# Patient Record
Sex: Female | Born: 1950 | Race: White | Hispanic: No | Smoking: Former smoker
Health system: Southern US, Community
[De-identification: ages and names within clinical notes are randomized; demographics above are authoritative.]

## PROBLEM LIST (undated history)

## (undated) ENCOUNTER — Emergency Department (HOSPITAL_BASED_OUTPATIENT_CLINIC_OR_DEPARTMENT_OTHER): Admission: EM | Payer: Medicare Other | Source: Home / Self Care

## (undated) DIAGNOSIS — I2510 Atherosclerotic heart disease of native coronary artery without angina pectoris: Secondary | ICD-10-CM

## (undated) DIAGNOSIS — K5790 Diverticulosis of intestine, part unspecified, without perforation or abscess without bleeding: Secondary | ICD-10-CM

## (undated) DIAGNOSIS — G8929 Other chronic pain: Secondary | ICD-10-CM

## (undated) DIAGNOSIS — S83209A Unspecified tear of unspecified meniscus, current injury, unspecified knee, initial encounter: Secondary | ICD-10-CM

## (undated) DIAGNOSIS — M797 Fibromyalgia: Secondary | ICD-10-CM

## (undated) DIAGNOSIS — M549 Dorsalgia, unspecified: Secondary | ICD-10-CM

## (undated) DIAGNOSIS — M1990 Unspecified osteoarthritis, unspecified site: Secondary | ICD-10-CM

## (undated) DIAGNOSIS — M25511 Pain in right shoulder: Principal | ICD-10-CM

## (undated) DIAGNOSIS — K5792 Diverticulitis of intestine, part unspecified, without perforation or abscess without bleeding: Secondary | ICD-10-CM

## (undated) DIAGNOSIS — E785 Hyperlipidemia, unspecified: Secondary | ICD-10-CM

## (undated) DIAGNOSIS — I10 Essential (primary) hypertension: Secondary | ICD-10-CM

## (undated) DIAGNOSIS — K224 Dyskinesia of esophagus: Secondary | ICD-10-CM

## (undated) DIAGNOSIS — I700 Atherosclerosis of aorta: Secondary | ICD-10-CM

## (undated) HISTORY — DX: Unspecified osteoarthritis, unspecified site: M19.90

## (undated) HISTORY — DX: Other chronic pain: G89.29

## (undated) HISTORY — DX: Dyskinesia of esophagus: K22.4

## (undated) HISTORY — PX: LUMBAR FUSION: SHX111

## (undated) HISTORY — DX: Diverticulitis of intestine, part unspecified, without perforation or abscess without bleeding: K57.92

## (undated) HISTORY — DX: Unspecified tear of unspecified meniscus, current injury, unspecified knee, initial encounter: S83.209A

## (undated) HISTORY — DX: Dorsalgia, unspecified: M54.9

## (undated) HISTORY — DX: Atherosclerosis of aorta: I70.0

## (undated) HISTORY — PX: TONSILLECTOMY AND ADENOIDECTOMY: SUR1326

## (undated) HISTORY — PX: ABDOMINAL HYSTERECTOMY: SHX81

## (undated) HISTORY — DX: Fibromyalgia: M79.7

## (undated) HISTORY — DX: Atherosclerotic heart disease of native coronary artery without angina pectoris: I25.10

## (undated) HISTORY — DX: Hyperlipidemia, unspecified: E78.5

## (undated) HISTORY — DX: Pain in right shoulder: M25.511

## (undated) HISTORY — PX: KNEE SURGERY: SHX244

## (undated) HISTORY — DX: Diverticulosis of intestine, part unspecified, without perforation or abscess without bleeding: K57.90

## (undated) HISTORY — PX: SHOULDER ARTHROSCOPY: SHX128

## (undated) HISTORY — DX: Essential (primary) hypertension: I10

---

## 2015-12-12 DIAGNOSIS — M4316 Spondylolisthesis, lumbar region: Secondary | ICD-10-CM | POA: Diagnosis not present

## 2016-01-17 DIAGNOSIS — M4316 Spondylolisthesis, lumbar region: Secondary | ICD-10-CM | POA: Diagnosis not present

## 2016-01-17 DIAGNOSIS — Z981 Arthrodesis status: Secondary | ICD-10-CM | POA: Diagnosis not present

## 2016-01-21 DIAGNOSIS — Z981 Arthrodesis status: Secondary | ICD-10-CM | POA: Diagnosis not present

## 2016-01-21 DIAGNOSIS — M4316 Spondylolisthesis, lumbar region: Secondary | ICD-10-CM | POA: Diagnosis not present

## 2016-01-30 DIAGNOSIS — J45909 Unspecified asthma, uncomplicated: Secondary | ICD-10-CM | POA: Diagnosis not present

## 2016-01-30 DIAGNOSIS — F341 Dysthymic disorder: Secondary | ICD-10-CM | POA: Diagnosis not present

## 2016-01-30 DIAGNOSIS — M4326 Fusion of spine, lumbar region: Secondary | ICD-10-CM | POA: Diagnosis not present

## 2016-02-25 DIAGNOSIS — M545 Low back pain: Secondary | ICD-10-CM | POA: Diagnosis not present

## 2016-02-25 DIAGNOSIS — M47816 Spondylosis without myelopathy or radiculopathy, lumbar region: Secondary | ICD-10-CM | POA: Diagnosis not present

## 2016-03-05 DIAGNOSIS — F341 Dysthymic disorder: Secondary | ICD-10-CM | POA: Diagnosis not present

## 2016-03-05 DIAGNOSIS — M4316 Spondylolisthesis, lumbar region: Secondary | ICD-10-CM | POA: Diagnosis not present

## 2016-03-25 DIAGNOSIS — M5136 Other intervertebral disc degeneration, lumbar region: Secondary | ICD-10-CM | POA: Diagnosis not present

## 2016-04-10 DIAGNOSIS — Z01812 Encounter for preprocedural laboratory examination: Secondary | ICD-10-CM | POA: Diagnosis not present

## 2016-04-15 DIAGNOSIS — M5126 Other intervertebral disc displacement, lumbar region: Secondary | ICD-10-CM | POA: Diagnosis not present

## 2016-04-15 DIAGNOSIS — Z981 Arthrodesis status: Secondary | ICD-10-CM | POA: Diagnosis not present

## 2016-04-15 DIAGNOSIS — M47816 Spondylosis without myelopathy or radiculopathy, lumbar region: Secondary | ICD-10-CM | POA: Diagnosis not present

## 2016-04-15 DIAGNOSIS — M5136 Other intervertebral disc degeneration, lumbar region: Secondary | ICD-10-CM | POA: Diagnosis not present

## 2016-04-22 DIAGNOSIS — M5136 Other intervertebral disc degeneration, lumbar region: Secondary | ICD-10-CM | POA: Diagnosis not present

## 2016-04-22 DIAGNOSIS — M545 Low back pain: Secondary | ICD-10-CM | POA: Diagnosis not present

## 2016-04-30 DIAGNOSIS — R5383 Other fatigue: Secondary | ICD-10-CM | POA: Diagnosis not present

## 2016-04-30 DIAGNOSIS — M5416 Radiculopathy, lumbar region: Secondary | ICD-10-CM | POA: Diagnosis not present

## 2016-05-01 DIAGNOSIS — R5383 Other fatigue: Secondary | ICD-10-CM | POA: Diagnosis not present

## 2016-05-01 DIAGNOSIS — Z13220 Encounter for screening for lipoid disorders: Secondary | ICD-10-CM | POA: Diagnosis not present

## 2016-05-01 DIAGNOSIS — E785 Hyperlipidemia, unspecified: Secondary | ICD-10-CM | POA: Diagnosis not present

## 2016-05-02 DIAGNOSIS — R21 Rash and other nonspecific skin eruption: Secondary | ICD-10-CM | POA: Diagnosis not present

## 2016-05-26 DIAGNOSIS — N959 Unspecified menopausal and perimenopausal disorder: Secondary | ICD-10-CM | POA: Diagnosis not present

## 2016-05-26 DIAGNOSIS — E785 Hyperlipidemia, unspecified: Secondary | ICD-10-CM | POA: Diagnosis not present

## 2016-05-26 DIAGNOSIS — R5383 Other fatigue: Secondary | ICD-10-CM | POA: Diagnosis not present

## 2016-05-26 DIAGNOSIS — M5416 Radiculopathy, lumbar region: Secondary | ICD-10-CM | POA: Diagnosis not present

## 2016-07-04 DIAGNOSIS — Z13220 Encounter for screening for lipoid disorders: Secondary | ICD-10-CM | POA: Diagnosis not present

## 2016-07-04 DIAGNOSIS — N959 Unspecified menopausal and perimenopausal disorder: Secondary | ICD-10-CM | POA: Diagnosis not present

## 2016-07-04 DIAGNOSIS — M8589 Other specified disorders of bone density and structure, multiple sites: Secondary | ICD-10-CM | POA: Diagnosis not present

## 2016-07-04 DIAGNOSIS — E559 Vitamin D deficiency, unspecified: Secondary | ICD-10-CM | POA: Diagnosis not present

## 2016-07-04 DIAGNOSIS — E785 Hyperlipidemia, unspecified: Secondary | ICD-10-CM | POA: Diagnosis not present

## 2016-07-04 DIAGNOSIS — Z780 Asymptomatic menopausal state: Secondary | ICD-10-CM | POA: Diagnosis not present

## 2016-07-07 DIAGNOSIS — E559 Vitamin D deficiency, unspecified: Secondary | ICD-10-CM | POA: Diagnosis not present

## 2016-07-07 DIAGNOSIS — M4326 Fusion of spine, lumbar region: Secondary | ICD-10-CM | POA: Diagnosis not present

## 2016-07-07 DIAGNOSIS — F341 Dysthymic disorder: Secondary | ICD-10-CM | POA: Diagnosis not present

## 2016-09-01 DIAGNOSIS — M4316 Spondylolisthesis, lumbar region: Secondary | ICD-10-CM | POA: Diagnosis not present

## 2016-09-01 DIAGNOSIS — K219 Gastro-esophageal reflux disease without esophagitis: Secondary | ICD-10-CM | POA: Diagnosis not present

## 2016-09-23 DIAGNOSIS — M5416 Radiculopathy, lumbar region: Secondary | ICD-10-CM | POA: Diagnosis not present

## 2016-09-23 DIAGNOSIS — M797 Fibromyalgia: Secondary | ICD-10-CM | POA: Diagnosis not present

## 2016-09-23 DIAGNOSIS — M4326 Fusion of spine, lumbar region: Secondary | ICD-10-CM | POA: Diagnosis not present

## 2016-10-13 DIAGNOSIS — F4320 Adjustment disorder, unspecified: Secondary | ICD-10-CM | POA: Diagnosis not present

## 2016-10-29 DIAGNOSIS — F4320 Adjustment disorder, unspecified: Secondary | ICD-10-CM | POA: Diagnosis not present

## 2016-11-11 DIAGNOSIS — M5416 Radiculopathy, lumbar region: Secondary | ICD-10-CM | POA: Diagnosis not present

## 2016-11-11 DIAGNOSIS — B351 Tinea unguium: Secondary | ICD-10-CM | POA: Diagnosis not present

## 2016-12-01 DIAGNOSIS — S42201A Unspecified fracture of upper end of right humerus, initial encounter for closed fracture: Secondary | ICD-10-CM | POA: Diagnosis not present

## 2016-12-01 DIAGNOSIS — Z90710 Acquired absence of both cervix and uterus: Secondary | ICD-10-CM | POA: Diagnosis not present

## 2016-12-01 DIAGNOSIS — I739 Peripheral vascular disease, unspecified: Secondary | ICD-10-CM | POA: Diagnosis not present

## 2016-12-01 DIAGNOSIS — Z888 Allergy status to other drugs, medicaments and biological substances status: Secondary | ICD-10-CM | POA: Diagnosis not present

## 2016-12-01 DIAGNOSIS — S40911A Unspecified superficial injury of right shoulder, initial encounter: Secondary | ICD-10-CM | POA: Diagnosis not present

## 2016-12-01 DIAGNOSIS — Z79891 Long term (current) use of opiate analgesic: Secondary | ICD-10-CM | POA: Diagnosis not present

## 2016-12-01 DIAGNOSIS — K5792 Diverticulitis of intestine, part unspecified, without perforation or abscess without bleeding: Secondary | ICD-10-CM | POA: Diagnosis not present

## 2016-12-01 DIAGNOSIS — W000XXA Fall on same level due to ice and snow, initial encounter: Secondary | ICD-10-CM | POA: Diagnosis not present

## 2016-12-01 DIAGNOSIS — M797 Fibromyalgia: Secondary | ICD-10-CM | POA: Diagnosis not present

## 2016-12-01 DIAGNOSIS — W009XXA Unspecified fall due to ice and snow, initial encounter: Secondary | ICD-10-CM | POA: Diagnosis not present

## 2016-12-01 DIAGNOSIS — G8929 Other chronic pain: Secondary | ICD-10-CM | POA: Diagnosis not present

## 2016-12-01 DIAGNOSIS — Z79899 Other long term (current) drug therapy: Secondary | ICD-10-CM | POA: Diagnosis not present

## 2016-12-01 DIAGNOSIS — E7800 Pure hypercholesterolemia, unspecified: Secondary | ICD-10-CM | POA: Diagnosis not present

## 2016-12-01 DIAGNOSIS — F17200 Nicotine dependence, unspecified, uncomplicated: Secondary | ICD-10-CM | POA: Diagnosis not present

## 2016-12-03 DIAGNOSIS — S42351A Displaced comminuted fracture of shaft of humerus, right arm, initial encounter for closed fracture: Secondary | ICD-10-CM | POA: Diagnosis not present

## 2016-12-03 DIAGNOSIS — S42291A Other displaced fracture of upper end of right humerus, initial encounter for closed fracture: Secondary | ICD-10-CM | POA: Diagnosis not present

## 2016-12-03 DIAGNOSIS — M79621 Pain in right upper arm: Secondary | ICD-10-CM | POA: Diagnosis not present

## 2016-12-04 DIAGNOSIS — S42211A Unspecified displaced fracture of surgical neck of right humerus, initial encounter for closed fracture: Secondary | ICD-10-CM | POA: Diagnosis not present

## 2016-12-04 DIAGNOSIS — G8918 Other acute postprocedural pain: Secondary | ICD-10-CM | POA: Diagnosis not present

## 2016-12-04 DIAGNOSIS — J45909 Unspecified asthma, uncomplicated: Secondary | ICD-10-CM | POA: Diagnosis not present

## 2016-12-04 DIAGNOSIS — Z888 Allergy status to other drugs, medicaments and biological substances status: Secondary | ICD-10-CM | POA: Diagnosis not present

## 2016-12-04 DIAGNOSIS — Z87891 Personal history of nicotine dependence: Secondary | ICD-10-CM | POA: Diagnosis not present

## 2016-12-04 DIAGNOSIS — S42201A Unspecified fracture of upper end of right humerus, initial encounter for closed fracture: Secondary | ICD-10-CM | POA: Diagnosis not present

## 2016-12-15 DIAGNOSIS — S4291XA Fracture of right shoulder girdle, part unspecified, initial encounter for closed fracture: Secondary | ICD-10-CM | POA: Diagnosis not present

## 2016-12-15 DIAGNOSIS — S42301A Unspecified fracture of shaft of humerus, right arm, initial encounter for closed fracture: Secondary | ICD-10-CM | POA: Diagnosis not present

## 2016-12-22 DIAGNOSIS — Z01818 Encounter for other preprocedural examination: Secondary | ICD-10-CM | POA: Diagnosis not present

## 2016-12-24 DIAGNOSIS — S42351A Displaced comminuted fracture of shaft of humerus, right arm, initial encounter for closed fracture: Secondary | ICD-10-CM | POA: Diagnosis not present

## 2016-12-24 DIAGNOSIS — E559 Vitamin D deficiency, unspecified: Secondary | ICD-10-CM | POA: Diagnosis not present

## 2016-12-25 DIAGNOSIS — E7800 Pure hypercholesterolemia, unspecified: Secondary | ICD-10-CM | POA: Diagnosis not present

## 2016-12-25 DIAGNOSIS — Z888 Allergy status to other drugs, medicaments and biological substances status: Secondary | ICD-10-CM | POA: Diagnosis not present

## 2016-12-25 DIAGNOSIS — Z8541 Personal history of malignant neoplasm of cervix uteri: Secondary | ICD-10-CM | POA: Diagnosis not present

## 2016-12-25 DIAGNOSIS — Z87891 Personal history of nicotine dependence: Secondary | ICD-10-CM | POA: Diagnosis not present

## 2016-12-25 DIAGNOSIS — T8484XA Pain due to internal orthopedic prosthetic devices, implants and grafts, initial encounter: Secondary | ICD-10-CM | POA: Diagnosis not present

## 2016-12-25 DIAGNOSIS — I10 Essential (primary) hypertension: Secondary | ICD-10-CM | POA: Diagnosis not present

## 2016-12-25 DIAGNOSIS — J45909 Unspecified asthma, uncomplicated: Secondary | ICD-10-CM | POA: Diagnosis not present

## 2016-12-25 DIAGNOSIS — Z472 Encounter for removal of internal fixation device: Secondary | ICD-10-CM | POA: Diagnosis not present

## 2016-12-31 DIAGNOSIS — S42351A Displaced comminuted fracture of shaft of humerus, right arm, initial encounter for closed fracture: Secondary | ICD-10-CM | POA: Diagnosis not present

## 2017-01-19 DIAGNOSIS — M4316 Spondylolisthesis, lumbar region: Secondary | ICD-10-CM | POA: Diagnosis not present

## 2017-01-19 DIAGNOSIS — H9193 Unspecified hearing loss, bilateral: Secondary | ICD-10-CM | POA: Diagnosis not present

## 2017-01-19 DIAGNOSIS — J3489 Other specified disorders of nose and nasal sinuses: Secondary | ICD-10-CM | POA: Diagnosis not present

## 2017-01-29 DIAGNOSIS — E7800 Pure hypercholesterolemia, unspecified: Secondary | ICD-10-CM | POA: Diagnosis not present

## 2017-01-29 DIAGNOSIS — Z8589 Personal history of malignant neoplasm of other organs and systems: Secondary | ICD-10-CM | POA: Diagnosis not present

## 2017-01-29 DIAGNOSIS — T84498A Other mechanical complication of other internal orthopedic devices, implants and grafts, initial encounter: Secondary | ICD-10-CM | POA: Diagnosis not present

## 2017-01-29 DIAGNOSIS — S42291A Other displaced fracture of upper end of right humerus, initial encounter for closed fracture: Secondary | ICD-10-CM | POA: Diagnosis not present

## 2017-01-29 DIAGNOSIS — Z87891 Personal history of nicotine dependence: Secondary | ICD-10-CM | POA: Diagnosis not present

## 2017-01-29 DIAGNOSIS — Z888 Allergy status to other drugs, medicaments and biological substances status: Secondary | ICD-10-CM | POA: Diagnosis not present

## 2017-01-29 DIAGNOSIS — J45909 Unspecified asthma, uncomplicated: Secondary | ICD-10-CM | POA: Diagnosis not present

## 2017-01-29 DIAGNOSIS — T8484XA Pain due to internal orthopedic prosthetic devices, implants and grafts, initial encounter: Secondary | ICD-10-CM | POA: Diagnosis not present

## 2017-02-13 DIAGNOSIS — J45909 Unspecified asthma, uncomplicated: Secondary | ICD-10-CM | POA: Diagnosis not present

## 2017-02-13 DIAGNOSIS — S42351A Displaced comminuted fracture of shaft of humerus, right arm, initial encounter for closed fracture: Secondary | ICD-10-CM | POA: Diagnosis not present

## 2017-02-13 DIAGNOSIS — E785 Hyperlipidemia, unspecified: Secondary | ICD-10-CM | POA: Diagnosis not present

## 2017-02-13 DIAGNOSIS — M4316 Spondylolisthesis, lumbar region: Secondary | ICD-10-CM | POA: Diagnosis not present

## 2017-02-17 DIAGNOSIS — S42353A Displaced comminuted fracture of shaft of humerus, unspecified arm, initial encounter for closed fracture: Secondary | ICD-10-CM | POA: Diagnosis not present

## 2017-02-25 DIAGNOSIS — Z1231 Encounter for screening mammogram for malignant neoplasm of breast: Secondary | ICD-10-CM | POA: Diagnosis not present

## 2017-02-26 DIAGNOSIS — S42353A Displaced comminuted fracture of shaft of humerus, unspecified arm, initial encounter for closed fracture: Secondary | ICD-10-CM | POA: Diagnosis not present

## 2017-02-27 DIAGNOSIS — M4326 Fusion of spine, lumbar region: Secondary | ICD-10-CM | POA: Diagnosis not present

## 2017-02-27 DIAGNOSIS — S42351A Displaced comminuted fracture of shaft of humerus, right arm, initial encounter for closed fracture: Secondary | ICD-10-CM | POA: Diagnosis not present

## 2017-03-23 DIAGNOSIS — M4326 Fusion of spine, lumbar region: Secondary | ICD-10-CM | POA: Diagnosis not present

## 2017-03-23 DIAGNOSIS — H5203 Hypermetropia, bilateral: Secondary | ICD-10-CM | POA: Diagnosis not present

## 2017-03-23 DIAGNOSIS — F341 Dysthymic disorder: Secondary | ICD-10-CM | POA: Diagnosis not present

## 2017-03-23 DIAGNOSIS — H2513 Age-related nuclear cataract, bilateral: Secondary | ICD-10-CM | POA: Diagnosis not present

## 2017-03-23 DIAGNOSIS — S42351A Displaced comminuted fracture of shaft of humerus, right arm, initial encounter for closed fracture: Secondary | ICD-10-CM | POA: Diagnosis not present

## 2017-03-23 DIAGNOSIS — H524 Presbyopia: Secondary | ICD-10-CM | POA: Diagnosis not present

## 2017-04-17 ENCOUNTER — Telehealth: Payer: Self-pay | Admitting: *Deleted

## 2017-04-17 NOTE — Telephone Encounter (Signed)
PreVisit Call attempted. Pt states she will call back in 1 hr.

## 2017-04-20 ENCOUNTER — Ambulatory Visit: Payer: Self-pay | Admitting: Family Medicine

## 2017-04-27 ENCOUNTER — Ambulatory Visit: Payer: Self-pay | Admitting: Family Medicine

## 2017-05-18 ENCOUNTER — Ambulatory Visit: Payer: Self-pay | Admitting: Family Medicine

## 2017-05-22 ENCOUNTER — Ambulatory Visit: Payer: Medicare Other | Admitting: Family Medicine

## 2017-05-26 ENCOUNTER — Encounter: Payer: Self-pay | Admitting: Family Medicine

## 2017-05-26 ENCOUNTER — Ambulatory Visit (INDEPENDENT_AMBULATORY_CARE_PROVIDER_SITE_OTHER): Payer: Medicare Other | Admitting: Family Medicine

## 2017-05-26 VITALS — BP 118/72 | HR 63 | Temp 98.5°F | Ht 64.0 in | Wt 112.4 lb

## 2017-05-26 DIAGNOSIS — M25511 Pain in right shoulder: Secondary | ICD-10-CM

## 2017-05-26 DIAGNOSIS — E7800 Pure hypercholesterolemia, unspecified: Secondary | ICD-10-CM

## 2017-05-26 DIAGNOSIS — G8929 Other chronic pain: Secondary | ICD-10-CM | POA: Diagnosis not present

## 2017-05-26 DIAGNOSIS — W57XXXA Bitten or stung by nonvenomous insect and other nonvenomous arthropods, initial encounter: Secondary | ICD-10-CM

## 2017-05-26 MED ORDER — TRIAMCINOLONE ACETONIDE 0.5 % EX OINT: 1.0000 "application " | TOPICAL_OINTMENT | Freq: Two times a day (BID) | CUTANEOUS | 0 refills | Status: DC

## 2017-05-26 MED ORDER — HYDROCODONE-ACETAMINOPHEN 10-325 MG PO TABS: 1.0000 | ORAL_TABLET | Freq: Four times a day (QID) | ORAL | 0 refills | Status: DC | PRN

## 2017-05-26 NOTE — Progress Notes (Signed)
Patricia Underwood is a 66 y.o. female is here to [facility] CARE.   Patient Care Team: Helane Rima, DO as PCP - General (Family Medicine)   History of Present Illness:   Patricia Underwood, CMA, acting as scribe for [name].  HPI: See assessment and plan for problem based charting.  Health Maintenance Due  Topic Date Due  . Hepatitis C Screening  1951-01-07  . TETANUS/TDAP  01/02/1970  . MAMMOGRAM  01/02/2001  . COLONOSCOPY  01/02/2001  . DEXA SCAN  01/03/2016  . PNA vac Low Risk Adult (1 of 2 - PCV13) 01/03/2016   Depression screen PHQ 2/9 05/26/2017  Decreased Interest 0  Down, Depressed, Hopeless 0  PHQ - 2 Score 0   PMHx, SurgHx, SocialHx, Medications, and Allergies were reviewed in the Visit Navigator and updated as appropriate.   Past Medical History:  Diagnosis Date  . Chronic right shoulder pain 05/30/2017  . HLD (hyperlipidemia) 05/30/2017   History reviewed. No pertinent surgical history. History reviewed. No pertinent family history.   Social History  Substance Use Topics  . Smoking status: Former Games developer  . Smokeless tobacco: Former Neurosurgeon  . Alcohol use 4.2 oz/week    7 Glasses of wine per week   Current Medications and Allergies:   .  albuterol (PROVENTIL HFA;VENTOLIN HFA) 108 (90 Base) MCG/ACT inhaler, Inhale 1 puff into the lungs as needed for wheezing or shortness of breath., Disp: , Rfl:  .  atorvastatin (LIPITOR) 40 MG tablet, Take 40 mg by mouth daily., Disp: , Rfl:  .  cholecalciferol (VITAMIN D) 1000 units tablet, Take 2,000 Units by mouth daily., Disp: , Rfl:   Allergies  Allergen Reactions  . Flexeril [Cyclobenzaprine] Other (See Comments)    Patient unsure of reaction  . Naproxen Other (See Comments)    Patient unsure of reaction.   Review of Systems:   Pertinent items are noted in the HPI. Otherwise, ROS is negative.  Vitals:   Vitals:   05/26/17 1629  BP: 118/72  Pulse: 63  Temp: 98.5 F (36.9 C)  TempSrc: Oral  SpO2: 97%  Weight:  112 lb 6.4 oz (51 kg)  Height: 5\' 4"  (1.626 m)     Body mass index is 19.29 kg/m.  Physical Exam:   Physical Exam  Constitutional: She appears well-developed and well-nourished. No distress.  HENT:  Head: Normocephalic and atraumatic.  Eyes: Pupils are equal, round, and reactive to light. EOM are normal.  Neck: Normal range of motion. Neck supple.  Cardiovascular: Normal rate, regular rhythm, normal heart sounds and intact distal pulses.   Pulmonary/Chest: Effort normal.  Abdominal: Soft.  Skin: Skin is warm.  Multiple excoriated bug bites on her forearms. No signs of infection.  Psychiatric: She has a normal mood and affect. Her behavior is normal.  Nursing note and vitals reviewed.  Assessment and Plan:   [name] was seen today for establish care.  Diagnoses and all orders for this visit:  Chronic right shoulder pain Comments:  Patient complains of right shoulder pain. The symptoms began several years ago. Pain is located diffusely throughout the shoulder. Discomfort is described as aching and throbbing. Symptoms are exacerbated by repetitive movements, overhead movements and lying on the shoulder. Evaluation to date: previously followed by Orthopedics and would like another referral. Therapy to date includes: opioids which are effective.  Controlled.  No signs of complications, medication side effects, or red flags.    Continue current regimen. REVIEW OF PAIN MEDICATION CONTRACT WITH PATIENT:  We had a long discussion about chronic pain medications, the risks, benefits, potential side effects of medication.  We reviewed her pain contract in detail.  The patient understands the contract and is willing to abide by it.  She has signed this and we will scan it into her record. She understands that she must take the medication as prescribed, she cannot obtain narcotic medication from any other source, she is not to request refills early, she is not to escalate the dose without a  discussion.    Orders: -     AMB referral to orthopedics -     HYDROcodone-acetaminophen (NORCO) 10-325 MG tablet; Take 1 tablet by mouth 4 (four) times daily as needed. -     HYDROcodone-acetaminophen (NORCO) 10-325 MG tablet; Take 1 tablet by mouth 4 (four) times daily as needed. -     HYDROcodone-acetaminophen (NORCO) 10-325 MG tablet; Take 1 tablet by mouth 4 (four) times daily as needed.  Bug bite, initial encounter -     triamcinolone ointment (KENALOG) 0.5 %; Apply 1 application topically 2 (two) times daily.  Pure hypercholesterolemia Comments: No signs of complications, medication side effects, or red flags.  Continue current regimen.     . Reviewed expectations re: course of current medical issues. . Discussed self-management of symptoms. . Outlined signs and symptoms indicating need for more acute intervention. . Patient verbalized understanding and all questions were answered. [name] Health Maintenance issues including appropriate healthy diet, exercise, and smoking avoidance were discussed with patient. . See orders for this visit as documented in the electronic medical record. . Patient received an After Visit Summary.  CMA served as Neurosurgeon during this visit. History, Physical, and Plan performed by medical provider. The above documentation has been reviewed and is accurate and complete. Helane Rima, D.O.  Records requested if needed. Time spent with the patient: 30 minutes, of which >50% was spent in obtaining information about her symptoms, reviewing her previous labs, evaluations, and treatments, counseling her about her condition (please see the discussed topics above), and developing a plan to further investigate it; she had a number of questions which I addressed. We'll request records and review.  Helane Rima, DO [place] [place] 05/31/2017  Future Appointments Date Time Provider Department Center  08/26/2017 10:00 AM Helane Rima, DO [facility] None

## 2017-05-30 ENCOUNTER — Encounter: Payer: Self-pay | Admitting: Family Medicine

## 2017-05-30 DIAGNOSIS — G8929 Other chronic pain: Secondary | ICD-10-CM | POA: Insufficient documentation

## 2017-05-30 DIAGNOSIS — E785 Hyperlipidemia, unspecified: Secondary | ICD-10-CM | POA: Insufficient documentation

## 2017-05-30 DIAGNOSIS — M25511 Pain in right shoulder: Principal | ICD-10-CM

## 2017-05-30 HISTORY — DX: Hyperlipidemia, unspecified: E78.5

## 2017-05-30 HISTORY — DX: Other chronic pain: G89.29

## 2017-05-31 ENCOUNTER — Encounter: Payer: Self-pay | Admitting: Family Medicine

## 2017-06-26 ENCOUNTER — Ambulatory Visit: Payer: Medicare Other | Admitting: Family Medicine

## 2017-06-29 ENCOUNTER — Encounter: Payer: Self-pay | Admitting: Family Medicine

## 2017-06-29 ENCOUNTER — Ambulatory Visit (INDEPENDENT_AMBULATORY_CARE_PROVIDER_SITE_OTHER): Payer: Medicare Other | Admitting: Family Medicine

## 2017-06-29 VITALS — BP 142/78 | HR 64 | Temp 98.1°F | Ht 64.0 in | Wt 120.6 lb

## 2017-06-29 DIAGNOSIS — E538 Deficiency of other specified B group vitamins: Secondary | ICD-10-CM

## 2017-06-29 DIAGNOSIS — E559 Vitamin D deficiency, unspecified: Secondary | ICD-10-CM | POA: Diagnosis not present

## 2017-06-29 DIAGNOSIS — G4700 Insomnia, unspecified: Secondary | ICD-10-CM | POA: Diagnosis not present

## 2017-06-29 DIAGNOSIS — Z13220 Encounter for screening for lipoid disorders: Secondary | ICD-10-CM

## 2017-06-29 DIAGNOSIS — R5383 Other fatigue: Secondary | ICD-10-CM

## 2017-06-29 DIAGNOSIS — R7989 Other specified abnormal findings of blood chemistry: Secondary | ICD-10-CM

## 2017-06-29 MED ORDER — GABAPENTIN 100 MG PO CAPS: 100.0000 mg | ORAL_CAPSULE | Freq: Every day | ORAL | 0 refills | Status: DC

## 2017-06-29 NOTE — Progress Notes (Signed)
Patricia Underwood is a 66 y.o. female is here to [facility] CARE.   Patient Care Team: Helane Rima, DO as PCP - General (Family Medicine)   History of Present Illness:   [name] CMA acting as scribe for [name].  HPI: Patient comes in today for follow up of her fatigue. See Assessment and Plan section for Problem Based Charting of further issues discussed today.  Health Maintenance Due  Topic Date Due  . Hepatitis C Screening  18-Jul-1951  . TETANUS/TDAP  01/02/1970  . MAMMOGRAM  01/02/2001  . COLONOSCOPY  01/02/2001  . DEXA SCAN  01/03/2016  . PNA vac Low Risk Adult (1 of 2 - PCV13) 01/03/2016  . INFLUENZA VACCINE  06/10/2017   Depression screen PHQ 2/9 05/26/2017  Decreased Interest 0  Down, Depressed, Hopeless 0  PHQ - 2 Score 0   PMHx, SurgHx, SocialHx, Medications, and Allergies were reviewed in the Visit Navigator and updated as appropriate.   Past Medical History:  Diagnosis Date  . Chronic right shoulder pain 05/30/2017  . HLD (hyperlipidemia) 05/30/2017   No past surgical history on file. No family history on file.   Social History  Substance Use Topics  . Smoking status: Former Games developer  . Smokeless tobacco: Former Neurosurgeon  . Alcohol use 4.2 oz/week    7 Glasses of wine per week   Current Medications and Allergies:   .  albuterol (PROVENTIL HFA;VENTOLIN HFA) 108 (90 Base) MCG/ACT inhaler, Inhale 1 puff into the lungs as needed for wheezing or shortness of breath., Disp: , Rfl:  .  cholecalciferol (VITAMIN D) 1000 units tablet, Take 2,000 Units by mouth daily., Disp: , Rfl:  .  HYDROcodone-acetaminophen (NORCO) 10-325 MG tablet, Take 1 tablet by mouth 4 (four) times daily as needed., Disp: 120 tablet, Rfl: 0 .  triamcinolone ointment (KENALOG) 0.5 %, Apply 1 application topically 2 (two) times daily., Disp: 30 g, Rfl: 0 .  atorvastatin (LIPITOR) 40 MG tablet, Take 40 mg by mouth daily., Disp: , Rfl:   Allergies  Allergen Reactions  . Ambien [Zolpidem  Tartrate]   . Flexeril [Cyclobenzaprine] Other (See Comments)    Patient unsure of reaction  . Naproxen Other (See Comments)    Patient unsure of reaction.   Review of Systems:   Pertinent items are noted in the HPI. Otherwise, ROS is negative.  Vitals:   Vitals:   06/29/17 1415  BP: (!) 142/78  Pulse: 64  Temp: 98.1 F (36.7 C)  TempSrc: Oral  SpO2: 98%  Weight: 120 lb 9.6 oz (54.7 kg)  Height: 5\' 4"  (1.626 m)     Body mass index is 20.7 kg/m.   Physical Exam:   Physical Exam  Constitutional: She appears well-nourished.  HENT:  Head: Normocephalic and atraumatic.  Eyes: Pupils are equal, round, and reactive to light. EOM are normal.  Neck: Normal range of motion. Neck supple.  Cardiovascular: Normal rate, regular rhythm, normal heart sounds and intact distal pulses.   Pulmonary/Chest: Effort normal.  Abdominal: Soft.  Skin: Skin is warm.  Psychiatric: She has a normal mood and affect. Her behavior is normal.  Nursing note and vitals reviewed.  Assessment and Plan:   Fatigue: Patient stated that she has to go to bed at 8 PM due to being so tired. She wakes up a few times, then gets up about 4 AM for the day.  Vitamin D: Patient had Vitamin D checked in January and it was a 6. She was on  the high dose Vitamin D and changed to 2000 units of vitamin D. We will check Vitamin D levels.    Sleep disorder: Patient has tried Ambien in the past and she would sleep walk on this. She fell down a flight of stairs with this. Doesn't not want an antidepressant for sleep. Will try Gabapentin 100 Mg nightly to help with sleep and pain.    Patricia Underwood was seen today for fatigue.  Diagnoses and all orders for this visit:  Fatigue, unspecified type -     CBC with Differential/Platelet; Future -     Comprehensive metabolic panel; Future -     Urinalysis, Routine w reflex microscopic; Future -     Vitamin B12; Future -     VITAMIN D 25 Hydroxy (Vit-D Deficiency, Fractures); Future -      Sedimentation rate; Future -     ANA; Future -     Rheumatoid factor; Future -     C-reactive protein; Future  Lipid screening -     Lipid panel; Future  Low vitamin D level -     VITAMIN D 25 Hydroxy (Vit-D Deficiency, Fractures); Future  B12 deficiency -     Vitamin B12; Future  Other orders -     gabapentin (NEURONTIN) 100 MG capsule; Take 1 capsule (100 mg total) by mouth at bedtime.    . Reviewed expectations re: course of current medical issues. . Discussed self-management of symptoms. . Outlined signs and symptoms indicating need for more acute intervention. . Patient verbalized understanding and all questions were answered. [name] Health Maintenance issues including appropriate healthy diet, exercise, and smoking avoidance were discussed with patient. . See orders for this visit as documented in the electronic medical record. . Patient received an After Visit Summary.  CMA served as Neurosurgeon during this visit. History, Physical, and Plan performed by medical provider. The above documentation has been reviewed and is accurate and complete. Helane Rima, D.O.  Helane Rima, DO [place] [place] 07/05/2017  Future Appointments Date Time Provider Department Center  07/07/2017 9:00 AM [facility] None  07/08/2017 10:45 AM Cammy Copa, MD [facility] None  07/10/2017 11:00 AM Helane Rima, DO [facility] None  08/26/2017 10:00 AM Helane Rima, DO [facility] None

## 2017-07-01 ENCOUNTER — Ambulatory Visit: Payer: Medicare Other | Admitting: Family Medicine

## 2017-07-05 DIAGNOSIS — E538 Deficiency of other specified B group vitamins: Secondary | ICD-10-CM

## 2017-07-05 DIAGNOSIS — E559 Vitamin D deficiency, unspecified: Secondary | ICD-10-CM | POA: Insufficient documentation

## 2017-07-05 DIAGNOSIS — R7989 Other specified abnormal findings of blood chemistry: Secondary | ICD-10-CM | POA: Insufficient documentation

## 2017-07-05 DIAGNOSIS — G4700 Insomnia, unspecified: Secondary | ICD-10-CM | POA: Insufficient documentation

## 2017-07-05 HISTORY — DX: Deficiency of other specified B group vitamins: E53.8

## 2017-07-06 ENCOUNTER — Other Ambulatory Visit: Payer: Medicare Other

## 2017-07-07 ENCOUNTER — Other Ambulatory Visit (INDEPENDENT_AMBULATORY_CARE_PROVIDER_SITE_OTHER): Payer: Medicare Other

## 2017-07-07 DIAGNOSIS — Z13220 Encounter for screening for lipoid disorders: Secondary | ICD-10-CM | POA: Diagnosis not present

## 2017-07-07 DIAGNOSIS — E538 Deficiency of other specified B group vitamins: Secondary | ICD-10-CM | POA: Diagnosis not present

## 2017-07-07 DIAGNOSIS — E559 Vitamin D deficiency, unspecified: Secondary | ICD-10-CM

## 2017-07-07 DIAGNOSIS — R5383 Other fatigue: Secondary | ICD-10-CM

## 2017-07-07 DIAGNOSIS — R7989 Other specified abnormal findings of blood chemistry: Secondary | ICD-10-CM

## 2017-07-07 LAB — LIPID PANEL
Cholesterol: 251 mg/dL — ABNORMAL HIGH (ref 0–200)
HDL: 71.7 mg/dL (ref >39.00)
LDL Cholesterol: 160 mg/dL — ABNORMAL HIGH (ref 0–99)
NonHDL: 179.05
Total CHOL/HDL Ratio: 3
Triglycerides: 94 mg/dL (ref 0.0–149.0)
VLDL: 18.8 mg/dL (ref 0.0–40.0)

## 2017-07-07 LAB — COMPREHENSIVE METABOLIC PANEL
ALT: 18 U/L (ref 0–35)
AST: 17 U/L (ref 0–37)
Albumin: 4.2 g/dL (ref 3.5–5.2)
Alkaline Phosphatase: 84 U/L (ref 39–117)
BUN: 18 mg/dL (ref 6–23)
CO2: 31 mEq/L (ref 19–32)
Calcium: 9.2 mg/dL (ref 8.4–10.5)
Chloride: 105 mEq/L (ref 96–112)
Creatinine, Ser: 0.57 mg/dL (ref 0.40–1.20)
GFR: 112.61 mL/min (ref >60.00)
Glucose, Bld: 116 mg/dL — ABNORMAL HIGH (ref 70–99)
Potassium: 4.9 mEq/L (ref 3.5–5.1)
Sodium: 139 mEq/L (ref 135–145)
Total Bilirubin: 0.4 mg/dL (ref 0.2–1.2)
Total Protein: 6.6 g/dL (ref 6.0–8.3)

## 2017-07-07 LAB — SEDIMENTATION RATE: Sed Rate: 6 mm/hr (ref 0–30)

## 2017-07-07 LAB — CBC WITH DIFFERENTIAL/PLATELET
Basophils Absolute: 0.1 10*3/uL (ref 0.0–0.1)
Basophils Relative: 1.1 % (ref 0.0–3.0)
Eosinophils Absolute: 0.3 10*3/uL (ref 0.0–0.7)
Eosinophils Relative: 4.2 % (ref 0.0–5.0)
HCT: 42.5 % (ref 36.0–46.0)
Hemoglobin: 13.9 g/dL (ref 12.0–15.0)
Lymphocytes Relative: 37 % (ref 12.0–46.0)
Lymphs Abs: 2.9 10*3/uL (ref 0.7–4.0)
MCHC: 32.7 g/dL (ref 30.0–36.0)
MCV: 98.5 fl (ref 78.0–100.0)
Monocytes Absolute: 0.6 10*3/uL (ref 0.1–1.0)
Monocytes Relative: 8 % (ref 3.0–12.0)
Neutro Abs: 3.9 10*3/uL (ref 1.4–7.7)
Neutrophils Relative %: 49.7 % (ref 43.0–77.0)
Platelets: 375 10*3/uL (ref 150.0–400.0)
RBC: 4.31 Mil/uL (ref 3.87–5.11)
RDW: 13.2 % (ref 11.5–15.5)
WBC: 7.9 10*3/uL (ref 4.0–10.5)

## 2017-07-07 LAB — C-REACTIVE PROTEIN: CRP: 0.1 mg/dL — ABNORMAL LOW (ref 0.5–20.0)

## 2017-07-07 LAB — VITAMIN D 25 HYDROXY (VIT D DEFICIENCY, FRACTURES): VITD: 28.1 ng/mL — ABNORMAL LOW (ref 30.00–100.00)

## 2017-07-07 LAB — VITAMIN B12: Vitamin B-12: 236 pg/mL (ref 211–911)

## 2017-07-07 NOTE — Addendum Note (Signed)
Addended by: Felix Ahmadi A on: 07/07/2017 08:57 AM   Modules accepted: Orders

## 2017-07-08 ENCOUNTER — Ambulatory Visit: Payer: Medicare Other | Admitting: Family Medicine

## 2017-07-08 ENCOUNTER — Encounter (INDEPENDENT_AMBULATORY_CARE_PROVIDER_SITE_OTHER): Payer: Self-pay | Admitting: Orthopedic Surgery

## 2017-07-08 ENCOUNTER — Ambulatory Visit (INDEPENDENT_AMBULATORY_CARE_PROVIDER_SITE_OTHER): Payer: Medicare Other

## 2017-07-08 ENCOUNTER — Ambulatory Visit (INDEPENDENT_AMBULATORY_CARE_PROVIDER_SITE_OTHER): Payer: Medicare Other | Admitting: Orthopedic Surgery

## 2017-07-08 DIAGNOSIS — M25511 Pain in right shoulder: Secondary | ICD-10-CM | POA: Diagnosis not present

## 2017-07-08 DIAGNOSIS — G8929 Other chronic pain: Secondary | ICD-10-CM

## 2017-07-08 LAB — RHEUMATOID FACTOR: Rhuematoid fact SerPl-aCnc: <14 IU/mL (ref <14)

## 2017-07-08 LAB — ANA: Anti Nuclear Antibody(ANA): NEGATIVE

## 2017-07-09 ENCOUNTER — Ambulatory Visit (INDEPENDENT_AMBULATORY_CARE_PROVIDER_SITE_OTHER): Payer: Medicare Other | Admitting: Family Medicine

## 2017-07-09 ENCOUNTER — Encounter: Payer: Self-pay | Admitting: Family Medicine

## 2017-07-09 VITALS — BP 138/86 | HR 72 | Temp 98.2°F | Ht 64.0 in | Wt 114.8 lb

## 2017-07-09 DIAGNOSIS — M25511 Pain in right shoulder: Secondary | ICD-10-CM

## 2017-07-09 DIAGNOSIS — R739 Hyperglycemia, unspecified: Secondary | ICD-10-CM | POA: Diagnosis not present

## 2017-07-09 DIAGNOSIS — G8929 Other chronic pain: Secondary | ICD-10-CM

## 2017-07-09 DIAGNOSIS — E538 Deficiency of other specified B group vitamins: Secondary | ICD-10-CM

## 2017-07-09 DIAGNOSIS — E785 Hyperlipidemia, unspecified: Secondary | ICD-10-CM

## 2017-07-09 LAB — POCT GLYCOSYLATED HEMOGLOBIN (HGB A1C): Hemoglobin A1C: 5.4

## 2017-07-09 MED ORDER — HYDROCODONE-ACETAMINOPHEN 10-325 MG PO TABS: 1.0000 | ORAL_TABLET | Freq: Four times a day (QID) | ORAL | 0 refills | Status: DC | PRN

## 2017-07-09 MED ORDER — CYANOCOBALAMIN 1000 MCG/ML IJ SOLN
1000.0000 ug | Freq: Once | INTRAMUSCULAR | Status: AC
  Administered 2017-07-09: 1000 ug via INTRAMUSCULAR

## 2017-07-09 NOTE — Progress Notes (Signed)
Patricia Underwood is a 66 y.o. female is here for follow up.  History of Present Illness:   Patricia Underwood CMA acting as scribe for [name].  HPI: Patient comes in today to follow up on her lab work. No other concerns. See below.  Health Maintenance Due  Topic Date Due  . Hepatitis C Screening  [birth year]  . TETANUS/TDAP  01/02/1970  . MAMMOGRAM  01/02/2001  . COLONOSCOPY  01/02/2001  . DEXA SCAN  01/03/2016  . PNA vac Low Risk Adult (1 of 2 - PCV13) 01/03/2016  . INFLUENZA VACCINE  06/10/2017   Depression screen PHQ 2/9 05/26/2017  Decreased Interest 0  Down, Depressed, Hopeless 0  PHQ - 2 Score 0   PMHx, SurgHx, SocialHx, FamHx, Medications, and Allergies were reviewed in the Visit Navigator and updated as appropriate.   Patient Active Problem List   Diagnosis Date Noted  . Low vitamin D level 07/05/2017  . B12 deficiency 07/05/2017  . Insomnia 07/05/2017  . Chronic right shoulder pain 05/30/2017  . HLD (hyperlipidemia) 05/30/2017   Social History  Substance Use Topics  . Smoking status: Former Games developer  . Smokeless tobacco: Former Neurosurgeon  . Alcohol use 4.2 oz/week    7 Glasses of wine per week   Current Medications and Allergies:   Current Outpatient Prescriptions:  .  albuterol (PROVENTIL HFA;VENTOLIN HFA) 108 (90 Base) MCG/ACT inhaler, Inhale 1 puff into the lungs as needed for wheezing or shortness of breath., Disp: , Rfl:  .  atorvastatin (LIPITOR) 40 MG tablet, Take 40 mg by mouth daily., Disp: , Rfl:  .  cholecalciferol (VITAMIN D) 1000 units tablet, Take 2,000 Units by mouth daily., Disp: , Rfl:  .  gabapentin (NEURONTIN) 100 MG capsule, Take 1 capsule (100 mg total) by mouth at bedtime., Disp: 30 capsule, Rfl: 0 .  HYDROcodone-acetaminophen (NORCO) 10-325 MG tablet, Take 1 tablet by mouth 4 (four) times daily as needed., Disp: 120 tablet, Rfl: 0 .  triamcinolone ointment (KENALOG) 0.5 %, Apply 1 application topically 2 (two) times daily., Disp: 30 g, Rfl:  0   Allergies  Allergen Reactions  . Ambien [Zolpidem Tartrate]   . Flexeril [Cyclobenzaprine] Other (See Comments)    Patient unsure of reaction  . Naproxen Other (See Comments)    Patient unsure of reaction.   Review of Systems   Pertinent items are noted in the HPI. Otherwise, ROS is negative.  Vitals:   Vitals:   07/09/17 1027  BP: 138/86  Pulse: 72  Temp: 98.2 F (36.8 C)  TempSrc: Oral  SpO2: 97%  Weight: 114 lb 12.8 oz (52.1 kg)  Height: 5\' 4"  (1.626 m)     Body mass index is 19.71 kg/m. Physical Exam:   Physical Exam  Constitutional: She appears well-nourished.  HENT:  Head: Normocephalic and atraumatic.  Eyes: Pupils are equal, round, and reactive to light. EOM are normal.  Neck: Normal range of motion. Neck supple.  Cardiovascular: Normal rate, regular rhythm, normal heart sounds and intact distal pulses.   Pulmonary/Chest: Effort normal.  Abdominal: Soft.  Skin: Skin is warm.  Psychiatric: She has a normal mood and affect. Her behavior is normal.  Nursing note and vitals reviewed.    Results for orders placed or performed in visit on 07/07/17  CBC with Differential/Platelet  Result Value Ref Range   WBC 7.9 4.0 - 10.5 K/uL   RBC 4.31 3.87 - 5.11 Mil/uL   Hemoglobin 13.9 12.0 - 15.0 g/dL   HCT  42.5 36.0 - 46.0 %   MCV 98.5 78.0 - 100.0 fl   MCHC 32.7 30.0 - 36.0 g/dL   RDW 63.8 46.6 - 59.9 %   Platelets 375.0 150.0 - 400.0 K/uL   Neutrophils Relative % 49.7 43.0 - 77.0 %   Lymphocytes Relative 37.0 12.0 - 46.0 %   Monocytes Relative 8.0 3.0 - 12.0 %   Eosinophils Relative 4.2 0.0 - 5.0 %   Basophils Relative 1.1 0.0 - 3.0 %   Neutro Abs 3.9 1.4 - 7.7 K/uL   Lymphs Abs 2.9 0.7 - 4.0 K/uL   Monocytes Absolute 0.6 0.1 - 1.0 K/uL   Eosinophils Absolute 0.3 0.0 - 0.7 K/uL   Basophils Absolute 0.1 0.0 - 0.1 K/uL  Comprehensive metabolic panel  Result Value Ref Range   Sodium 139 135 - 145 mEq/L   Potassium 4.9 3.5 - 5.1 mEq/L   Chloride 105  96 - 112 mEq/L   CO2 31 19 - 32 mEq/L   Glucose, Bld 116 (H) 70 - 99 mg/dL   BUN 18 6 - 23 mg/dL   Creatinine, Ser 3.57 0.40 - 1.20 mg/dL   Total Bilirubin 0.4 0.2 - 1.2 mg/dL   Alkaline Phosphatase 84 39 - 117 U/L   AST 17 0 - 37 U/L   ALT 18 0 - 35 U/L   Total Protein 6.6 6.0 - 8.3 g/dL   Albumin 4.2 3.5 - 5.2 g/dL   Calcium 9.2 8.4 - 01.7 mg/dL   GFR 793.90 >30.09 mL/min  Lipid panel  Result Value Ref Range   Cholesterol 251 (H) 0 - 200 mg/dL   Triglycerides 23.3 0.0 - 149.0 mg/dL   HDL 00.76 >22.63 mg/dL   VLDL 33.5 0.0 - 45.6 mg/dL   LDL Cholesterol 256 (H) 0 - 99 mg/dL   Total CHOL/HDL Ratio 3    NonHDL 179.05   Vitamin B12  Result Value Ref Range   Vitamin B-12 236 211 - 911 pg/mL  VITAMIN D 25 Hydroxy (Vit-D Deficiency, Fractures)  Result Value Ref Range   VITD 28.10 (L) 30.00 - 100.00 ng/mL  Sedimentation rate  Result Value Ref Range   Sed Rate 6 0 - 30 mm/hr  ANA  Result Value Ref Range   Anit Nuclear Antibody(ANA) NEG NEGATIVE  Rheumatoid factor  Result Value Ref Range   Rhuematoid fact SerPl-aCnc <14 <14 IU/mL  C-reactive protein  Result Value Ref Range   CRP 0.1 (L) 0.5 - 20.0 mg/dL    Assessment and Plan:   Diagnoses and all orders for this visit:  Hyperglycemia Comments: Fasting hyperglycemia, but A1c in normal range. Orders: -     POCT glycosylated hemoglobin (Hb A1C)  Chronic right shoulder pain Comments: Okay to refill early since [place]. Orders: -     HYDROcodone-acetaminophen (NORCO) 10-325 MG tablet; Take 1 tablet by mouth 4 (four) times daily as needed.  Vitamin B 12 deficiency Comments: Low end with fatigue. Okay injection today. Orders: -     cyanocobalamin ((VITAMIN B-12)) injection 1,000 mcg; Inject 1 mL (1,000 mcg total) into the muscle once.  Hyperlipidemia, unspecified hyperlipidemia type Comments: Okay to continue to hold statin for now.    . Reviewed expectations re: course of  current medical issues. . Discussed self-management of symptoms. . Outlined signs and symptoms indicating need for more acute intervention. . Patient verbalized understanding and all questions were answered. [name] Health Maintenance issues including appropriate healthy diet, exercise,  and smoking avoidance were discussed with patient. . See orders for this visit as documented in the electronic medical record. . Patient received an After Visit Summary.  CMA served as Neurosurgeon during this visit. History, Physical, and Plan performed by medical provider. The above documentation has been reviewed and is accurate and complete. Helane Rima, D.O.  Helane Rima, DO [place] [place] 07/09/2017  Future Appointments Date Time Provider Department Center  08/26/2017 10:00 AM Helane Rima, DO [facility] None

## 2017-07-09 NOTE — Progress Notes (Signed)
Office Visit Note   Patient: Patricia Underwood           Date of Birth: 11-Apr-1951           MRN: [id number] Visit Date: 07/08/2017 Requested by: Helane Rima, DO [address] [place], [place] [address] PCP: Helane Rima, DO  Subjective: Chief Complaint  Patient presents with  . Shoulder Pain    HPI: Patricia Underwood is a 66 year old patient with right shoulder pain.  She had a fall earlier this year.  She had surgical repair with what sounds like percutaneous pin fixation.  She's had some pain and discomfort since that time.  She takes Norco for pain.  She does have a history of fibromyalgia.  She moved here from [facility].  Her fracture was in January of this year.  The pain will occasionally wake her from sleep at night.  She likes to kayak.              ROS: All systems reviewed are negative as they relate to the chief complaint within the history of present illness.  Patient denies  fevers or chills.   Assessment & Plan: Visit Diagnoses:  1. Chronic right shoulder pain     Plan: Impression is mild posttraumatic deformity of the right shoulder following pin fixation of a proximal humerus fracture.  No evidence of avascular necrosis of the humeral head.  In general her symptoms by history and on examination today can be explained by the very mild posttraumatic deformity she has in the shoulder.  No intervention required at this time.  She is left-hand-dominant which is in her favor.  I'll see her back as needed.  Next interventional step would potentially be subacromial injection if there is a bursitis component.  Today I don't think that is indicated.  Follow-Up Instructions: Return if symptoms worsen or fail to improve.   Orders:  Orders Placed This Encounter  Procedures  . XR Shoulder Right   No orders of the defined types were placed in this encounter.     Procedures: No procedures performed   Clinical Data: No additional findings.  Objective: Vital Signs: There were  no vitals taken for this visit.  Physical Exam:   Constitutional: Patient appears well-developed HEENT:  Head: Normocephalic Eyes:EOM are normal Neck: Normal range of motion Cardiovascular: Normal rate Pulmonary/chest: Effort normal Neurologic: Patient is alert Skin: Skin is warm Psychiatric: Patient has normal mood and affect    Ortho Exam: Orthopedic exam demonstrates full active and passive range of motion of the left shoulder.  On the right-hand side she's lacking about 10 of full abduction and forward flexion.  Rotator cuff strength is intact.  With full internal rotation she has a little bit of a click in her shoulder.  Rotator cuff strength is intact interspace super space and subscap testing.  Neck range of motion is full.  Motor sensory function to the hand is intact.  Specialty Comments:  No specialty comments available.  Imaging: No results found.   PMFS History: Patient Active Problem List   Diagnosis Date Noted  . Low vitamin D level 07/05/2017  . B12 deficiency 07/05/2017  . Insomnia 07/05/2017  . Chronic right shoulder pain 05/30/2017  . HLD (hyperlipidemia) 05/30/2017   Past Medical History:  Diagnosis Date  . Chronic right shoulder pain 05/30/2017  . HLD (hyperlipidemia) 05/30/2017    No family history on file.  No past surgical history on file. Social History   Occupational History  .  Not on file.   Social History Main Topics  . Smoking status: Former Games developer  . Smokeless tobacco: Former Neurosurgeon  . Alcohol use 4.2 oz/week    7 Glasses of wine per week  . Drug use: No  . Sexual activity: Not Currently

## 2017-07-10 ENCOUNTER — Ambulatory Visit: Payer: Medicare Other | Admitting: Family Medicine

## 2017-07-16 ENCOUNTER — Telehealth: Payer: Self-pay | Admitting: Family Medicine

## 2017-07-16 NOTE — Telephone Encounter (Signed)
Called pt. No answer, VM full.  Pt due for AWV. Revonda Standard call back # [phone].  NOTE* No hx of AWV

## 2017-08-17 ENCOUNTER — Ambulatory Visit (INDEPENDENT_AMBULATORY_CARE_PROVIDER_SITE_OTHER): Payer: Medicare Other | Admitting: Family Medicine

## 2017-08-17 ENCOUNTER — Encounter: Payer: Self-pay | Admitting: Family Medicine

## 2017-08-17 VITALS — BP 130/78 | HR 86 | Temp 98.1°F | Ht 64.0 in | Wt 116.4 lb

## 2017-08-17 DIAGNOSIS — G4700 Insomnia, unspecified: Secondary | ICD-10-CM

## 2017-08-17 DIAGNOSIS — G8929 Other chronic pain: Secondary | ICD-10-CM

## 2017-08-17 DIAGNOSIS — E538 Deficiency of other specified B group vitamins: Secondary | ICD-10-CM

## 2017-08-17 DIAGNOSIS — M79644 Pain in right finger(s): Secondary | ICD-10-CM

## 2017-08-17 DIAGNOSIS — M6530 Trigger finger, unspecified finger: Secondary | ICD-10-CM | POA: Insufficient documentation

## 2017-08-17 DIAGNOSIS — M25511 Pain in right shoulder: Secondary | ICD-10-CM

## 2017-08-17 MED ORDER — HYDROCODONE-ACETAMINOPHEN 10-325 MG PO TABS: 1.0000 | ORAL_TABLET | Freq: Four times a day (QID) | ORAL | 0 refills | Status: DC | PRN

## 2017-08-17 MED ORDER — LORAZEPAM 1 MG PO TABS: 1.0000 mg | ORAL_TABLET | Freq: Every day | ORAL | 0 refills | Status: DC

## 2017-08-17 MED ORDER — CYANOCOBALAMIN 1000 MCG/ML IJ SOLN
1000.0000 ug | Freq: Once | INTRAMUSCULAR | Status: AC
  Administered 2017-08-17: 1000 ug via INTRAMUSCULAR

## 2017-08-17 MED ORDER — DICLOFENAC SODIUM 2 % TD SOLN
1.0000 | Freq: Two times a day (BID) | TRANSDERMAL | 0 refills | Status: DC
Start: 2017-08-17 — End: 2017-08-28

## 2017-08-17 NOTE — Progress Notes (Signed)
Patricia Underwood is a 66 y.o. female is here for follow up.  History of Present Illness:   Insurance claims handler, CMA, acting as scribe for [name].  HPI:  Patient comes in today for follow up.  She mentions a new issue of right thumb pain today.  Right thumb pain began a few weeks ago.  She states her thumb "locks up" on her and sometimes she hears a clicking noise.  Patient is left-handed.  Patient states she is no longer taking gabapentin.  Reports that it did not help her sleep.  She says she tried taking 3 capsules with no relief.  She states having no sleep makes her fibromyalgia flare up.  She states she is up and down all night long due to pain.   States she has tried Ambien in the past and does not want to be back on that medication.  She also does not want to try antidepressants.    Patient is no longer taking atorvastatin.  She was concerned about this due to family history of stroke, however, she has been reassured that at this time there is no reason for her to restart a statin.  Health Maintenance Due  Topic Date Due  . Hepatitis C Screening  1951-01-15  . TETANUS/TDAP  01/02/1970  . MAMMOGRAM  01/02/2001  . COLONOSCOPY  01/02/2001  . DEXA SCAN  01/03/2016  . PNA vac Low Risk Adult (1 of 2 - PCV13) 01/03/2016  . INFLUENZA VACCINE  06/10/2017   Depression screen PHQ 2/9 05/26/2017  Decreased Interest 0  Down, Depressed, Hopeless 0  PHQ - 2 Score 0   PMHx, SurgHx, SocialHx, FamHx, Medications, and Allergies were reviewed in the Visit Navigator and updated as appropriate.   Patient Active Problem List   Diagnosis Date Noted  . Pain of right thumb 08/17/2017  . Low vitamin D level 07/05/2017  . B12 deficiency 07/05/2017  . Insomnia 07/05/2017  . Chronic right shoulder pain 05/30/2017  . HLD (hyperlipidemia) 05/30/2017   Social History  Substance Use Topics  . Smoking status: Former Games developer  . Smokeless tobacco: Former Neurosurgeon  . Alcohol use 4.2 oz/week    7 Glasses of wine  per week   Current Medications and Allergies:   .  albuterol (PROVENTIL HFA;VENTOLIN HFA) 108 (90 Base) MCG/ACT inhaler, Inhale 1 puff into the lungs as needed for wheezing or shortness of breath., Disp: , Rfl:  .  cholecalciferol (VITAMIN D) 1000 units tablet, Take 4,000 Units by mouth daily. , Disp: , Rfl:  .  HYDROcodone-acetaminophen (NORCO) 10-325 MG tablet, Take 1 tablet by mouth 4 (four) times daily as needed., Disp: 120 tablet, Rfl: 0 .  Diclofenac Sodium 2 % SOLN, Place 1 application onto the skin 2 (two) times daily., Disp: 8 g, Rfl: 0  Allergies  Allergen Reactions  . Ambien [Zolpidem Tartrate]   . Flexeril [Cyclobenzaprine] Other (See Comments)    Patient unsure of reaction  . Naproxen Other (See Comments)    Patient unsure of reaction.   Review of Systems   Pertinent items are noted in the HPI. Otherwise, ROS is negative.  Vitals:   Vitals:   08/17/17 1043  BP: 130/78  Pulse: 86  Temp: 98.1 F (36.7 C)  TempSrc: Oral  SpO2: 98%  Weight: 116 lb 6.4 oz (52.8 kg)  Height: 5\' 4"  (1.626 m)     Body mass index is 19.98 kg/m.   Physical Exam:   Physical Exam  Constitutional: She appears  well-nourished.  HENT:  Head: Normocephalic and atraumatic.  Eyes: Pupils are equal, round, and reactive to light. EOM are normal.  Neck: Normal range of motion. Neck supple.  Cardiovascular: Normal rate, regular rhythm, normal heart sounds and intact distal pulses.   Pulmonary/Chest: Effort normal.  Abdominal: Soft.  Musculoskeletal:  TTP CMC joint on right.  Skin: Skin is warm.  Psychiatric: She has a normal mood and affect. Her behavior is normal.  Nursing note and vitals reviewed.   Assessment and Plan:   Kazia was seen today for follow-up.  Diagnoses and all orders for this visit:  Insomnia, unspecified type Comments: Reviewed risks of benzodiazepines. She has taken this before without issues. She will take 1/2 tab q hs prn sleep.  Orders: -     LORazepam  (ATIVAN) 1 MG tablet; Take 1 tablet (1 mg total) by mouth at bedtime.  Pain of right thumb -     Diclofenac Sodium 2 % SOLN; Place 1 application onto the skin 2 (two) times daily.  B12 deficiency -     cyanocobalamin ((VITAMIN B-12)) injection 1,000 mcg; Inject 1 mL (1,000 mcg total) into the muscle once.  Chronic right shoulder pain Comments: Okay to refill today. See above. Database reviewed. Orders: -     HYDROcodone-acetaminophen (NORCO) 10-325 MG tablet; Take 1 tablet by mouth 4 (four) times daily as needed. -     HYDROcodone-acetaminophen (NORCO) 10-325 MG tablet; Take 1 tablet by mouth 4 (four) times daily as needed. -     HYDROcodone-acetaminophen (NORCO) 10-325 MG tablet; Take 1 tablet by mouth 4 (four) times daily as needed.   . Reviewed expectations re: course of current medical issues. . Discussed self-management of symptoms. . Outlined signs and symptoms indicating need for more acute intervention. . Patient verbalized understanding and all questions were answered. [name] Health Maintenance issues including appropriate healthy diet, exercise, and smoking avoidance were discussed with patient. . See orders for this visit as documented in the electronic medical record. . Patient received an After Visit Summary.  CMA served as Neurosurgeon during this visit. History, Physical, and Plan performed by medical provider. The above documentation has been reviewed and is accurate and complete. Helane Rima, D.O.  Helane Rima, DO , [facility] 08/18/2017

## 2017-08-17 NOTE — Patient Instructions (Signed)
Use Pennsaid on your thumb twice daily. Only use a small amount to prevent skin irritation.  Start Ativan at bedtime.  Try 1/2 tablet.

## 2017-08-24 NOTE — Telephone Encounter (Signed)
Spoke to pt. She will call back to schedule AWV.

## 2017-08-26 ENCOUNTER — Ambulatory Visit: Payer: Medicare Other | Admitting: Family Medicine

## 2017-08-28 ENCOUNTER — Ambulatory Visit (INDEPENDENT_AMBULATORY_CARE_PROVIDER_SITE_OTHER): Payer: Medicare Other | Admitting: Sports Medicine

## 2017-08-28 ENCOUNTER — Ambulatory Visit (INDEPENDENT_AMBULATORY_CARE_PROVIDER_SITE_OTHER): Payer: Medicare Other

## 2017-08-28 ENCOUNTER — Other Ambulatory Visit: Payer: Self-pay

## 2017-08-28 ENCOUNTER — Ambulatory Visit: Payer: Self-pay

## 2017-08-28 ENCOUNTER — Encounter: Payer: Self-pay | Admitting: Family Medicine

## 2017-08-28 ENCOUNTER — Encounter: Payer: Self-pay | Admitting: Sports Medicine

## 2017-08-28 ENCOUNTER — Telehealth: Payer: Self-pay

## 2017-08-28 ENCOUNTER — Ambulatory Visit (INDEPENDENT_AMBULATORY_CARE_PROVIDER_SITE_OTHER): Payer: Medicare Other | Admitting: Family Medicine

## 2017-08-28 VITALS — BP 124/74 | HR 74 | Temp 98.1°F | Ht 64.0 in | Wt 121.6 lb

## 2017-08-28 VITALS — BP 142/84 | HR 75 | Ht 64.0 in | Wt 121.6 lb

## 2017-08-28 DIAGNOSIS — M25511 Pain in right shoulder: Principal | ICD-10-CM

## 2017-08-28 DIAGNOSIS — F5102 Adjustment insomnia: Secondary | ICD-10-CM

## 2017-08-28 DIAGNOSIS — M65311 Trigger thumb, right thumb: Secondary | ICD-10-CM

## 2017-08-28 DIAGNOSIS — G8929 Other chronic pain: Secondary | ICD-10-CM | POA: Diagnosis not present

## 2017-08-28 DIAGNOSIS — M79644 Pain in right finger(s): Secondary | ICD-10-CM | POA: Diagnosis not present

## 2017-08-28 DIAGNOSIS — G4700 Insomnia, unspecified: Secondary | ICD-10-CM

## 2017-08-28 MED ORDER — HYDROCODONE-ACETAMINOPHEN 10-325 MG PO TABS: 1.0000 | ORAL_TABLET | Freq: Four times a day (QID) | ORAL | 0 refills | Status: DC | PRN

## 2017-08-28 MED ORDER — LORAZEPAM 1 MG PO TABS: 1.0000 mg | ORAL_TABLET | Freq: Every day | ORAL | 0 refills | Status: DC

## 2017-08-28 NOTE — Procedures (Signed)
PROCEDURE NOTE -  ULTRASOUND GUIDEDINJECTION: Right Trigger Finger Images were obtained and interpreted by myself, Gaspar Bidding, DO  Images have been saved and stored to PACS system. Images obtained on: GE S7 Ultrasound machine  ULTRASOUND FINDINGS: A1 pulley enlargement with edema  DESCRIPTION OF PROCEDURE:  The patient's clinical condition is marked by substantial pain and/or significant functional disability. Other conservative therapy has not provided relief, is contraindicated, or not appropriate. There is a reasonable likelihood that injection will significantly improve the patient's pain and/or functional impairment. After discussing the risks, benefits and expected outcomes of the injection and all questions were reviewed and answered, the patient wished to undergo the above named procedure. Verbal consent was obtained. The ultrasound was used to identify the target structure and adjacent neurovascular structures. The skin was then prepped in sterile fashion and the target structure was injected under direct visualization using sterile technique as below: PREP: Alcohol, Ethel Chloride APPROACH: Direct inplane, single injection, 25g 1.5" needle INJECTAT 0.5cc 1% lidocaine, 0.5cc 0.5% marcaine, 0.5cc 40mg  DepoMedrol ASPIRATE: N/A DRESSING: Band-Aid  Post procedural instructions including recommending icing and warning signs for infection were reviewed. This procedure was well tolerated and there were no complications.   IMPRESSION: Succesful Guided Injection

## 2017-08-28 NOTE — Patient Instructions (Signed)

## 2017-08-28 NOTE — Progress Notes (Signed)
Patricia Underwood is a 66 y.o. female is here for follow up.  History of Present Illness:   Insurance claims handler, CMA, acting as scribe for [name].  HPI:   1. Pain of right thumb.  Pain has been present x 1 month. No known injury or trauma. Pt is left hand dominant. The pain is described as constant aching and is rated as 7/10. Nothing seems to make the pain better or worse, its just constant all the time. She feels that the finger is swollen and it locks up on her. Therapies tried include : She has tried topical anti-inflammatory with no relief. Other associated symptoms include: The pains does not radiate into the RT hand or arm. The pain is not present in any other finger. She has hx of trigger finger in the LT 3rd phalanx.    2. Adjustment insomnia. Rx Ativan working well. She would like to continue. No side effects noted.   3. Chronic right shoulder pain. Going out of town in December. Requests one month of medication for during that time.     Health Maintenance Due  Topic Date Due  . Hepatitis C Screening  05/23/1951  . TETANUS/TDAP  01/02/1970  . MAMMOGRAM  01/02/2001  . COLONOSCOPY  01/02/2001  . DEXA SCAN  01/03/2016  . PNA vac Low Risk Adult (1 of 2 - PCV13) 01/03/2016  . INFLUENZA VACCINE  06/10/2017   Depression screen [facility] 2/9 08/28/2017 05/26/2017  Decreased Interest 0 0  Down, Depressed, Hopeless 0 0  PHQ - 2 Score 0 0   PMHx, SurgHx, SocialHx, FamHx, Medications, and Allergies were reviewed in the Visit Navigator and updated as appropriate.   Patient Active Problem List   Diagnosis Date Noted  . Trigger finger 08/17/2017  . Low vitamin D level 07/05/2017  . B12 deficiency 07/05/2017  . Insomnia 07/05/2017  . Chronic right shoulder pain 05/30/2017  . HLD (hyperlipidemia) 05/30/2017   Social History  Substance Use Topics  . Smoking status: Former Games developer  . Smokeless tobacco: Former Neurosurgeon  . Alcohol use 4.2 oz/week    7 Glasses of wine per week   Current Medications  and Allergies:   .  albuterol (PROVENTIL HFA;VENTOLIN HFA) 108 (90 Base) MCG/ACT inhaler, Inhale 1 puff into the lungs as needed for wheezing or shortness of breath., Disp: , Rfl:  .  cholecalciferol (VITAMIN D) 1000 units tablet, Take 4,000 Units by mouth daily. , Disp: , Rfl:  .  HYDROcodone-acetaminophen (NORCO) 10-325 MG tablet, Take 1 tablet by mouth 4 (four) times daily as needed., Disp: 120 tablet, Rfl: 0 .  LORazepam (ATIVAN) 1 MG tablet, Take 1 tablet (1 mg total) by mouth at bedtime., Disp: 30 tablet, Rfl: 0  Allergies  Allergen Reactions  . Ambien [Zolpidem Tartrate]   . Flexeril [Cyclobenzaprine] Other (See Comments)    Patient unsure of reaction  . Naproxen Other (See Comments)    Patient unsure of reaction.   Review of Systems   Pertinent items are noted in the HPI. Otherwise, ROS is negative.  Vitals:   Vitals:   08/28/17 1055  BP: 124/74  Pulse: 74  Temp: 98.1 F (36.7 C)  TempSrc: Oral  SpO2: 98%  Weight: 121 lb 9.6 oz (55.2 kg)  Height: 5\' 4"  (1.626 m)     Body mass index is 20.87 kg/m.   Physical Exam:   Physical Exam  Constitutional: She appears well-nourished.  HENT:  Head: Normocephalic and atraumatic.  Eyes: Pupils are equal,  round, and reactive to light. EOM are normal.  Neck: Normal range of motion. Neck supple.  Cardiovascular: Normal rate, regular rhythm, normal heart sounds and intact distal pulses.   Pulmonary/Chest: Effort normal.  Abdominal: Soft.  Musculoskeletal:  Pain at right thenar eminence, poor ROM.  Skin: Skin is warm.  Psychiatric: She has a normal mood and affect. Her behavior is normal.  Nursing note and vitals reviewed.   Assessment and Plan:   Chimene was seen today for follow-up.  Diagnoses and all orders for this visit:  Pain of right thumb Comments: To Berline Chough today for injection. Orders: -     DG Finger Thumb Right  Adjustment insomnia Comments: Patient going out of town in December. Okay to give one Rx to  fill while away.  Chronic right shoulder pain Comments: Patient going out of town in December. Okay to give one Rx to fill while away.   . Reviewed expectations re: course of current medical issues. . Discussed self-management of symptoms. . Outlined signs and symptoms indicating need for more acute intervention. . Patient verbalized understanding and all questions were answered. [name] Health Maintenance issues including appropriate healthy diet, exercise, and smoking avoidance were discussed with patient. . See orders for this visit as documented in the electronic medical record. . Patient received an After Visit Summary.  Helane Rima, DO [place] 08/28/2017

## 2017-08-28 NOTE — Telephone Encounter (Signed)
Patient stated she is going out of town in December and will not be back at the three month mark in order to get her new scripts for her pain medications.  She was asking for [name] to write a new script with an additional tablet, which would make the script for Norco 10-325 one tablet 5 times daily.  [name] stated she cannot do that but she will go ahead and write another script for patient that she can get filled while she is away.   Also, per [name], ok to write for refills for patient's lorazepam as patient states it is helping her sleep.  Patient will pick these up today at her appointment with Dr. Berline Chough.

## 2017-08-28 NOTE — Progress Notes (Signed)
OFFICE VISIT NOTE Patricia Underwood. [facility] at [facility] [phone]  Patricia Underwood - 66 y.o. female MRN [id number]  Date of birth: 10/04/1951  Visit Date: 08/28/2017  PCP: Helane Rima, DO   Referred by: Helane Rima, DO  Orlie Dakin, CMA acting as scribe for Dr. Berline Chough.  SUBJECTIVE:   Chief Complaint  Patient presents with  . New Patient (Initial Visit)    RT thumb pain   HPI: As below and per problem based documentation when appropriate.  [name] is a new patient, referred by [name], for evaluation of RT thumb pain.  Pain has been present x 1 month.  No known injury or trauma. Pt is left hand dominant.   The pain is described as constant aching and is rated as 7/10.  Nothing seems to make the pain better or worse, its just constant all the time. She feels that the finger is swollen and it locks up on her.  Therapies tried include : She has tried topical anti-inflammatory with no relief.   Other associated symptoms include: The pains does not radiate into the RT hand or arm. The pain is not present in any other finger. She has hx of trigger finger in the LT 3rd phalanx.     Review of Systems  Constitutional: Negative for chills, fever and malaise/fatigue.  Respiratory: Negative for shortness of breath and wheezing.   Cardiovascular: Negative for chest pain and palpitations.  Neurological: Positive for dizziness. Negative for tingling, weakness and headaches.    Otherwise per HPI.  HISTORY & PERTINENT PRIOR DATA:  No specialty comments available. She reports that she has quit smoking. She has quit using smokeless tobacco.   Recent Labs  07/09/17 1119  HGBA1C 5.4   Allergies reviewed per EMR Prior to Admission medications   Medication Sig Start Date End Date Taking? Authorizing Provider  albuterol (PROVENTIL HFA;VENTOLIN HFA) 108 (90 Base) MCG/ACT inhaler Inhale 1 puff into the lungs as needed for wheezing  or shortness of breath.   Yes [provider]  cholecalciferol (VITAMIN D) 1000 units tablet Take 4,000 Units by mouth daily.    Yes [provider]  HYDROcodone-acetaminophen (NORCO) 10-325 MG tablet Take 1 tablet by mouth 4 (four) times daily as needed. 08/28/17  Yes Helane Rima, DO  LORazepam (ATIVAN) 1 MG tablet Take 1 tablet (1 mg total) by mouth at bedtime. 08/28/17  Yes Helane Rima, DO   Patient Active Problem List   Diagnosis Date Noted  . Trigger finger 08/17/2017  . Low vitamin D level 07/05/2017  . B12 deficiency 07/05/2017  . Insomnia 07/05/2017  . Chronic right shoulder pain 05/30/2017  . HLD (hyperlipidemia) 05/30/2017   Past Medical History:  Diagnosis Date  . Chronic right shoulder pain 05/30/2017  . HLD (hyperlipidemia) 05/30/2017   No family history on file. No past surgical history on file. Social History   Occupational History  . Not on file.   Social History Main Topics  . Smoking status: Former Games developer  . Smokeless tobacco: Former Neurosurgeon  . Alcohol use 4.2 oz/week    7 Glasses of wine per week  . Drug use: No  . Sexual activity: Not Currently    OBJECTIVE:  VS:  HT:5\' 4"  (162.6 cm)   WT:121 lb 9.6 oz (55.2 kg)  BMI:20.86    BP:(!) 142/84  HR:75bpm  TEMP: ( )  RESP:96 % EXAM: Findings:  WDWN, NAD, Non-toxic appearing Alert & appropriately  interactive Not depressed or anxious appearing No increased work of breathing. Pupils are equal. EOM intact without nystagmus No clubbing or cyanosis of the extremities appreciated No significant rashes/lesions/ulcerations overlying the examined area. Radial pulses 2+/4.  No significant generalized UE edema. Sensation intact to light touch in upper extremities.  Right hand: Overall well aligned.  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  She has marked pain over the MCP over the A1 pulley of the thumb.  There is triggering especially with terminal flexion.  Grip strength is  otherwise intact.  No significant pain over the [facility] joint or pain with axial load and circumduction of the first metacarpal.  X-rays obtained earlier today during the office visit with [name] were reviewed by myself and show CMC arthritis but clinically the area of concern for potential nondisplaced fracture does not correlate with where she is having symptoms and she actually has no pain over the [facility] joint.    RADIOLOGY: [place] LIMITED JOINT SPACE STRUCTURES UP RIGHT(NO LINKED CHARGES) Andrena Mews, DO     08/28/2017  1:57 PM PROCEDURE NOTE -  ULTRASOUND GUIDEDINJECTION: Right Trigger  Finger Images were obtained and interpreted by myself, Gaspar Bidding, DO   Images have been saved and stored to PACS system. Images obtained on: GE S7 Ultrasound machine  ULTRASOUND FINDINGS: A1 pulley enlargement with edema  DESCRIPTION OF PROCEDURE:  The patient's clinical condition is marked by substantial pain  and/or significant functional disability. Other conservative  therapy has not provided relief, is contraindicated, or not  appropriate. There is a reasonable likelihood that injection will  significantly improve the patient's pain and/or functional  impairment. After discussing the risks, benefits and expected  outcomes of the injection and all questions were reviewed and  answered, the patient wished to undergo the above named  procedure. Verbal consent was obtained. The ultrasound was used  to identify the target structure and adjacent neurovascular  structures. The skin was then prepped in sterile fashion and the  target structure was injected under direct visualization using  sterile technique as below: PREP: Alcohol, Ethel Chloride APPROACH: Direct inplane, single injection, 25g 1.5" needle INJECTAT 0.5cc 1% lidocaine, 0.5cc 0.5% marcaine, 0.5cc 40mg   DepoMedrol ASPIRATE: N/A DRESSING: Band-Aid  Post procedural instructions including recommending icing and  warning  signs for infection were reviewed. This procedure was  well tolerated and there were no complications.   IMPRESSION: Succesful Guided Injection   DG Finger Thumb Right CLINICAL DATA:  Chronic right thumb pain.  No injury .  EXAM: RIGHT THUMB 2+V  COMPARISON:  No prior .  FINDINGS: Diffuse degenerative change. Lucency noted the base of the first metacarpal. Nondisplaced fracture cannot be excluded.  IMPRESSION: Findings suggest nondisplaced fracture of the base of the first metacarpal. Diffuse degenerative change.  Electronically Signed   By: [place]  Register   On: 08/28/2017 12:44  ASSESSMENT & PLAN:     ICD-10-CM   1. Pain of right thumb M79.644 08/30/2017 LIMITED JOINT SPACE STRUCTURES UP RIGHT(NO LINKED CHARGES)  2. Trigger finger of right thumb M65.311    ================================================================= Trigger finger Injection performed today.  She did have marked amount of swelling of the distal aspect of the tendon as well as A1 pulley.  She should respond well to injection.  Could consider repeat injection versus topical Pennsaid if any lack of improvement.  Some CMC arthritis. [place]  PROCEDURE NOTE -  ULTRASOUND GUIDEDINJECTION: Right Trigger Finger Images were obtained and interpreted by  myself, Gaspar BiddingMichael Rigby, DO  Images have been saved and stored to PACS system. Images obtained on: GE S7 Ultrasound machine  ULTRASOUND FINDINGS: A1 pulley enlargement with edema  DESCRIPTION OF PROCEDURE:  The patient's clinical condition is marked by substantial pain and/or significant functional disability. Other conservative therapy has not provided relief, is contraindicated, or not appropriate. There is a reasonable likelihood that injection will significantly improve the patient's pain and/or functional impairment. After discussing the risks, benefits and expected outcomes of the injection and all questions were reviewed and answered, the patient wished to  undergo the above named procedure. Verbal consent was obtained. The ultrasound was used to identify the target structure and adjacent neurovascular structures. The skin was then prepped in sterile fashion and the target structure was injected under direct visualization using sterile technique as below: PREP: Alcohol, Ethel Chloride APPROACH: Direct inplane, single injection, 25g 1.5" needle INJECTAT 0.5cc 1% lidocaine, 0.5cc 0.5% marcaine, 0.5cc 40mg  DepoMedrol ASPIRATE: N/A DRESSING: Band-Aid  Post procedural instructions including recommending icing and warning signs for infection were reviewed. This procedure was well tolerated and there were no complications.   IMPRESSION: Succesful US Guided Injection    ================================================================= Patient Instructions  You had an injection today.  Things to be aware of after injection are listed below: . You may experience no significant improvement or even a slight worsening in your symptoms during the first 24 to 48 hours.  After that we expect your symptoms to improve gradually over the next 2 weeks for the medicine to have its maximal effect.  You should continue to have improvement out to 6 weeks after your injection. . Dr. Berline Choughigby recommends icing the site of the injection for 20 minutes  1-2 times the day of your injection . You may shower but no swimming, tub bath or Jacuzzi for 24 hours. . If your bandage falls off this does not need to be replaced.  It is appropriate to remove the bandage after 4 hours. . You may resume light activities as tolerated unless otherwise directed per Dr. Berline Choughigby during your visit  POSSIBLE STEROID SIDE EFFECTS:  Side effects from injectable steroids tend to be less than when taken orally however you may experience some of the symptoms listed below.  If experienced these should only last for a short period of time. Change in menstrual flow  Edema (swelling)  Increased appetite Skin  flushing (redness)  Skin rash/acne  Thrush (oral) Yeast vaginitis    Increased sweating  Depression Increased blood glucose levels Cramping and leg/calf  Euphoria (feeling happy)  POSSIBLE PROCEDURE SIDE EFFECTS: The side effects of the injection are usually fairly minimal however if you may experience some of the following side effects that are usually self-limited and will is off on their own.  If you are concerned please feel free to call the office with questions:  Increased numbness or tingling  Nausea or vomiting  Swelling or bruising at the injection site   Please call our office if if you experience any of the following symptoms over the next week as these can be signs of infection:   Fever greater than 100.29F  Significant swelling at the injection site  Significant redness or drainage from the injection site  If after 2 weeks you are continuing to have worsening symptoms please call our office to discuss what the next appropriate actions should be including the potential for a return office visit or other diagnostic testing.     ================================================================= No future appointments.  Follow-up: Return in about 6 weeks (around 10/09/2017).   CMA/ATC served as Neurosurgeon during this visit. History, Physical, and Plan performed by medical provider. Documentation and orders reviewed and attested to.      Gaspar Bidding, DO    [facility] Physician

## 2017-08-28 NOTE — Assessment & Plan Note (Signed)
Injection performed today.  She did have marked amount of swelling of the distal aspect of the tendon as well as A1 pulley.  She should respond well to injection.  Could consider repeat injection versus topical Pennsaid if any lack of improvement.  Some CMC arthritis. [name]

## 2017-09-23 ENCOUNTER — Ambulatory Visit (INDEPENDENT_AMBULATORY_CARE_PROVIDER_SITE_OTHER): Payer: Medicare Other | Admitting: Orthopedic Surgery

## 2017-09-24 ENCOUNTER — Ambulatory Visit (INDEPENDENT_AMBULATORY_CARE_PROVIDER_SITE_OTHER): Payer: Medicare Other

## 2017-09-24 ENCOUNTER — Ambulatory Visit (INDEPENDENT_AMBULATORY_CARE_PROVIDER_SITE_OTHER): Payer: Medicare Other | Admitting: Orthopedic Surgery

## 2017-09-24 ENCOUNTER — Encounter (INDEPENDENT_AMBULATORY_CARE_PROVIDER_SITE_OTHER): Payer: Self-pay | Admitting: Orthopedic Surgery

## 2017-09-24 DIAGNOSIS — M25561 Pain in right knee: Secondary | ICD-10-CM

## 2017-09-24 NOTE — Progress Notes (Signed)
Office Visit Note   Patient: Patricia Underwood           Date of Birth: 1951/05/01           MRN: [id number] Visit Date: 09/24/2017 Requested by: Helane Rima, DO [address] [place], [place] [address] PCP: Helane Rima, DO  Subjective: Chief Complaint  Patient presents with  . Right Knee - Pain    HPI: Patricia Underwood is a 66 year old female with right knee pain.  I saw her in August for her shoulder.  She reports pain in the knee for 2 months without any history of injury.  Pain was constant but it's been better over the last 2 days.  Reports some weakness and giving way but no mechanical symptoms.  She states that she just recovered from a "sciatic flare up".  She takes Norco 08/12/2024 on a fairly regular basis for chronic low back pain and fibromyalgia.              ROS: All systems reviewed are negative as they relate to the chief complaint within the history of present illness.  Patient denies  fevers or chills.   Assessment & Plan: Visit Diagnoses:  1. Acute pain of right knee     Plan: Impression is normal right knee radiographs and exam.  Some of the pain she is having may be coming from wear and tear which is giving her symptomatic flareups of pain but in general structurally and radiographically her knee looks pretty good today.  Injection would be the next step if her symptoms recur  Follow-Up Instructions: Return if symptoms worsen or fail to improve.   Orders:  Orders Placed This Encounter  Procedures  . XR KNEE 3 VIEW RIGHT   No orders of the defined types were placed in this encounter.     Procedures: No procedures performed   Clinical Data: No additional findings.  Objective: Vital Signs: There were no vitals taken for this visit.  Physical Exam:   Constitutional: Patient appears well-developed HEENT:  Head: Normocephalic Eyes:EOM are normal Neck: Normal range of motion Cardiovascular: Normal rate Pulmonary/chest: Effort normal Neurologic: Patient  is alert Skin: Skin is warm Psychiatric: Patient has normal mood and affect    Ortho Exam: Orthopedic exam demonstrates full range of motion right knee stable collateral crucial ligaments palpable pedal pulses no focal joint line tenderness no tenderness of the extensor mechanism no masses lymph adenopathy or skin changes noted in the right knee region  Specialty Comments:  No specialty comments available.  Imaging: Xr Knee 3 View Right  Result Date: 09/24/2017 AP lateral merchant right knee reviewed.  No arthritis is present.  Joint spaces maintained.  No fracture or effusion noted.  Normal right knee    PMFS History: Patient Active Problem List   Diagnosis Date Noted  . Trigger finger 08/17/2017  . Low vitamin D level 07/05/2017  . B12 deficiency 07/05/2017  . Insomnia 07/05/2017  . Chronic right shoulder pain 05/30/2017  . HLD (hyperlipidemia) 05/30/2017   Past Medical History:  Diagnosis Date  . Chronic right shoulder pain 05/30/2017  . HLD (hyperlipidemia) 05/30/2017    History reviewed. No pertinent family history.  History reviewed. No pertinent surgical history. Social History   Occupational History  . Not on file  Tobacco Use  . Smoking status: Former Games developer  . Smokeless tobacco: Former Engineer, water and Sexual Activity  . Alcohol use: Yes    Alcohol/week: 4.2 oz    Types: 7  Glasses of wine per week  . Drug use: No  . Sexual activity: Not Currently

## 2017-10-16 ENCOUNTER — Encounter: Payer: Self-pay | Admitting: Family Medicine

## 2017-10-16 ENCOUNTER — Ambulatory Visit (INDEPENDENT_AMBULATORY_CARE_PROVIDER_SITE_OTHER): Payer: Medicare Other | Admitting: Family Medicine

## 2017-10-16 VITALS — BP 140/80 | HR 78 | Temp 98.6°F | Wt 126.4 lb

## 2017-10-16 DIAGNOSIS — M25561 Pain in right knee: Secondary | ICD-10-CM

## 2017-10-16 DIAGNOSIS — E538 Deficiency of other specified B group vitamins: Secondary | ICD-10-CM | POA: Diagnosis not present

## 2017-10-16 MED ORDER — CYANOCOBALAMIN 1000 MCG/ML IJ SOLN
1000.0000 ug | Freq: Once | INTRAMUSCULAR | Status: AC
  Administered 2017-10-16: 1000 ug via INTRAMUSCULAR

## 2017-10-16 MED ORDER — PREDNISONE 5 MG PO TABS: 5.0000 mg | ORAL_TABLET | Freq: Every day | ORAL | 0 refills | Status: DC

## 2017-10-16 NOTE — Progress Notes (Signed)
Patricia Underwood is a 66 y.o. female is here for follow up.  History of Present Illness:   Knee Pain   The injury mechanism is unknown. The pain is present in the right knee. The quality of the pain is described as aching and stabbing. The pain is moderate. The pain has been worsening since onset. Associated symptoms include a loss of motion, a loss of sensation, muscle weakness and tingling. She reports no foreign bodies present. The symptoms are aggravated by movement and weight bearing. She has tried ice, NSAIDs and rest for the symptoms. The treatment provided no relief.   She was recently evaluated by Orthopedics. A knee xray was found to unimpressive - OA.   She has a Hx of chronic Norco use for sciatica and fibromyalgia.   Health Maintenance Due  Topic Date Due  . Hepatitis C Screening  1950/12/10  . TETANUS/TDAP  01/02/1970  . MAMMOGRAM  01/02/2001  . COLONOSCOPY  01/02/2001  . DEXA SCAN  01/03/2016   Depression screen PHQ 2/9 08/28/2017 05/26/2017  Decreased Interest 0 0  Down, Depressed, Hopeless 0 0  PHQ - 2 Score 0 0   PMHx, SurgHx, SocialHx, FamHx, Medications, and Allergies were reviewed in the Visit Navigator and updated as appropriate.   Patient Active Problem List   Diagnosis Date Noted  . Trigger finger 08/17/2017  . Low vitamin D level 07/05/2017  . B12 deficiency 07/05/2017  . Insomnia 07/05/2017  . Chronic right shoulder pain 05/30/2017  . HLD (hyperlipidemia) 05/30/2017   Social History   Tobacco Use  . Smoking status: Former Games developer  . Smokeless tobacco: Former Engineer, water Use Topics  . Alcohol use: Yes    Alcohol/week: 4.2 oz    Types: 7 Glasses of wine per week  . Drug use: No   Current Medications and Allergies:   .  albuterol (PROVENTIL HFA;VENTOLIN HFA) 108 (90 Base) MCG/ACT inhaler, Inhale 1 puff into the lungs as needed for wheezing or shortness of breath., Disp: , Rfl:  .  cholecalciferol (VITAMIN D) 1000 units tablet, Take 4,000 Units  by mouth daily. , Disp: , Rfl:  .  HYDROcodone-acetaminophen (NORCO) 10-325 MG tablet, Take 1 tablet by mouth 4 (four) times daily as needed., Disp: 120 tablet, Rfl: 0 .  LORazepam (ATIVAN) 1 MG tablet, Take 1 tablet (1 mg total) by mouth at bedtime., Disp: 30 tablet, Rfl: 0   Allergies  Allergen Reactions  . Ambien [Zolpidem Tartrate]   . Flexeril [Cyclobenzaprine] Other (See Comments)    Patient unsure of reaction  . Naproxen Other (See Comments)    Patient unsure of reaction.   Review of Systems   Pertinent items are noted in the HPI. Otherwise, ROS is negative.  Vitals:   Vitals:   10/16/17 1257  BP: 140/80  Pulse: 78  Temp: 98.6 F (37 C)  TempSrc: Oral  SpO2: 97%  Weight: 126 lb 6.4 oz (57.3 kg)     Body mass index is 21.7 kg/m.   Physical Exam:   Physical Exam  Constitutional: She appears well-nourished.  HENT:  Head: Normocephalic and atraumatic.  Eyes: EOM are normal. Pupils are equal, round, and reactive to light.  Neck: Normal range of motion. Neck supple.  Cardiovascular: Normal rate, regular rhythm, normal heart sounds and intact distal pulses.  Pulmonary/Chest: Effort normal.  Abdominal: Soft.  Musculoskeletal:       Right knee: She exhibits decreased range of motion and effusion. She exhibits no deformity, no erythema,  no LCL laxity, normal patellar mobility and no MCL laxity. Tenderness found. Medial joint line and lateral joint line tenderness noted.  Skin: Skin is warm.  Psychiatric: She has a normal mood and affect. Her behavior is normal.  Nursing note and vitals reviewed.   Assessment and Plan:   Patricia Underwood was seen today for knee pain.  Diagnoses and all orders for this visit:  Acute pain of right knee Comments:  Worsening. Joint line tenderness, stabbing, now catching. Patient is concerned that she may have issues while traveling (she will be going to [place] in a week). Reviewed PRICE. She will pick up a knee sleeve. MRI ordered. Rx  prednisone provided as safety net while traveling. No red flags today. She declined knee corticosteroid injection today.  Orders: -     MR Knee Right Wo Contrast; Future -     predniSONE (DELTASONE) 5 MG tablet; Take 1 tablet (5 mg total) by mouth daily with breakfast. 6-5-4-3-2-1-off  B12 deficiency -     cyanocobalamin ((VITAMIN B-12)) injection 1,000 mcg   . Reviewed expectations re: course of current medical issues. . Discussed self-management of symptoms. . Outlined signs and symptoms indicating need for more acute intervention. . Patient verbalized understanding and all questions were answered. [name] Health Maintenance issues including appropriate healthy diet, exercise, and smoking avoidance were discussed with patient. . See orders for this visit as documented in the electronic medical record. . Patient received an After Visit Summary.  Helane Rima, DO [place] 10/18/2017  No future appointments.

## 2017-10-18 ENCOUNTER — Encounter: Payer: Self-pay | Admitting: Family Medicine

## 2017-10-22 ENCOUNTER — Telehealth: Payer: Self-pay

## 2017-10-22 NOTE — Telephone Encounter (Signed)
Spoke with pharmacy and patient about prescription. Per [name] ok for the patient to pick up prescription early. Insurance will not pay per pharmacy so patient is going to pay cash. Approval given to the pharmacy

## 2017-10-22 NOTE — Telephone Encounter (Signed)
Copied from CRM [id number]. Topic: General - Other >> Oct 22, 2017 11:26 AM Darletta Moll L wrote: Reason for CRM: Patient needs someone to call [facility] to clear them to fill her hydrocodone early 12/18, b/c she is going out of town. She also stated she is supposed to have an MRI of her knee but no one has contacted her. She doesn't know where the MRI is supposed to take place and would like a call back as soon as possible to discuss these matters.

## 2017-11-17 ENCOUNTER — Ambulatory Visit
Admission: RE | Admit: 2017-11-17 | Discharge: 2017-11-17 | Disposition: A | Discharge status: Home / Self Care | Payer: Medicare Other | Source: Ambulatory Visit | Attending: Family Medicine | Admitting: Family Medicine

## 2017-11-17 DIAGNOSIS — M25561 Pain in right knee: Secondary | ICD-10-CM | POA: Diagnosis not present

## 2017-11-19 ENCOUNTER — Encounter: Payer: Self-pay | Admitting: Family Medicine

## 2017-11-19 ENCOUNTER — Ambulatory Visit: Payer: Medicare Other | Admitting: Family Medicine

## 2017-11-19 ENCOUNTER — Ambulatory Visit (INDEPENDENT_AMBULATORY_CARE_PROVIDER_SITE_OTHER): Payer: Medicare Other | Admitting: Family Medicine

## 2017-11-19 VITALS — BP 130/80 | HR 66 | Temp 97.2°F | Wt 118.2 lb

## 2017-11-19 DIAGNOSIS — E538 Deficiency of other specified B group vitamins: Secondary | ICD-10-CM

## 2017-11-19 DIAGNOSIS — M25561 Pain in right knee: Secondary | ICD-10-CM

## 2017-11-19 DIAGNOSIS — G8929 Other chronic pain: Secondary | ICD-10-CM

## 2017-11-19 DIAGNOSIS — G4700 Insomnia, unspecified: Secondary | ICD-10-CM | POA: Diagnosis not present

## 2017-11-19 DIAGNOSIS — G894 Chronic pain syndrome: Secondary | ICD-10-CM

## 2017-11-19 MED ORDER — OXYCODONE-ACETAMINOPHEN 10-325 MG PO TABS: 1.0000 | ORAL_TABLET | Freq: Four times a day (QID) | ORAL | 0 refills | Status: DC | PRN

## 2017-11-19 MED ORDER — LORAZEPAM 1 MG PO TABS: 1.0000 mg | ORAL_TABLET | Freq: Every day | ORAL | 3 refills | Status: DC

## 2017-11-19 MED ORDER — OXYCODONE-ACETAMINOPHEN 10-325 MG PO TABS: 1.0000 | ORAL_TABLET | Freq: Four times a day (QID) | ORAL | 0 refills | Status: AC | PRN

## 2017-11-19 MED ORDER — CYANOCOBALAMIN 1000 MCG/ML IJ SOLN
1000.0000 ug | Freq: Once | INTRAMUSCULAR | Status: AC
  Administered 2017-11-19: 1000 ug via INTRAMUSCULAR

## 2017-11-19 NOTE — Progress Notes (Signed)
Patricia Underwood is a 67 y.o. female is here for follow up.  History of Present Illness:   Barnie Mort, CMA acting as scribe for Dr. Helane Rima.   HPI: See Assessment and Plan section for Problem Based Charting of issues discussed today.  Health Maintenance Due  Topic Date Due  . Hepatitis C Screening  01/07/1951  . TETANUS/TDAP  01/02/1970  . MAMMOGRAM  01/02/2001  . COLONOSCOPY  01/02/2001  . DEXA SCAN  01/03/2016   Depression screen PHQ 2/9 08/28/2017 05/26/2017  Decreased Interest 0 0  Down, Depressed, Hopeless 0 0  PHQ - 2 Score 0 0   PMHx, SurgHx, SocialHx, FamHx, Medications, and Allergies were reviewed in the Visit Navigator and updated as appropriate.   Patient Active Problem List   Diagnosis Date Noted  . Trigger finger 08/17/2017  . Low vitamin D level 07/05/2017  . B12 deficiency 07/05/2017  . Insomnia 07/05/2017  . Chronic right shoulder pain 05/30/2017  . HLD (hyperlipidemia) 05/30/2017   Social History   Tobacco Use  . Smoking status: Former Games developer  . Smokeless tobacco: Former Engineer, water Use Topics  . Alcohol use: Yes    Alcohol/week: 4.2 oz    Types: 7 Glasses of wine per week  . Drug use: No   Current Medications and Allergies:   .  albuterol (PROVENTIL HFA;VENTOLIN HFA) 108 (90 Base) MCG/ACT inhaler, Inhale 1 puff into the lungs as needed for wheezing or shortness of breath., Disp: , Rfl:  .  cholecalciferol (VITAMIN D) 1000 units tablet, Take 4,000 Units by mouth daily. , Disp: , Rfl:  .  LORazepam (ATIVAN) 1 MG tablet, Take 1 tablet (1 mg total) by mouth at bedtime., Disp: 30 tablet, Rfl: 0 .  HYDROcodone-acetaminophen (NORCO) 10-325 MG tablet, Take 1 tablet by mouth 4 (four) times daily as needed. (Patient not taking: Reported on 11/19/2017), Disp: 120 tablet, Rfl: 0   Allergies  Allergen Reactions  . Ambien [Zolpidem Tartrate]   . Flexeril [Cyclobenzaprine] Other (See Comments)    Patient unsure of reaction  . Naproxen Other (See  Comments)    Patient unsure of reaction.   Review of Systems   Pertinent items are noted in the HPI. Otherwise, ROS is negative.  Vitals:   Vitals:   11/19/17 1013  BP: 130/80  Pulse: 66  Temp: (!) 97.2 F (36.2 C)  TempSrc: Oral  SpO2: 98%  Weight: 118 lb 3.2 oz (53.6 kg)     Body mass index is 20.29 kg/m.  Physical Exam:   Physical Exam  Constitutional: She is oriented to person, place, and time. She appears well-developed and well-nourished. No distress.  HENT:  Head: Normocephalic and atraumatic.  Right Ear: External ear normal.  Left Ear: External ear normal.  Nose: Nose normal.  Mouth/Throat: Oropharynx is clear and moist.  Eyes: Conjunctivae and EOM are normal. Pupils are equal, round, and reactive to light.  Neck: Normal range of motion. Neck supple. No thyromegaly present.  Cardiovascular: Normal rate, regular rhythm, normal heart sounds and intact distal pulses.  Pulmonary/Chest: Effort normal and breath sounds normal.  Abdominal: Soft. Bowel sounds are normal.  Musculoskeletal: Normal range of motion.  Lymphadenopathy:    She has no cervical adenopathy.  Neurological: She is alert and oriented to person, place, and time.  Skin: Skin is warm and dry. Capillary refill takes less than 2 seconds.  Psychiatric: She has a normal mood and affect. Her behavior is normal.  Nursing note and vitals  reviewed.   Results for orders placed or performed in visit on 07/09/17  POCT glycosylated hemoglobin (Hb A1C)  Result Value Ref Range   Hemoglobin A1C 5.4    Assessment and Plan:   1. Insomnia, unspecified type Well controlled.  No signs of complications, medication side effects, or red flags.  Continue current regimen.    - LORazepam (ATIVAN) 1 MG tablet; Take 1 tablet (1 mg total) by mouth at bedtime.  Dispense: 30 tablet; Refill: 3  2. B12 deficiency - cyanocobalamin ((VITAMIN B-12)) injection 1,000 mcg  3. Chronic pain syndrome Chronic pain syndrome.  She  has been prescribed Norco by me for the last few months.  She presents today stating that she has noticed ongoing headaches associated with taking the Norco.  She has not noticed these in the past.  Looking at her previous records, the patient realized that she had been taking oxycodone instead of hydrocodone.  She asks to transition to that.  Surprisingly, the patient states that she has been weaning off of the medication over the past few months.  She has several trips planned over the next few months and would like to continue with the current dose, but she would like to work on weaning off completely over the next year.  She is interested in other forms of pain control.  She anticipates having a grandchild born in the next year or 2 and wants to be available to help take care of the child.  She understands that her son would not be comfortable if she is on high doses of narcotics.  I highly support this transition.  Prescriptions were given today.  No concerns on database.  4. Chronic pain of right knee Patient's most recent MRI was reviewed.  This was done on 11/17/2017.  It did show a large horizontal tear in the medial meniscus body and posterior horn of the right knee.  It also showed mild patellofemoral osteoarthritis.  It is actually somewhat improved today.  She has been walking a little more.  She would like to hold off on further intervention but understands that Dr. Berline Chough and sports medicine will be the best next step.  . Reviewed expectations re: course of current medical issues. . Discussed self-management of symptoms. . Outlined signs and symptoms indicating need for more acute intervention. . Patient verbalized understanding and all questions were answered. [name] Health Maintenance issues including appropriate healthy diet, exercise, and smoking avoidance were discussed with patient. . See orders for this visit as documented in the electronic medical record. . Patient received an After Visit  Summary.  CMA served as Neurosurgeon during this visit. History, Physical, and Plan performed by medical provider. The above documentation has been reviewed and is accurate and complete. Helane Rima, D.O.  Helane Rima, DO [employer] 11/21/2017

## 2017-11-26 ENCOUNTER — Ambulatory Visit (INDEPENDENT_AMBULATORY_CARE_PROVIDER_SITE_OTHER): Payer: Medicare Other | Admitting: Sports Medicine

## 2017-11-26 ENCOUNTER — Encounter: Payer: Self-pay | Admitting: Sports Medicine

## 2017-11-26 DIAGNOSIS — S83249A Other tear of medial meniscus, current injury, unspecified knee, initial encounter: Secondary | ICD-10-CM | POA: Insufficient documentation

## 2017-11-26 DIAGNOSIS — S83241A Other tear of medial meniscus, current injury, right knee, initial encounter: Secondary | ICD-10-CM | POA: Diagnosis not present

## 2017-11-26 NOTE — Assessment & Plan Note (Signed)
Her knee overall exam is quite well today.  She does have a small amount of clicking with medial McMurray's testing but this is mild and does not reproduce any pain.  She has no locking or catching.  She does have frequent falls that she relates to her dog pulling her over but denies any true overt giving way of the knee.  Given these findings and presentations and her desire to avoid any type of intervention we discussed the possibility of this continued to cause increased medial wear and importance of avoiding exacerbating activities including deep knee bends.  If she has any recurrent symptoms or worsening swelling or mechanical symptoms next step would be injection therapy which she would like to hold off on this.  We will go ahead and set her up with a knee brace today and let her try both Body Helix compression sleeve and a reaction knee brace which ever one is more comfortable for is appropriate for her I would recommend that she wear this on a regular basis while active.

## 2017-11-26 NOTE — Patient Instructions (Addendum)
DonJoy reaction knee brace provided today.

## 2017-11-26 NOTE — Progress Notes (Signed)
Patricia Underwood. [facility] [id number]  Dawn Convery - 67 y.o. female MRN [id number]  Date of birth: 03-31-1951  Visit Date: 11/26/2017  PCP: Helane Rima, DO   Referred by: Helane Rima, DO   Scribe for today's visit: [name], LAT, ATC     SUBJECTIVE:  Patricia Underwood is here for Initial Assessment (R knee pain - medial meniscal tear) .   Her R knee pain symptoms INITIALLY: Began in Oct. 2018 when she fell while walking her dog. Described as mild constant, aching pain, nonradiating Worsened with unknown Improved with nothing Additional associated symptoms include: no N/T noted in her R LE    At this time symptoms are improving compared to onset  She is currently not taking any medication or using any modalities for her R knee pain.  Saw Dr. August Saucer and got x-rays and has had an MRI that indicates a medial meniscal tear.  Not interested in surgery.  Doesn't want a cortisone shot right now.  Will start travelling in 2 weeks.  Wants to know what she can do given what she doesn't want to do.   ROS Denies night time disturbances. Denies fevers, chills, or night sweats. Denies unexplained weight loss. Denies personal history of cancer. Denies changes in bowel or bladder habits. Reports recent unreported falls.  Larey Seat a couple weeks ago and fell on her R knee and R shoulder. Denies new or worsening dyspnea or wheezing. Denies headaches or dizziness.  Reports numbness, tingling or weakness  In the extremities - bilateral UEs. Denies dizziness or presyncopal episodes Denies lower extremity edema     HISTORY & PERTINENT PRIOR DATA:  Prior History reviewed and updated per electronic medical record.  Significant history, findings, studies and interim changes include:  reports that she has quit smoking. She has quit using smokeless tobacco. Recent Labs    07/09/17 1119  HGBA1C 5.4   No specialty comments  available. Problem  Acute Medial Meniscal Tear   MRI 11/17/2017: IMPRESSION:  1. Large horizontal tear of the medial meniscus body and posterior horn. 2. Mild patellofemoral osteoarthritis.     OBJECTIVE:  VS:  HT:5\' 4"  (162.6 cm)   WT:121 lb 6.4 oz (55.1 kg)  BMI:20.83    BP:118/72  HR:61bpm  TEMP: ( )  RESP:96 %   PHYSICAL EXAM: Constitutional: WDWN, Non-toxic appearing. Psychiatric: Alert & appropriately interactive.  Not depressed or anxious appearing. Respiratory: No increased work of breathing.  Trachea Midline Eyes: Pupils are equal.  EOM intact without nystagmus.  No scleral icterus  EXTREMITIES EXAM: No clubbing or cyanosis appreciated Capillary Refill is normal, less than 2 seconds No significant venous stasis changes Pre-tibial edema: No significant pretibial edema Pedal Pulses: Normal & symmetrically palpable  Sensation: Intact to light touch in all UE dermatomes Strength: Normal in bilateral UE myotomes   RightKnee  Alignment & Contours: normal Skin: No overlying erythema/ecchymosis Effusion: none   Generalized Synovitis: none Knee Tenderness: No focal bony TTP Gait: normal   RANGE OF MOTION & STRENGTH  EXTENSION: Normal  with no pain Strength: Normal FLEXION: Normal with no pain Strength: Normal   LIGAMENTOUS TESTING  Varus & Valgus Strain: stable to testing Anterior & Posterior Drawer: stable to testing: Lachman's: stable to testing   SPECIALITY TESTING:  Crepitation with Patellar Grind: No Patellar Apprehension: No J Sign: Negative Mcmurray's: Positive Medial Thessaly: Not Tested   No additional findings.   ASSESSMENT & PLAN:  1. Acute medial meniscus tear of right knee, initial encounter    PLAN:    Acute medial meniscal tear Her knee overall exam is quite well today.  She does have a small amount of clicking with medial McMurray's testing but this is mild and does not reproduce any pain.  She has no locking or catching.  She does  have frequent falls that she relates to her dog pulling her over but denies any true overt giving way of the knee.  Given these findings and presentations and her desire to avoid any type of intervention we discussed the possibility of this continued to cause increased medial wear and importance of avoiding exacerbating activities including deep knee bends.  If she has any recurrent symptoms or worsening swelling or mechanical symptoms next step would be injection therapy which she would like to hold off on this.  We will go ahead and set her up with a knee brace today and let her try both Body Helix compression sleeve and a reaction knee brace which ever one is more comfortable for is appropriate for her I would recommend that she wear this on a regular basis while active.   >50% of this 25 minute visit spent in direct patient counseling and/or coordination of care.  Discussion was focused on education regarding the in discussing the pathoetiology and anticipated clinical course of the above condition.  ++++++++++++++++++++++++++++++++++++++++++++ Orders & Meds: No orders of the defined types were placed in this encounter.   No orders of the defined types were placed in this encounter.   ++++++++++++++++++++++++++++++++++++++++++++ Follow-up: Return if symptoms worsen or fail to improve.   Pertinent documentation may be included in additional procedure notes, imaging studies, problem based documentation and patient instructions. Please see these sections of the encounter for additional information regarding this visit. CMA/ATC served as Neurosurgeon during this visit. History, Physical, and Plan performed by medical provider. Documentation and orders reviewed and attested to.      Andrena Mews, DO    [facility]

## 2017-12-08 ENCOUNTER — Ambulatory Visit (INDEPENDENT_AMBULATORY_CARE_PROVIDER_SITE_OTHER): Payer: Medicare Other | Admitting: Family Medicine

## 2017-12-08 ENCOUNTER — Encounter: Payer: Self-pay | Admitting: Family Medicine

## 2017-12-08 VITALS — BP 118/74 | HR 82 | Temp 98.4°F | Ht 64.0 in | Wt 117.6 lb

## 2017-12-08 DIAGNOSIS — H109 Unspecified conjunctivitis: Secondary | ICD-10-CM

## 2017-12-08 MED ORDER — ERYTHROMYCIN 5 MG/GM OP OINT: 1.0000 "application " | TOPICAL_OINTMENT | Freq: Two times a day (BID) | OPHTHALMIC | 0 refills | Status: AC

## 2017-12-08 NOTE — Patient Instructions (Signed)

## 2017-12-08 NOTE — Progress Notes (Signed)
    Subjective:  Patricia Underwood is a 67 y.o. female who presents today for same-day appointment with a chief complaint of right eye irritation.   HPI:  Right Eye Irritation, acute issue Symptoms started yesterday evening.  Worsened over that time.  Symptoms located solely to her right eye.  No obvious precipitating events.  She has not tried any treatments.  She has had thick, white, green discharge from that eye.  Also with a gritty-like sensation.  A small amount of blurred vision.  No fevers or chills.  No pain with eye movement.  No photophobia.  ROS: Per HPI  PMH: She reports that she has quit smoking. She has quit using smokeless tobacco. She reports that she drinks about 4.2 oz of alcohol per week. She reports that she does not use drugs.  Objective:  Physical Exam: BP 118/74 (BP Location: Right Arm, Patient Position: Sitting, Cuff Size: Normal)   Pulse 82   Temp 98.4 F (36.9 C) (Oral)   Ht 5\' 4"  (1.626 m)   Wt 117 lb 9.6 oz (53.3 kg)   SpO2 98%   BMI 20.19 kg/m   Gen: NAD, resting comfortably HEENT: Extraocular eye movements intact bilaterally without pain.  No photophobia.  Right eye with white/green purulent discharge.  See below picture.     Assessment/Plan:  Bacterial conjunctivitis No red flag signs or symptoms.  We will start erythromycin ointment for bacterial conjunctivitis. Check eye culture. Strict return precautions reviewed. Follow up in 1-2 weeks with PCP.   . Katina Degree, MD 12/08/2017 10:47 AM

## 2017-12-11 LAB — EYE CULTURE
MICRO NUMBER: 90123240
SPECIMEN QUALITY:: ADEQUATE

## 2017-12-11 NOTE — Progress Notes (Signed)
Please let patient know that her eye culture was positive (my chart declined). The antibiotic we have her on should treat her specific infection. If her symptoms are worsening or not improving, she needs to let us know.  Katina Degree. Jimmey Ralph, MD 12/11/2017 10:52 AM

## 2017-12-23 DIAGNOSIS — M25561 Pain in right knee: Secondary | ICD-10-CM | POA: Diagnosis not present

## 2017-12-30 ENCOUNTER — Ambulatory Visit (INDEPENDENT_AMBULATORY_CARE_PROVIDER_SITE_OTHER): Payer: Medicare Other | Admitting: Family Medicine

## 2017-12-30 VITALS — BP 138/86 | HR 75 | Temp 98.3°F | Wt 118.8 lb

## 2017-12-30 DIAGNOSIS — L819 Disorder of pigmentation, unspecified: Secondary | ICD-10-CM | POA: Diagnosis not present

## 2017-12-30 DIAGNOSIS — S83241A Other tear of medial meniscus, current injury, right knee, initial encounter: Secondary | ICD-10-CM

## 2017-12-30 DIAGNOSIS — F5102 Adjustment insomnia: Secondary | ICD-10-CM | POA: Diagnosis not present

## 2017-12-30 DIAGNOSIS — G894 Chronic pain syndrome: Secondary | ICD-10-CM

## 2017-12-30 DIAGNOSIS — E538 Deficiency of other specified B group vitamins: Secondary | ICD-10-CM | POA: Diagnosis not present

## 2017-12-30 MED ORDER — LORAZEPAM 1 MG PO TABS: 2.0000 mg | ORAL_TABLET | Freq: Every day | ORAL | 1 refills | Status: DC

## 2017-12-30 MED ORDER — OXYCODONE-ACETAMINOPHEN 10-325 MG PO TABS: 1.0000 | ORAL_TABLET | Freq: Four times a day (QID) | ORAL | 0 refills | Status: DC | PRN

## 2017-12-30 MED ORDER — TRETINOIN 0.025 % EX CREA: TOPICAL_CREAM | Freq: Every day | CUTANEOUS | 0 refills | Status: DC

## 2017-12-30 MED ORDER — ALBUTEROL SULFATE HFA 108 (90 BASE) MCG/ACT IN AERS: 2.0000 | INHALATION_SPRAY | Freq: Four times a day (QID) | RESPIRATORY_TRACT | 0 refills | Status: DC | PRN

## 2017-12-30 MED ORDER — CYANOCOBALAMIN 1000 MCG/ML IJ SOLN
1000.0000 ug | Freq: Once | INTRAMUSCULAR | Status: AC
  Administered 2017-12-30: 1000 ug via INTRAMUSCULAR

## 2017-12-30 NOTE — Progress Notes (Signed)
Patricia Underwood is a 67 y.o. female is here for follow up.  History of Present Illness:   HPI: See Assessment and Plan section for Problem Based Charting of issues discussed today.   Health Maintenance Due  Topic Date Due  . Hepatitis C Screening  Jan 26, 1951  . TETANUS/TDAP  01/02/1970  . MAMMOGRAM  01/02/2001  . COLONOSCOPY  01/02/2001  . DEXA SCAN  01/03/2016   Depression screen PHQ 2/9 08/28/2017 05/26/2017  Decreased Interest 0 0  Down, Depressed, Hopeless 0 0  PHQ - 2 Score 0 0   PMHx, SurgHx, SocialHx, FamHx, Medications, and Allergies were reviewed in the Visit Navigator and updated as appropriate.   Patient Active Problem List   Diagnosis Date Noted  . Acute medial meniscal tear 11/26/2017  . Trigger finger 08/17/2017  . Low vitamin D level 07/05/2017  . B12 deficiency 07/05/2017  . Insomnia 07/05/2017  . Chronic right shoulder pain 05/30/2017  . HLD (hyperlipidemia) 05/30/2017   Social History   Tobacco Use  . Smoking status: Former Games developer  . Smokeless tobacco: Former Engineer, water Use Topics  . Alcohol use: Yes    Alcohol/week: 4.2 oz    Types: 7 Glasses of wine per week  . Drug use: No   Current Medications and Allergies:   .  cholecalciferol (VITAMIN D) 1000 units tablet, Take 4,000 Units by mouth daily. , Disp: , Rfl:  .  oxyCODONE-acetaminophen (PERCOCET) 10-325 MG tablet, Take 1 tablet by mouth every 6 (six) hours as needed for pain., Disp: 120 tablet, Rfl: 0 .  oxyCODONE-acetaminophen (PERCOCET) 10-325 MG tablet, Take 1 tablet by mouth every 6 (six) hours as needed for pain., Disp: 120 tablet, Rfl: 0 .  albuterol (PROVENTIL HFA;VENTOLIN HFA) 108 (90 Base) MCG/ACT inhaler, Inhale 2 puffs into the lungs every 6 (six) hours as needed for wheezing or shortness of breath., Disp: 1 Inhaler, Rfl: 0 .  LORazepam (ATIVAN) 1 MG tablet, Take 2 tablets (2 mg total) by mouth at bedtime., Disp: 60 tablet, Rfl: 1 .  tretinoin (RETIN-A) 0.025 % cream, Apply topically  at bedtime., Disp: 45 g, Rfl: 0  Allergies  Allergen Reactions  . Ambien [Zolpidem Tartrate]   . Flexeril [Cyclobenzaprine] Other (See Comments)    Patient unsure of reaction  . Naproxen Other (See Comments)    Patient unsure of reaction.   Review of Systems   Pertinent items are noted in the HPI. Otherwise, ROS is negative.  Vitals:   Vitals:   12/30/17 1045  BP: 138/86  Pulse: 75  Temp: 98.3 F (36.8 C)  TempSrc: Oral  SpO2: 98%  Weight: 118 lb 12.8 oz (53.9 kg)     Body mass index is 20.39 kg/m.   Physical Exam:   Physical Exam  Constitutional: She is oriented to person, place, and time. She appears well-developed and well-nourished. No distress.  HENT:  Head: Normocephalic and atraumatic.  Right Ear: External ear normal.  Left Ear: External ear normal.  Nose: Nose normal.  Mouth/Throat: Oropharynx is clear and moist.  Eyes: Conjunctivae and EOM are normal. Pupils are equal, round, and reactive to light.  Neck: Normal range of motion. Neck supple. No thyromegaly present.  Cardiovascular: Normal rate, regular rhythm, normal heart sounds and intact distal pulses.  Pulmonary/Chest: Effort normal and breath sounds normal.  Abdominal: Soft. Bowel sounds are normal.  Musculoskeletal: Normal range of motion.  Lymphadenopathy:    She has no cervical adenopathy.  Neurological: She is alert and oriented  to person, place, and time.  Skin: Skin is warm and dry. Capillary refill takes less than 2 seconds.  Psychiatric: She has a normal mood and affect. Her behavior is normal.  Nursing note and vitals reviewed.   Assessment and Plan:   Patricia Underwood was seen today for follow-up.  Diagnoses and all orders for this visit:  Acute medial meniscus tear of right knee, initial encounter Comments: Upcoming arthroscopic cleanout with [employer]. March 7. Patient concerned about post-op pain control. Orders: -     Cane adjustable single point  B12 deficiency -      cyanocobalamin ((VITAMIN B-12)) injection 1,000 mcg  Adjustment insomnia Comments: Takes 1-2 prn. Orders: -     LORazepam (ATIVAN) 1 MG tablet; Take 2 tablets (2 mg total) by mouth at bedtime.  Chronic pain syndrome Comments: Current treatment as below. Plan is to wean after the summer.  Orders: -     oxyCODONE-acetaminophen (PERCOCET) 10-325 MG tablet; Take 1 tablet by mouth every 6 (six) hours as needed for pain.  Discoloration of skin of face -     tretinoin (RETIN-A) 0.025 % cream; Apply topically at bedtime. -     Ambulatory referral to Dermatology  . Reviewed expectations re: course of current medical issues. . Discussed self-management of symptoms. . Outlined signs and symptoms indicating need for more acute intervention. . Patient verbalized understanding and all questions were answered. [name] Health Maintenance issues including appropriate healthy diet, exercise, and smoking avoidance were discussed with patient. . See orders for this visit as documented in the electronic medical record. . Patient received an After Visit Summary.  Helane Rima, DO [place] 12/30/2017  Future Appointments  Date Time Provider Department Center  12/31/2017 11:40 AM Andrena Mews, DO [facility]

## 2017-12-31 ENCOUNTER — Ambulatory Visit: Payer: Self-pay

## 2017-12-31 ENCOUNTER — Encounter: Payer: Self-pay | Admitting: Sports Medicine

## 2017-12-31 ENCOUNTER — Ambulatory Visit (INDEPENDENT_AMBULATORY_CARE_PROVIDER_SITE_OTHER): Payer: Medicare Other | Admitting: Sports Medicine

## 2017-12-31 VITALS — BP 130/86 | HR 73 | Ht 64.0 in | Wt 117.8 lb

## 2017-12-31 DIAGNOSIS — M65311 Trigger thumb, right thumb: Secondary | ICD-10-CM

## 2017-12-31 DIAGNOSIS — S83241D Other tear of medial meniscus, current injury, right knee, subsequent encounter: Secondary | ICD-10-CM | POA: Diagnosis not present

## 2017-12-31 NOTE — Progress Notes (Signed)
Patricia Underwood. Patricia [facility] [phone]  Patricia Underwood - 67 y.o. female MRN [id number]  Date of birth: 04-Jan-1951  Visit Date: 12/31/2017  PCP: Helane Rima, DO   Referred by: Helane Rima, DO   Scribe for today's visit: Stevenson Clinch, CMA     SUBJECTIVE:  Patricia Underwood is here for Follow-up (R thumb pain)  08/28/17: Patricia Underwood is a new patient, referred by [name], for evaluation of RT thumb pain.  Pain has been present x 1 month.  No known injury or trauma. Pt is left hand dominant.  The pain is described as constant aching and is rated as 7/10. Nothing seems to make the pain better or worse, its just constant all the time. She feels that the finger is swollen and it locks up on her.  Therapies tried include : She has tried topical anti-inflammatory with no relief.  Other associated symptoms include: The pains does not radiate into the RT hand or arm. The pain is not present in any other finger. She has hx of trigger finger in the LT 3rd phalanx.    12/31/17: Compared to the last office visit, her previously described symptoms are worsening, thumb is locking up and is more painful than it was in October.  Current symptoms are moderate & are radiating to the palm and wrist.  She had steroid injection 08/29/27. She takes Oxycodone but this doesn't help with the pain in her thumb.    ROS Reports night time disturbances. Denies fevers, chills, or night sweats. Denies unexplained weight loss. Denies personal history of cancer. Denies changes in bowel or bladder habits. Denies recent unreported falls. Denies new or worsening dyspnea or wheezing. Denies headaches or dizziness.  Denies numbness, tingling or weakness  In the extremities.  Denies dizziness or presyncopal episodes Denies lower extremity edema     HISTORY & PERTINENT PRIOR DATA:  Prior History reviewed and updated per electronic medical record.    Significant history, findings, studies and interim changes include:  reports that she has quit smoking. She has quit using smokeless tobacco. Recent Labs    07/09/17 1119  HGBA1C 5.4   No specialty comments available. Problem  Acute Medial Meniscal Tear   MRI 11/17/2017: IMPRESSION:  1. Large horizontal tear of the medial meniscus body and posterior horn. 2. Mild patellofemoral osteoarthritis.   Trigger Finger    OBJECTIVE:  VS:  HT:5\' 4"  (162.6 cm)   WT:117 lb 12.8 oz (53.4 kg)  BMI:20.21    BP:130/86  HR:73bpm  TEMP: ( )  RESP:99 %   PHYSICAL EXAM: Constitutional: WDWN, Non-toxic appearing. Psychiatric: Alert & appropriately interactive.  Not depressed or anxious appearing. Respiratory: No increased work of breathing.  Trachea Midline Eyes: Pupils are equal.  EOM intact without nystagmus.  No scleral icterus  NEUROVASCULAR exam: No clubbing or cyanosis appreciated No significant venous stasis changes Capillary Refill: normal, less than 2 seconds   Right hand is overall well aligned.  She does have generalized bossing of the CMC and IP joints.  CMC and IP joints are pain-free to palpation.  She does have a palpable nodule that is painful over the MCP of the thumb and there is appreciable triggering.  Grip strength is intact.  Good wrist range of motion   ASSESSMENT & PLAN:   1. Trigger finger of right thumb   2. Acute medial meniscus tear of right knee, subsequent encounter    PLAN:  Acute medial meniscal tear Having surgery with Dr. Yisroel Ramming in approximately 10 days.  Trigger finger Previously had 3 months of almost complete relief of her symptoms.  Given her upcoming surgery for her knee she would like to defer any other surgical interventions at this time and would like to undergo repeat ultrasound-guided tendon sheath injection.  If any lack of improvement with this or incomplete resolution of her symptoms answered will be discussed.  (Dr.  Janee Morn)   ++++++++++++++++++++++++++++++++++++++++++++ Orders & Meds: Orders Placed This Encounter  Procedures  . [place] MSK POCT ULTRASOUND    No orders of the defined types were placed in this encounter.   ++++++++++++++++++++++++++++++++++++++++++++ Follow-up: Return in about 6 weeks (around 02/11/2018).   Pertinent documentation may be included in additional procedure notes, imaging studies, problem based documentation and patient instructions. Please see these sections of the encounter for additional information regarding this visit. CMA/ATC served as Neurosurgeon during this visit. History, Physical, and Plan performed by medical provider. Documentation and orders reviewed and attested to.      Andrena Mews, DO    [place] [facility] Physician

## 2017-12-31 NOTE — Assessment & Plan Note (Signed)
Previously had 3 months of almost complete relief of her symptoms.  Given her upcoming surgery for her knee she would like to defer any other surgical interventions at this time and would like to undergo repeat ultrasound-guided tendon sheath injection.  If any lack of improvement with this or incomplete resolution of her symptoms answered will be discussed.  (Dr. Janee Morn)

## 2017-12-31 NOTE — Assessment & Plan Note (Signed)
Having surgery with Dr. Yisroel Ramming in approximately 10 days.

## 2017-12-31 NOTE — Patient Instructions (Signed)

## 2017-12-31 NOTE — Procedures (Signed)
PROCEDURE NOTE:  Ultrasound Guided: Injection: Right first finger trigger finger tendon sheath injection Images were obtained and interpreted by myself, Gaspar Bidding, DO  Images have been saved and stored to PACS system. Images obtained on: GE S7 Ultrasound machine  ULTRASOUND FINDINGS:  Normal-appearing tendon with hypertrophy of the A1 pulley noted triggering and nodularity at the level of the [facility].  CMC joint does have degenerative spurring and a small effusion but minimally tense and no increased pain with sono palpation.  DESCRIPTION OF PROCEDURE:  The patient's clinical condition is marked by substantial pain and/or significant functional disability. Other conservative therapy has not provided relief, is contraindicated, or not appropriate. There is a reasonable likelihood that injection will significantly improve the patient's pain and/or functional impairment.  After discussing the risks, benefits and expected outcomes of the injection and all questions were reviewed and answered, the patient wished to undergo the above named procedure. Verbal consent was obtained.  The ultrasound was used to identify the target structure and adjacent neurovascular structures. The skin was then prepped in sterile fashion and the target structure was injected under direct visualization using sterile technique as below:   PREP: Alcohol, Ethel Chloride,  APPROACH: direct, single injection, 25g 1.5in. INJECTATE: 0.5cc: 1% lidocaine, 0.5cc: 0.5% marcaine, 0.5cc: 40mg /mL DepoMedrol   ASPIRATE: None   DRESSING: Band-Aid   Post procedural instructions including recommending icing and warning signs for infection were reviewed.   This procedure was well tolerated and there were no complications.     IMPRESSION: Succesful Ultrasound Guided: Injection

## 2018-01-10 ENCOUNTER — Encounter: Payer: Self-pay | Admitting: Family Medicine

## 2018-02-04 ENCOUNTER — Ambulatory Visit: Payer: Medicare Other | Admitting: Family Medicine

## 2018-02-05 ENCOUNTER — Ambulatory Visit (INDEPENDENT_AMBULATORY_CARE_PROVIDER_SITE_OTHER): Payer: Medicare Other | Admitting: Family Medicine

## 2018-02-05 ENCOUNTER — Ambulatory Visit: Payer: Medicare Other | Admitting: Family Medicine

## 2018-02-05 ENCOUNTER — Encounter: Payer: Self-pay | Admitting: Family Medicine

## 2018-02-05 VITALS — BP 128/82 | HR 105 | Temp 97.7°F | Ht 64.0 in | Wt 115.6 lb

## 2018-02-05 DIAGNOSIS — G894 Chronic pain syndrome: Secondary | ICD-10-CM

## 2018-02-05 DIAGNOSIS — G8929 Other chronic pain: Secondary | ICD-10-CM | POA: Diagnosis not present

## 2018-02-05 DIAGNOSIS — M25561 Pain in right knee: Secondary | ICD-10-CM

## 2018-02-05 DIAGNOSIS — E538 Deficiency of other specified B group vitamins: Secondary | ICD-10-CM | POA: Diagnosis not present

## 2018-02-05 MED ORDER — OXYCODONE-ACETAMINOPHEN 10-325 MG PO TABS: 1.0000 | ORAL_TABLET | Freq: Four times a day (QID) | ORAL | 0 refills | Status: DC | PRN

## 2018-02-05 MED ORDER — OXYCODONE-ACETAMINOPHEN 10-325 MG PO TABS: 1.0000 | ORAL_TABLET | Freq: Three times a day (TID) | ORAL | 0 refills | Status: DC | PRN

## 2018-02-05 MED ORDER — CYANOCOBALAMIN 1000 MCG/ML IJ SOLN
1000.0000 ug | Freq: Once | INTRAMUSCULAR | Status: AC
  Administered 2018-02-05: 1000 ug via INTRAMUSCULAR

## 2018-02-05 MED ORDER — OXYCODONE-ACETAMINOPHEN 10-325 MG PO TABS: 1.0000 | ORAL_TABLET | Freq: Four times a day (QID) | ORAL | 0 refills | Status: AC | PRN

## 2018-02-05 NOTE — Progress Notes (Signed)
Jowanna Loeffler is a 67 y.o. female is here for follow up.  History of Present Illness:   HPI: See Assessment and Plan section for Problem Based Charting of issues discussed today.  Health Maintenance Due  Topic Date Due  . Hepatitis C Screening  [birth year]  . TETANUS/TDAP  01/02/1970  . MAMMOGRAM  01/02/2001  . COLONOSCOPY  01/02/2001  . DEXA SCAN  01/03/2016   Depression screen PHQ 2/9 08/28/2017 05/26/2017  Decreased Interest 0 0  Down, Depressed, Hopeless 0 0  PHQ - 2 Score 0 0   PMHx, SurgHx, SocialHx, FamHx, Medications, and Allergies were reviewed in the Visit Navigator and updated as appropriate.   Patient Active Problem List   Diagnosis Date Noted  . Acute medial meniscal tear 11/26/2017  . Trigger finger 08/17/2017  . Low vitamin D level 07/05/2017  . B12 deficiency 07/05/2017  . Insomnia 07/05/2017  . Chronic right shoulder pain 05/30/2017  . HLD (hyperlipidemia) 05/30/2017   Social History   Tobacco Use  . Smoking status: Former Games developer  . Smokeless tobacco: Former Engineer, water Use Topics  . Alcohol use: Yes    Alcohol/week: 4.2 oz    Types: 7 Glasses of wine per week  . Drug use: No   Current Medications and Allergies:   .  albuterol (PROVENTIL HFA;VENTOLIN HFA) 108 (90 Base) MCG/ACT inhaler, Inhale 2 puffs into the lungs every 6 (six) hours as needed for wheezing or shortness of breath., Disp: 1 Inhaler, Rfl: 0 .  cholecalciferol (VITAMIN D) 1000 units tablet, Take 4,000 Units by mouth daily. , Disp: , Rfl:  .  LORazepam (ATIVAN) 1 MG tablet, Take 2 tablets (2 mg total) by mouth at bedtime., Disp: 60 tablet, Rfl: 1 .  oxyCODONE-acetaminophen (PERCOCET) 10-325 MG tablet, Take 1 tablet by mouth every 6 (six) hours as needed for pain., Disp: 120 tablet, Rfl: 0 .  oxyCODONE-acetaminophen (PERCOCET) 10-325 MG tablet, Take 1 tablet by mouth every 6 (six) hours as needed for pain., Disp: 120 tablet, Rfl: 0 .  tretinoin (RETIN-A) 0.025 % cream, Apply topically  at bedtime., Disp: 45 g, Rfl: 0   Allergies  Allergen Reactions  . Ambien [Zolpidem Tartrate]   . Flexeril [Cyclobenzaprine] Other (See Comments)    Patient unsure of reaction  . Naproxen Other (See Comments)    Patient unsure of reaction.   Review of Systems   Pertinent items are noted in the HPI. Otherwise, ROS is negative.  Vitals:   Vitals:   02/05/18 0918  BP: 128/82  Pulse: (!) 105  Temp: 97.7 F (36.5 C)  TempSrc: Oral  SpO2: 98%  Weight: 115 lb 9.6 oz (52.4 kg)  Height: 5\' 4"  (1.626 m)     Body mass index is 19.84 kg/m. Physical Exam:   Physical Exam  Constitutional: She is oriented to person, place, and time. She appears well-developed and well-nourished. No distress.  HENT:  Head: Normocephalic and atraumatic.  Right Ear: External ear normal.  Left Ear: External ear normal.  Nose: Nose normal.  Mouth/Throat: Oropharynx is clear and moist.  Eyes: Pupils are equal, round, and reactive to light. Conjunctivae and EOM are normal.  Neck: Normal range of motion. Neck supple. No thyromegaly present.  Cardiovascular: Normal rate, regular rhythm, normal heart sounds and intact distal pulses.  Pulmonary/Chest: Effort normal and breath sounds normal.  Abdominal: Soft. Bowel sounds are normal.  Musculoskeletal: Normal range of motion.  Lymphadenopathy:    She has no cervical adenopathy.  Neurological:  She is alert and oriented to person, place, and time.  Skin: Skin is warm and dry. Capillary refill takes less than 2 seconds.  Psychiatric: She has a normal mood and affect. Her behavior is normal.  Nursing note and vitals reviewed.   Assessment and Plan:   Naveen was seen today for follow-up.  Diagnoses and all orders for this visit:  Vitamin B 12 deficiency Comments: Okay vitamin B12 injection today. Orders: -     cyanocobalamin ((VITAMIN B-12)) injection 1,000 mcg  Chronic pain syndrome Comments: Current treatment as below. Plan is to wean after the  summer.  The patient will be going to visit her son for a few weeks and will need her prescriptions slightly early.  I am okay with this, so that she may obtain it prior to leaving.  Date on prescriptions are correct. Orders: -     oxyCODONE-acetaminophen (PERCOCET) 10-325 MG tablet; Take 1 tablet by mouth every 6 (six) hours as needed for pain. -     oxyCODONE-acetaminophen (PERCOCET) 10-325 MG tablet; Take 1 tablet by mouth every 6 (six) hours as needed for pain. -     oxyCODONE-acetaminophen (PERCOCET) 10-325 MG tablet; Take 1 tablet by mouth every 6 (six) hours as needed for pain.  Chronic pain of right knee Comments: The patient was considering knee surgery.  However, she has decided to hold off at this time.  Her son is getting married in a few months and she does not want    . Reviewed expectations re: course of current medical issues. . Discussed self-management of symptoms. . Outlined signs and symptoms indicating need for more acute intervention. . Patient verbalized understanding and all questions were answered. [name] Health Maintenance issues including appropriate healthy diet, exercise, and smoking avoidance were discussed with patient. . See orders for this visit as documented in the electronic medical record. . Patient received an After Visit Summary.  Helane Rima, DO [place], [facility] 02/07/2018  No future appointments.

## 2018-02-18 ENCOUNTER — Telehealth: Payer: Self-pay | Admitting: Family Medicine

## 2018-02-18 NOTE — Telephone Encounter (Signed)
Copied from CRM [id number]. Topic: Quick Communication - See Telephone Encounter >> Feb 18, 2018 10:58 AM Oneal Grout wrote: CRM for notification. See Telephone encounter for: 02/18/18. Requesting to speak with Dr Earlene Plater regarding oxyCODONE-acetaminophen (PERCOCET) 10-325 MG tablet. Will need early refill due to going out of town. That's all she would elaborate.

## 2018-02-18 NOTE — Telephone Encounter (Signed)
Called spoke to patient she would like to have filled on 4-14. I have called pharmacy and let them know.

## 2018-02-18 NOTE — Telephone Encounter (Signed)
I gave pt message pt did not ant to give me a date she just wanted to speak with the providers assistant please give her a call back and she will answer phone

## 2018-02-18 NOTE — Telephone Encounter (Signed)
Called patient not able to leave v/m. Per last office with [name] she was ok with patient refilling early to go out of town. We just know when she needs refill so that we can call pharmacy and give verbal ok to do so.

## 2018-03-16 ENCOUNTER — Other Ambulatory Visit: Payer: Self-pay | Admitting: Family Medicine

## 2018-03-16 DIAGNOSIS — F5102 Adjustment insomnia: Secondary | ICD-10-CM

## 2018-03-16 NOTE — Telephone Encounter (Signed)
No f/u ok to refill?

## 2018-04-06 ENCOUNTER — Telehealth: Payer: Self-pay | Admitting: Radiology

## 2018-04-06 NOTE — Telephone Encounter (Signed)
Ok to do referral?  

## 2018-04-06 NOTE — Telephone Encounter (Signed)
Please advise 

## 2018-04-06 NOTE — Telephone Encounter (Signed)
Copied from CRM [id number]. Topic: Referral - Request >> Apr 06, 2018  2:44 PM Windy Kalata, NT wrote: Reason for CRM: patient is calling and states she needs a referral to a audiologist in regards to her hearing aids. She would like a call once this is placed.   [facility] on [address] is where she would like the referral to be placed.

## 2018-04-07 ENCOUNTER — Telehealth: Payer: Self-pay | Admitting: Family Medicine

## 2018-04-07 NOTE — Telephone Encounter (Signed)
Okay referral.

## 2018-04-07 NOTE — Telephone Encounter (Signed)
Ok for referral?

## 2018-04-07 NOTE — Telephone Encounter (Signed)
There is already a message started on this.

## 2018-04-07 NOTE — Telephone Encounter (Signed)
See note

## 2018-04-07 NOTE — Telephone Encounter (Signed)
Copied from CRM [id number]. Topic: Referral - Request >> Apr 06, 2018  2:44 PM Windy Kalata, NT wrote: Reason for CRM: patient is calling and states she needs a referral to a audiologist in regards to her hearing aids. She would like a call once this is placed.   [facility] [place] on [address] is where she would like the referral to be placed.  >> Apr 07, 2018 10:54 AM Rudi Coco, NT wrote: Pt. Calling back to check on referral status

## 2018-04-08 ENCOUNTER — Other Ambulatory Visit: Payer: Self-pay

## 2018-04-08 DIAGNOSIS — H9190 Unspecified hearing loss, unspecified ear: Secondary | ICD-10-CM

## 2018-04-08 NOTE — Telephone Encounter (Signed)
Referral started  

## 2018-04-09 NOTE — Telephone Encounter (Signed)
Pt called to give the number and fax number of the [facility] [place]  Phone: [phone] Fax: [phone]

## 2018-04-13 NOTE — Telephone Encounter (Signed)
We have faxed the notes, insurance card and referral details to [facility]. I have called and notified the patient and provided her with their number to call. -KE

## 2018-04-16 ENCOUNTER — Ambulatory Visit: Payer: Medicare Other | Admitting: Family Medicine

## 2018-04-18 NOTE — Progress Notes (Signed)
Patricia Underwood is a 67 y.o. female is here for follow up.  History of Present Illness:   Patricia Underwood, CMA acting as scribe for Dr. Helane Rima.   HPI: Patient in office for evaluation. She has had increased flair up with her fibromyalgia she has tried acupuncture. She did not have good experience with that and would like recommendation for someone local. She also wanted to know if their are any test she should have for tick bites. She has had several in the last few months.   Son getting married this summer. Wants to meet in one month to review medications.  Not sleeping well. Ativan not helping so stopped. Ambien caused nightmares. Interested in a medication that will not be a controlled substance.   Health Maintenance Due  Topic Date Due  . Hepatitis C Screening  08/26/1951  . TETANUS/TDAP  01/02/1970  . MAMMOGRAM  01/02/2001  . DEXA SCAN  01/03/2016   Depression screen PHQ 2/9 08/28/2017 05/26/2017  Decreased Interest 0 0  Down, Depressed, Hopeless 0 0  PHQ - 2 Score 0 0   PMHx, SurgHx, SocialHx, FamHx, Medications, and Allergies were reviewed in the Visit Navigator and updated as appropriate.   Patient Active Problem List   Diagnosis Date Noted  . Opioid contract exists 04/19/2018  . Acute medial meniscal tear 11/26/2017  . Trigger finger 08/17/2017  . Low vitamin D level 07/05/2017  . B12 deficiency 07/05/2017  . Insomnia 07/05/2017  . Chronic right shoulder pain 05/30/2017  . HLD (hyperlipidemia) 05/30/2017   Social History   Tobacco Use  . Smoking status: Former Games developer  . Smokeless tobacco: Former Engineer, water Use Topics  . Alcohol use: Yes    Alcohol/week: 4.2 oz    Types: 7 Glasses of wine per week  . Drug use: No   Current Medications and Allergies:   .  albuterol (PROVENTIL HFA;VENTOLIN HFA) 108 (90 Base) MCG/ACT inhaler, Inhale 2 puffs into the lungs every 6 (six) hours as needed for wheezing or shortness of breath., Disp: 1 Inhaler, Rfl: 0 .   cholecalciferol (VITAMIN D) 1000 units tablet, Take 4,000 Units by mouth daily. , Disp: , Rfl:  .  oxyCODONE-acetaminophen (PERCOCET) 10-325 MG tablet, Take 1 tablet by mouth every 6 (six) hours as needed for pain., Disp: 120 tablet, Rfl: 0 .  tretinoin (RETIN-A) 0.025 % cream, Apply topically at bedtime., Disp: 45 g, Rfl: 0   Allergies  Allergen Reactions  . Ambien [Zolpidem Tartrate]   . Flexeril [Cyclobenzaprine] Other (See Comments)    Patient unsure of reaction  . Naproxen Other (See Comments)    Patient unsure of reaction.   Review of Systems   Pertinent items are noted in the HPI. Otherwise, ROS is negative.  Vitals:   Vitals:   04/19/18 1107  BP: 130/80  Pulse: 66  Temp: 98.1 F (36.7 C)  TempSrc: Oral  SpO2: 98%  Weight: 115 lb (52.2 kg)  Height: 5\' 4"  (1.626 m)     Body mass index is 19.74 kg/m.  Physical Exam:   Physical Exam  Constitutional: She appears well-nourished.  HENT:  Head: Normocephalic and atraumatic.  Eyes: Pupils are equal, round, and reactive to light. EOM are normal.  Neck: Normal range of motion. Neck supple.  Cardiovascular: Normal rate, regular rhythm, normal heart sounds and intact distal pulses.  Pulmonary/Chest: Effort normal.  Abdominal: Soft.  Skin: Skin is warm.  Psychiatric: She has a normal mood and affect. Her behavior is  normal.  Nursing note and vitals reviewed.  Assessment and Plan:   Patricia Underwood was seen today for follow-up.  Diagnoses and all orders for this visit:  Chronic pain syndrome Comments: Current treatment as below. Plan is to wean after the summer. Interested in acupuncture so provided names. Orders: -     oxyCODONE-acetaminophen (PERCOCET) 10-325 MG tablet; Take 1 tablet by mouth every 6 (six) hours as needed for pain.  B12 deficiency -     cyanocobalamin ((VITAMIN B-12)) injection 1,000 mcg  Opioid contract exists -     oxyCODONE-acetaminophen (PERCOCET) 10-325 MG tablet; Take 1 tablet by mouth every 6  (six) hours as needed for pain.  Adjustment insomnia Comments: Trial Trazodone. Orders: -     traZODone (DESYREL) 50 MG tablet; Take 0.5-1 tablets (25-50 mg total) by mouth at bedtime as needed for sleep.    . Reviewed expectations re: course of current medical issues. . Discussed self-management of symptoms. . Outlined signs and symptoms indicating need for more acute intervention. . Patient verbalized understanding and all questions were answered. [name] Health Maintenance issues including appropriate healthy diet, exercise, and smoking avoidance were discussed with patient. . See orders for this visit as documented in the electronic medical record. . Patient received an After Visit Summary.  CMA served as Neurosurgeon during this visit. History, Physical, and Plan performed by medical provider. The above documentation has been reviewed and is accurate and complete. Helane Rima, D.O.  Helane Rima, DO [place], [facility] 04/19/2018

## 2018-04-19 ENCOUNTER — Ambulatory Visit (INDEPENDENT_AMBULATORY_CARE_PROVIDER_SITE_OTHER): Payer: Medicare Other | Admitting: Family Medicine

## 2018-04-19 ENCOUNTER — Encounter: Payer: Self-pay | Admitting: Family Medicine

## 2018-04-19 VITALS — BP 130/80 | HR 66 | Temp 98.1°F | Ht 64.0 in | Wt 115.0 lb

## 2018-04-19 DIAGNOSIS — Z79891 Long term (current) use of opiate analgesic: Secondary | ICD-10-CM

## 2018-04-19 DIAGNOSIS — Z0289 Encounter for other administrative examinations: Secondary | ICD-10-CM

## 2018-04-19 DIAGNOSIS — G894 Chronic pain syndrome: Secondary | ICD-10-CM

## 2018-04-19 DIAGNOSIS — E538 Deficiency of other specified B group vitamins: Secondary | ICD-10-CM

## 2018-04-19 DIAGNOSIS — F5102 Adjustment insomnia: Secondary | ICD-10-CM | POA: Diagnosis not present

## 2018-04-19 MED ORDER — CYANOCOBALAMIN 1000 MCG/ML IJ SOLN
1000.0000 ug | Freq: Once | INTRAMUSCULAR | Status: AC
  Administered 2018-04-19: 1000 ug via INTRAMUSCULAR

## 2018-04-19 MED ORDER — OXYCODONE-ACETAMINOPHEN 10-325 MG PO TABS: 1.0000 | ORAL_TABLET | Freq: Four times a day (QID) | ORAL | 0 refills | Status: AC | PRN

## 2018-04-19 MED ORDER — TRAZODONE HCL 50 MG PO TABS: 25.0000 mg | ORAL_TABLET | Freq: Every evening | ORAL | 3 refills | Status: DC | PRN

## 2018-04-28 ENCOUNTER — Telehealth: Payer: Self-pay | Admitting: Family Medicine

## 2018-04-28 ENCOUNTER — Other Ambulatory Visit: Payer: Self-pay

## 2018-04-28 DIAGNOSIS — Z1231 Encounter for screening mammogram for malignant neoplasm of breast: Secondary | ICD-10-CM

## 2018-04-28 NOTE — Telephone Encounter (Signed)
See note

## 2018-04-28 NOTE — Telephone Encounter (Signed)
fyi

## 2018-04-28 NOTE — Telephone Encounter (Signed)
Called patient and she has decided that she does not want dermatolgy appointment. She will continue to use the cream that [name] prescribed for her age spots. She does need her mammogram ordered. She would like to go to the breast center. I gave her their contact information.

## 2018-04-28 NOTE — Telephone Encounter (Signed)
[name] wanted me to see if you could call patient. Referral was put in back on 2/25. The referral was for skin color changes. From the notes in the referral looks like it was not received by office until 6/18 after we had faxed a few times.

## 2018-04-28 NOTE — Telephone Encounter (Signed)
Please order mammogram

## 2018-04-28 NOTE — Telephone Encounter (Signed)
Order placed

## 2018-04-28 NOTE — Telephone Encounter (Signed)
Copied from CRM [id number]. Topic: Quick Communication - See Telephone Encounter >> Apr 28, 2018  9:41 AM Lorrine Kin, NT wrote: CRM for notification. See Telephone encounter for: 04/28/18. Patient calling and states that she received a call from a dermatology office. States that they had her information and said that Dr Earlene Plater has sent a referral. Patient was unaware of the referral or what it was concerning. Please advise. CB#:[phone]

## 2018-05-20 DIAGNOSIS — M4326 Fusion of spine, lumbar region: Secondary | ICD-10-CM | POA: Diagnosis not present

## 2018-05-20 DIAGNOSIS — M545 Low back pain: Secondary | ICD-10-CM | POA: Diagnosis not present

## 2018-05-21 ENCOUNTER — Encounter: Payer: Self-pay | Admitting: Family Medicine

## 2018-05-21 ENCOUNTER — Ambulatory Visit (INDEPENDENT_AMBULATORY_CARE_PROVIDER_SITE_OTHER): Payer: Medicare Other | Admitting: Family Medicine

## 2018-05-21 VITALS — BP 128/66 | HR 66 | Temp 98.1°F | Ht 64.0 in | Wt 118.0 lb

## 2018-05-21 DIAGNOSIS — G894 Chronic pain syndrome: Secondary | ICD-10-CM | POA: Diagnosis not present

## 2018-05-21 DIAGNOSIS — F5102 Adjustment insomnia: Secondary | ICD-10-CM

## 2018-05-21 DIAGNOSIS — Z0289 Encounter for other administrative examinations: Secondary | ICD-10-CM | POA: Diagnosis not present

## 2018-05-21 DIAGNOSIS — E538 Deficiency of other specified B group vitamins: Secondary | ICD-10-CM

## 2018-05-21 DIAGNOSIS — Z79891 Long term (current) use of opiate analgesic: Secondary | ICD-10-CM

## 2018-05-21 MED ORDER — HYDROCODONE-ACETAMINOPHEN 10-325 MG PO TABS: 1.0000 | ORAL_TABLET | Freq: Four times a day (QID) | ORAL | 0 refills | Status: AC | PRN

## 2018-05-21 MED ORDER — OXYCODONE-ACETAMINOPHEN 10-325 MG PO TABS: 1.0000 | ORAL_TABLET | Freq: Four times a day (QID) | ORAL | 0 refills | Status: DC | PRN

## 2018-05-21 MED ORDER — LORAZEPAM 2 MG PO TABS: 2.0000 mg | ORAL_TABLET | Freq: Every day | ORAL | 1 refills | Status: DC

## 2018-05-21 MED ORDER — CYANOCOBALAMIN 1000 MCG/ML IJ SOLN
1000.0000 ug | Freq: Once | INTRAMUSCULAR | Status: AC
  Administered 2018-05-21: 1000 ug via INTRAMUSCULAR

## 2018-05-21 NOTE — Progress Notes (Signed)
Patricia Underwood is a 67 y.o. female is here for follow up.  History of Present Illness:   [name] CMA acting as scribe for [name].  HPI: Patient comes in today for a month follow up for her insomnia. She was prescribed Trazodone at the last visit and this did not work for her. She stated that she took the medication for four days and could not tell a difference.    Health Maintenance Due  Topic Date Due  . Hepatitis C Screening  1951/05/02  . TETANUS/TDAP  01/02/1970  . MAMMOGRAM  01/02/2001  . DEXA SCAN  01/03/2016   Depression screen PHQ 2/9 08/28/2017 05/26/2017  Decreased Interest 0 0  Down, Depressed, Hopeless 0 0  PHQ - 2 Score 0 0   PMHx, SurgHx, SocialHx, FamHx, Medications, and Allergies were reviewed in the Visit Navigator and updated as appropriate.   Patient Active Problem List   Diagnosis Date Noted  . Opioid contract exists 04/19/2018  . Acute medial meniscal tear 11/26/2017  . Trigger finger 08/17/2017  . Low vitamin D level 07/05/2017  . B12 deficiency 07/05/2017  . Insomnia 07/05/2017  . Chronic right shoulder pain 05/30/2017  . HLD (hyperlipidemia) 05/30/2017   Social History   Tobacco Use  . Smoking status: Former Games developer  . Smokeless tobacco: Former Engineer, water Use Topics  . Alcohol use: Yes    Alcohol/week: 4.2 oz    Types: 7 Glasses of wine per week  . Drug use: No   Current Medications and Allergies:   Current Outpatient Medications:  .  albuterol (PROVENTIL HFA;VENTOLIN HFA) 108 (90 Base) MCG/ACT inhaler, Inhale 2 puffs into the lungs every 6 (six) hours as needed for wheezing or shortness of breath., Disp: 1 Inhaler, Rfl: 0 .  cholecalciferol (VITAMIN D) 1000 units tablet, Take 4,000 Units by mouth daily. , Disp: , Rfl:  .  traZODone (DESYREL) 50 MG tablet, Take 0.5-1 tablets (25-50 mg total) by mouth at bedtime as needed for sleep., Disp: 30 tablet, Rfl: 3 .  tretinoin (RETIN-A) 0.025 % cream, Apply topically at bedtime., Disp:  45 g, Rfl: 0 .  HYDROcodone-acetaminophen (NORCO) 10-325 MG tablet, Take 1 tablet by mouth every 6 (six) hours as needed for severe pain., Disp: 120 tablet, Rfl: 0 .  LORazepam (ATIVAN) 2 MG tablet, Take 1 tablet (2 mg total) by mouth at bedtime., Disp: 90 tablet, Rfl: 1 .  [START ON 06/21/2018] oxyCODONE-acetaminophen (PERCOCET) 10-325 MG tablet, Take 1 tablet by mouth every 6 (six) hours as needed for pain., Disp: 120 tablet, Rfl: 0 .  [START ON 07/22/2018] oxyCODONE-acetaminophen (PERCOCET) 10-325 MG tablet, Take 1 tablet by mouth every 6 (six) hours as needed for pain., Disp: 120 tablet, Rfl: 0   Allergies  Allergen Reactions  . Ambien [Zolpidem Tartrate]   . Flexeril [Cyclobenzaprine] Other (See Comments)    Patient unsure of reaction  . Naproxen Other (See Comments)    Patient unsure of reaction.   Review of Systems   Pertinent items are noted in the HPI. Otherwise, ROS is negative.  Vitals:   Vitals:   05/21/18 1054  BP: 128/66  Pulse: 66  Temp: 98.1 F (36.7 C)  TempSrc: Oral  SpO2: 98%  Weight: 118 lb (53.5 kg)  Height: 5\' 4"  (1.626 m)     Body mass index is 20.25 kg/m.  Physical Exam:   Physical Exam  Constitutional: She appears well-nourished.  HENT:  Head: Normocephalic and atraumatic.  Eyes: Pupils are  equal, round, and reactive to light. EOM are normal.  Neck: Normal range of motion. Neck supple.  Cardiovascular: Normal rate, regular rhythm, normal heart sounds and intact distal pulses.  Pulmonary/Chest: Effort normal.  Abdominal: Soft.  Skin: Skin is warm.  Psychiatric: She has a normal mood and affect. Her behavior is normal.  Nursing note and vitals reviewed.   Assessment and Plan:   Holden was seen today for follow-up.  Diagnoses and all orders for this visit:  Adjustment insomnia Comments: Okay to use Ativan for now. Plan is to decrease over the next 6 months.   B12 deficiency -     cyanocobalamin ((VITAMIN B-12)) injection 1,000  mcg  Opioid contract exists Comments: Long term medications. Patient aware of risks. Plan to wean over 6 months.   Chronic pain syndrome Comments: Needs to renew Rx now. Okay with change to Hydrocodone. Will then go back to Oxycodone. Plan to wean over 6 months.  Other orders -     LORazepam (ATIVAN) 2 MG tablet; Take 1 tablet (2 mg total) by mouth at bedtime. -     HYDROcodone-acetaminophen (NORCO) 10-325 MG tablet; Take 1 tablet by mouth every 6 (six) hours as needed for severe pain. -     oxyCODONE-acetaminophen (PERCOCET) 10-325 MG tablet; Take 1 tablet by mouth every 6 (six) hours as needed for pain. -     oxyCODONE-acetaminophen (PERCOCET) 10-325 MG tablet; Take 1 tablet by mouth every 6 (six) hours as needed for pain.    . Reviewed expectations re: course of current medical issues. . Discussed self-management of symptoms. . Outlined signs and symptoms indicating need for more acute intervention. . Patient verbalized understanding and all questions were answered. [name] Health Maintenance issues including appropriate healthy diet, exercise, and smoking avoidance were discussed with patient. . See orders for this visit as documented in the electronic medical record. . Patient received an After Visit Summary.  Helane Rima, DO [place], [facility] 05/21/2018  Future Appointments  Date Time Provider Department Center  08/16/2018 11:00 AM Helane Rima, DO [facility]   CMA served as scribe during this visit. History, Physical, and Plan performed by medical provider. The above documentation has been reviewed and is accurate and complete. Helane Rima, D.O.

## 2018-05-26 ENCOUNTER — Other Ambulatory Visit: Payer: Self-pay | Admitting: Orthopaedic Surgery

## 2018-05-26 DIAGNOSIS — M545 Low back pain, unspecified: Secondary | ICD-10-CM

## 2018-05-31 ENCOUNTER — Ambulatory Visit (INDEPENDENT_AMBULATORY_CARE_PROVIDER_SITE_OTHER): Payer: Medicare Other | Admitting: Family Medicine

## 2018-05-31 ENCOUNTER — Encounter: Payer: Self-pay | Admitting: Family Medicine

## 2018-05-31 VITALS — BP 140/78 | HR 85 | Temp 98.4°F | Ht 64.0 in | Wt 113.0 lb

## 2018-05-31 DIAGNOSIS — G894 Chronic pain syndrome: Secondary | ICD-10-CM | POA: Diagnosis not present

## 2018-05-31 DIAGNOSIS — E2839 Other primary ovarian failure: Secondary | ICD-10-CM

## 2018-05-31 DIAGNOSIS — Z7189 Other specified counseling: Secondary | ICD-10-CM

## 2018-05-31 DIAGNOSIS — H60331 Swimmer's ear, right ear: Secondary | ICD-10-CM

## 2018-05-31 DIAGNOSIS — Z7184 Encounter for health counseling related to travel: Secondary | ICD-10-CM

## 2018-05-31 MED ORDER — OXYCODONE HCL 10 MG PO TABS: 10.0000 mg | ORAL_TABLET | Freq: Four times a day (QID) | ORAL | 0 refills | Status: AC

## 2018-05-31 MED ORDER — NEOMYCIN-POLYMYXIN-HC 3.5-10000-1 OT SUSP: 3.0000 [drp] | Freq: Four times a day (QID) | OTIC | 0 refills | Status: DC

## 2018-05-31 MED ORDER — AMOXICILLIN 875 MG PO TABS: 875.0000 mg | ORAL_TABLET | Freq: Two times a day (BID) | ORAL | 0 refills | Status: DC

## 2018-05-31 NOTE — Progress Notes (Signed)
Patricia Underwood is a 67 y.o. female here for an acute visit.  History of Present Illness:   Barnie Mort, CMA acting as scribe for Dr. Helane Rima.   HPI: Patient in office for right ear pressure. She has noticed decreased hearing in that ear as well. No pain.   Norco provided at last visit so that she could pick up Rx prior to going out of town next week. Unfortunately, it is causing headaches.   PMHx, SurgHx, SocialHx, Medications, and Allergies were reviewed in the Visit Navigator and updated as appropriate.  Current Medications:   Current Outpatient Medications:  .  albuterol (PROVENTIL HFA;VENTOLIN HFA) 108 (90 Base) MCG/ACT inhaler, Inhale 2 puffs into the lungs every 6 (six) hours as needed for wheezing or shortness of breath., Disp: 1 Inhaler, Rfl: 0 .  cholecalciferol (VITAMIN D) 1000 units tablet, Take 4,000 Units by mouth daily. , Disp: , Rfl:  .  HYDROcodone-acetaminophen (NORCO) 10-325 MG tablet, Take 1 tablet by mouth every 6 (six) hours as needed for severe pain., Disp: 120 tablet, Rfl: 0 .  LORazepam (ATIVAN) 2 MG tablet, Take 1 tablet (2 mg total) by mouth at bedtime., Disp: 90 tablet, Rfl: 1 .  [START ON 06/21/2018] oxyCODONE-acetaminophen (PERCOCET) 10-325 MG tablet, Take 1 tablet by mouth every 6 (six) hours as needed for pain., Disp: 120 tablet, Rfl: 0 .  [START ON 07/22/2018] oxyCODONE-acetaminophen (PERCOCET) 10-325 MG tablet, Take 1 tablet by mouth every 6 (six) hours as needed for pain., Disp: 120 tablet, Rfl: 0 .  amoxicillin (AMOXIL) 875 MG tablet, Take 1 tablet (875 mg total) by mouth 2 (two) times daily., Disp: 20 tablet, Rfl: 0 .  neomycin-polymyxin-hydrocortisone (CORTISPORIN) 3.5-10000-1 OTIC suspension, Place 3 drops into the right ear 4 (four) times daily., Disp: 10 mL, Rfl: 0 .  Oxycodone HCl 10 MG TABS, Take 1 tablet (10 mg total) by mouth 4 (four) times daily for 7 days., Disp: 28 tablet, Rfl: 0   Allergies  Allergen Reactions  . Ambien [Zolpidem  Tartrate]   . Flexeril [Cyclobenzaprine] Other (See Comments)    Patient unsure of reaction  . Naproxen Other (See Comments)    Patient unsure of reaction.   Review of Systems:   Pertinent items are noted in the HPI. Otherwise, ROS is negative.  Vitals:   Vitals:   05/31/18 1120  BP: 140/78  Pulse: 85  Temp: 98.4 F (36.9 C)  TempSrc: Oral  SpO2: 98%  Weight: 113 lb (51.3 kg)  Height: 5\' 4"  (1.626 m)     Body mass index is 19.4 kg/m.  Physical Exam:   Physical Exam  Constitutional: She appears well-nourished.  HENT:  Head: Normocephalic and atraumatic.  Right Ear: There is drainage and swelling.  Eyes: Pupils are equal, round, and reactive to light. EOM are normal.  Neck: Normal range of motion. Neck supple.  Cardiovascular: Normal rate, regular rhythm, normal heart sounds and intact distal pulses.  Pulmonary/Chest: Effort normal.  Abdominal: Soft.  Skin: Skin is warm.  Psychiatric: She has a normal mood and affect. Her behavior is normal.  Nursing note and vitals reviewed.  Assessment and Plan:   Patricia Underwood was seen today for ear fullness.  Diagnoses and all orders for this visit:  Acute swimmer's ear of right side -     neomycin-polymyxin-hydrocortisone (CORTISPORIN) 3.5-10000-1 OTIC suspension; Place 3 drops into the right ear 4 (four) times daily.  Estrogen deficiency -     DG Bone Density; Future  Travel advice  encounter -     amoxicillin (AMOXIL) 875 MG tablet; Take 1 tablet (875 mg total) by mouth 2 (two) times daily.  Chronic pain syndrome -     Oxycodone HCl 10 MG TABS; Take 1 tablet (10 mg total) by mouth 4 (four) times daily for 7 days.    . Reviewed expectations re: course of current medical issues. . Discussed self-management of symptoms. . Outlined signs and symptoms indicating need for more acute intervention. . Patient verbalized understanding and all questions were answered. [name] Health Maintenance issues including appropriate healthy diet,  exercise, and smoking avoidance were discussed with patient. . See orders for this visit as documented in the electronic medical record. . Patient received an After Visit Summary.  CMA served as Neurosurgeon during this visit. History, Physical, and Plan performed by medical provider. The above documentation has been reviewed and is accurate and complete. Helane Rima, D.O.   Helane Rima, DO [place], [facility] 06/03/2018

## 2018-06-11 ENCOUNTER — Ambulatory Visit (INDEPENDENT_AMBULATORY_CARE_PROVIDER_SITE_OTHER): Payer: Medicare Other

## 2018-06-11 ENCOUNTER — Encounter: Payer: Self-pay | Admitting: Family Medicine

## 2018-06-11 ENCOUNTER — Ambulatory Visit (INDEPENDENT_AMBULATORY_CARE_PROVIDER_SITE_OTHER): Payer: Medicare Other | Admitting: Family Medicine

## 2018-06-11 VITALS — BP 126/84 | HR 104 | Temp 98.4°F | Ht 64.0 in | Wt 111.6 lb

## 2018-06-11 DIAGNOSIS — R05 Cough: Secondary | ICD-10-CM | POA: Diagnosis not present

## 2018-06-11 DIAGNOSIS — J181 Lobar pneumonia, unspecified organism: Secondary | ICD-10-CM

## 2018-06-11 DIAGNOSIS — R059 Cough, unspecified: Secondary | ICD-10-CM

## 2018-06-11 DIAGNOSIS — J189 Pneumonia, unspecified organism: Secondary | ICD-10-CM

## 2018-06-11 DIAGNOSIS — J984 Other disorders of lung: Secondary | ICD-10-CM | POA: Diagnosis not present

## 2018-06-11 MED ORDER — OXYCODONE-ACETAMINOPHEN 10-325 MG PO TABS: 1.0000 | ORAL_TABLET | Freq: Four times a day (QID) | ORAL | 0 refills | Status: DC | PRN

## 2018-06-11 MED ORDER — CEFTRIAXONE SODIUM 1 G IJ SOLR
1.0000 g | Freq: Once | INTRAMUSCULAR | Status: AC
  Administered 2018-06-11: 1 g via INTRAMUSCULAR

## 2018-06-11 MED ORDER — PREDNISONE 5 MG PO TABS
ORAL_TABLET | ORAL | 0 refills | Status: DC
Start: 2018-06-11 — End: 2018-07-19

## 2018-06-11 MED ORDER — AZITHROMYCIN 250 MG PO TABS: ORAL_TABLET | ORAL | 0 refills | Status: DC

## 2018-06-11 MED ORDER — ALBUTEROL SULFATE (2.5 MG/3ML) 0.083% IN NEBU
2.5000 mg | INHALATION_SOLUTION | Freq: Once | RESPIRATORY_TRACT | Status: AC
  Administered 2018-06-11: 2.5 mg via RESPIRATORY_TRACT

## 2018-06-11 MED ORDER — METHYLPREDNISOLONE ACETATE 80 MG/ML IJ SUSP
80.0000 mg | Freq: Once | INTRAMUSCULAR | Status: AC
Start: 2018-06-11 — End: 2018-06-11
  Administered 2018-06-11: 80 mg via INTRAMUSCULAR

## 2018-06-11 MED ORDER — BENZONATATE 200 MG PO CAPS: 200.0000 mg | ORAL_CAPSULE | Freq: Two times a day (BID) | ORAL | 0 refills | Status: DC | PRN

## 2018-06-11 NOTE — Progress Notes (Signed)
Patricia Underwood is a 67 y.o. female here for an acute visit.  History of Present Illness:   [name] CMA acting as scribe for [name].  HPI: Patient comes in today for an acute visit. Patient is having cough congestion that has been going on for about 3 days. Patient is having some wheezing that is helped with her albuterol inhaler. Patient denies any fever. We will do a chest x ray today and do a nebulizer treatment. Patient stated that she has been at a wedding and several people was coughing on the plane.   PMHx, SurgHx, SocialHx, Medications, and Allergies were reviewed in the Visit Navigator and updated as appropriate.  Current Medications:   .  albuterol (PROVENTIL HFA;VENTOLIN HFA) 108 (90 Base) MCG/ACT inhaler, Inhale 2 puffs into the lungs every 6 (six) hours as needed for wheezing or shortness of breath., Disp: 1 Inhaler, Rfl: 0 .  cholecalciferol (VITAMIN D) 1000 units tablet, Take 4,000 Units by mouth daily. , Disp: , Rfl:  .  LORazepam (ATIVAN) 2 MG tablet, Take 1 tablet (2 mg total) by mouth at bedtime., Disp: 90 tablet, Rfl: 1 .  oxyCODONE-acetaminophen (PERCOCET) 10-325 MG tablet, Take 1 tablet by mouth every 6 (six) hours as needed for pain., Disp: 120 tablet, Rfl: 0  Allergies  Allergen Reactions  . Ambien [Zolpidem Tartrate]   . Flexeril [Cyclobenzaprine] Other (See Comments)    Patient unsure of reaction  . Naproxen Other (See Comments)    Patient unsure of reaction.   Review of Systems:   Pertinent items are noted in the HPI. Otherwise, ROS is negative.  Vitals:   Vitals:   06/11/18 0904  BP: 126/84  Pulse: (!) 104  Temp: 98.4 F (36.9 C)  TempSrc: Oral  SpO2: 97%  Weight: 111 lb 9.6 oz (50.6 kg)  Height: 5\' 4"  (1.626 m)     Body mass index is 19.16 kg/m.  Physical Exam:   Physical Exam  Constitutional: She appears well-nourished.  HENT:  Head: Normocephalic and atraumatic.  Eyes: Pupils are equal, round, and reactive to light. EOM are  normal.  Neck: Normal range of motion. Neck supple.  Cardiovascular: Normal rate, regular rhythm, normal heart sounds and intact distal pulses.  Pulmonary/Chest: Effort normal. She has wheezes. She has rhonchi in the right middle field and the right lower field.  Abdominal: Soft.  Skin: Skin is warm.  Psychiatric: She has a normal mood and affect. Her behavior is normal.  Nursing note and vitals reviewed.   Assessment and Plan:   Diagnoses and all orders for this visit:  Cough -     DG Chest 2 View; Future -     albuterol (PROVENTIL) (2.5 MG/3ML) 0.083% nebulizer solution 2.5 mg  Community acquired pneumonia of right lower lobe of lung (HCC) -     azithromycin (ZITHROMAX) 250 MG tablet; 2 po on day one, then on po until gone -     predniSONE (DELTASONE) 5 MG tablet; 6-5-4-3-2-1-off -     benzonatate (TESSALON) 200 MG capsule; Take 1 capsule (200 mg total) by mouth 2 (two) times daily as needed for cough. -     methylPREDNISolone acetate (DEPO-MEDROL) injection 80 mg -     cefTRIAXone (ROCEPHIN) injection 1 g  Other orders -     oxyCODONE-acetaminophen (PERCOCET) 10-325 MG tablet; Take 1 tablet by mouth every 6 (six) hours as needed for pain.    . Reviewed expectations re: course of current medical issues. . Discussed  self-management of symptoms. . Outlined signs and symptoms indicating need for more acute intervention. . Patient verbalized understanding and all questions were answered. [name] Health Maintenance issues including appropriate healthy diet, exercise, and smoking avoidance were discussed with patient. . See orders for this visit as documented in the electronic medical record. . Patient received an After Visit Summary.  CMA served as Neurosurgeon during this visit. History, Physical, and Plan performed by medical provider. The above documentation has been reviewed and is accurate and complete. Helane Rima, D.O.  Helane Rima, DO [place], [facility] 06/12/2018

## 2018-06-12 ENCOUNTER — Encounter: Payer: Self-pay | Admitting: Family Medicine

## 2018-06-14 ENCOUNTER — Encounter

## 2018-06-14 ENCOUNTER — Ambulatory Visit: Payer: Medicare Other | Admitting: Family Medicine

## 2018-06-21 ENCOUNTER — Ambulatory Visit
Admission: RE | Admit: 2018-06-21 | Discharge: 2018-06-21 | Disposition: A | Discharge status: Home / Self Care | Payer: Medicare Other | Source: Ambulatory Visit | Attending: Orthopaedic Surgery | Admitting: Orthopaedic Surgery

## 2018-06-21 DIAGNOSIS — M545 Low back pain, unspecified: Secondary | ICD-10-CM

## 2018-06-28 ENCOUNTER — Other Ambulatory Visit: Payer: Self-pay

## 2018-06-28 ENCOUNTER — Telehealth: Payer: Self-pay | Admitting: Family Medicine

## 2018-06-28 DIAGNOSIS — J181 Lobar pneumonia, unspecified organism: Principal | ICD-10-CM

## 2018-06-28 DIAGNOSIS — J189 Pneumonia, unspecified organism: Secondary | ICD-10-CM

## 2018-06-28 MED ORDER — AZITHROMYCIN 250 MG PO TABS: ORAL_TABLET | ORAL | 0 refills | Status: DC

## 2018-06-28 NOTE — Telephone Encounter (Signed)
Okay to send in Rx Zpak again.

## 2018-06-28 NOTE — Telephone Encounter (Signed)
Called pt. Back. Reports she is not coughing as bad as she was. Is still very tired, exhausted. Does not want to come in this week. Request to have another round of antibiotic and steroid called in .States "I know it takes awhile to get over pneumonia. Please advise pt.

## 2018-06-28 NOTE — Telephone Encounter (Signed)
See request °

## 2018-06-28 NOTE — Telephone Encounter (Signed)
Copied from CRM [id number]. Topic: Quick Communication - See Telephone Encounter >> Jun 28, 2018  2:44 PM Patricia Underwood wrote: Pt is calling back to check on [name] advice and if a medication will be called in for feeling bad and coughing symptoms.

## 2018-06-28 NOTE — Telephone Encounter (Signed)
Please advise 

## 2018-06-28 NOTE — Telephone Encounter (Signed)
See note

## 2018-06-28 NOTE — Telephone Encounter (Signed)
Patricia Underwood has taken care of this. Please see previous message.

## 2018-06-28 NOTE — Telephone Encounter (Signed)
Patient informed that refill has been sent in.

## 2018-06-28 NOTE — Telephone Encounter (Signed)
Copied from CRM [id number]. Topic: Quick Communication - Rx Refill/Question >> Jun 28, 2018 12:19 PM Zada Girt, Lumin L wrote: Medication: azithromyacin, prednisone, tessalon (would like more called in for pnuemonia symptoms, still has cough and is out of prednisone and azithromycin)  Has the patient contacted their pharmacy? Yes.   (Agent: If no, request that the patient contact the pharmacy for the refill.) (Agent: If yes, when and what did the pharmacy advise?)  Preferred Pharmacy (with phone number or street name): [facility] [address], [place] - [address] [place] [address]  Phone: [phone] Fax: [phone]  Agent: Please be advised that RX refills may take up to 3 business days. We ask that you follow-up with your pharmacy.  Patient would like a call back once sent to pharmacy.

## 2018-06-30 ENCOUNTER — Encounter: Payer: Self-pay | Admitting: Family Medicine

## 2018-06-30 LAB — COLOGUARD

## 2018-07-01 DIAGNOSIS — M545 Low back pain: Secondary | ICD-10-CM | POA: Diagnosis not present

## 2018-07-02 ENCOUNTER — Telehealth: Payer: Self-pay

## 2018-07-02 NOTE — Telephone Encounter (Signed)
Fax received from [employer] has been cancelled becuause it has been in active for over 365 days. Fax sent to scan

## 2018-07-14 DIAGNOSIS — H903 Sensorineural hearing loss, bilateral: Secondary | ICD-10-CM | POA: Diagnosis not present

## 2018-07-15 DIAGNOSIS — M545 Low back pain: Secondary | ICD-10-CM | POA: Diagnosis not present

## 2018-07-15 DIAGNOSIS — M4326 Fusion of spine, lumbar region: Secondary | ICD-10-CM | POA: Diagnosis not present

## 2018-07-15 DIAGNOSIS — M5136 Other intervertebral disc degeneration, lumbar region: Secondary | ICD-10-CM | POA: Diagnosis not present

## 2018-07-15 DIAGNOSIS — M47816 Spondylosis without myelopathy or radiculopathy, lumbar region: Secondary | ICD-10-CM | POA: Diagnosis not present

## 2018-07-19 ENCOUNTER — Ambulatory Visit (INDEPENDENT_AMBULATORY_CARE_PROVIDER_SITE_OTHER): Payer: Medicare Other

## 2018-07-19 ENCOUNTER — Encounter: Payer: Self-pay | Admitting: Family Medicine

## 2018-07-19 ENCOUNTER — Ambulatory Visit (INDEPENDENT_AMBULATORY_CARE_PROVIDER_SITE_OTHER): Payer: Medicare Other | Admitting: Family Medicine

## 2018-07-19 VITALS — BP 122/84 | HR 59 | Temp 98.5°F | Ht 64.0 in | Wt 114.4 lb

## 2018-07-19 DIAGNOSIS — R21 Rash and other nonspecific skin eruption: Secondary | ICD-10-CM

## 2018-07-19 DIAGNOSIS — R059 Cough, unspecified: Secondary | ICD-10-CM

## 2018-07-19 DIAGNOSIS — J4521 Mild intermittent asthma with (acute) exacerbation: Secondary | ICD-10-CM | POA: Diagnosis not present

## 2018-07-19 DIAGNOSIS — R05 Cough: Secondary | ICD-10-CM

## 2018-07-19 MED ORDER — TRIAMCINOLONE ACETONIDE 0.1 % EX CREA: 1.0000 "application " | TOPICAL_CREAM | Freq: Two times a day (BID) | CUTANEOUS | 0 refills | Status: DC

## 2018-07-19 MED ORDER — PREDNISONE 20 MG PO TABS: ORAL_TABLET | ORAL | 0 refills | Status: DC

## 2018-07-19 NOTE — Progress Notes (Signed)
Subjective:  Patricia Underwood is a 67 y.o. year old very pleasant female patient who presents for/with See problem oriented charting ROS- itching on neck. No chest pain. Shortness of breath which responds to albuterol. Complains of cough and some wheeze   Past Medical History-  Patient Active Problem List   Diagnosis Date Noted  . Opioid contract exists 04/19/2018  . Acute medial meniscal tear 11/26/2017  . Trigger finger 08/17/2017  . Low vitamin D level 07/05/2017  . B12 deficiency 07/05/2017  . Insomnia 07/05/2017  . Chronic right shoulder pain 05/30/2017  . HLD (hyperlipidemia) 05/30/2017    Medications- reviewed and updated Current Outpatient Medications  Medication Sig Dispense Refill  . albuterol (PROVENTIL HFA;VENTOLIN HFA) 108 (90 Base) MCG/ACT inhaler Inhale 2 puffs into the lungs every 6 (six) hours as needed for wheezing or shortness of breath. 1 Inhaler 0  . cholecalciferol (VITAMIN D) 1000 units tablet Take 4,000 Units by mouth daily.     [name] LORazepam (ATIVAN) 2 MG tablet Take 1 tablet (2 mg total) by mouth at bedtime. 90 tablet 1  . [START ON 07/22/2018] oxyCODONE-acetaminophen (PERCOCET) 10-325 MG tablet Take 1 tablet by mouth every 6 (six) hours as needed for pain. 120 tablet 0  . benzonatate (TESSALON) 200 MG capsule Take 1 capsule (200 mg total) by mouth 2 (two) times daily as needed for cough. (Patient not taking: Reported on 07/19/2018) 30 capsule 0  . predniSONE (DELTASONE) 20 MG tablet Take 2 pills for 3 days, 1 pill for 4 days 10 tablet 0  . triamcinolone cream (KENALOG) 0.1 % Apply 1 application topically 2 (two) times daily. For 7-10 days maximum 80 g 0   No current facility-administered medications for this visit.     Objective: BP 122/84 (BP Location: Left Arm, Patient Position: Sitting, Cuff Size: Normal)   Pulse (!) 59   Temp 98.5 F (36.9 C) (Oral)   Ht 5\' 4"  (1.626 m)   Wt 114 lb 6.4 oz (51.9 kg)   SpO2 97%   BMI 19.64 kg/m  Gen: NAD, resting  comfortably CV: RRR no murmurs rubs or gallops Lungs: diffuse mild wheeze intermittently,  no crackles, rhonchi Abdomen: soft/nontender/nondistended/normal bowel sounds.  Ext: no edema Skin: warm, dry, ob back of neck appears to have 4 small excoriated areas with minimal surrounding erythema. On left inner thigh there is pinhead size red lesion- mild surrounding redness. Then there is some bruising beneath this- does not seem very tender on exam.   Assessment/Plan:  Cough - Plan: DG Chest 2 View  Rash  Mild intermittent asthma with exacerbation S: RML pneumonia 06/11/18 diagnosed by Dr. 08/11/18 x-ray. She was treated with tessalon, prednisone, azithromycin. Called in and 2nd antibiotic (azithromycin on 06/28/18)- got close to normal but never fully resolved. Always had some cough  In last week, has started to feel worse, Cough, runny nose, feels run down. Not short of breath other than last night and improved with albuterol. No fever though has felt subjectively warm at times. Generally doesn't have to use albuterol- used last night/using more frequent. Has noted wheezing. Denies new recent sick contacts  Got a bite last Thursday she believes- hurt in leg. Feels like slightly getting worse- some more bruising in leg- sees pinpoint red lesion which is new. She is worried about brown recluse bite  Thought she had some bites on back of neck- Scratching back of neck- almost 4 months ago. hasnt tried anything for it yet.  A/P: from avs "This  could be an upper respiratory infection but I do think its triggered your asthma- use prednisone daily for next 7 days. Can use albuterol every 8 hours for next 24 hours scheduled or every 4 hours as needed in between those doses.   Lets get x-ray to make sure pneumonia has cleared- if not then we will need to use another antibiotic course  For rash- schedule this cream daily for 7-10 days. If doesn't clear up- return to see [name]. Please avoid  scratching if you can "  Patient in itch scratch cycle for back of neck- will try to calm this off with triamcinolone- as noted should see [name] if symptoms fail to improve. For spot on leg- I told her did not look like brown recluse bite to me though does look like could be a bite- we reviewed return precautions such as expanding redness, worsening pain, failure to improve in next few weeks.   Future Appointments  Date Time Provider Department Center  08/16/2018 11:00 AM Helane Rima, DO [facility]   Lab/Order associations: Cough - Plan: DG Chest 2 View  Rash  Mild intermittent asthma with exacerbation  Meds ordered this encounter  Medications  . predniSONE (DELTASONE) 20 MG tablet    Sig: Take 2 pills for 3 days, 1 pill for 4 days    Dispense:  10 tablet    Refill:  0  . triamcinolone cream (KENALOG) 0.1 %    Sig: Apply 1 application topically 2 (two) times daily. For 7-10 days maximum    Dispense:  80 g    Refill:  0    Return precautions advised.  Tana Conch, MD

## 2018-07-19 NOTE — Patient Instructions (Addendum)
Please stop by x-ray before you go  This could be an upper respiratory infection but I do think its triggered your asthma- use prednisone daily for next 7 days. Can use albuterol every 8 hours for next 24 hours scheduled or every 4 hours as needed in between those doses.   Lets get x-ray to make sure pneumonia has cleared- if not then we will need to use another antibiotic course  For rash- schedule this cream daily for 7-10 days. If doesn't clear up- return to see [name]. Please avoid scratching if you can

## 2018-07-20 ENCOUNTER — Telehealth: Payer: Self-pay | Admitting: Family Medicine

## 2018-07-20 DIAGNOSIS — J4521 Mild intermittent asthma with (acute) exacerbation: Secondary | ICD-10-CM

## 2018-07-20 DIAGNOSIS — R05 Cough: Secondary | ICD-10-CM

## 2018-07-20 DIAGNOSIS — R059 Cough, unspecified: Secondary | ICD-10-CM

## 2018-07-20 DIAGNOSIS — H903 Sensorineural hearing loss, bilateral: Secondary | ICD-10-CM | POA: Insufficient documentation

## 2018-07-20 MED ORDER — DOXYCYCLINE HYCLATE 100 MG PO TABS: 100.0000 mg | ORAL_TABLET | Freq: Two times a day (BID) | ORAL | 0 refills | Status: DC

## 2018-07-20 NOTE — Telephone Encounter (Signed)
Patient given all results. Doxycycline sent to the pharmacy. Ct and Pulmonology referral placed.

## 2018-07-20 NOTE — Telephone Encounter (Signed)
Hunter sent you the Xray to look over.

## 2018-07-20 NOTE — Telephone Encounter (Signed)
Copied from CRM [id number]. Topic: Quick Communication - Other Results >> Jul 20, 2018  9:37 AM [name] wrote: Pt is calling and would like chest xray result >> Jul 20, 2018 11:06 AM [name] wrote: Pt is calling back in for results from xray that was done

## 2018-07-20 NOTE — Telephone Encounter (Signed)
See note

## 2018-07-20 NOTE — Telephone Encounter (Signed)
Patient has called several times.

## 2018-07-20 NOTE — Telephone Encounter (Signed)
Okay to read xray results to patient. Please order CT chest for further evaluation. Reassure her that okay. Okay Doxy 100 BID x 10 days. Follow up with Pulmonology - may need referral.

## 2018-07-20 NOTE — Telephone Encounter (Signed)
Left message for patient to return call.

## 2018-07-20 NOTE — Telephone Encounter (Signed)
Follow up  Pt calling back in regards to her CT results and pt verbalized she is not happy with the Dr.  Rock Nephew verbalized she will CB at 1:15pm if no one gives her a call.  Please f/u

## 2018-07-20 NOTE — Telephone Encounter (Signed)
Copied from CRM [id number]. Topic: Quick Communication - Other Results >> Jul 20, 2018  9:37 AM [name] wrote: Pt is calling and would like chest xray result

## 2018-07-20 NOTE — Telephone Encounter (Signed)
Patient returned Jamie's call, please call patient when you can. Thanks

## 2018-07-22 ENCOUNTER — Telehealth: Payer: Self-pay | Admitting: Family Medicine

## 2018-07-22 ENCOUNTER — Ambulatory Visit (INDEPENDENT_AMBULATORY_CARE_PROVIDER_SITE_OTHER)
Admission: RE | Admit: 2018-07-22 | Discharge: 2018-07-22 | Disposition: A | Discharge status: Home / Self Care | Payer: Medicare Other | Source: Ambulatory Visit | Attending: Family Medicine | Admitting: Family Medicine

## 2018-07-22 DIAGNOSIS — R05 Cough: Secondary | ICD-10-CM

## 2018-07-22 DIAGNOSIS — J4521 Mild intermittent asthma with (acute) exacerbation: Secondary | ICD-10-CM

## 2018-07-22 DIAGNOSIS — R059 Cough, unspecified: Secondary | ICD-10-CM

## 2018-07-22 DIAGNOSIS — R918 Other nonspecific abnormal finding of lung field: Secondary | ICD-10-CM | POA: Diagnosis not present

## 2018-07-22 NOTE — Telephone Encounter (Signed)
See note

## 2018-07-22 NOTE — Telephone Encounter (Signed)
Followed up on referral made sure it was sent. Called patient and gave contact number to call for appointment.

## 2018-07-22 NOTE — Telephone Encounter (Unsigned)
Copied from CRM [id number]. Topic: Quick Communication - See Telephone Encounter >> Jul 22, 2018  9:59 AM Raquel Sarna wrote: Pt is still waiting on PT Scan appointment.  Pt hasn't heard back on scheduling.  Please call pt back this morning to let her know of the status.

## 2018-07-23 ENCOUNTER — Ambulatory Visit (INDEPENDENT_AMBULATORY_CARE_PROVIDER_SITE_OTHER): Payer: Medicare Other | Admitting: Family Medicine

## 2018-07-23 ENCOUNTER — Encounter: Payer: Self-pay | Admitting: Family Medicine

## 2018-07-23 VITALS — BP 142/78 | HR 111 | Temp 98.4°F | Ht 64.0 in | Wt 111.2 lb

## 2018-07-23 DIAGNOSIS — G894 Chronic pain syndrome: Secondary | ICD-10-CM | POA: Diagnosis not present

## 2018-07-23 DIAGNOSIS — I2510 Atherosclerotic heart disease of native coronary artery without angina pectoris: Secondary | ICD-10-CM

## 2018-07-23 DIAGNOSIS — E782 Mixed hyperlipidemia: Secondary | ICD-10-CM

## 2018-07-23 DIAGNOSIS — R05 Cough: Secondary | ICD-10-CM | POA: Diagnosis not present

## 2018-07-23 DIAGNOSIS — E538 Deficiency of other specified B group vitamins: Secondary | ICD-10-CM | POA: Diagnosis not present

## 2018-07-23 DIAGNOSIS — R059 Cough, unspecified: Secondary | ICD-10-CM

## 2018-07-23 MED ORDER — OXYCODONE-ACETAMINOPHEN 10-325 MG PO TABS: 1.0000 | ORAL_TABLET | Freq: Four times a day (QID) | ORAL | 0 refills | Status: DC | PRN

## 2018-07-23 MED ORDER — BUDESONIDE-FORMOTEROL FUMARATE 160-4.5 MCG/ACT IN AERO: 2.0000 | INHALATION_SPRAY | Freq: Two times a day (BID) | RESPIRATORY_TRACT | 3 refills | Status: DC

## 2018-07-23 MED ORDER — IPRATROPIUM-ALBUTEROL 0.5-2.5 (3) MG/3ML IN SOLN
3.0000 mL | Freq: Four times a day (QID) | RESPIRATORY_TRACT | Status: DC
  Administered 2018-07-23: 3 mL via RESPIRATORY_TRACT

## 2018-07-23 MED ORDER — CYANOCOBALAMIN 1000 MCG/ML IJ SOLN
1000.0000 ug | Freq: Once | INTRAMUSCULAR | Status: AC
  Administered 2018-07-23: 1000 ug via INTRAMUSCULAR

## 2018-07-23 NOTE — Progress Notes (Signed)
Patricia Underwood is a 67 y.o. female is here for follow up.  History of Present Illness:   [name] CMA acting as scribe for [name].  HPI: Patient comes in to follow up on her chest CT. Patient still has cough and chest congestion. She is still taking the doxycycline. No improvement in cough.  CT CHEST (07/22/18):  1. No findings to suggest interstitial lung disease.  2. Very mild air trapping indicative of mild small airways disease.  3. Multiple tiny 1-3 mm pulmonary nodules, nonspecific but statistically likely benign. No follow-up needed if patient is low-risk (and has no known or suspected primary neoplasm). Non-contrast chest CT can be considered in 12 months if patient is high-risk. This recommendation follows the consensus statement: Guidelines for Management of Incidental Pulmonary Nodules Detected on CT Images: From the Fleischner Society 2017; Radiology 2017; 284:228-243.  4. Aortic atherosclerosis, in addition to left main and 2 vessel coronary artery disease. Please note that although the presence of coronary artery calcium documents the presence of coronary artery disease, the severity of this disease and any potential stenosis cannot be assessed on this non-gated CT examination. Assessment for potential risk factor modification, dietary therapy or pharmacologic therapy may be warranted, if clinically indicated.  Aortic Atherosclerosis (ICD10-I70.0).  Health Maintenance Due  Topic Date Due  . MAMMOGRAM  01/02/2001  . DEXA SCAN  01/03/2016   Depression screen PHQ 2/9 08/28/2017 05/26/2017  Decreased Interest 0 0  Down, Depressed, Hopeless 0 0  PHQ - 2 Score 0 0   Review of Systems  Constitutional: Negative for chills and fever.  Respiratory: Positive for cough and shortness of breath.   Cardiovascular: Negative for chest pain, palpitations and leg swelling.  Gastrointestinal: Negative for heartburn, nausea and vomiting.  Skin: Negative for rash.    Neurological: Negative for dizziness and headaches.   PMHx, SurgHx, SocialHx, FamHx, Medications, and Allergies were reviewed in the Visit Navigator and updated as appropriate.   Patient Active Problem List   Diagnosis Date Noted  . 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 Opioid contract exists 04/19/2018  . Acute medial meniscal tear 11/26/2017  . Trigger finger 08/17/2017  . Low vitamin D level 07/05/2017  . B12 deficiency 07/05/2017  . Insomnia 07/05/2017  . Chronic right shoulder pain 05/30/2017  . HLD (hyperlipidemia) 05/30/2017   Social History   Tobacco Use  . Smoking status: Former Games developer  . Smokeless tobacco: Former Engineer, water Use Topics  . Alcohol use: Yes    Alcohol/week: 7.0 standard drinks    Types: 7 Glasses of wine per week  . Drug use: No   Current Medications and Allergies:   .  cholecalciferol (VITAMIN D) 1000 units tablet, Take 4,000 Units by mouth daily. , Disp: , Rfl:  .  LORazepam (ATIVAN) 2 MG tablet, Take 1 tablet (2 mg total) by mouth at bedtime., Disp: 90 tablet, Rfl: 1 .  oxyCODONE-acetaminophen (PERCOCET) 10-325 MG tablet, Take 1 tablet by mouth every 6 (six) hours as needed for pain., Disp: 120 tablet, Rfl: 0   Allergies  Allergen Reactions  . Ambien [Zolpidem Tartrate]   . Flexeril [Cyclobenzaprine] Other (See Comments)    Patient unsure of reaction  . Naproxen Other (See Comments)    Patient unsure of reaction.   Review of Systems   Pertinent items are noted in the HPI. Otherwise, ROS is negative.  Vitals:   Vitals:   07/23/18 1451  BP: (!) 142/78  Pulse: (!) 111  Temp: 98.4 F (36.9 C)  TempSrc: Oral  SpO2: 97%  Weight: 111 lb 3.2 oz (50.4 kg)  Height: 5\' 4"  (1.626 m)     Body mass index is 19.09 kg/m.  Physical Exam:   Physical Exam  Constitutional: She appears well-nourished.  HENT:  Head: Normocephalic and atraumatic.  Eyes: Pupils are equal, round, and reactive to light. EOM are normal.   Neck: Normal range of motion. Neck supple.  Cardiovascular: Normal rate, regular rhythm, normal heart sounds and intact distal pulses.  Pulmonary/Chest: Effort normal.  Abdominal: Soft.  Skin: Skin is warm.  Psychiatric: She has a normal mood and affect. Her behavior is normal.  Nursing note and vitals reviewed.  Assessment and Plan:   Armentha was seen today for cough.  Diagnoses and all orders for this visit:  Cough Comments: Consistent with RAD. Discussed CT scan. Nebulizer in office with relief. Will provide Symbicort x 1 month. Orders: -     ipratropium-albuterol (DUONEB) 0.5-2.5 (3) MG/3ML nebulizer solution 3 mL -     budesonide-formoterol (SYMBICORT) 160-4.5 MCG/ACT inhaler; Inhale 2 puffs into the lungs 2 (two) times daily.  Mixed hyperlipidemia  Chronic pain syndrome Comments: Continue current treatment for now. Plan is to wean off over the next year. She wants to be off opioids in order to care for grandchildren. Orders: -     oxyCODONE-acetaminophen (PERCOCET) 10-325 MG tablet; Take 1 tablet by mouth every 6 (six) hours as needed for pain.  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 To Cardiology for evaluation urgently.  Orders: -     Ambulatory referral to Cardiology   . Reviewed expectations re: course of current medical issues. . Discussed self-management of symptoms. . Outlined signs and symptoms indicating need for more acute intervention. . Patient verbalized understanding and all questions were answered. [facility] Health Maintenance issues including appropriate healthy diet, exercise, and smoking avoidance were discussed with patient. . See orders for this visit as documented in the electronic medical record. . Patient received an  After Visit Summary.  [name], DO [place] 07/28/2018

## 2018-07-26 ENCOUNTER — Telehealth: Payer: Self-pay | Admitting: Family Medicine

## 2018-07-26 ENCOUNTER — Telehealth: Payer: Self-pay

## 2018-07-26 NOTE — Telephone Encounter (Signed)
Is it ok for patient to refill early.

## 2018-07-26 NOTE — Telephone Encounter (Signed)
Can you please schedule?

## 2018-07-26 NOTE — Telephone Encounter (Signed)
Referral in. Please make appointment and call.

## 2018-07-26 NOTE — Telephone Encounter (Signed)
Looks like appt made. Has patient been notified?

## 2018-07-26 NOTE — Telephone Encounter (Signed)
Lea scheduled and notified patient.

## 2018-07-26 NOTE — Telephone Encounter (Signed)
Please advise on cardiology referral.  We placed referral for pulmonology, but do not see anything for cardiology.

## 2018-07-26 NOTE — Telephone Encounter (Signed)
Copied from CRM [id number]. Topic: General - Other >> Jul 26, 2018 10:49 AM [name] wrote: Reason for CRM: pt was unable to refill oxycodone. Pt needs someone to call [facility] [address]. Pt left rx with pharmacy

## 2018-07-26 NOTE — Telephone Encounter (Signed)
Copied from CRM [id number]. Topic: Referral - Request >> Jul 26, 2018 10:47 AM [name] wrote: Reason for CRM:pt saw dr Earlene Plater on Friday. Pt is calling to check on the status of cardiologist referral

## 2018-07-26 NOTE — Telephone Encounter (Signed)
Spoke with pharmacy and gave permission to fill early.

## 2018-07-26 NOTE — Telephone Encounter (Signed)
Okay to tell pharmacy okay to fill.

## 2018-07-26 NOTE — Telephone Encounter (Signed)
See note

## 2018-07-28 ENCOUNTER — Encounter: Payer: Self-pay | Admitting: Cardiology

## 2018-07-28 ENCOUNTER — Ambulatory Visit (INDEPENDENT_AMBULATORY_CARE_PROVIDER_SITE_OTHER): Payer: Medicare Other | Admitting: Cardiology

## 2018-07-28 ENCOUNTER — Encounter: Payer: Self-pay | Admitting: Family Medicine

## 2018-07-28 VITALS — BP 122/74 | HR 62 | Wt 105.1 lb

## 2018-07-28 DIAGNOSIS — Z7189 Other specified counseling: Secondary | ICD-10-CM

## 2018-07-28 DIAGNOSIS — E7801 Familial hypercholesterolemia: Secondary | ICD-10-CM

## 2018-07-28 DIAGNOSIS — I2510 Atherosclerotic heart disease of native coronary artery without angina pectoris: Secondary | ICD-10-CM | POA: Diagnosis not present

## 2018-07-28 DIAGNOSIS — I2584 Coronary atherosclerosis due to calcified coronary lesion: Secondary | ICD-10-CM | POA: Diagnosis not present

## 2018-07-28 DIAGNOSIS — E7800 Pure hypercholesterolemia, unspecified: Secondary | ICD-10-CM | POA: Diagnosis not present

## 2018-07-28 MED ORDER — ATORVASTATIN CALCIUM 20 MG PO TABS: 20.0000 mg | ORAL_TABLET | Freq: Every day | ORAL | 3 refills | Status: DC

## 2018-07-28 NOTE — Patient Instructions (Addendum)
Medication Instructions: Your Physician recommend you make the following changes to your medication. Start: Atorvastatin 20 mg daily  If you need a refill on your cardiac medications before your next appointment, please call your pharmacy.   Labwork: Your physician recommends that you return for lab work in today ( Lipid)   Procedures/Testing: None  Follow-Up: Your physician wants you to follow-up in 3 months with Dr. Cristal Deer  Special Instructions:    Thank you for choosing [facility]!!

## 2018-07-28 NOTE — Progress Notes (Signed)
Cardiology Office Note:    Date:  07/28/2018   ID:  Patricia Underwood, DOB 09/20/1951, MRN [id number]  PCP:  Helane Rima, DO  Cardiologist:  Jodelle Red, MD PhD  Referring MD: Helane Rima, DO   CC: heart results from CT scan  History of Present Illness:    Patricia Underwood is a 67 y.o. female with a hx of hyperlipidemia who is seen as a new consult at the request of Helane Rima, DO for the evaluation and management of coronary artery calcium seen on CT scan.  Patient concerns: had a ct scan, not sure what it said. Reviewed CT scan with the patient, noted to have calcium deposits in coronary vessels. She wants to know what that means.  No chest pain. Does walk routinely, climbs stairs routinely, no issues. Getting over a recent episode of bronchitis. Otherwise no shortness of breath. No PND, orthopnea, syncope, palpitations. No prior history of heart issues.  Was on cholesterol medication up until three years ago, she believes she tolerated it without issue. She cannot remember the amount (per chart, appears to have been atorvastatin. States 40 mg, she thinks she took 20 mg).  She stopped because she didn't think she needed it any more. However, she is very concerned about her family history and would like to be on prevention medications.  SH: Former smoker.  Very active, walks frequently. EtoH not more than 1 glass of wine on average/night. FH: says "all" of her family members have high cholesterol. One of her children has a Tchol >300. Family members with MI, CVA.  Past Medical History:  Diagnosis Date  . Chronic right shoulder pain 05/30/2017  . HLD (hyperlipidemia) 05/30/2017    History reviewed. No pertinent surgical history.  Current Medications: Current Outpatient Medications on File Prior to Visit  Medication Sig  . albuterol (PROVENTIL HFA;VENTOLIN HFA) 108 (90 Base) MCG/ACT inhaler Inhale 2 puffs into the lungs every 6 (six) hours as needed for wheezing or shortness  of breath.  . budesonide-formoterol (SYMBICORT) 160-4.5 MCG/ACT inhaler Inhale 2 puffs into the lungs 2 (two) times daily.  [name] LORazepam (ATIVAN) 2 MG tablet Take 1 tablet (2 mg total) by mouth at bedtime. (Patient taking differently: Take 2 mg by mouth as needed. )  . oxyCODONE-acetaminophen (PERCOCET) 10-325 MG tablet Take 1 tablet by mouth every 6 (six) hours as needed for pain.  . cholecalciferol (VITAMIN D) 1000 units tablet Take 4,000 Units by mouth daily.   [name] triamcinolone cream (KENALOG) 0.1 % Apply 1 application topically 2 (two) times daily. For 7-10 days maximum (Patient not taking: Reported on 07/28/2018)   Current Facility-Administered Medications on File Prior to Visit  Medication  . ipratropium-albuterol (DUONEB) 0.5-2.5 (3) MG/3ML nebulizer solution 3 mL     Allergies:   Ambien [zolpidem tartrate]; Flexeril [cyclobenzaprine]; and Naproxen   Social History   Socioeconomic History  . Marital status: Married    Spouse name: Not on file  . Number of children: Not on file  . Years of education: Not on file  . Highest education level: Not on file  Occupational History  . Not on file  Social Needs  . Financial resource strain: Not on file  . Food insecurity:    Worry: Not on file    Inability: Not on file  . Transportation needs:    Medical: Not on file    Non-medical: Not on file  Tobacco Use  . Smoking status: Former Games developer  . Smokeless tobacco: Never Used  Substance and Sexual Activity  . Alcohol use: Yes    Alcohol/week: 7.0 standard drinks    Types: 7 Glasses of wine per week  . Drug use: No  . Sexual activity: Not Currently  Lifestyle  . Physical activity:    Days per week: Not on file    Minutes per session: Not on file  . Stress: Not on file  Relationships  . Social connections:    Talks on phone: Not on file    Gets together: Not on file    Attends religious service: Not on file    Active member of club or organization: Not on file    Attends  meetings of clubs or organizations: Not on file    Relationship status: Not on file  Other Topics Concern  . Not on file  Social History Narrative  . Not on file     Family History: The patient's FH notable for: says "all" of her family members have high cholesterol. One of her children has a Tchol >300. Family members with MI, CVA.  ROS:   Please see the history of present illness.  Additional pertinent ROS: Review of Systems  Constitutional: Negative for chills, fever and malaise/fatigue.  HENT: Positive for hearing loss. Negative for ear pain.   Eyes: Negative for blurred vision and pain.  Respiratory: Positive for cough and sputum production. Negative for shortness of breath.   Cardiovascular: Negative for chest pain, palpitations, orthopnea, claudication and leg swelling.  Gastrointestinal: Negative for abdominal pain, blood in stool and melena.  Genitourinary: Negative for dysuria and hematuria.  Musculoskeletal: Positive for myalgias. Negative for falls.  Skin: Negative for rash.  Neurological: Negative for focal weakness and loss of consciousness.  Endo/Heme/Allergies: Does not bruise/bleed easily.   EKGs/Labs/Other Studies Reviewed:    The following studies were reviewed today: CT chest 07/22/18 (cardiac findings only) FINDINGS: Cardiovascular: Heart size is normal. There is no significant pericardial fluid, thickening or pericardial calcification. There is aortic atherosclerosis, as well as atherosclerosis of the great vessels of the mediastinum and the coronary arteries, including calcified atherosclerotic plaque in the left main, left anterior descending and left circumflex coronary arteries.  EKG:  EKG is ordered today.  The ekg ordered today demonstrates normal sinus rhythm  Recent Labs: No results found for requested labs within last 8760 hours.  Recent Lipid Panel    Component Value Date/Time   CHOL 251 (H) 07/07/2017 0852   TRIG 94.0 07/07/2017 0852    HDL 71.70 07/07/2017 0852   CHOLHDL 3 07/07/2017 0852   VLDL 18.8 07/07/2017 0852   LDLCALC 160 (H) 07/07/2017 0852    Physical Exam:    VS:  BP 122/74   Pulse 62   Wt 105 lb 1.6 oz (47.7 kg)   BMI 18.04 kg/m     Wt Readings from Last 3 Encounters:  07/28/18 105 lb 1.6 oz (47.7 kg)  07/23/18 111 lb 3.2 oz (50.4 kg)  07/19/18 114 lb 6.4 oz (51.9 kg)     GEN: Well nourished, well developed in no acute distress HEENT: Normal NECK: No JVD; No carotid bruits LYMPHATICS: No lymphadenopathy CARDIAC: regular rhythm, normal S1 and S2, no murmurs, rubs, gallops. Radial and DP pulses 2+ bilaterally. RESPIRATORY:  Clear to auscultation except for rhonchi in upper lung fields ABDOMEN: Soft, non-tender, non-distended MUSCULOSKELETAL:  No edema; No deformity  SKIN: Warm and dry NEUROLOGIC:  Alert and oriented x 3 PSYCHIATRIC:  Normal affect   ASSESSMENT:    1.  Coronary artery calcification   2. Pure hypercholesterolemia   3. Counseling on health promotion and disease prevention   4. Familial hypercholesterolemia    PLAN:    1. Coronary artery calcification: She does not have any anginal symptoms, and her ECG does not show evidence of prior MI. We discussed at length that this does not tell us anything about stenosis, just the presence of calcium. Discussed that we cannot get a definitive calcium score from this particular scan, but the presence of calcium suggests the need for statin therapy.  -no indication for additional testing given lack of symptoms -risk factor modification, as below  2. Hypercholesterolemia, likely familial hypercholesterolemia: Was on statin in the past, but has been off for about 3 years. Her family history is very strong, including one of her children having a Tchol >300.  -restart atorvastatin, start at 20 mg. Recheck lipids in 6-12 weeks to monitor response. If LDL not decreased (would expect about a 40% decrease, to around 100 mg/dL) consider increasing  dose of atorvastatin. Initial goal LDL is 100, but ideally would like to get to 70 or lower over time. Her risk is not captured in the ASCVD risk calculator, as her family history and coronary calcium increase her risk stratification.  The 10-year ASCVD risk score Denman George DC Montez Hageman., et al., 2013) is: 6.4%   Values used to calculate the score:     Age: 63 years     Sex: Female     Is Non-Hispanic African American: No     Diabetic: No     Tobacco smoker: No     Systolic Blood Pressure: 122 mmHg     Is BP treated: No     HDL Cholesterol: 71.7 mg/dL     Total Cholesterol: 251 mg/dL   Prevention: -recommend heart healthy/Mediterranean diet, with whole grains, fruits, vegetable, fish, lean meats, nuts, and olive oil. Limit salt. -recommend moderate walking, 3-5 times/week for 30-50 minutes each session. Aim for at least 150 minutes.week. Goal should be pace of 3 miles/hours, or walking 1.5 miles in 30 minutes -recommend avoidance of tobacco products. Avoid excess alcohol. -Additional risk factor control:  -Diabetes: A1c is 5.4. No history of diabetes.  -Lipids: as above  -Blood pressure control: controlled off medications  -Weight: underweight  Plan for follow up: 3 months, recheck lipids at that time.  Medication Adjustments/Labs and Tests Ordered: Current medicines are reviewed at length with the patient today.  Concerns regarding medicines are outlined above.  Orders Placed This Encounter  Procedures  . Lipid panel  . EKG 12-Lead   Meds ordered this encounter  Medications  . atorvastatin (LIPITOR) 20 MG tablet    Sig: Take 1 tablet (20 mg total) by mouth daily.    Dispense:  90 tablet    Refill:  3    Patient Instructions  Medication Instructions: Your Physician recommend you make the following changes to your medication. Start: Atorvastatin 20 mg daily  If you need a refill on your cardiac medications before your next appointment, please call your pharmacy.   Labwork: Your  physician recommends that you return for lab work in today ( Lipid)   Procedures/Testing: None  Follow-Up: Your physician wants you to follow-up in 3 months with Dr. Cristal Deer  Special Instructions:    Thank you for choosing [facility] at [facility]!!       Signed, Jodelle Red, MD PhD 07/28/2018 7:22 PM    [facility]

## 2018-07-29 LAB — LIPID PANEL
CHOLESTEROL TOTAL: 272 mg/dL — AB (ref 100–199)
Chol/HDL Ratio: 3.4 ratio (ref 0.0–4.4)
HDL: 79 mg/dL (ref >39)
LDL Calculated: 158 mg/dL — ABNORMAL HIGH (ref 0–99)
Triglycerides: 174 mg/dL — ABNORMAL HIGH (ref 0–149)
VLDL CHOLESTEROL CAL: 35 mg/dL (ref 5–40)

## 2018-07-30 ENCOUNTER — Other Ambulatory Visit: Payer: Self-pay

## 2018-07-30 DIAGNOSIS — E782 Mixed hyperlipidemia: Secondary | ICD-10-CM

## 2018-08-03 DIAGNOSIS — M545 Low back pain: Secondary | ICD-10-CM | POA: Diagnosis not present

## 2018-08-03 DIAGNOSIS — G8929 Other chronic pain: Secondary | ICD-10-CM | POA: Diagnosis not present

## 2018-08-16 ENCOUNTER — Ambulatory Visit: Payer: Medicare Other | Admitting: Family Medicine

## 2018-08-19 NOTE — Progress Notes (Signed)
Karley Pho is a 67 y.o. female is here for follow up.  History of Present Illness:   HPI:   1. Chronic pain syndrome. Pt comes for pain med refill. Medication is being used for the ongoing treatment of: back and shoulder pain. She will be seeing a new pain specialist soon.  Harm or misuse noted? no Inappropriate side effects (including oversedation/altered mental status)? no [place] prescription drug database checked? yes Are non-opiod therapies optimized? yes   2. Fatigue. Ongoing. Symptoms began several months ago. The patient feels the fatigue began with: cold intolerance, exercise intolerance and symptoms of arthritis. Symptoms of her fatigue have been diffuse soft tissue aches and pains and general malaise. Patient describes the following psychological symptoms: stress with significant other. Patient denies constipation, fever, GI blood loss, unusual rashes and witnessed or suspected sleep apnea. Symptoms have stabilized. Symptom severity: struggles to carry out day to day responsibilities.. Previous visits for this problem: none.    Health Maintenance Due  Topic Date Due  . MAMMOGRAM  01/02/2001  . DEXA SCAN  01/03/2016   Depression screen PHQ 2/9 08/28/2017 05/26/2017  Decreased Interest 0 0  Down, Depressed, Hopeless 0 0  PHQ - 2 Score 0 0   PMHx, SurgHx, SocialHx, FamHx, Medications, and Allergies were reviewed in the Visit Navigator and updated as appropriate.   Patient Active Problem List   Diagnosis Date Noted  . 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 Opioid contract exists 04/19/2018  . Acute medial meniscal tear 11/26/2017  . Trigger finger 08/17/2017  . Low vitamin D level 07/05/2017  . B12 deficiency 07/05/2017  . Insomnia 07/05/2017  . Chronic right shoulder pain 05/30/2017  . HLD (hyperlipidemia) 05/30/2017   Social History   Tobacco Use  . Smoking status: Former Games developer  . Smokeless tobacco: Never Used  Substance Use Topics  . Alcohol  use: Yes    Alcohol/week: 7.0 standard drinks    Types: 7 Glasses of wine per week  . Drug use: No   Current Medications and Allergies:   .  albuterol (PROVENTIL HFA;VENTOLIN HFA) 108 (90 Base) MCG/ACT inhaler, Inhale 2 puffs into the lungs every 6 (six) hours as needed for wheezing or shortness of breath., Disp: 1 Inhaler, Rfl: 0 .  atorvastatin (LIPITOR) 20 MG tablet, Take 1 tablet (20 mg total) by mouth daily., Disp: 90 tablet, Rfl: 3 .  budesonide-formoterol (SYMBICORT) 160-4.5 MCG/ACT inhaler, Inhale 2 puffs into the lungs 2 (two) times daily., Disp: 1 Inhaler, Rfl: 3 .  cholecalciferol (VITAMIN D) 1000 units tablet, Take 4,000 Units by mouth daily. , Disp: , Rfl:  .  LORazepam (ATIVAN) 2 MG tablet, Take 1 tablet (2 mg total) by mouth at bedtime., Disp: 90 tablet, Rfl: 1 .  oxyCODONE-acetaminophen (PERCOCET) 10-325 MG tablet, Take 1 tablet by mouth every 6 (six) hours as needed for pain., Disp: 120 tablet, Rfl: 0 .  triamcinolone cream (KENALOG) 0.1 %, Apply 1 application topically 2 (two) times daily. For 7-10 days maximum, Disp: 80 g, Rfl: 0   Allergies  Allergen Reactions  . Ambien [Zolpidem Tartrate]   . Flexeril [Cyclobenzaprine] Other (See Comments)    Patient unsure of reaction  . Naproxen Other (See Comments)    Patient unsure of reaction.   Review of Systems   Pertinent items are noted in the HPI. Otherwise, ROS is negative.  Vitals:   Vitals:   08/20/18 1052  BP: 134/76  Pulse: 98  Temp: 98.6 F (  37 C)  TempSrc: Oral  SpO2: 98%  Weight: 113 lb (51.3 kg)  Height: 5\' 4"  (1.626 m)     Body mass index is 19.4 kg/m.  Physical Exam:   Physical Exam  Constitutional: She appears well-nourished.  HENT:  Head: Normocephalic and atraumatic.  Eyes: Pupils are equal, round, and reactive to light. EOM are normal.  Neck: Normal range of motion. Neck supple.  Cardiovascular: Normal rate, regular rhythm, normal heart sounds and intact distal pulses.    Pulmonary/Chest: Effort normal.  Abdominal: Soft.  Skin: Skin is warm.  Psychiatric: She has a normal mood and affect. Her behavior is normal.  Nursing note and vitals reviewed.  Assessment and Plan:   Meaghann was seen today for follow-up.  Diagnoses and all orders for this visit:  Chronic pain syndrome Comments: Continue current treatment for now. Plan is to wean off over the next year. She wants to be off opioids in order to care for grandchildren. Orders: -     LORazepam (ATIVAN) 2 MG tablet; Take 1 tablet (2 mg total) by mouth at bedtime. -     oxyCODONE-acetaminophen (PERCOCET) 10-325 MG tablet; Take 1 tablet by mouth every 6 (six) hours as needed for pain. -     oxyCODONE-acetaminophen (PERCOCET) 10-325 MG tablet; Take 1 tablet by mouth every 4 (four) hours as needed for pain. -     oxyCODONE-acetaminophen (PERCOCET) 10-325 MG tablet; Take 1 tablet by mouth every 8 (eight) hours as needed for pain.  Fatigue, unspecified type -     CBC with Differential/Platelet; Future -     Comprehensive metabolic panel; Future -     Vitamin B12; Future -     TSH; Future  Mixed hyperlipidemia -     Comprehensive metabolic panel; Future -     Lipid panel; Future  B12 deficiency -     Vitamin B12; Future -     cyanocobalamin ((VITAMIN B-12)) injection 1,000 mcg  Low vitamin D level  Dizziness -     [place] Carotid Duplex Bilateral  Coronary artery disease involving native coronary artery of native heart without angina pectoris -     US Carotid Duplex Bilateral  Adjustment insomnia -     LORazepam (ATIVAN) 2 MG tablet; Take 1 tablet (2 mg total) by mouth at bedtime.   . Reviewed expectations re: course of current medical issues. . Discussed self-management of symptoms. . Outlined signs and symptoms indicating need for more acute intervention. . Patient verbalized understanding and all questions were answered. [place] Health Maintenance issues including appropriate healthy diet, exercise,  and smoking avoidance were discussed with patient. . See orders for this visit as documented in the electronic medical record. . Patient received an After Visit Summary.  [name], DO [place] 08/20/2018

## 2018-08-20 ENCOUNTER — Ambulatory Visit (INDEPENDENT_AMBULATORY_CARE_PROVIDER_SITE_OTHER): Payer: Medicare Other | Admitting: Family Medicine

## 2018-08-20 ENCOUNTER — Encounter: Payer: Self-pay | Admitting: Family Medicine

## 2018-08-20 VITALS — BP 134/76 | HR 98 | Temp 98.6°F | Ht 64.0 in | Wt 113.0 lb

## 2018-08-20 DIAGNOSIS — R42 Dizziness and giddiness: Secondary | ICD-10-CM | POA: Diagnosis not present

## 2018-08-20 DIAGNOSIS — E782 Mixed hyperlipidemia: Secondary | ICD-10-CM

## 2018-08-20 DIAGNOSIS — I2584 Coronary atherosclerosis due to calcified coronary lesion: Secondary | ICD-10-CM

## 2018-08-20 DIAGNOSIS — R7989 Other specified abnormal findings of blood chemistry: Secondary | ICD-10-CM | POA: Diagnosis not present

## 2018-08-20 DIAGNOSIS — F5102 Adjustment insomnia: Secondary | ICD-10-CM

## 2018-08-20 DIAGNOSIS — E538 Deficiency of other specified B group vitamins: Secondary | ICD-10-CM

## 2018-08-20 DIAGNOSIS — R5383 Other fatigue: Secondary | ICD-10-CM

## 2018-08-20 DIAGNOSIS — G894 Chronic pain syndrome: Secondary | ICD-10-CM | POA: Diagnosis not present

## 2018-08-20 DIAGNOSIS — I2510 Atherosclerotic heart disease of native coronary artery without angina pectoris: Secondary | ICD-10-CM

## 2018-08-20 MED ORDER — OXYCODONE-ACETAMINOPHEN 10-325 MG PO TABS: 1.0000 | ORAL_TABLET | ORAL | 0 refills | Status: DC | PRN

## 2018-08-20 MED ORDER — CYANOCOBALAMIN 1000 MCG/ML IJ SOLN
1000.0000 ug | Freq: Once | INTRAMUSCULAR | Status: AC
  Administered 2018-08-20: 1000 ug via INTRAMUSCULAR

## 2018-08-20 MED ORDER — LORAZEPAM 2 MG PO TABS: 2.0000 mg | ORAL_TABLET | Freq: Every day | ORAL | 1 refills | Status: DC

## 2018-08-20 MED ORDER — OXYCODONE-ACETAMINOPHEN 10-325 MG PO TABS: 1.0000 | ORAL_TABLET | Freq: Four times a day (QID) | ORAL | 0 refills | Status: DC | PRN

## 2018-08-20 MED ORDER — OXYCODONE-ACETAMINOPHEN 10-325 MG PO TABS: 1.0000 | ORAL_TABLET | Freq: Three times a day (TID) | ORAL | 0 refills | Status: DC | PRN

## 2018-08-30 ENCOUNTER — Encounter: Payer: Self-pay | Admitting: Pulmonary Disease

## 2018-08-30 ENCOUNTER — Ambulatory Visit (INDEPENDENT_AMBULATORY_CARE_PROVIDER_SITE_OTHER): Payer: Medicare Other | Admitting: Pulmonary Disease

## 2018-08-30 VITALS — BP 108/70 | HR 66 | Ht 64.5 in | Wt 115.4 lb

## 2018-08-30 DIAGNOSIS — J410 Simple chronic bronchitis: Secondary | ICD-10-CM | POA: Diagnosis not present

## 2018-08-30 NOTE — Patient Instructions (Addendum)
Bronchitis with Hoarseness As we discussed, there can be multiple causes for hoarseness including allergies and reflux. Bronchitis can also be associated with underlying lung disease --START claritin 10 mg daily for allergy symptoms --START omeprazole 20 mg twice a day for reflux symptoms --We will order breathing test (full pulmonary function test) before you next visit in 3 months.  Follow-up in 3 months

## 2018-08-30 NOTE — Progress Notes (Signed)
Synopsis: Referred in 08/2018 for   Subjective:   PATIENT ID: Patricia Underwood GENDER: female DOB: 25-Jan-1951, MRN: [id number]   HPI  Chief Complaint  Patient presents with  . Consult    Had pneumonia Aug 2019 and Bronchitis Sept 2019 - Feeling better - no cough.  PCP felt she needed Pulm Evaluation   Patricia Underwood is a 66 year old female with no significant medical history who presents as a new patient consult for persistent hoarseness.  For the last year she has developed worsening hoarseness that has gradually developed.  Per patient, her voice is not what it used to be.  She denies any triggers that worsen the character of her voice and has not noticed anything that will improve it.  Even when resting her voice, the hoarseness has remained.  A few months ago she developed cough and she was treated for pneumonia in August and bronchitis in September. Was placed on medrol pack without relief. She was then prednisone 40 mg and improved.  Her cough and congestion have resolved.  No longer has cough but still has nasal congestion. Previously had allergies with animals but denies this is a trigger now.  PCP prescribed bronchodilator for which she tried for 3 days without relief.  Her son who has asthma did not think the inhalers helped him so she stopped.   On review of PCP note 07/23/18: Nebulizer in office with him to moderate relief. Given Symbicort x 1 month. Referred to Pulmonology and Cardiology on this visit  Denies recent fevers, chills, weight change, cough, hemoptysis, dyspnea.  Reports nasal congestion but has not tried any over-the-counter.  Has heartburn.  Denies dysphagia, nausea, abdominal pain.  ACT: 0  Remote smoker. Social smoker. Quit 20 years ago.  Previously employed as an Financial planner  I have personally reviewed patient's past medical/family/social history, allergies, current medications.  Past Medical History:  Diagnosis Date  . Chronic right  shoulder pain 05/30/2017  . HLD (hyperlipidemia) 05/30/2017     No family history on file.   Social History   Socioeconomic History  . Marital status: Married    Spouse name: Not on file  . Number of children: Not on file  . Years of education: Not on file  . Highest education level: Not on file  Occupational History  . Not on file  Social Needs  . Financial resource strain: Not on file  . Food insecurity:    Worry: Not on file    Inability: Not on file  . Transportation needs:    Medical: Not on file    Non-medical: Not on file  Tobacco Use  . Smoking status: Former Games developer  . Smokeless tobacco: Never Used  Substance and Sexual Activity  . Alcohol use: Yes    Alcohol/week: 7.0 standard drinks    Types: 7 Glasses of wine per week  . Drug use: No  . Sexual activity: Not Currently  Lifestyle  . Physical activity:    Days per week: Not on file    Minutes per session: Not on file  . Stress: Not on file  Relationships  . Social connections:    Talks on phone: Not on file    Gets together: Not on file    Attends religious service: Not on file    Active member of club or organization: Not on file    Attends meetings of clubs or organizations: Not on file    Relationship status: Not on  file  . Intimate partner violence:    Fear of current or ex partner: Not on file    Emotionally abused: Not on file    Physically abused: Not on file    Forced sexual activity: Not on file  Other Topics Concern  . Not on file  Social History Narrative  . Not on file     Allergies  Allergen Reactions  . Ambien [Zolpidem Tartrate]   . Flexeril [Cyclobenzaprine] Other (See Comments)    Patient unsure of reaction  . Naproxen Other (See Comments)    Patient unsure of reaction.     Outpatient Medications Prior to Visit  Medication Sig Dispense Refill  . albuterol (PROVENTIL HFA;VENTOLIN HFA) 108 (90 Base) MCG/ACT inhaler Inhale 2 puffs into the lungs every 6 (six) hours as needed for  wheezing or shortness of breath. 1 Inhaler 0  . atorvastatin (LIPITOR) 20 MG tablet Take 1 tablet (20 mg total) by mouth daily. 90 tablet 3  . cholecalciferol (VITAMIN D) 1000 units tablet Take 4,000 Units by mouth daily.     [name] LORazepam (ATIVAN) 2 MG tablet Take 1 tablet (2 mg total) by mouth at bedtime. 90 tablet 1  . oxyCODONE-acetaminophen (PERCOCET) 10-325 MG tablet Take 1 tablet by mouth every 6 (six) hours as needed for pain. 120 tablet 0  . [START ON 10/20/2018] oxyCODONE-acetaminophen (PERCOCET) 10-325 MG tablet Take 1 tablet by mouth every 8 (eight) hours as needed for pain. 120 tablet 0  . [START ON 09/20/2018] oxyCODONE-acetaminophen (PERCOCET) 10-325 MG tablet Take 1 tablet by mouth every 4 (four) hours as needed for pain. 120 tablet 0  . budesonide-formoterol (SYMBICORT) 160-4.5 MCG/ACT inhaler Inhale 2 puffs into the lungs 2 (two) times daily. (Patient not taking: Reported on 08/30/2018) 1 Inhaler 3  . triamcinolone cream (KENALOG) 0.1 % Apply 1 application topically 2 (two) times daily. For 7-10 days maximum (Patient not taking: Reported on 08/30/2018) 80 g 0   Facility-Administered Medications Prior to Visit  Medication Dose Route Frequency Provider Last Rate Last Dose  . ipratropium-albuterol (DUONEB) 0.5-2.5 (3) MG/3ML nebulizer solution 3 mL  3 mL Nebulization Q6H Helane Rima, DO   3 mL at 07/23/18 1604    Review of Systems  Constitutional: Negative for chills, diaphoresis, fever, malaise/fatigue and weight loss.  HENT: Positive for congestion and sore throat. Negative for sinus pain.        Hoarse throat  Respiratory: Positive for cough. Negative for hemoptysis, sputum production, shortness of breath and wheezing.   Cardiovascular: Negative for chest pain, orthopnea, leg swelling and PND.  Gastrointestinal: Positive for heartburn. Negative for abdominal pain and nausea.  Genitourinary: Negative for frequency.  Musculoskeletal: Negative for myalgias.  Skin: Negative for  rash.  Neurological: Negative for dizziness, weakness and headaches.  Endo/Heme/Allergies: Does not bruise/bleed easily.  All other systems reviewed and are negative.     Objective:  Physical Exam   Vitals:   08/30/18 1134  BP: 108/70  Pulse: 66  SpO2: 97%  Weight: 115 lb 6.4 oz (52.3 kg)  Height: 5' 4.5" (1.638 m)   Gen: thin, no acute distress HENT: NCAT, OP clear, neck supple without masses Eyes: PERRL, EOMi Lymph: no cervical lymphadenopathy PULM: CTA B CV: RRR, no mgr, no JVD GI: BS+, soft, nontender, no hsm Derm: no rash or skin breakdown MSK: normal bulk and tone Neuro: A&Ox4, CN II-XII intact, strength 5/5 in all 4 extremities Psyche: normal mood and affect   CBC    Component  Value Date/Time   WBC 7.9 07/07/2017 0852   RBC 4.31 07/07/2017 0852   HGB 13.9 07/07/2017 0852   HCT 42.5 07/07/2017 0852   PLT 375.0 07/07/2017 0852   MCV 98.5 07/07/2017 0852   MCHC 32.7 07/07/2017 0852   RDW 13.2 07/07/2017 0852   LYMPHSABS 2.9 07/07/2017 0852   MONOABS 0.6 07/07/2017 0852   EOSABS 0.3 07/07/2017 0852   BASOSABS 0.1 07/07/2017 0852     Chest imaging: CT Chest 07/22/18 1. No findings to suggest interstitial lung disease. 2. Very mild air trapping indicative of mild small airways disease. 3. Multiple tiny 1-3 mm pulmonary nodules, nonspecific but statistically likely benign. No follow-up needed if patient is low-risk (and has no known or suspected primary neoplasm). Non-contrast chest CT can be considered in 12 months if patient is high-risk. This recommendation follows the consensus statement: Guidelines for Management of Incidental Pulmonary Nodules Detected on CT Images: From the Fleischner Society 2017; Radiology 2017; 284:228-243. 4. Aortic atherosclerosis, in addition to left main and 2 vessel coronary artery disease. Please note that although the presence of coronary artery calcium documents the presence of coronary artery disease, the severity of  this disease and any potential stenosis cannot be assessed on this non-gated CT examination. Assessment for potential risk factor modification, dietary therapy or pharmacologic therapy may be warranted, if clinically indicated.  PFT: None on file  Echo: None on file  I have personally reviewed the above labs, images and tests noted above.    Assessment & Plan:   Simple chronic bronchitis (HCC) - Plan: Pulmonary function test  Discussion: 67 year old female former smoker who presents as a new consult for hoarseness.  Discussed multiple etiologies for the symptom.  Denies dyspnea, chronic cough or wheezing.  However does have postnasal drip and reported reflux that could contribute.  CT chest does not suggest restrictive lung disease.  Bronchitis with hoarseness As we discussed, there can be multiple causes for hoarseness including chronic laryngitis which can be related to allergies and reflux. Bronchitis can also be associated with underlying lung disease. --START claritin 10 mg daily for allergy symptoms --START omeprazole 20 mg twice a day for reflux symptoms --We will order breathing test (full pulmonary function test) before you next visit in 3 months.  Follow-up in 3 months  Vaccination Declined pneumonia and influenza vaccine   Thank you for choosing  for your health needs!    Mechele Collin, MD [name] 08/30/2018 12:06 PM  Personal pager: [phone] If unanswered, please page CCM On-call: [phone]    Current Outpatient Medications:  .  albuterol (PROVENTIL HFA;VENTOLIN HFA) 108 (90 Base) MCG/ACT inhaler, Inhale 2 puffs into the lungs every 6 (six) hours as needed for wheezing or shortness of breath., Disp: 1 Inhaler, Rfl: 0 .  atorvastatin (LIPITOR) 20 MG tablet, Take 1 tablet (20 mg total) by mouth daily., Disp: 90 tablet, Rfl: 3 .  cholecalciferol (VITAMIN D) 1000 units tablet, Take 4,000 Units by mouth daily. , Disp: ,  Rfl:  .  LORazepam (ATIVAN) 2 MG tablet, Take 1 tablet (2 mg total) by mouth at bedtime., Disp: 90 tablet, Rfl: 1 .  oxyCODONE-acetaminophen (PERCOCET) 10-325 MG tablet, Take 1 tablet by mouth every 6 (six) hours as needed for pain., Disp: 120 tablet, Rfl: 0 .  [START ON 10/20/2018] oxyCODONE-acetaminophen (PERCOCET) 10-325 MG tablet, Take 1 tablet by mouth every 8 (eight) hours as needed for pain., Disp: 120 tablet, Rfl: 0 .  budesonide-formoterol (SYMBICORT) 160-4.5  MCG/ACT inhaler, Inhale 2 puffs into the lungs 2 (two) times daily. (Patient not taking: Reported on 08/30/2018), Disp: 1 Inhaler, Rfl: 3 .  triamcinolone cream (KENALOG) 0.1 %, Apply 1 application topically 2 (two) times daily. For 7-10 days maximum (Patient not taking: Reported on 08/30/2018), Disp: 80 g, Rfl: 0  Current Facility-Administered Medications:  .  ipratropium-albuterol (DUONEB) 0.5-2.5 (3) MG/3ML nebulizer solution 3 mL, 3 mL, Nebulization, Q6H, Wallace, Erica, DO, 3 mL at 07/23/18 1604

## 2018-08-31 DIAGNOSIS — M47816 Spondylosis without myelopathy or radiculopathy, lumbar region: Secondary | ICD-10-CM | POA: Diagnosis not present

## 2018-08-31 DIAGNOSIS — M5416 Radiculopathy, lumbar region: Secondary | ICD-10-CM | POA: Diagnosis not present

## 2018-08-31 DIAGNOSIS — M5136 Other intervertebral disc degeneration, lumbar region: Secondary | ICD-10-CM | POA: Diagnosis not present

## 2018-09-03 ENCOUNTER — Other Ambulatory Visit (INDEPENDENT_AMBULATORY_CARE_PROVIDER_SITE_OTHER): Payer: Medicare Other

## 2018-09-03 ENCOUNTER — Other Ambulatory Visit: Payer: Medicare Other

## 2018-09-03 DIAGNOSIS — E538 Deficiency of other specified B group vitamins: Secondary | ICD-10-CM | POA: Diagnosis not present

## 2018-09-03 DIAGNOSIS — R5383 Other fatigue: Secondary | ICD-10-CM

## 2018-09-03 DIAGNOSIS — E782 Mixed hyperlipidemia: Secondary | ICD-10-CM

## 2018-09-03 LAB — COMPREHENSIVE METABOLIC PANEL
ALT: 26 U/L (ref 0–35)
AST: 22 U/L (ref 0–37)
Albumin: 4.5 g/dL (ref 3.5–5.2)
Alkaline Phosphatase: 81 U/L (ref 39–117)
BUN: 18 mg/dL (ref 6–23)
CO2: 29 mEq/L (ref 19–32)
Calcium: 9.7 mg/dL (ref 8.4–10.5)
Chloride: 104 mEq/L (ref 96–112)
Creatinine, Ser: 0.6 mg/dL (ref 0.40–1.20)
GFR: 105.77 mL/min (ref >60.00)
Glucose, Bld: 93 mg/dL (ref 70–99)
Potassium: 4.8 mEq/L (ref 3.5–5.1)
Sodium: 141 mEq/L (ref 135–145)
Total Bilirubin: 0.4 mg/dL (ref 0.2–1.2)
Total Protein: 6.9 g/dL (ref 6.0–8.3)

## 2018-09-03 LAB — CBC WITH DIFFERENTIAL/PLATELET
Basophils Absolute: 0.1 10*3/uL (ref 0.0–0.1)
Basophils Relative: 0.8 % (ref 0.0–3.0)
Eosinophils Absolute: 0.3 10*3/uL (ref 0.0–0.7)
Eosinophils Relative: 3.9 % (ref 0.0–5.0)
HCT: 43.1 % (ref 36.0–46.0)
Hemoglobin: 14.6 g/dL (ref 12.0–15.0)
Lymphocytes Relative: 38.7 % (ref 12.0–46.0)
Lymphs Abs: 2.9 10*3/uL (ref 0.7–4.0)
MCHC: 33.7 g/dL (ref 30.0–36.0)
MCV: 96.1 fl (ref 78.0–100.0)
Monocytes Absolute: 0.5 10*3/uL (ref 0.1–1.0)
Monocytes Relative: 6.9 % (ref 3.0–12.0)
Neutro Abs: 3.8 10*3/uL (ref 1.4–7.7)
Neutrophils Relative %: 49.7 % (ref 43.0–77.0)
Platelets: 366 10*3/uL (ref 150.0–400.0)
RBC: 4.49 Mil/uL (ref 3.87–5.11)
RDW: 13.4 % (ref 11.5–15.5)
WBC: 7.6 10*3/uL (ref 4.0–10.5)

## 2018-09-03 LAB — VITAMIN B12: Vitamin B-12: 507 pg/mL (ref 211–911)

## 2018-09-03 LAB — LIPID PANEL
Cholesterol: 202 mg/dL — ABNORMAL HIGH (ref 0–200)
HDL: 86.2 mg/dL (ref >39.00)
LDL Cholesterol: 96 mg/dL (ref 0–99)
NonHDL: 115.73
Total CHOL/HDL Ratio: 2
Triglycerides: 98 mg/dL (ref 0.0–149.0)
VLDL: 19.6 mg/dL (ref 0.0–40.0)

## 2018-09-03 LAB — TSH: TSH: 1.2 u[IU]/mL (ref 0.35–4.50)

## 2018-09-09 NOTE — Progress Notes (Signed)
Subjective:   Patricia Underwood is a 67 y.o. female who presents for Medicare Annual (Subsequent) preventive examination.  Review of Systems  Constitutional: Positive for malaise/fatigue. Negative for chills, fever and weight loss.  Respiratory: Negative for cough, shortness of breath and wheezing.   Cardiovascular: Negative for chest pain, palpitations and leg swelling.  Gastrointestinal: Negative for abdominal pain, constipation, diarrhea, nausea and vomiting.  Genitourinary: Negative for dysuria and urgency.  Musculoskeletal: Positive for back pain and myalgias. Negative for falls and joint pain.  Skin: Negative for rash.  Neurological: Negative for dizziness and headaches.  Psychiatric/Behavioral: Negative for depression, substance abuse and suicidal ideas. The patient is not nervous/anxious.    Objective:   Vitals: BP 140/90   Pulse (!) 103   Temp 98.2 F (36.8 C) (Oral)   Resp 16   Ht 5' 4.5" (1.638 m)   Wt 114 lb (51.7 kg)   SpO2 98%   BMI 19.27 kg/m   Body mass index is 19.27 kg/m.  Physical Exam  Constitutional: She appears well-nourished.  HENT:  Head: Normocephalic and atraumatic.  Eyes: Pupils are equal, round, and reactive to light. EOM are normal.  Neck: Normal range of motion. Neck supple.  Cardiovascular: Normal rate, regular rhythm, normal heart sounds and intact distal pulses.  Pulmonary/Chest: Effort normal.  Abdominal: Soft.  Skin: Skin is warm.  Psychiatric: She has a normal mood and affect. Her behavior is normal.  Nursing note and vitals reviewed.  Tobacco Social History   Tobacco Use  Smoking Status Former Smoker  Smokeless Tobacco Never Used     Past Medical History:  Diagnosis Date  . Chronic back pain   . Chronic right shoulder pain 05/30/2017  . Fibromyalgia   . HLD (hyperlipidemia) 05/30/2017   Past Surgical History:  Procedure Laterality Date  . LUMBAR FUSION     L4-L5   Family History  Family history unknown: Yes   Social  History   Socioeconomic History  . Marital status: Married    Spouse name: Not on file  . Number of children: Not on file  . Years of education: Not on file  . Highest education level: Not on file  Occupational History  . Occupation: Retired   Engineer, production  . Financial resource strain: Not very hard  . Food insecurity:    Worry: Never true    Inability: Never true  . Transportation needs:    Medical: No    Non-medical: No  Tobacco Use  . Smoking status: Former Games developer  . Smokeless tobacco: Never Used  Substance and Sexual Activity  . Alcohol use: Yes    Alcohol/week: 7.0 standard drinks    Types: 7 Glasses of wine per week  . Drug use: No  . Sexual activity: Not Currently    Partners: Male    Birth control/protection: None  Lifestyle  . Physical activity:    Days per week: 2 days    Minutes per session: 20 min  . Stress: To some extent  Relationships  . Social connections:    Talks on phone: Not on file    Gets together: Twice a week    Attends religious service: Not on file    Active member of club or organization: Not on file    Attends meetings of clubs or organizations: Not on file    Relationship status: Married  Other Topics Concern  . Not on file  Social History Narrative  . Not on file  Current Outpatient Medications:  .  albuterol (PROVENTIL HFA;VENTOLIN HFA) 108 (90 Base) MCG/ACT inhaler, Inhale 2 puffs into the lungs every 6 (six) hours as needed for wheezing or shortness of breath., Disp: 1 Inhaler, Rfl: 0 .  atorvastatin (LIPITOR) 20 MG tablet, Take 1 tablet (20 mg total) by mouth daily., Disp: 90 tablet, Rfl: 3 .  cholecalciferol (VITAMIN D) 1000 units tablet, Take 4,000 Units by mouth daily. , Disp: , Rfl:  .  LORazepam (ATIVAN) 2 MG tablet, Take 1 tablet (2 mg total) by mouth at bedtime., Disp: 90 tablet, Rfl: 1 .  omeprazole (PRILOSEC) 40 MG capsule, Take 1 capsule (40 mg total) by mouth daily., Disp: 30 capsule, Rfl: 3 .   oxyCODONE-acetaminophen (PERCOCET) 10-325 MG tablet, Take 1 tablet by mouth every 4 (four) hours as needed for pain., Disp: 180 tablet, Rfl: 0  Activities of Daily Living In your present state of health, do you have any difficulty performing the following activities: 09/11/2018  Hearing? Y  Vision? N  Difficulty concentrating or making decisions? N  Walking or climbing stairs? N  Dressing or bathing? N  Doing errands, shopping? N  Preparing Food and eating ? N  Using the Toilet? N  In the past six months, have you accidently leaked urine? N  Do you have problems with loss of bowel control? N  Managing your Medications? N  Managing your Finances? N  Housekeeping or managing your Housekeeping? N  Some recent data might be hidden   Patient Care Team: Helane Rima, DO as PCP - General (Family Medicine) Jodelle Red, MD as PCP - Cardiology (Cardiology)    Assessment:   This is a routine wellness examination for Patricia Underwood.  Exercise Activities and Dietary recommendations Current Exercise Habits: The patient does not participate in regular exercise at present, Exercise limited by: orthopedic condition(s)  Fall Risk Fall Risk  08/28/2017 05/26/2017  Falls in the past year? No No   Is the patient's home free of loose throw rugs in walkways, pet beds, electrical cords, etc? yes. Grab bars in the bathroom? no. Handrails on the stairs? yes. Adequate lighting? yes.  Timed Get Up and Go performed: yes, < 10 seconds.  Depression Screen PHQ 2/9 Scores 08/28/2017 05/26/2017  PHQ - 2 Score 0 0    Cognitive Function MMSE - Mini Mental State Exam 09/11/2018  Orientation to time 5  Orientation to Place 5  Registration 3  Attention/ Calculation 5  Recall 3  Language- name 2 objects 2  Language- repeat 1  Language- follow 3 step command 3  Language- read & follow direction 1  Write a sentence 1  Copy design 1  Total score 30   Immunization History  Administered Date(s)  Administered  . Tdap 05/31/2013   Qualifies for Shingles Vaccine? Yes.  Screening Tests Health Maintenance  Topic Date Due  . MAMMOGRAM  01/02/2001  . DEXA SCAN  01/03/2016  . TETANUS/TDAP  06/01/2023   Cancer Screenings: Reviewed.    Plan:   I have personally reviewed and noted the following in the patient's chart:   . Medical and social history . Use of alcohol, tobacco or illicit drugs  . Current medications and supplements . Functional ability and status . Nutritional status . Physical activity . Advanced directives . List of other physicians . Hospitalizations, surgeries, and ER visits in previous 12 months . Vitals . Screenings to include cognitive, depression, and falls . Referrals and appointments  In addition, I have reviewed and discussed  with patient certain preventive protocols, quality metrics, and best practice recommendations. A written personalized care plan for preventive services as well as general preventive health recommendations were provided to patient.  Helane Rima, DO  09/11/2018

## 2018-09-10 ENCOUNTER — Ambulatory Visit (INDEPENDENT_AMBULATORY_CARE_PROVIDER_SITE_OTHER): Payer: Medicare Other | Admitting: Family Medicine

## 2018-09-10 ENCOUNTER — Encounter: Payer: Self-pay | Admitting: Family Medicine

## 2018-09-10 VITALS — BP 140/90 | HR 103 | Temp 98.2°F | Resp 16 | Ht 64.5 in | Wt 114.0 lb

## 2018-09-10 DIAGNOSIS — M25511 Pain in right shoulder: Secondary | ICD-10-CM | POA: Diagnosis not present

## 2018-09-10 DIAGNOSIS — E538 Deficiency of other specified B group vitamins: Secondary | ICD-10-CM | POA: Diagnosis not present

## 2018-09-10 DIAGNOSIS — E782 Mixed hyperlipidemia: Secondary | ICD-10-CM | POA: Diagnosis not present

## 2018-09-10 DIAGNOSIS — L281 Prurigo nodularis: Secondary | ICD-10-CM

## 2018-09-10 DIAGNOSIS — Z0000 Encounter for general adult medical examination without abnormal findings: Secondary | ICD-10-CM | POA: Diagnosis not present

## 2018-09-10 DIAGNOSIS — G8929 Other chronic pain: Secondary | ICD-10-CM

## 2018-09-10 DIAGNOSIS — M797 Fibromyalgia: Secondary | ICD-10-CM

## 2018-09-10 DIAGNOSIS — K219 Gastro-esophageal reflux disease without esophagitis: Secondary | ICD-10-CM | POA: Diagnosis not present

## 2018-09-10 DIAGNOSIS — S83241D Other tear of medial meniscus, current injury, right knee, subsequent encounter: Secondary | ICD-10-CM | POA: Diagnosis not present

## 2018-09-10 DIAGNOSIS — M549 Dorsalgia, unspecified: Secondary | ICD-10-CM | POA: Diagnosis not present

## 2018-09-10 MED ORDER — OXYCODONE-ACETAMINOPHEN 10-325 MG PO TABS: 1.0000 | ORAL_TABLET | ORAL | 0 refills | Status: DC | PRN

## 2018-09-10 MED ORDER — OMEPRAZOLE 40 MG PO CPDR: 40.0000 mg | DELAYED_RELEASE_CAPSULE | Freq: Every day | ORAL | 3 refills | Status: DC

## 2018-09-10 NOTE — Patient Instructions (Signed)
I will order PT to be done at our location and will look into a few options for back specialists here.

## 2018-09-11 ENCOUNTER — Encounter: Payer: Self-pay | Admitting: Family Medicine

## 2018-09-11 DIAGNOSIS — G8929 Other chronic pain: Secondary | ICD-10-CM | POA: Insufficient documentation

## 2018-09-11 DIAGNOSIS — K219 Gastro-esophageal reflux disease without esophagitis: Secondary | ICD-10-CM | POA: Insufficient documentation

## 2018-09-11 DIAGNOSIS — M549 Dorsalgia, unspecified: Secondary | ICD-10-CM

## 2018-09-11 DIAGNOSIS — L281 Prurigo nodularis: Secondary | ICD-10-CM | POA: Insufficient documentation

## 2018-09-11 DIAGNOSIS — M797 Fibromyalgia: Secondary | ICD-10-CM | POA: Insufficient documentation

## 2018-09-21 ENCOUNTER — Ambulatory Visit: Payer: Medicare Other | Admitting: Family Medicine

## 2018-09-28 NOTE — Progress Notes (Signed)
Patricia Underwood is a 67 y.o. female is here for follow up.  History of Present Illness:   HPI:  See Assessment and Plan section for Problem Based Charting of issues discussed today.   Health Maintenance Due  Topic Date Due  . MAMMOGRAM  01/02/2001  . DEXA SCAN  01/03/2016   Depression screen PHQ 2/9 09/11/2018 08/28/2017 05/26/2017  Decreased Interest 0 0 0  Down, Depressed, Hopeless 0 0 0  PHQ - 2 Score 0 0 0   PMHx, SurgHx, SocialHx, FamHx, Medications, and Allergies were reviewed in the Visit Navigator and updated as appropriate.   Patient Active Problem List   Diagnosis Date Noted  . 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 Fibromyalgia 09/11/2018  . Chronic back pain 09/11/2018  . Gastroesophageal reflux disease without esophagitis 09/11/2018  . 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 Opioid contract exists 04/19/2018  . Acute medial meniscal tear 11/26/2017  . Trigger finger 08/17/2017  . Low vitamin D level 07/05/2017  . B12 deficiency 07/05/2017  . Insomnia 07/05/2017  . Chronic right shoulder pain 05/30/2017  . HLD (hyperlipidemia) 05/30/2017   Social History   Tobacco Use  . Smoking status: Former Games developer  . Smokeless tobacco: Never Used  Substance Use Topics  . Alcohol use: Yes    Alcohol/week: 7.0 standard drinks    Types: 7 Glasses of wine per week  . Drug use: No   Current Medications and Allergies:   .  albuterol (PROVENTIL HFA;VENTOLIN HFA) 108 (90 Base) MCG/ACT inhaler, Inhale 2 puffs into the lungs every 6 (six) hours as needed for wheezing or shortness of breath., Disp: 1 Inhaler, Rfl: 0 .  atorvastatin (LIPITOR) 20 MG tablet, Take 1 tablet (20 mg total) by mouth daily., Disp: 90 tablet, Rfl: 3 .  cholecalciferol (VITAMIN D) 1000 units tablet, Take 4,000 Units by mouth daily. , Disp: , Rfl:  .  LORazepam (ATIVAN) 2 MG tablet, Take 1 tablet (2 mg total) by mouth at bedtime., Disp: 90 tablet, Rfl: 1 .  omeprazole (PRILOSEC) 40 MG capsule,  Take 1 capsule (40 mg total) by mouth daily., Disp: 30 capsule, Rfl: 3 .  oxyCODONE-acetaminophen (PERCOCET) 10-325 MG tablet, Take 1 tablet by mouth every 4 (four) hours as needed for pain., Disp: 180 tablet, Rfl: 0   Allergies  Allergen Reactions  . Ambien [Zolpidem Tartrate]   . Flexeril [Cyclobenzaprine] Other (See Comments)    Patient unsure of reaction  . Naproxen Other (See Comments)    Patient unsure of reaction.   Review of Systems   Pertinent items are noted in the HPI. Otherwise, ROS is negative.  Vitals:   Vitals:   09/29/18 1259  BP: 130/80  Pulse: 79  Temp: 98.6 F (37 C)  TempSrc: Oral  SpO2: 97%  Weight: 117 lb (53.1 kg)  Height: 5' 4.5" (1.638 m)     Body mass index is 19.77 kg/m.  Physical Exam:   Physical Exam  Constitutional: She appears well-nourished.  HENT:  Head: Normocephalic and atraumatic.  Eyes: Pupils are equal, round, and reactive to light. EOM are normal.  Neck: Normal range of motion. Neck supple.  Cardiovascular: Normal rate, regular rhythm, normal heart sounds and intact distal pulses.  Pulmonary/Chest: Effort normal.  Abdominal: Soft.  Skin: Skin is warm.  Psychiatric: She has a normal mood and affect. Her behavior is normal.  Nursing note and vitals reviewed.  Results for orders placed or performed in visit on 09/03/18  CBC with  Differential/Platelet  Result Value Ref Range   WBC 7.6 4.0 - 10.5 K/uL   RBC 4.49 3.87 - 5.11 Mil/uL   Hemoglobin 14.6 12.0 - 15.0 g/dL   HCT 84.6 96.2 - 95.2 %   MCV 96.1 78.0 - 100.0 fl   MCHC 33.7 30.0 - 36.0 g/dL   RDW 84.1 32.4 - 40.1 %   Platelets 366.0 150.0 - 400.0 K/uL   Neutrophils Relative % 49.7 43.0 - 77.0 %   Lymphocytes Relative 38.7 12.0 - 46.0 %   Monocytes Relative 6.9 3.0 - 12.0 %   Eosinophils Relative 3.9 0.0 - 5.0 %   Basophils Relative 0.8 0.0 - 3.0 %   Neutro Abs 3.8 1.4 - 7.7 K/uL   Lymphs Abs 2.9 0.7 - 4.0 K/uL   Monocytes Absolute 0.5 0.1 - 1.0 K/uL   Eosinophils  Absolute 0.3 0.0 - 0.7 K/uL   Basophils Absolute 0.1 0.0 - 0.1 K/uL  Comprehensive metabolic panel  Result Value Ref Range   Sodium 141 135 - 145 mEq/L   Potassium 4.8 3.5 - 5.1 mEq/L   Chloride 104 96 - 112 mEq/L   CO2 29 19 - 32 mEq/L   Glucose, Bld 93 70 - 99 mg/dL   BUN 18 6 - 23 mg/dL   Creatinine, Ser 0.27 0.40 - 1.20 mg/dL   Total Bilirubin 0.4 0.2 - 1.2 mg/dL   Alkaline Phosphatase 81 39 - 117 U/L   AST 22 0 - 37 U/L   ALT 26 0 - 35 U/L   Total Protein 6.9 6.0 - 8.3 g/dL   Albumin 4.5 3.5 - 5.2 g/dL   Calcium 9.7 8.4 - 25.3 mg/dL   GFR 664.40 >34.74 mL/min  Lipid panel  Result Value Ref Range   Cholesterol 202 (H) 0 - 200 mg/dL   Triglycerides 25.9 0.0 - 149.0 mg/dL   HDL 56.38 >75.64 mg/dL   VLDL 33.2 0.0 - 95.1 mg/dL   LDL Cholesterol 96 0 - 99 mg/dL   Total CHOL/HDL Ratio 2    NonHDL 115.73   Vitamin B12  Result Value Ref Range   Vitamin B-12 507 211 - 911 pg/mL  TSH  Result Value Ref Range   TSH 1.20 0.35 - 4.50 uIU/mL   Assessment and Plan:   1. Chronic back pain Contract exists. Patient well known to me. She will be traveling to see her sister, hoping to stay for a few weeks. Would like to leave this week, but prescriptions not due for refill until next week. Okay new Rx today. Database reviewed.   2. B12 deficiency Injection in office today.  3. Situational mixed anxiety and depressive disorder Difficult marriage. She has been with her husband > 40 years but they no longer speak. He controls money. She is lonely. Family up [place], which is why she will be traveling to see her sister. She denies SI/HI. She would like to see a therapist.   Orders Placed This Encounter  Procedures  . Ambulatory referral to Physical Therapy   Meds ordered this encounter  Medications  . cyanocobalamin ((VITAMIN B-12)) injection 1,000 mcg  . oxyCODONE-acetaminophen (PERCOCET) 10-325 MG tablet    Sig: 1-2 tab every 4 hours    Dispense:  150 tablet    Refill:  0    . Reviewed expectations re: course of current medical issues. . Discussed self-management of symptoms. . Outlined signs and symptoms indicating need for more acute intervention. . Patient verbalized understanding and all questions were answered. [name]  Health Maintenance issues including appropriate healthy diet, exercise, and smoking avoidance were discussed with patient. . See orders for this visit as documented in the electronic medical record. . Patient received an After Visit Summary.  CMA served as Neurosurgeon during this visit. History, Physical, and Plan performed by medical provider. The above documentation has been reviewed and is accurate and complete. Helane Rima, D.O.  Helane Rima, DO [place] 09/30/2018

## 2018-09-29 ENCOUNTER — Ambulatory Visit (INDEPENDENT_AMBULATORY_CARE_PROVIDER_SITE_OTHER): Payer: Medicare Other | Admitting: Family Medicine

## 2018-09-29 ENCOUNTER — Ambulatory Visit: Payer: Medicare Other | Admitting: Family Medicine

## 2018-09-29 VITALS — BP 130/80 | HR 79 | Temp 98.6°F | Ht 64.5 in | Wt 117.0 lb

## 2018-09-29 DIAGNOSIS — G8929 Other chronic pain: Secondary | ICD-10-CM

## 2018-09-29 DIAGNOSIS — M549 Dorsalgia, unspecified: Secondary | ICD-10-CM

## 2018-09-29 DIAGNOSIS — F4323 Adjustment disorder with mixed anxiety and depressed mood: Secondary | ICD-10-CM | POA: Diagnosis not present

## 2018-09-29 DIAGNOSIS — I2584 Coronary atherosclerosis due to calcified coronary lesion: Secondary | ICD-10-CM

## 2018-09-29 DIAGNOSIS — E538 Deficiency of other specified B group vitamins: Secondary | ICD-10-CM | POA: Diagnosis not present

## 2018-09-29 DIAGNOSIS — I2510 Atherosclerotic heart disease of native coronary artery without angina pectoris: Secondary | ICD-10-CM

## 2018-09-29 MED ORDER — OXYCODONE-ACETAMINOPHEN 10-325 MG PO TABS: ORAL_TABLET | ORAL | 0 refills | Status: AC

## 2018-09-29 MED ORDER — CYANOCOBALAMIN 1000 MCG/ML IJ SOLN
1000.0000 ug | Freq: Once | INTRAMUSCULAR | Status: AC
  Administered 2018-09-29: 1000 ug via INTRAMUSCULAR

## 2018-09-29 NOTE — Patient Instructions (Addendum)
You have an appointment scheduled for: []   2D Mammogram  [x]   3D Mammogram  [x]   Bone Density   Call below for appointment    Your appointment will at the following location  [x]   [facility]      [address] [place],                []   [facility]  [address] [place], [phone]    Make sure to wear two peace clothing  No lotions powders or deodorants the day of the appointment Make sure to bring picture ID and insurance card.  Bring list of medications you are currently taking including any supplements.      Phone: [phone]

## 2018-09-30 ENCOUNTER — Other Ambulatory Visit: Payer: Self-pay | Admitting: Family Medicine

## 2018-09-30 ENCOUNTER — Encounter: Payer: Self-pay | Admitting: Family Medicine

## 2018-09-30 DIAGNOSIS — F4323 Adjustment disorder with mixed anxiety and depressed mood: Secondary | ICD-10-CM | POA: Insufficient documentation

## 2018-09-30 DIAGNOSIS — Z1231 Encounter for screening mammogram for malignant neoplasm of breast: Secondary | ICD-10-CM

## 2018-10-06 ENCOUNTER — Telehealth: Payer: Self-pay | Admitting: Family Medicine

## 2018-10-06 ENCOUNTER — Other Ambulatory Visit: Payer: Self-pay

## 2018-10-06 DIAGNOSIS — E2839 Other primary ovarian failure: Secondary | ICD-10-CM

## 2018-10-06 NOTE — Telephone Encounter (Signed)
Has order in chart.

## 2018-10-06 NOTE — Telephone Encounter (Signed)
Caller is scheduled for a bone density test in [place]. She may need a referral for the test.   PER [facility]

## 2018-10-12 ENCOUNTER — Ambulatory Visit: Payer: Medicare Other | Admitting: Cardiology

## 2018-10-14 ENCOUNTER — Ambulatory Visit: Payer: Medicare Other | Admitting: Physical Therapy

## 2018-10-15 DIAGNOSIS — M8589 Other specified disorders of bone density and structure, multiple sites: Secondary | ICD-10-CM | POA: Diagnosis not present

## 2018-10-15 DIAGNOSIS — Z8542 Personal history of malignant neoplasm of other parts of uterus: Secondary | ICD-10-CM | POA: Diagnosis not present

## 2018-10-15 DIAGNOSIS — Z90710 Acquired absence of both cervix and uterus: Secondary | ICD-10-CM | POA: Diagnosis not present

## 2018-10-15 DIAGNOSIS — Z780 Asymptomatic menopausal state: Secondary | ICD-10-CM | POA: Diagnosis not present

## 2018-10-15 DIAGNOSIS — Z1231 Encounter for screening mammogram for malignant neoplasm of breast: Secondary | ICD-10-CM | POA: Diagnosis not present

## 2018-10-15 DIAGNOSIS — Z981 Arthrodesis status: Secondary | ICD-10-CM | POA: Diagnosis not present

## 2018-10-15 LAB — HM DEXA SCAN

## 2018-10-25 ENCOUNTER — Encounter: Payer: Self-pay | Admitting: Family Medicine

## 2018-10-25 DIAGNOSIS — M810 Age-related osteoporosis without current pathological fracture: Secondary | ICD-10-CM | POA: Insufficient documentation

## 2018-10-25 DIAGNOSIS — M8580 Other specified disorders of bone density and structure, unspecified site: Secondary | ICD-10-CM

## 2018-10-25 NOTE — Assessment & Plan Note (Signed)
>>  ASSESSMENT AND PLAN FOR OSTEOPENIA WRITTEN ON 10/25/2018 11:32 AM BY Cosimo Diones, CMA  Followed by [facility]

## 2018-10-25 NOTE — Assessment & Plan Note (Signed)
Followed by [facility]

## 2018-11-05 DIAGNOSIS — N6489 Other specified disorders of breast: Secondary | ICD-10-CM | POA: Diagnosis not present

## 2018-11-05 DIAGNOSIS — R922 Inconclusive mammogram: Secondary | ICD-10-CM | POA: Diagnosis not present

## 2018-11-05 LAB — HM MAMMOGRAPHY

## 2018-11-09 ENCOUNTER — Ambulatory Visit (INDEPENDENT_AMBULATORY_CARE_PROVIDER_SITE_OTHER): Payer: Medicare Other | Admitting: Family Medicine

## 2018-11-09 ENCOUNTER — Encounter: Payer: Self-pay | Admitting: Family Medicine

## 2018-11-09 VITALS — BP 120/80 | HR 104 | Temp 98.6°F | Ht 65.0 in | Wt 117.4 lb

## 2018-11-09 DIAGNOSIS — E538 Deficiency of other specified B group vitamins: Secondary | ICD-10-CM

## 2018-11-09 DIAGNOSIS — G894 Chronic pain syndrome: Secondary | ICD-10-CM | POA: Diagnosis not present

## 2018-11-09 DIAGNOSIS — M8580 Other specified disorders of bone density and structure, unspecified site: Secondary | ICD-10-CM

## 2018-11-09 DIAGNOSIS — R51 Headache: Secondary | ICD-10-CM

## 2018-11-09 DIAGNOSIS — G4486 Cervicogenic headache: Secondary | ICD-10-CM

## 2018-11-09 DIAGNOSIS — R413 Other amnesia: Secondary | ICD-10-CM | POA: Diagnosis not present

## 2018-11-09 DIAGNOSIS — K219 Gastro-esophageal reflux disease without esophagitis: Secondary | ICD-10-CM | POA: Diagnosis not present

## 2018-11-09 DIAGNOSIS — I2510 Atherosclerotic heart disease of native coronary artery without angina pectoris: Secondary | ICD-10-CM

## 2018-11-09 DIAGNOSIS — I2584 Coronary atherosclerosis due to calcified coronary lesion: Secondary | ICD-10-CM | POA: Diagnosis not present

## 2018-11-09 DIAGNOSIS — F5102 Adjustment insomnia: Secondary | ICD-10-CM

## 2018-11-09 DIAGNOSIS — F4323 Adjustment disorder with mixed anxiety and depressed mood: Secondary | ICD-10-CM

## 2018-11-09 MED ORDER — OXYCODONE-ACETAMINOPHEN 10-325 MG PO TABS: 1.0000 | ORAL_TABLET | ORAL | 0 refills | Status: DC | PRN

## 2018-11-09 MED ORDER — OMEPRAZOLE 40 MG PO CPDR: 40.0000 mg | DELAYED_RELEASE_CAPSULE | Freq: Every day | ORAL | 3 refills | Status: DC

## 2018-11-09 MED ORDER — CYANOCOBALAMIN 1000 MCG/ML IJ SOLN
1000.0000 ug | Freq: Once | INTRAMUSCULAR | Status: AC
  Administered 2018-11-09: 1000 ug via INTRAMUSCULAR

## 2018-11-09 MED ORDER — PREDNISONE 5 MG PO TABS: ORAL_TABLET | ORAL | 0 refills | Status: DC

## 2018-11-09 MED ORDER — LORAZEPAM 2 MG PO TABS: 2.0000 mg | ORAL_TABLET | Freq: Every day | ORAL | 1 refills | Status: DC

## 2018-11-09 NOTE — Patient Instructions (Signed)
If you have not heard from the [facility] in the next 2 weeks, please call and ask to speak with Jerilynn Birkenhead - our referral coordinator.

## 2018-11-09 NOTE — Progress Notes (Signed)
Patricia Underwood is a 67 y.o. female here for an acute visit.  History of Present Illness:   Patricia Underwood, CMA acting as scribe for Dr. Helane Rima.   HPI: Patient in office for evaluation of dull head ache for 4 days. Patient has dull pain in back of head. It bothers her to point that she has to lay down. She has not taken anything over the counter for pain. No sensitivity to light, some nausea this morning but no vomiting. She does feel like she has a "cold" today. She could not give any symptoms just "does not feel terrific today".   Dexa Scan: She would like to review results from Dexa scan. She states that she was given results from [facility]. We do not have that in office but I will call for that results.  Update: reviewed with patient. Osteopenia.   Patient notes some memory loss, short term. Worsening over the past year or so.  PMHx, SurgHx, SocialHx, Medications, and Allergies were reviewed in the Visit Navigator and updated as appropriate.  Current Medications:    .  albuterol (PROVENTIL HFA;VENTOLIN HFA) 108 (90 Base) MCG/ACT inhaler, Inhale 2 puffs into the lungs every 6 (six) hours as needed for wheezing or shortness of breath., Disp: 1 Inhaler, Rfl: 0 .  atorvastatin (LIPITOR) 20 MG tablet, Take 1 tablet (20 mg total) by mouth daily., Disp: 90 tablet, Rfl: 3 .  cholecalciferol (VITAMIN D) 1000 units tablet, Take 4,000 Units by mouth daily. , Disp: , Rfl:  .  LORazepam (ATIVAN) 2 MG tablet, Take 1 tablet (2 mg total) by mouth at bedtime., Disp: 90 tablet, Rfl: 1 .  omeprazole (PRILOSEC) 40 MG capsule, Take 1 capsule (40 mg total) by mouth daily., Disp: 30 capsule, Rfl: 3   Allergies  Allergen Reactions  . Ambien [Zolpidem Tartrate]   . Flexeril [Cyclobenzaprine] Other (See Comments)    Patient unsure of reaction  . Naproxen Other (See Comments)    Patient unsure of reaction.   Review of Systems:   Pertinent items are noted in the HPI. Otherwise, ROS is  negative.  Vitals:   Vitals:   11/09/18 0752  BP: 120/80  Pulse: (!) 104  Temp: 98.6 F (37 C)  TempSrc: Oral  SpO2: 96%  Weight: 117 lb 6.4 oz (53.3 kg)  Height: 5\' 5"  (1.651 m)     Body mass index is 19.54 kg/m.  Physical Exam:   Physical Exam Vitals signs and nursing note reviewed.  HENT:     Head: Normocephalic and atraumatic.  Eyes:     Pupils: Pupils are equal, round, and reactive to light.  Neck:     Musculoskeletal: Normal range of motion and neck supple.  Cardiovascular:     Rate and Rhythm: Normal rate and regular rhythm.     Heart sounds: Normal heart sounds.  Pulmonary:     Effort: Pulmonary effort is normal.  Abdominal:     Palpations: Abdomen is soft.  Skin:    General: Skin is warm.  Psychiatric:        Behavior: Behavior normal.    Assessment and Plan:   Patricia Underwood was seen today for headache.  Diagnoses and all orders for this visit:  Chronic pain syndrome Comments: Continue current treatment for now. Plan is to wean off over the next year. She wants to be off opioids in order to care for future grandchildren. Orders: -     predniSONE (DELTASONE) 5 MG tablet; 6-5-4-3-2-1-off -  oxyCODONE-acetaminophen (PERCOCET) 10-325 MG tablet; Take 1 tablet by mouth every 4 (four) hours as needed for pain. -     oxyCODONE-acetaminophen (PERCOCET) 10-325 MG tablet; Take 1 tablet by mouth every 4 (four) hours as needed for pain. -     oxyCODONE-acetaminophen (PERCOCET) 10-325 MG tablet; Take 1 tablet by mouth every 4 (four) hours as needed for pain.  Adjustment insomnia -     LORazepam (ATIVAN) 2 MG tablet; Take 1 tablet (2 mg total) by mouth at bedtime.  B12 deficiency -     cyanocobalamin ((VITAMIN B-12)) injection 1,000 mcg  Memory loss Comments: New. Patient would like thorough workup. Orders: -     Ambulatory referral to Neuropsychology  Osteopenia, unspecified location  Gastroesophageal reflux disease without esophagitis -     omeprazole  (PRILOSEC) 40 MG capsule; Take 1 capsule (40 mg total) by mouth daily.  Situational mixed anxiety and depressive disorder -     LORazepam (ATIVAN) 2 MG tablet; Take 1 tablet (2 mg total) by mouth at bedtime.  Cervicogenic headache Comments: Improving. Discussed symptomatic care and red flags.     . Reviewed expectations re: course of current medical issues. . Discussed self-management of symptoms. . Outlined signs and symptoms indicating need for more acute intervention. . Patient verbalized understanding and all questions were answered. [name] Health Maintenance issues including appropriate healthy diet, exercise, and smoking avoidance were discussed with patient. . See orders for this visit as documented in the electronic medical record. . Patient received an After Visit Summary.  CMA served as Neurosurgeon during this visit. History, Physical, and Plan performed by medical provider. The above documentation has been reviewed and is accurate and complete. Helane Rima, D.O.  Helane Rima, DO , [facility] 11/11/2018

## 2018-11-16 ENCOUNTER — Encounter: Payer: Self-pay | Admitting: Physician Assistant

## 2018-11-16 ENCOUNTER — Ambulatory Visit: Payer: Medicare Other | Admitting: Family Medicine

## 2018-11-16 ENCOUNTER — Encounter

## 2018-11-18 ENCOUNTER — Ambulatory Visit (INDEPENDENT_AMBULATORY_CARE_PROVIDER_SITE_OTHER): Payer: Medicare Other | Admitting: Physical Therapy

## 2018-11-18 ENCOUNTER — Encounter: Payer: Self-pay | Admitting: Physical Therapy

## 2018-11-18 DIAGNOSIS — M545 Low back pain: Secondary | ICD-10-CM

## 2018-11-18 DIAGNOSIS — G8929 Other chronic pain: Secondary | ICD-10-CM | POA: Diagnosis not present

## 2018-11-22 ENCOUNTER — Ambulatory Visit (INDEPENDENT_AMBULATORY_CARE_PROVIDER_SITE_OTHER): Payer: Medicare Other | Admitting: Physical Therapy

## 2018-11-22 ENCOUNTER — Encounter: Payer: Self-pay | Admitting: Physical Therapy

## 2018-11-22 ENCOUNTER — Ambulatory Visit: Payer: Self-pay

## 2018-11-22 DIAGNOSIS — G8929 Other chronic pain: Secondary | ICD-10-CM | POA: Diagnosis not present

## 2018-11-22 DIAGNOSIS — M545 Low back pain: Secondary | ICD-10-CM

## 2018-11-22 NOTE — Therapy (Signed)
[facility] [address] [place], [place], [address] Phone: [phone]   Fax:  [phone]  Physical Therapy Treatment  Patient Details  Name: Patricia Underwood MRN: [id number] Date of Birth: 23-Nov-1950 Referring Provider (PT): Helane Rima   Encounter Date: 11/22/2018  PT End of Session - 11/22/18 1259    Visit Number  2    Number of Visits  12    Date for PT Re-Evaluation  12/30/18    Authorization Type  Medicare    PT Start Time  1106    PT Stop Time  1140    PT Time Calculation (min)  34 min    Activity Tolerance  Patient tolerated treatment well    Behavior During Therapy  [facility] for tasks assessed/performed       Past Medical History:  Diagnosis Date  . Chronic back pain   . Chronic right shoulder pain 05/30/2017  . Fibromyalgia   . HLD (hyperlipidemia) 05/30/2017    Past Surgical History:  Procedure Laterality Date  . LUMBAR FUSION     L4-L5    There were no vitals filed for this visit.  Subjective Assessment - 11/22/18 1258    Subjective  Pt with no new complaints.     Limitations  Sitting;Reading;Lifting;Standing;Walking;House hold activities    Currently in Pain?  Yes    Pain Score  5     Pain Location  Back    Pain Orientation  Right    Pain Descriptors / Indicators  Aching;Sore;Tightness    Pain Type  Chronic pain    Pain Onset  More than a month ago    Pain Frequency  Intermittent                       [facility] Adult PT Treatment/Exercise - 11/22/18 1302      Exercises   Exercises  Lumbar      Lumbar Exercises: Stretches   Active Hamstring Stretch  3 reps;30 seconds    Active Hamstring Stretch Limitations  seated    Single Knee to Chest Stretch  3 reps;30 seconds    Pelvic Tilt  20 reps      Lumbar Exercises: Aerobic   Stationary Bike  L1 x 6 min      Lumbar Exercises: Standing   Other Standing Lumbar Exercises  Hip Abd 2x10 bil; Standing march x20;       Lumbar Exercises: Supine   Ab Set  10 reps;5 seconds    Clam  20 reps    Clam Limitations  RTB    Bent Knee Raise  20 reps             PT Education - 11/22/18 1259    Education Details  Initial HEP    Person(s) Educated  Patient    Methods  Explanation;Verbal cues;Demonstration;Tactile cues;Handout    Comprehension  Verbalized understanding;Need further instruction       PT Short Term Goals - 11/22/18 1238      PT SHORT TERM GOAL #1   Title  Pt to be independent with initial HEP    Time  2    Period  Weeks    Status  New    Target Date  12/02/18      PT SHORT TERM GOAL #2   Title  Pt to report decreased pain with bending, to 0-3/10     Time  2    Period  Weeks    Status  New    Target Date  12/02/18        PT Long Term Goals - 11/22/18 1239      PT LONG TERM GOAL #1   Title  Pt to be independent with final HEP for back.    Time  6    Period  Weeks    Status  New    Target Date  12/30/18      PT LONG TERM GOAL #2   Title  Pt to demo increased strength of hips to be at least 4+/5, and for increased core strength, to see minimal postrual changes with stability exercises, to improve stability and pain.     Time  6    Period  Weeks    Status  New    Target Date  12/30/18      PT LONG TERM GOAL #3   Title  Pt to demo improved ability for bending, lifting, squatting, with mechanics to be WNL, and for pain to be 0-2/10, to improve ability for IADLs.     Time  6    Period  Weeks    Status  New    Target Date  12/30/18            Plan - 11/22/18 1300    Clinical Impression Statement  Pt requests to leave early today, at 11: 40. Pt educated on initial HEp today, handout given. Pt to benefit from education and progression of strengthening for HEP. Pt educated on proper seated posture, for improved pain. Plan to progress as tolerated.     Rehab Potential  Good    PT Frequency  2x / week    PT Duration  6 weeks    PT Treatment/Interventions  ADLs/Self Care Home  Management;Cryotherapy;Electrical Stimulation;Iontophoresis 4mg /ml Dexamethasone;Functional mobility training;Stair training;Gait training;Ultrasound;Traction;Moist Heat;Therapeutic activities;Therapeutic exercise;Balance training;Neuromuscular re-education;Patient/family education;Passive range of motion;Dry needling;Taping    Consulted and Agree with Plan of Care  Patient       Patient will benefit from skilled therapeutic intervention in order to improve the following deficits and impairments:  Abnormal gait, Decreased range of motion, Difficulty walking, Increased muscle spasms, Decreased endurance, Decreased activity tolerance, Pain, Improper body mechanics, Impaired flexibility, Hypomobility, Decreased balance, Decreased strength  Visit Diagnosis: Chronic bilateral low back pain without sciatica     Problem List Patient Active Problem List   Diagnosis Date Noted  . Osteopenia 10/25/2018  . Situational mixed anxiety and depressive disorder 09/30/2018  . 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 Fibromyalgia 09/11/2018  . Chronic back pain 09/11/2018  . Gastroesophageal reflux disease without esophagitis 09/11/2018  . 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 Opioid contract exists 04/19/2018  . Acute medial meniscal tear 11/26/2017  . Trigger finger 08/17/2017  . Low vitamin D level 07/05/2017  . B12 deficiency 07/05/2017  . Insomnia 07/05/2017  . Chronic right shoulder pain 05/30/2017  . HLD (hyperlipidemia) 05/30/2017    Sedalia Muta, PT, DPT 1:04 PM  11/22/18    [facility] [address] [place], [place], [address] Phone: [phone]   Fax:  [phone]  Name: Patricia Underwood MRN: [id number] Date of Birth: Aug 20, 1951

## 2018-11-22 NOTE — Therapy (Signed)
[facility] [address] [place], [place], [address] Phone: [phone]   Fax:  [phone]  Physical Therapy Evaluation  Patient Details  Name: Patricia Underwood MRN: [id number] Date of Birth: 08-Nov-1951 Referring Provider (PT): Helane Rima   Encounter Date: 11/18/2018  PT End of Session - 11/22/18 1235    Visit Number  1    Number of Visits  12    Date for PT Re-Evaluation  12/30/18    Authorization Type  Medicare    PT Start Time  1100    PT Stop Time  1142    PT Time Calculation (min)  42 min    Activity Tolerance  Patient tolerated treatment well    Behavior During Therapy  [facility] for tasks assessed/performed       Past Medical History:  Diagnosis Date  . Chronic back pain   . Chronic right shoulder pain 05/30/2017  . Fibromyalgia   . HLD (hyperlipidemia) 05/30/2017    Past Surgical History:  Procedure Laterality Date  . LUMBAR FUSION     L4-L5    There were no vitals filed for this visit.   Subjective Assessment - 11/22/18 1232    Subjective  Pt states long history of back pain, and multiple surgeries. She states most pain in low back, with riding in car, walking, bending .Denies numbness/tingling, but has had this before. Pt would like to get back into exercising, and increase her activity level, goal to return to gym.     Limitations  Sitting;Reading;Lifting;Standing;Walking;House hold activities    Currently in Pain?  Yes    Pain Score  5     Pain Location  Back    Pain Orientation  Right;Left    Pain Descriptors / Indicators  Aching;Sore;Tightness    Pain Type  Chronic pain    Pain Onset  More than a month ago    Pain Frequency  Intermittent    Aggravating Factors   bending, increased activity, standing, prolonged sitting    Pain Relieving Factors  resting, meds         [facility] PT Assessment - 11/22/18 0001      Assessment   Medical Diagnosis  Back Pain    Referring Provider (PT)  Helane Rima    Prior  Therapy  no      Balance Screen   Has the patient fallen in the past 6 months  No      Prior Function   Level of Independence  Independent      Cognition   Overall Cognitive Status  Within Functional Limits for tasks assessed      Posture/Postural Control   Posture Comments  Standing: mild R SB       AROM   Overall AROM Comments  Lumbar: flexion: minimal deficit; Exension: mod deficit/pain;  SB: mod deficit/ pain;  Hips: mod stiffness in R vs L for rotation and extension;        Palpation   Palpation comment  Tenderness in bil SI region, R glute, and R hip; Stiffness in R hip vs L;       Special Tests   Other special tests  Neg SLR;  + Pain with lumbar extension.                 Objective measurements completed on examination: See above findings.              PT Education - 11/22/18 1235    Education Details  PT POC    Person(s) Educated  Patient    Methods  Explanation    Comprehension  Verbalized understanding       PT Short Term Goals - 11/22/18 1238      PT SHORT TERM GOAL #1   Title  Pt to be independent with initial HEP    Time  2    Period  Weeks    Status  New    Target Date  12/02/18      PT SHORT TERM GOAL #2   Title  Pt to report decreased pain with bending, to 0-3/10     Time  2    Period  Weeks    Status  New    Target Date  12/02/18        PT Long Term Goals - 11/22/18 1239      PT LONG TERM GOAL #1   Title  Pt to be independent with final HEP for back.    Time  6    Period  Weeks    Status  New    Target Date  12/30/18      PT LONG TERM GOAL #2   Title  Pt to demo increased strength of hips to be at least 4+/5, and for increased core strength, to see minimal postrual changes with stability exercises, to improve stability and pain.     Time  6    Period  Weeks    Status  New    Target Date  12/30/18      PT LONG TERM GOAL #3   Title  Pt to demo improved ability for bending, lifting, squatting, with mechanics to  be WNL, and for pain to be 0-2/10, to improve ability for IADLs.     Time  6    Period  Weeks    Status  New    Target Date  12/30/18             Plan - 11/22/18 1243    Clinical Impression Statement  Pt presents with primary complaint of increased pain in lumbar region and into R hip. Pt with decreased lumbar ROM, with decreased ability and mechanics for bending, squatting, lifting. She also has stiffness in R hip. She has weakness of bil hips and core, and lack of effective HEP for her dx. Pt with decreased ability for full fucntional activities, due to pain. She has decreased mechanics and endurance for ambulation, due to pain. Pt to benefit from skilled PT to improve deficits, pain, and to teach longterm HEP for safe return to exercise.     Clinical Presentation  Stable    Clinical Decision Making  Low    Rehab Potential  Good    PT Frequency  2x / week    PT Duration  6 weeks    PT Treatment/Interventions  ADLs/Self Care Home Management;Cryotherapy;Electrical Stimulation;Iontophoresis 4mg /ml Dexamethasone;Functional mobility training;Stair training;Gait training;Ultrasound;Traction;Moist Heat;Therapeutic activities;Therapeutic exercise;Balance training;Neuromuscular re-education;Patient/family education;Passive range of motion;Dry needling;Taping    Consulted and Agree with Plan of Care  Patient       Patient will benefit from skilled therapeutic intervention in order to improve the following deficits and impairments:  Abnormal gait, Decreased range of motion, Difficulty walking, Increased muscle spasms, Decreased endurance, Decreased activity tolerance, Pain, Improper body mechanics, Impaired flexibility, Hypomobility, Decreased balance, Decreased strength  Visit Diagnosis: Chronic bilateral low back pain without sciatica     Problem List Patient Active Problem List   Diagnosis Date Noted  .  Osteopenia 10/25/2018  . Situational mixed anxiety and depressive disorder  09/30/2018  . 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 Fibromyalgia 09/11/2018  . Chronic back pain 09/11/2018  . Gastroesophageal reflux disease without esophagitis 09/11/2018  . 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 Opioid contract exists 04/19/2018  . Acute medial meniscal tear 11/26/2017  . Trigger finger 08/17/2017  . Low vitamin D level 07/05/2017  . B12 deficiency 07/05/2017  . Insomnia 07/05/2017  . Chronic right shoulder pain 05/30/2017  . HLD (hyperlipidemia) 05/30/2017    Sedalia MutaLauren , PT, DPT 12:57 PM  11/22/18    [facility] [address] [place], [place], [address] Phone: [phone]   Fax:  [phone]  Name: Patricia Underwood MRN: [id number] Date of Birth: November 22, 1950

## 2018-11-22 NOTE — Patient Instructions (Signed)
Access Code: [email or web address]  URL: [email or web address]  Date: 11/22/2018  Prepared by: Sedalia Muta   Exercises  Correct Seated Posture  Single Knee to Chest Stretch - 3 reps - 30 hold - 2x daily  Seated Hamstring Stretch - 3 reps - 30 hold - 2x daily  Supine Transversus Abdominis Bracing - Hands on Ground - 10 reps - 2 sets - 5 hold - 2x daily  Supine March - 10 reps - 2 sets - 2x daily  Hooklying Isometric Clamshell - 10 reps - 2 sets - 2x daily

## 2018-11-25 ENCOUNTER — Ambulatory Visit (INDEPENDENT_AMBULATORY_CARE_PROVIDER_SITE_OTHER): Payer: Medicare Other | Admitting: Physical Therapy

## 2018-11-25 ENCOUNTER — Encounter: Payer: Self-pay | Admitting: Physical Therapy

## 2018-11-25 ENCOUNTER — Encounter: Payer: Self-pay | Admitting: Family Medicine

## 2018-11-25 ENCOUNTER — Ambulatory Visit (INDEPENDENT_AMBULATORY_CARE_PROVIDER_SITE_OTHER): Payer: Medicare Other | Admitting: Psychology

## 2018-11-25 DIAGNOSIS — F411 Generalized anxiety disorder: Secondary | ICD-10-CM

## 2018-11-25 DIAGNOSIS — G8929 Other chronic pain: Secondary | ICD-10-CM | POA: Diagnosis not present

## 2018-11-25 DIAGNOSIS — M545 Low back pain: Secondary | ICD-10-CM

## 2018-11-26 NOTE — Therapy (Signed)
[facility] [address] [place], [place], [address] Phone: [phone]   Fax:  [phone]  Physical Therapy Treatment  Patient Details  Name: Patricia Underwood MRN: [id number] Date of Birth: Sep 09, 1951 Referring Provider (PT): Helane Rima   Encounter Date: 11/25/2018  PT End of Session - 11/25/18 1444    Visit Number  3    Number of Visits  12    Date for PT Re-Evaluation  12/30/18    Authorization Type  Medicare    PT Start Time  1435    PT Stop Time  1515    PT Time Calculation (min)  40 min    Activity Tolerance  Patient tolerated treatment well    Behavior During Therapy  [facility] for tasks assessed/performed       Past Medical History:  Diagnosis Date  . Chronic back pain   . Chronic right shoulder pain 05/30/2017  . Fibromyalgia   . HLD (hyperlipidemia) 05/30/2017    Past Surgical History:  Procedure Laterality Date  . LUMBAR FUSION     L4-L5    There were no vitals filed for this visit.  Subjective Assessment - 11/25/18 1443    Subjective  Pt states she has not done much of HEP. Both hips sore this am.     Currently in Pain?  Yes    Pain Score  5     Pain Location  Back    Pain Orientation  Right    Pain Descriptors / Indicators  Aching;Sore;Tightness    Pain Type  Chronic pain    Pain Onset  More than a month ago    Pain Frequency  Intermittent                       [facility] Adult PT Treatment/Exercise - 11/25/18 1435      Exercises   Exercises  Lumbar      Lumbar Exercises: Stretches   Active Hamstring Stretch  3 reps;30 seconds    Active Hamstring Stretch Limitations  seated    Single Knee to Chest Stretch  3 reps;30 seconds    Lower Trunk Rotation  5 reps;10 seconds    Pelvic Tilt  20 reps    Piriformis Stretch  3 reps;30 seconds    Piriformis Stretch Limitations  supine    Figure 4 Stretch  2 reps;30 seconds    Figure 4 Stretch Limitations  seated      Lumbar Exercises: Aerobic   Stationary Bike  L1 x 8 min      Lumbar Exercises: Standing   Other Standing Lumbar Exercises  Hip Abd 2x10 bil; Standing march x20;       Lumbar Exercises: Supine   Ab Set  --    Clam  20 reps    Clam Limitations  RTB    Bent Knee Raise  20 reps      Lumbar Exercises: Sidelying   Hip Abduction  20 reps;Both               PT Short Term Goals - 11/22/18 1238      PT SHORT TERM GOAL #1   Title  Pt to be independent with initial HEP    Time  2    Period  Weeks    Status  New    Target Date  12/02/18      PT SHORT TERM GOAL #2   Title  Pt to report decreased pain with bending,  to 0-3/10     Time  2    Period  Weeks    Status  New    Target Date  12/02/18        PT Long Term Goals - 11/22/18 1239      PT LONG TERM GOAL #1   Title  Pt to be independent with final HEP for back.    Time  6    Period  Weeks    Status  New    Target Date  12/30/18      PT LONG TERM GOAL #2   Title  Pt to demo increased strength of hips to be at least 4+/5, and for increased core strength, to see minimal postrual changes with stability exercises, to improve stability and pain.     Time  6    Period  Weeks    Status  New    Target Date  12/30/18      PT LONG TERM GOAL #3   Title  Pt to demo improved ability for bending, lifting, squatting, with mechanics to be WNL, and for pain to be 0-2/10, to improve ability for IADLs.     Time  6    Period  Weeks    Status  New    Target Date  12/30/18            Plan - 11/26/18 1324    Clinical Impression Statement  Pt with increased soreness in bil hips today. Added stretching for this for HEP/piriformis stretch. Pt requires max cuing for correct mechanics with ther ex. She will benefit from continued education on stretching and strengthening for hips and core.     Rehab Potential  Good    PT Frequency  2x / week    PT Duration  6 weeks    PT Treatment/Interventions  ADLs/Self Care Home Management;Cryotherapy;Electrical  Stimulation;Iontophoresis 4mg /ml Dexamethasone;Functional mobility training;Stair training;Gait training;Ultrasound;Traction;Moist Heat;Therapeutic activities;Therapeutic exercise;Balance training;Neuromuscular re-education;Patient/family education;Passive range of motion;Dry needling;Taping    Consulted and Agree with Plan of Care  Patient       Patient will benefit from skilled therapeutic intervention in order to improve the following deficits and impairments:  Abnormal gait, Decreased range of motion, Difficulty walking, Increased muscle spasms, Decreased endurance, Decreased activity tolerance, Pain, Improper body mechanics, Impaired flexibility, Hypomobility, Decreased balance, Decreased strength  Visit Diagnosis: Chronic bilateral low back pain without sciatica     Problem List Patient Active Problem List   Diagnosis Date Noted  . Osteopenia 10/25/2018  . Situational mixed anxiety and depressive disorder 09/30/2018  . 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 Fibromyalgia 09/11/2018  . Chronic back pain 09/11/2018  . Gastroesophageal reflux disease without esophagitis 09/11/2018  . 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 Opioid contract exists 04/19/2018  . Acute medial meniscal tear 11/26/2017  . Trigger finger 08/17/2017  . Low vitamin D level 07/05/2017  . B12 deficiency 07/05/2017  . Insomnia 07/05/2017  . Chronic right shoulder pain 05/30/2017  . HLD (hyperlipidemia) 05/30/2017    Sedalia MutaLauren , PT, DPT 1:26 PM  11/26/18    [facility] [address] [address] [place], [place], [address] Phone: [phone]   Fax:  [phone]  Name: Patricia Underwood MRN: [id number] Date of Birth: [birth year]

## 2018-12-09 ENCOUNTER — Encounter: Payer: Self-pay | Admitting: Physical Therapy

## 2018-12-09 ENCOUNTER — Ambulatory Visit (INDEPENDENT_AMBULATORY_CARE_PROVIDER_SITE_OTHER): Payer: Medicare Other | Admitting: Psychology

## 2018-12-09 ENCOUNTER — Ambulatory Visit (INDEPENDENT_AMBULATORY_CARE_PROVIDER_SITE_OTHER): Payer: Medicare Other | Admitting: Physical Therapy

## 2018-12-09 DIAGNOSIS — G8929 Other chronic pain: Secondary | ICD-10-CM | POA: Diagnosis not present

## 2018-12-09 DIAGNOSIS — F411 Generalized anxiety disorder: Secondary | ICD-10-CM

## 2018-12-09 DIAGNOSIS — M545 Low back pain, unspecified: Secondary | ICD-10-CM
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
## 2018-12-16 ENCOUNTER — Encounter: Payer: Self-pay | Admitting: Physician Assistant

## 2018-12-21 ENCOUNTER — Telehealth: Payer: Self-pay | Admitting: Family Medicine

## 2018-12-21 ENCOUNTER — Encounter: Payer: Self-pay | Admitting: Physical Therapy

## 2018-12-21 ENCOUNTER — Ambulatory Visit (INDEPENDENT_AMBULATORY_CARE_PROVIDER_SITE_OTHER): Payer: Medicare Other | Admitting: Physical Therapy

## 2018-12-21 ENCOUNTER — Encounter

## 2018-12-21 DIAGNOSIS — G8929 Other chronic pain: Secondary | ICD-10-CM

## 2018-12-21 DIAGNOSIS — M545 Low back pain, unspecified: Secondary | ICD-10-CM

## 2018-12-21 NOTE — Telephone Encounter (Signed)
Spoke to [employer] offered for tomorrow patient declined we have made app for Monday

## 2018-12-21 NOTE — Telephone Encounter (Signed)
Pt is being seen with Lauren today for PT and mentioned "New onset of dizziness/ disoriented" Soonest appt I see is 01/05/2019. Please advise if pt can be worked in or see someone else.
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
## 2018-12-23 ENCOUNTER — Ambulatory Visit (INDEPENDENT_AMBULATORY_CARE_PROVIDER_SITE_OTHER): Payer: Medicare Other | Admitting: Psychology

## 2018-12-23 DIAGNOSIS — F411 Generalized anxiety disorder: Secondary | ICD-10-CM | POA: Diagnosis not present

## 2018-12-26 NOTE — Progress Notes (Signed)
Patricia Underwood is a 68 y.o. female here for an acute visit.  History of Present Illness:   HPI: Vertigo symptoms x a few days. Worse with turning head. No N/V/D. No trauma or fall. No treatment. No HA.  PMHx, SurgHx, SocialHx, Medications, and Allergies were reviewed in the Visit Navigator and updated as appropriate.  Current Medications   .  albuterol (PROVENTIL HFA;VENTOLIN HFA) 108 (90 Base) MCG/ACT inhaler, Inhale 2 puffs into the lungs every 6 (six) hours as needed for wheezing or shortness of breath., Disp: 1 Inhaler, Rfl: 0 .  atorvastatin (LIPITOR) 20 MG tablet, Take 1 tablet (20 mg total) by mouth daily., Disp: 90 tablet, Rfl: 3 .  atorvastatin (LIPITOR) 20 MG tablet, , Disp: , Rfl:  .  cholecalciferol (VITAMIN D) 1000 units tablet, Take 4,000 Units by mouth daily. , Disp: , Rfl:  .  LORazepam (ATIVAN) 2 MG tablet, Take 1 tablet (2 mg total) by mouth at bedtime., Disp: 90 tablet, Rfl: 1 .  omeprazole (PRILOSEC) 40 MG capsule, Take 1 capsule (40 mg total) by mouth daily., Disp: 30 capsule, Rfl: 3 .  oxyCODONE-acetaminophen (PERCOCET) 10-325 MG tablet, Take 1 tablet by mouth every 4 (four) hours as needed for pain., Disp: 180 tablet, Rfl: 0 .  [START ON 01/06/2019] oxyCODONE-acetaminophen (PERCOCET) 10-325 MG tablet, Take 1 tablet by mouth every 4 (four) hours as needed for pain., Disp: 180 tablet, Rfl: 0 .  oxyCODONE-acetaminophen (PERCOCET) 10-325 MG tablet, Take 1 tablet by mouth every 4 (four) hours as needed for pain., Disp: 180 tablet, Rfl: 0 .  predniSONE (DELTASONE) 5 MG tablet, 6-5-4-3-2-1-off, Disp: 21 tablet, Rfl: 0   Allergies  Allergen Reactions  . Ambien [Zolpidem Tartrate]   . Flexeril [Cyclobenzaprine] Other (See Comments)    Patient unsure of reaction  . Naproxen Other (See Comments)    Patient unsure of reaction.   Review of Systems   Pertinent items are noted in the HPI. Otherwise, ROS is negative.  Vitals   Vitals:   12/27/18 1139  BP: 120/82  Pulse: 82   Temp: 98.6 F (37 C)  TempSrc: Oral  SpO2: 98%  Weight: 118 lb 6.4 oz (53.7 kg)  Height: 5\' 5"  (1.651 m)     Body mass index is 19.7 kg/m.  Physical Exam   Physical Exam Vitals signs and nursing note reviewed.  HENT:     Head: Normocephalic and atraumatic.  Eyes:     Pupils: Pupils are equal, round, and reactive to light.  Neck:     Musculoskeletal: Normal range of motion and neck supple.  Cardiovascular:     Rate and Rhythm: Normal rate and regular rhythm.     Heart sounds: Normal heart sounds.  Pulmonary:     Effort: Pulmonary effort is normal.  Abdominal:     Palpations: Abdomen is soft.  Skin:    General: Skin is warm.  Psychiatric:        Behavior: Behavior normal.      Results for orders placed or performed in visit on 11/16/18  HM MAMMOGRAPHY  Result Value Ref Range   HM Mammogram 0-4 Bi-Rad 0-4 Bi-Rad, Self Reported Normal    Assessment and Plan   Patricia Underwood was seen today for follow-up.  Diagnoses and all orders for this visit:  Vertigo -     meclizine (ANTIVERT) 25 MG tablet; 1/2 to 1 tab qhs prn vertigo  B12 deficiency Comments: Injection today. Orders: -     cyanocobalamin ((VITAMIN B-12)) injection  1,000 mcg  Osteopenia, unspecified location Comments: With Hx of fracture, should consider treatment.   Chronic back pain, unspecified back location, unspecified back pain laterality Comments: Stable. Continue current treatment.     . Reviewed expectations re: course of current medical issues. . Discussed self-management of symptoms. . Outlined signs and symptoms indicating need for more acute intervention. . Patient verbalized understanding and all questions were answered. [name] Health Maintenance issues including appropriate healthy diet, exercise, and smoking avoidance were discussed with patient. . See orders for this visit as documented in the electronic medical record. . Patient received an After Visit Summary.  Patricia Rima,  DO [place] 01/09/2019

## 2018-12-27 ENCOUNTER — Ambulatory Visit (INDEPENDENT_AMBULATORY_CARE_PROVIDER_SITE_OTHER): Payer: Medicare Other | Admitting: Family Medicine

## 2018-12-27 VITALS — BP 120/82 | HR 82 | Temp 98.6°F | Ht 65.0 in | Wt 118.4 lb

## 2018-12-27 DIAGNOSIS — M549 Dorsalgia, unspecified: Secondary | ICD-10-CM

## 2018-12-27 DIAGNOSIS — G8929 Other chronic pain: Secondary | ICD-10-CM | POA: Diagnosis not present

## 2018-12-27 DIAGNOSIS — R42 Dizziness and giddiness: Secondary | ICD-10-CM

## 2018-12-27 DIAGNOSIS — E538 Deficiency of other specified B group vitamins: Secondary | ICD-10-CM

## 2018-12-27 DIAGNOSIS — M8580 Other specified disorders of bone density and structure, unspecified site: Secondary | ICD-10-CM | POA: Diagnosis not present

## 2018-12-27 MED ORDER — MECLIZINE HCL 25 MG PO TABS: ORAL_TABLET | ORAL | 0 refills | Status: DC

## 2018-12-27 MED ORDER — CYANOCOBALAMIN 1000 MCG/ML IJ SOLN
1000.0000 ug | Freq: Once | INTRAMUSCULAR | Status: AC
  Administered 2018-12-27: 1000 ug via INTRAMUSCULAR

## 2018-12-28 ENCOUNTER — Telehealth: Payer: Self-pay

## 2018-12-28 NOTE — Telephone Encounter (Signed)
Called pharmacy to give verbal ok to fill oxycodone on 2/22 per [name]. Spoke to [place]. She is not the normal pharmacist for that store asked that we call back tomorrow to talk with them or the pharmacy manager.

## 2018-12-31 NOTE — Telephone Encounter (Signed)
Called and gave pharmacy verbal information called patient and let her know as well.

## 2019-01-09 ENCOUNTER — Encounter: Payer: Self-pay | Admitting: Family Medicine

## 2019-01-13 ENCOUNTER — Encounter: Payer: Medicare Other | Admitting: Physical Therapy

## 2019-01-18 ENCOUNTER — Encounter: Payer: Medicare Other | Admitting: Physical Therapy

## 2019-01-19 ENCOUNTER — Encounter: Payer: Medicare Other | Admitting: Physical Therapy

## 2019-01-19 ENCOUNTER — Encounter: Payer: Self-pay | Admitting: Family Medicine

## 2019-01-19 ENCOUNTER — Other Ambulatory Visit: Payer: Self-pay

## 2019-01-19 ENCOUNTER — Ambulatory Visit (INDEPENDENT_AMBULATORY_CARE_PROVIDER_SITE_OTHER): Payer: Medicare Other | Admitting: Family Medicine

## 2019-01-19 VITALS — BP 118/82 | HR 76 | Temp 98.6°F | Ht 65.0 in | Wt 117.0 lb

## 2019-01-19 DIAGNOSIS — R05 Cough: Secondary | ICD-10-CM | POA: Diagnosis not present

## 2019-01-19 DIAGNOSIS — M549 Dorsalgia, unspecified: Secondary | ICD-10-CM | POA: Diagnosis not present

## 2019-01-19 DIAGNOSIS — G8929 Other chronic pain: Secondary | ICD-10-CM | POA: Diagnosis not present

## 2019-01-19 DIAGNOSIS — B9689 Other specified bacterial agents as the cause of diseases classified elsewhere: Secondary | ICD-10-CM

## 2019-01-19 DIAGNOSIS — E538 Deficiency of other specified B group vitamins: Secondary | ICD-10-CM

## 2019-01-19 DIAGNOSIS — F5102 Adjustment insomnia: Secondary | ICD-10-CM

## 2019-01-19 DIAGNOSIS — F4323 Adjustment disorder with mixed anxiety and depressed mood: Secondary | ICD-10-CM

## 2019-01-19 DIAGNOSIS — J329 Chronic sinusitis, unspecified: Secondary | ICD-10-CM

## 2019-01-19 DIAGNOSIS — R059 Cough, unspecified: Secondary | ICD-10-CM

## 2019-01-19 MED ORDER — OXYCODONE HCL 10 MG PO TABS: 10.0000 mg | ORAL_TABLET | Freq: Four times a day (QID) | ORAL | 0 refills | Status: DC | PRN

## 2019-01-19 MED ORDER — LORAZEPAM 2 MG PO TABS: 2.0000 mg | ORAL_TABLET | Freq: Every day | ORAL | 1 refills | Status: DC

## 2019-01-19 MED ORDER — ALBUTEROL SULFATE HFA 108 (90 BASE) MCG/ACT IN AERS: 2.0000 | INHALATION_SPRAY | Freq: Four times a day (QID) | RESPIRATORY_TRACT | 0 refills | Status: DC | PRN

## 2019-01-19 MED ORDER — CYANOCOBALAMIN 1000 MCG/ML IJ SOLN
1000.0000 ug | Freq: Once | INTRAMUSCULAR | Status: AC
  Administered 2019-01-19: 1000 ug via INTRAMUSCULAR

## 2019-01-19 NOTE — Progress Notes (Signed)
Patricia Underwood is a 68 y.o. female is here for follow up.  Assessment and Plan:   Patricia Underwood was seen today for follow-up.  Diagnoses and all orders for this visit:  B12 deficiency Comments: Injection today. Orders: -     cyanocobalamin ((VITAMIN B-12)) injection 1,000 mcg  Adjustment insomnia -     LORazepam (ATIVAN) 2 MG tablet; Take 1 tablet (2 mg total) by mouth at bedtime.  Situational mixed anxiety and depressive disorder -     LORazepam (ATIVAN) 2 MG tablet; Take 1 tablet (2 mg total) by mouth at bedtime.  Bacterial sinusitis -     azithromycin (ZITHROMAX) 250 MG tablet; 2 tab on day one then one a day until finished  Cough -     albuterol (PROVENTIL HFA;VENTOLIN HFA) 108 (90 Base) MCG/ACT inhaler; Inhale 2 puffs into the lungs every 6 (six) hours as needed for wheezing or shortness of breath.  Chronic back pain, unspecified back location, unspecified back pain laterality Comments: Okay Rx to fill early. Will follow up after.  -     Oxycodone HCl 10 MG TABS; Take 1 tablet (10 mg total) by mouth every 6 (six) hours as needed.  Subjective:   HPI: Patient and husband intend to self-isolate during COVID-19. She plans to cancel PT, therapy, and up Neurology appointment. Would like to go ahead and pick up medications. Has supplies at home to stay there for a few weeks.   Health Maintenance:  There are no preventive care reminders to display for this patient. Depression screen [facility] 2/9 09/11/2018 08/28/2017 05/26/2017  Decreased Interest 0 0 0  Down, Depressed, Hopeless 0 0 0  PHQ - 2 Score 0 0 0   PMHx, SurgHx, SocialHx, FamHx, Medications, and Allergies were reviewed in the Visit Navigator and updated as appropriate.   Patient Active Problem List   Diagnosis Date Noted  . Osteopenia 10/25/2018  . Situational mixed anxiety and depressive disorder 09/30/2018  . 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 Fibromyalgia 09/11/2018  . Chronic back pain 09/11/2018  . Gastroesophageal reflux  disease without esophagitis 09/11/2018  . 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 Opioid contract exists 04/19/2018  . Acute medial meniscal tear 11/26/2017  . Trigger finger 08/17/2017  . Low vitamin D level 07/05/2017  . B12 deficiency 07/05/2017  . Insomnia 07/05/2017  . Chronic right shoulder pain 05/30/2017  . HLD (hyperlipidemia) 05/30/2017   Social History   Tobacco Use  . Smoking status: Former Games developer  . Smokeless tobacco: Never Used  Substance Use Topics  . Alcohol use: Yes    Alcohol/week: 7.0 standard drinks    Types: 7 Glasses of wine per week  . Drug use: No   Current Medications and Allergies:   Current Outpatient Medications:  .  albuterol (PROVENTIL HFA;VENTOLIN HFA) 108 (90 Base) MCG/ACT inhaler, Inhale 2 puffs into the lungs every 6 (six) hours as needed for wheezing or shortness of breath., Disp: 1 Inhaler, Rfl: 0 .  atorvastatin (LIPITOR) 20 MG tablet, , Disp: , Rfl:  .  cholecalciferol (VITAMIN D) 1000 units tablet, Take 4,000 Units by mouth daily. , Disp: , Rfl:  .  meclizine (ANTIVERT) 25 MG tablet, 1/2 to 1 tab qhs prn vertigo, Disp: 30 tablet, Rfl: 0 .  omeprazole (PRILOSEC) 40 MG capsule, Take 1 capsule (40 mg total) by mouth daily., Disp: 30 capsule, Rfl: 3 .  azithromycin (ZITHROMAX) 250 MG tablet, 2 tab on day one then one a day until  finished, Disp: 6 tablet, Rfl: 0 .  LORazepam (ATIVAN) 2 MG tablet, Take 1 tablet (2 mg total) by mouth at bedtime., Disp: 90 tablet, Rfl: 1 .  Oxycodone HCl 10 MG TABS, Take 1 tablet (10 mg total) by mouth every 4 (four) hours., Disp: 180 tablet, Rfl: 0   Allergies  Allergen Reactions  . Ambien [Zolpidem Tartrate]   . Flexeril [Cyclobenzaprine] Other (See Comments)    Patient unsure of reaction  . Naproxen Other (See Comments)    Patient unsure of reaction.   Review of Systems   Pertinent items are noted in the HPI. Otherwise, ROS is negative.  Vitals:   Vitals:   01/19/19 1539  BP:  118/82  Pulse: 76  Temp: 98.6 F (37 C)  TempSrc: Oral  SpO2: 97%  Weight: 117 lb (53.1 kg)  Height: 5\' 5"  (1.651 m)     Body mass index is 19.47 kg/m. Physical Exam:   General: Cooperative, alert and oriented, well developed, well nourished, in no acute distress. HEENT: Pupils equal round reactive light and extraocular movements intact. Conjunctivae and lids unremarkable. No pallor or cyanosis, dentition good. Neck: No thyromegaly.  Cardiovascular: Regular rhythm. No murmurs appreciated.  Lungs: Normal work of breathing. Clear bilaterally without rales, rhonchi, or wheezing.  Abdomen: Soft, nontender, no masses. Normal bowel sounds. Extremities: No clubbing, cyanosis, erythema. No edema.  Skin: Warm and dry. Neurologic: No focal deficits.  Psychiatric: Normal affect and thought content.   . Reviewed expectations re: course of current medical issues. . Discussed self-management of symptoms. . Outlined signs and symptoms indicating need for more acute intervention. . Patient verbalized understanding and all questions were answered. [name] Health Maintenance issues including appropriate healthy diet, exercise, and smoking avoidance were discussed with patient. . See orders for this visit as documented in the electronic medical record. . Patient received an After Visit Summary.  Helane Rima, DO [place], [facility] 01/22/2019

## 2019-01-20 ENCOUNTER — Telehealth: Payer: Self-pay | Admitting: Family Medicine

## 2019-01-20 ENCOUNTER — Other Ambulatory Visit: Payer: Self-pay

## 2019-01-20 MED ORDER — OXYCODONE HCL 10 MG PO TABS: 10.0000 mg | ORAL_TABLET | ORAL | 0 refills | Status: DC

## 2019-01-20 MED ORDER — AZITHROMYCIN 250 MG PO TABS: ORAL_TABLET | ORAL | 0 refills | Status: DC

## 2019-01-20 NOTE — Telephone Encounter (Signed)
Clarify message - aspirin with oxycodone? She is picking up medication early so the she can try to isolate at home during COVID spread.

## 2019-01-20 NOTE — Telephone Encounter (Signed)
New script printed called patient l/m to let her know at reception for pick up.

## 2019-01-20 NOTE — Telephone Encounter (Signed)
See note

## 2019-01-20 NOTE — Telephone Encounter (Signed)
Please advise  Patient brought one script here I have at my desk

## 2019-01-20 NOTE — Telephone Encounter (Signed)
Copied from CRM [id number]. Topic: Quick Communication - Rx Refill/Question >> Jan 20, 2019 10:29 AM Baldo Daub L wrote: Medication: Oxycodone HCl 10 MG TABS   Pt states she was seen yesterday and she was given a RX that doesn't read correctly.  Pt states that it was a typed RX for  Oxycodone HCl 10 MG TABS . Pt states that it states to take every 6 hours and it is supposed to read take every 4 hours.  Pt wants to know if this can be corrected and if she can bring the script she has and turn it in for the new script. Pt can be reached at [phone]

## 2019-01-20 NOTE — Telephone Encounter (Signed)
Yes. Oxycodone 10 mg to take one po q 4 hours, #180.

## 2019-01-20 NOTE — Telephone Encounter (Signed)
FYI

## 2019-01-20 NOTE — Telephone Encounter (Signed)
Patient has taken the Oxycodone HCl 10 MG TABS prescription to [facility] to fill.  Pam at [facility] [phone] stated the patient filled a prescription for 180 tablets of aspirin w/oxycodone on 01/01/19, and this prescription also has 180 tablets.  They are afraid the patient is getting to much Oxycodone.  Please advise.

## 2019-01-21 ENCOUNTER — Other Ambulatory Visit: Payer: Self-pay

## 2019-01-21 MED ORDER — OXYCODONE HCL 10 MG PO TABS: 10.0000 mg | ORAL_TABLET | ORAL | 0 refills | Status: DC

## 2019-01-21 NOTE — Telephone Encounter (Signed)
Caller name: Judeth Cornfield  Relation to pt: Pharmacist  Call back number: Best # due to pharmacist main line not working  [phone] or  [phone]   Reason for call:  In need of permission to fill medication early, please advise regarding message mentioned below.

## 2019-01-21 NOTE — Telephone Encounter (Signed)
Pt is calling and pharm lines are down per pt and the rx for oxycodone . The pharm is waiting to hear back from dr wallace per pt.

## 2019-01-21 NOTE — Telephone Encounter (Signed)
Please see msg and advise.  

## 2019-01-21 NOTE — Telephone Encounter (Signed)
See note

## 2019-01-22 ENCOUNTER — Encounter: Payer: Self-pay | Admitting: Family Medicine

## 2019-01-22 NOTE — Telephone Encounter (Signed)
Barnie Mort, CMA, spoke with patient in office. Rx picked up.

## 2019-01-24 ENCOUNTER — Ambulatory Visit: Payer: Medicare Other | Admitting: Family Medicine

## 2019-02-03 ENCOUNTER — Ambulatory Visit: Payer: Medicare Other | Admitting: Psychology

## 2019-02-10 ENCOUNTER — Ambulatory Visit: Payer: Medicare Other | Admitting: Neurology

## 2019-02-15 ENCOUNTER — Other Ambulatory Visit: Payer: Self-pay | Admitting: Family Medicine

## 2019-02-15 DIAGNOSIS — R05 Cough: Secondary | ICD-10-CM

## 2019-02-15 DIAGNOSIS — R059 Cough, unspecified: Secondary | ICD-10-CM

## 2019-03-03 NOTE — Progress Notes (Signed)
Virtual Visit via Video   I connected with Patricia Underwood on 03/04/19 at  9:40 AM EDT by a video enabled telemedicine application and verified that I am speaking with the correct person using two identifiers. Location patient: Home Location provider: [facility], Office Persons participating in the virtual visit: Patricia Underwood, Helane Rima, DO Barnie Mort, CMA acting as scribe for Dr. Helane Rima.   I discussed the limitations of evaluation and management by telemedicine and the availability of in person appointments. The patient expressed understanding and agreed to proceed.  Subjective:   HPI: Patient has ear pain x 3 days. She states she got her new hearing aid and has had some discomfort after using it. It was beeping and she kept putting in deeper. She has a lot of irritation in the ear. She has not been wearing her hearing aid due to discomfort. We will have patient try ear drops that we will call in. If that does not help we will have her come in to check ears.   She is having increased depression being confined at home with her husband (previously with marital issues) so she is planing on going to stay with sister for a few weeks.   Reviewed all precautions and expectations with prevention of Covid-19.   Review of Systems  Constitutional: Negative for chills, fever and weight loss.  HENT: Positive for ear pain. Negative for ear discharge and hearing loss.   Eyes: Negative for blurred vision and double vision.  Respiratory: Negative for cough.   Cardiovascular: Negative for chest pain and palpitations.  Gastrointestinal: Negative for heartburn and nausea.  Genitourinary: Negative for dysuria.  Musculoskeletal: Negative.   Skin: Positive for rash.       After walking with dog in woods. Has rash on legs. She will send picture via my chart.   Neurological: Negative.   Endo/Heme/Allergies: Negative.   Psychiatric/Behavioral: Negative.    ROS: See pertinent positives and  negatives per HPI.  Patient Active Problem List   Diagnosis Date Noted  . Osteopenia 10/25/2018  . Situational mixed anxiety and depressive disorder 09/30/2018  . 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 Fibromyalgia 09/11/2018  . Chronic back pain 09/11/2018  . Gastroesophageal reflux disease without esophagitis 09/11/2018  . 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 Opioid contract exists 04/19/2018  . Acute medial meniscal tear 11/26/2017  . Trigger finger 08/17/2017  . Low vitamin D level 07/05/2017  . B12 deficiency 07/05/2017  . Insomnia 07/05/2017  . Chronic right shoulder pain 05/30/2017  . HLD (hyperlipidemia) 05/30/2017    Social History   Tobacco Use  . Smoking status: Former Games developer  . Smokeless tobacco: Never Used  Substance Use Topics  . Alcohol use: Yes    Alcohol/week: 7.0 standard drinks    Types: 7 Glasses of wine per week   Current Outpatient Medications:  .  albuterol (PROVENTIL HFA;VENTOLIN HFA) 108 (90 Base) MCG/ACT inhaler, INHALE 2 PUFFS INTO THE LUNGS EVERY 6 HOURS AS NEEDED FOR WHEEZING OR FOR SHORTNESS OF BREATH, Disp: 1 Inhaler, Rfl: 3 .  atorvastatin (LIPITOR) 20 MG tablet, , Disp: , Rfl:  .  LORazepam (ATIVAN) 2 MG tablet, Take 1 tablet (2 mg total) by mouth at bedtime., Disp: 90 tablet, Rfl: 1 .  Oxycodone HCl 10 MG TABS, Take 1 tablet (10 mg total) by mouth every 4 (four) hours., Disp: 180 tablet, Rfl: 0  Allergies  Allergen Reactions  . Ambien [Zolpidem Tartrate]   . Flexeril [  Cyclobenzaprine] Other (See Comments)    Patient unsure of reaction  . Naproxen Other (See Comments)    Patient unsure of reaction.    Objective:   VITALS: Per patient if applicable, see vitals. GENERAL: Alert, appears well and in no acute distress. HEENT: Atraumatic, conjunctiva clear, no obvious abnormalities on inspection of external nose and ears. NECK: Normal movements of the head and neck. CARDIOPULMONARY: No increased WOB. Speaking in clear  sentences. I:E ratio WNL.  MS: Moves all visible extremities without noticeable abnormality. PSYCH: Pleasant and cooperative, well-groomed. Speech normal rate and rhythm. Affect is appropriate. Insight and judgement are appropriate. Attention is focused, linear, and appropriate.  NEURO: CN grossly intact. Oriented as arrived to appointment on time with no prompting. Moves both UE equally.  SKIN: See MyChart message for uploaded picture.   Assessment and Plan:   Aanshi was seen today for ear pain.  Diagnoses and all orders for this visit:  Right ear pain -     NEOMYCIN-POLYMYXIN-HYDROCORTISONE (CORTISPORIN) 1 % SOLN OTIC solution; Place 3 drops into the right ear every 4 (four) hours.  Bug bite, initial encounter -     triamcinolone (KENALOG) 0.025 % ointment; Apply 1 application topically 2 (two) times daily.   . Reviewed expectations re: course of current medical issues. . Discussed self-management of symptoms. . Outlined signs and symptoms indicating need for more acute intervention. . Patient verbalized understanding and all questions were answered. [name] Health Maintenance issues including appropriate healthy diet, exercise, and smoking avoidance were discussed with patient. . See orders for this visit as documented in the electronic medical record.  Helane Rima, DO 03/04/2019   Records requested if needed. Time spent: 25 minutes, of which >50% was spent in obtaining information about her symptoms, reviewing her previous labs, evaluations, and treatments, counseling her about her condition (please see the discussed topics above), and developing a plan to further investigate it; she had a number of questions which I addressed.

## 2019-03-04 ENCOUNTER — Other Ambulatory Visit: Payer: Self-pay | Admitting: Family Medicine

## 2019-03-04 ENCOUNTER — Encounter: Payer: Self-pay | Admitting: Family Medicine

## 2019-03-04 ENCOUNTER — Ambulatory Visit (INDEPENDENT_AMBULATORY_CARE_PROVIDER_SITE_OTHER): Payer: Medicare Other | Admitting: Family Medicine

## 2019-03-04 ENCOUNTER — Other Ambulatory Visit: Payer: Self-pay

## 2019-03-04 VITALS — Temp 96.5°F | Ht 65.0 in | Wt 117.0 lb

## 2019-03-04 DIAGNOSIS — T148XXA Other injury of unspecified body region, initial encounter: Secondary | ICD-10-CM | POA: Diagnosis not present

## 2019-03-04 DIAGNOSIS — H9201 Otalgia, right ear: Secondary | ICD-10-CM

## 2019-03-04 DIAGNOSIS — W57XXXA Bitten or stung by nonvenomous insect and other nonvenomous arthropods, initial encounter: Secondary | ICD-10-CM

## 2019-03-04 MED ORDER — TRIAMCINOLONE ACETONIDE 0.025 % EX OINT: 1.0000 "application " | TOPICAL_OINTMENT | Freq: Two times a day (BID) | CUTANEOUS | 0 refills | Status: DC

## 2019-03-04 MED ORDER — NEOMYCIN-POLYMYXIN-HC 1 % OT SOLN: 3.0000 [drp] | OTIC | 2 refills | Status: DC

## 2019-03-04 MED ORDER — OXYCODONE HCL 10 MG PO TABS: 10.0000 mg | ORAL_TABLET | ORAL | 0 refills | Status: DC

## 2019-03-04 NOTE — Telephone Encounter (Signed)
See note

## 2019-03-04 NOTE — Telephone Encounter (Signed)
Pt called back checking on medication.  Please call back

## 2019-03-04 NOTE — Telephone Encounter (Signed)
Forwarding back to [name].  Rx for Oxycodone pending

## 2019-03-04 NOTE — Telephone Encounter (Signed)
Last OV 03/04/19, med check 01/19/19 Last refill 01/21/19 #180/0 Next OV not scheduled  Forwarding to [name].

## 2019-03-04 NOTE — Telephone Encounter (Signed)
Copied from CRM [id number]. Topic: Quick Communication - Rx Refill/Question >> Mar 04, 2019 12:03 PM Rica Koyanagi, Barbee Cough wrote: Medication: oxycodone   Has the patient contacted their pharmacy? Yes.   (Agent: If no, request that the patient contact the pharmacy for the refill.) (Agent: If yes, when and what did the pharmacy advise?)  Preferred Pharmacy (with phone number or street name): [facility]  Please call pt, the pain meds have not been called in yet and pt wants to go to pharm 1 time to get everything  [phone]   Agent: Please be advised that RX refills may take up to 3 business days. We ask that you follow-up with your pharmacy.

## 2019-03-07 ENCOUNTER — Encounter: Payer: Self-pay | Admitting: Family Medicine

## 2019-03-07 ENCOUNTER — Other Ambulatory Visit: Payer: Self-pay | Admitting: Family Medicine

## 2019-03-07 ENCOUNTER — Other Ambulatory Visit: Payer: Self-pay

## 2019-03-07 ENCOUNTER — Ambulatory Visit (INDEPENDENT_AMBULATORY_CARE_PROVIDER_SITE_OTHER): Payer: Medicare Other | Admitting: Family Medicine

## 2019-03-07 VITALS — Temp 96.5°F | Ht 65.0 in | Wt 117.0 lb

## 2019-03-07 DIAGNOSIS — T161XXA Foreign body in right ear, initial encounter: Secondary | ICD-10-CM

## 2019-03-07 DIAGNOSIS — H9201 Otalgia, right ear: Secondary | ICD-10-CM | POA: Diagnosis not present

## 2019-03-07 DIAGNOSIS — E538 Deficiency of other specified B group vitamins: Secondary | ICD-10-CM | POA: Diagnosis not present

## 2019-03-07 MED ORDER — OXYCODONE HCL 10 MG PO TABS: 10.0000 mg | ORAL_TABLET | ORAL | 0 refills | Status: DC

## 2019-03-07 MED ORDER — CYANOCOBALAMIN 1000 MCG/ML IJ SOLN
1000.0000 ug | Freq: Once | INTRAMUSCULAR | Status: AC
  Administered 2019-03-07: 1000 ug via INTRAMUSCULAR

## 2019-03-07 NOTE — Progress Notes (Signed)
   Patricia Underwood is a 68 y.o. female here for an acute visit.  History of Present Illness:   HPI:   PMHx, SurgHx, SocialHx, Medications, and Allergies were reviewed in the Visit Navigator and updated as appropriate.  Current Medications   Current Outpatient Medications:  .  albuterol (PROVENTIL HFA;VENTOLIN HFA) 108 (90 Base) MCG/ACT inhaler, INHALE 2 PUFFS INTO THE LUNGS EVERY 6 HOURS AS NEEDED FOR WHEEZING OR FOR SHORTNESS OF BREATH, Disp: 1 Inhaler, Rfl: 3 .  atorvastatin (LIPITOR) 20 MG tablet, , Disp: , Rfl:  .  LORazepam (ATIVAN) 2 MG tablet, Take 1 tablet (2 mg total) by mouth at bedtime., Disp: 90 tablet, Rfl: 1 .  NEOMYCIN-POLYMYXIN-HYDROCORTISONE (CORTISPORIN) 1 % SOLN OTIC solution, Place 3 drops into the right ear every 4 (four) hours., Disp: 10 mL, Rfl: 2 .  Oxycodone HCl 10 MG TABS, Take 1 tablet (10 mg total) by mouth every 4 (four) hours., Disp: 180 tablet, Rfl: 0 .  triamcinolone (KENALOG) 0.025 % ointment, Apply 1 application topically 2 (two) times daily., Disp: 30 g, Rfl: 0   Allergies  Allergen Reactions  . Ambien [Zolpidem Tartrate]   . Flexeril [Cyclobenzaprine] Other (See Comments)    Patient unsure of reaction  . Naproxen Other (See Comments)    Patient unsure of reaction.   Review of Systems   Pertinent items are noted in the HPI. Otherwise, ROS is negative.  Vitals   Vitals:   03/07/19 1154  Temp: (!) 96.5 F (35.8 C)  Weight: 117 lb (53.1 kg)  Height: 5\' 5"  (1.651 m)     Body mass index is 19.47 kg/m.   Physical Exam   Physical Exam Constitutional:      General: She is not in acute distress.    Appearance: Normal appearance. She is normal weight.  HENT:     Head: Normocephalic and atraumatic.     Right Ear: Tympanic membrane normal. A foreign body is present.  Neurological:     Mental Status: She is alert.    Assessment and Plan   Patricia Underwood was seen today for ear pain.  Diagnoses and all orders for this visit:  Ear foreign body, right,  initial encounter Comments: Identified using otoscope. Edge of plastic grasped using straght forceps. No complications. Ear canal without abrasion.  B12 deficiency -     cyanocobalamin ((VITAMIN B-12)) injection 1,000 mcg  Right ear pain Comments: Improved after removal of hearing aid tip. Okay to continue drops x 1 week.   . Reviewed expectations re: course of current medical issues. . Discussed self-management of symptoms. . Outlined signs and symptoms indicating need for more acute intervention. . Patient verbalized understanding and all questions were answered. [name] Health Maintenance issues including appropriate healthy diet, exercise, and smoking avoidance were discussed with patient. . See orders for this visit as documented in the electronic medical record. . Patient received an After Visit Summary.  Helane Rima, DO [place], [facility] 03/07/2019

## 2019-03-08 ENCOUNTER — Encounter: Payer: Self-pay | Admitting: Family Medicine

## 2019-03-16 DIAGNOSIS — F4320 Adjustment disorder, unspecified: Secondary | ICD-10-CM | POA: Diagnosis not present

## 2019-03-21 ENCOUNTER — Telehealth: Payer: Self-pay | Admitting: Family Medicine

## 2019-03-21 NOTE — Telephone Encounter (Signed)
Pt scheduled for in-office visit on 03/22/19.

## 2019-03-21 NOTE — Telephone Encounter (Signed)
Patient called and is requesting an in office visit only with [name]. Patient states that she still has ear issues and is refusing to schedule a virtual visit. She also wants to discuss her medications and coming off some of them. Please contact patient to advise on an appointment for today.

## 2019-03-21 NOTE — Progress Notes (Signed)
Patricia Underwood is a 68 y.o. female here for an acute visit.  History of Present Illness:   HPI:   1. Otalgia of left ear. See previous note. Would like ear checked again as it is still tender. No drainage.   2. Chronic back pain, unspecified back location. Unable to continue PT or see PMR during this time. Wants to get off of Oxycodone if possible.   3. Uncomplicated opioid dependence (HCC). Tried to quickly wean last week but had severe withdrawal symptoms. Would like help to get off Oxycodone.    PMHx, SurgHx, SocialHx, Medications, and Allergies were reviewed in the Visit Navigator and updated as appropriate.  Current Medications   Current Outpatient Medications:  .  albuterol (PROVENTIL HFA;VENTOLIN HFA) 108 (90 Base) MCG/ACT inhaler, INHALE 2 PUFFS INTO THE LUNGS EVERY 6 HOURS AS NEEDED FOR WHEEZING OR FOR SHORTNESS OF BREATH, Disp: 1 Inhaler, Rfl: 3 .  atorvastatin (LIPITOR) 20 MG tablet, , Disp: , Rfl:  .  LORazepam (ATIVAN) 2 MG tablet, Take 1 tablet (2 mg total) by mouth at bedtime., Disp: 90 tablet, Rfl: 1 .  NEOMYCIN-POLYMYXIN-HYDROCORTISONE (CORTISPORIN) 1 % SOLN OTIC solution, Place 3 drops into the right ear every 4 (four) hours., Disp: 10 mL, Rfl: 2 .  Oxycodone HCl 10 MG TABS, Take 1 tablet (10 mg total) by mouth every 4 (four) hours., Disp: 180 tablet, Rfl: 0 .  triamcinolone (KENALOG) 0.025 % ointment, Apply 1 application topically 2 (two) times daily., Disp: 30 g, Rfl: 0   Allergies  Allergen Reactions  . Ambien [Zolpidem Tartrate]   . Flexeril [Cyclobenzaprine] Other (See Comments)    Patient unsure of reaction  . Naproxen Other (See Comments)    Patient unsure of reaction.   Review of Systems   Pertinent items are noted in the HPI. Otherwise, ROS is negative.  Vitals   Vitals:   03/22/19 1051  BP: 120/72  Temp: 98.6 F (37 C)  TempSrc: Oral  Weight: 114 lb (51.7 kg)  Height: 5\' 5"  (1.651 m)     Body mass index is 18.97 kg/m.  Physical Exam    Physical Exam Vitals signs and nursing note reviewed.  HENT:     Head: Normocephalic and atraumatic.     Ears:     Comments: Left ear with healing abrasion.  Eyes:     Pupils: Pupils are equal, round, and reactive to light.  Neck:     Musculoskeletal: Normal range of motion and neck supple.  Cardiovascular:     Rate and Rhythm: Normal rate and regular rhythm.     Heart sounds: Normal heart sounds.  Pulmonary:     Effort: Pulmonary effort is normal.  Abdominal:     Palpations: Abdomen is soft.  Skin:    General: Skin is warm.  Psychiatric:        Behavior: Behavior normal.    Assessment and Plan   [name] was seen today for follow-up.  Diagnoses and all orders for this visit:  Otalgia of left ear Comments: Healing abrasion without other findings.   Chronic back pain, unspecified back location, unspecified back pain laterality -     Oxycodone HCl 10 MG TABS; 1 tab 6x/day x 1 week, then 5x/day x 1 week, then 4x/day x 1 week  Uncomplicated opioid dependence (HCC) Comments: Reviewed options for weaning v other medication (Suboxone v Methadone).    . Reviewed expectations re: course of current medical issues. . Discussed self-management of symptoms. . Outlined signs  and symptoms indicating need for more acute intervention. . Patient verbalized understanding and all questions were answered. [name] Health Maintenance issues including appropriate healthy diet, exercise, and smoking avoidance were discussed with patient. . See orders for this visit as documented in the electronic medical record. . Patient received an After Visit Summary.  CMA served as Neurosurgeon during this visit. History, Physical, and Plan performed by medical provider. The above documentation has been reviewed and is accurate and complete. Helane Rima, D.O.  Records requested if needed. Time spent: 25 minutes, of which >50% was spent in obtaining information about her symptoms, reviewing her previous labs,  evaluations, and treatments, counseling her about her condition (please see the discussed topics above), and developing a plan to further investigate it; she had a number of questions which I addressed.   Helane Rima, DO [place], [facility] 03/22/2019

## 2019-03-22 ENCOUNTER — Other Ambulatory Visit: Payer: Self-pay

## 2019-03-22 ENCOUNTER — Ambulatory Visit (INDEPENDENT_AMBULATORY_CARE_PROVIDER_SITE_OTHER): Payer: Medicare Other | Admitting: Family Medicine

## 2019-03-22 ENCOUNTER — Encounter: Payer: Self-pay | Admitting: Family Medicine

## 2019-03-22 VITALS — BP 120/72 | Temp 98.6°F | Ht 65.0 in | Wt 114.0 lb

## 2019-03-22 DIAGNOSIS — F1120 Opioid dependence, uncomplicated: Secondary | ICD-10-CM | POA: Diagnosis not present

## 2019-03-22 DIAGNOSIS — G8929 Other chronic pain: Secondary | ICD-10-CM | POA: Diagnosis not present

## 2019-03-22 DIAGNOSIS — H9202 Otalgia, left ear: Secondary | ICD-10-CM

## 2019-03-22 DIAGNOSIS — M549 Dorsalgia, unspecified: Secondary | ICD-10-CM

## 2019-03-22 MED ORDER — OXYCODONE HCL 10 MG PO TABS: ORAL_TABLET | ORAL | 0 refills | Status: DC

## 2019-04-07 ENCOUNTER — Ambulatory Visit (INDEPENDENT_AMBULATORY_CARE_PROVIDER_SITE_OTHER): Payer: Medicare Other | Admitting: Family Medicine

## 2019-04-07 ENCOUNTER — Other Ambulatory Visit: Payer: Self-pay

## 2019-04-07 VITALS — BP 120/76 | HR 102 | Temp 98.6°F | Ht 65.0 in | Wt 111.0 lb

## 2019-04-07 DIAGNOSIS — E538 Deficiency of other specified B group vitamins: Secondary | ICD-10-CM

## 2019-04-07 DIAGNOSIS — E782 Mixed hyperlipidemia: Secondary | ICD-10-CM

## 2019-04-07 DIAGNOSIS — F4323 Adjustment disorder with mixed anxiety and depressed mood: Secondary | ICD-10-CM

## 2019-04-07 DIAGNOSIS — R5383 Other fatigue: Secondary | ICD-10-CM

## 2019-04-07 DIAGNOSIS — F1120 Opioid dependence, uncomplicated: Secondary | ICD-10-CM | POA: Diagnosis not present

## 2019-04-07 LAB — CBC WITH DIFFERENTIAL/PLATELET
Basophils Absolute: 0.1 10*3/uL (ref 0.0–0.1)
Basophils Relative: 0.5 % (ref 0.0–3.0)
Eosinophils Absolute: 0.1 10*3/uL (ref 0.0–0.7)
Eosinophils Relative: 0.7 % (ref 0.0–5.0)
HCT: 44.4 % (ref 36.0–46.0)
Hemoglobin: 15.4 g/dL — ABNORMAL HIGH (ref 12.0–15.0)
Lymphocytes Relative: 26.9 % (ref 12.0–46.0)
Lymphs Abs: 3.1 10*3/uL (ref 0.7–4.0)
MCHC: 34.6 g/dL (ref 30.0–36.0)
MCV: 94.8 fl (ref 78.0–100.0)
Monocytes Absolute: 0.7 10*3/uL (ref 0.1–1.0)
Monocytes Relative: 5.9 % (ref 3.0–12.0)
Neutro Abs: 7.5 10*3/uL (ref 1.4–7.7)
Neutrophils Relative %: 66 % (ref 43.0–77.0)
Platelets: 289 10*3/uL (ref 150.0–400.0)
RBC: 4.68 Mil/uL (ref 3.87–5.11)
RDW: 12.3 % (ref 11.5–15.5)
WBC: 11.4 10*3/uL — ABNORMAL HIGH (ref 4.0–10.5)

## 2019-04-07 LAB — COMPREHENSIVE METABOLIC PANEL
ALT: 15 U/L (ref 0–35)
AST: 18 U/L (ref 0–37)
Albumin: 4.8 g/dL (ref 3.5–5.2)
Alkaline Phosphatase: 92 U/L (ref 39–117)
BUN: 16 mg/dL (ref 6–23)
CO2: 31 mEq/L (ref 19–32)
Calcium: 9.8 mg/dL (ref 8.4–10.5)
Chloride: 101 mEq/L (ref 96–112)
Creatinine, Ser: 0.58 mg/dL (ref 0.40–1.20)
GFR: 103.3 mL/min (ref >60.00)
Glucose, Bld: 101 mg/dL — ABNORMAL HIGH (ref 70–99)
Potassium: 4.6 mEq/L (ref 3.5–5.1)
Sodium: 140 mEq/L (ref 135–145)
Total Bilirubin: 0.5 mg/dL (ref 0.2–1.2)
Total Protein: 7.3 g/dL (ref 6.0–8.3)

## 2019-04-07 LAB — VITAMIN B12: Vitamin B-12: 383 pg/mL (ref 211–911)

## 2019-04-07 LAB — TSH: TSH: 2.44 u[IU]/mL (ref 0.35–4.50)

## 2019-04-07 LAB — MAGNESIUM: Magnesium: 2.1 mg/dL (ref 1.5–2.5)

## 2019-04-07 MED ORDER — OXYCODONE-ACETAMINOPHEN 10-325 MG PO TABS: 1.0000 | ORAL_TABLET | ORAL | 0 refills | Status: DC | PRN

## 2019-04-07 MED ORDER — CYANOCOBALAMIN 1000 MCG/ML IJ SOLN
1000.0000 ug | Freq: Once | INTRAMUSCULAR | Status: AC
  Administered 2019-04-08: 08:00:00 1000 ug via INTRAMUSCULAR

## 2019-04-07 MED ORDER — OXYCODONE-ACETAMINOPHEN 10-325 MG PO TABS: 1.0000 | ORAL_TABLET | ORAL | 0 refills | Status: AC | PRN

## 2019-04-07 NOTE — Progress Notes (Signed)
Patricia Underwood is a 68 y.o. female is here for follow up.  Assessment and Plan:   Uncomplicated opioid dependence (HCC) Patient attempted to wean Norco to manageable level. Pain worsened and QOL did as well. She is interested in pursuing a Suboxone treatment center. Okay medication x 3 months today while the referral is being processed.   Situational mixed anxiety and depressive disorder Communication is improving with her husband. She is hopeful that their marriage will continue to strengthen.   HLD (hyperlipidemia) The 10-year ASCVD risk score Patricia George DC Jr., et al., 2013) is: 6%   Values used to calculate the score:     Age: 63 years     Sex: Female     Is Non-Hispanic African American: No     Diabetic: No     Tobacco smoker: No     Systolic Blood Pressure: 120 mmHg     Is BP treated: No     HDL Cholesterol: 86.2 mg/dL     Total Cholesterol: 202 mg/dL  C95 deficiency Lab Results  Component Value Date   VITAMINB12 383 04/07/2019   Supplementation includes B12 injections at time of visit. No side effects. Will continue current treatment.   Orders Placed This Encounter  Procedures  . CBC with Differential/Platelet  . Comprehensive metabolic panel  . Magnesium  . TSH  . Vitamin B12  . Ambulatory referral to Pain Clinic   Meds ordered this encounter  Medications  . oxyCODONE-acetaminophen (PERCOCET) 10-325 MG tablet    Sig: Take 1 tablet by mouth every 4 (four) hours as needed for pain.    Dispense:  180 tablet    Refill:  0  . oxyCODONE-acetaminophen (PERCOCET) 10-325 MG tablet    Sig: Take 1 tablet by mouth every 4 (four) hours as needed for up to 30 days for pain.    Dispense:  180 tablet    Refill:  0  . oxyCODONE-acetaminophen (PERCOCET) 10-325 MG tablet    Sig: Take 1 tablet by mouth every 4 (four) hours as needed for up to 30 days for pain.    Dispense:  180 tablet    Refill:  0  . cyanocobalamin ((VITAMIN B-12)) injection 1,000 mcg   Health Maintenance:    There are no preventive care reminders to display for this patient. Depression screen [facility] 2/9 09/11/2018 08/28/2017 05/26/2017  Decreased Interest 0 0 0  Down, Depressed, Hopeless 0 0 0  PHQ - 2 Score 0 0 0   PMHx, SurgHx, SocialHx, FamHx, Medications, and Allergies were reviewed in the Visit Navigator and updated as appropriate.   Patient Active Problem List   Diagnosis Date Noted  . Uncomplicated opioid dependence (HCC) 03/22/2019  . Osteopenia 10/25/2018  . Situational mixed anxiety and depressive disorder 09/30/2018  . 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 Fibromyalgia 09/11/2018  . Chronic back pain 09/11/2018  . Gastroesophageal reflux disease without esophagitis 09/11/2018  . 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 Opioid contract exists 04/19/2018  . Acute medial meniscal tear 11/26/2017  . Trigger finger 08/17/2017  . Low vitamin D level 07/05/2017  . B12 deficiency 07/05/2017  . Insomnia 07/05/2017  . Chronic right shoulder pain 05/30/2017  . HLD (hyperlipidemia) 05/30/2017   Social History   Tobacco Use  . Smoking status: Former Games developer  . Smokeless tobacco: Never Used  Substance Use Topics  . Alcohol use: Yes    Alcohol/week: 7.0 standard drinks    Types: 7 Glasses of wine per week  .  Drug use: No   Current Medications and Allergies:   Current Outpatient Medications:  .  albuterol (PROVENTIL HFA;VENTOLIN HFA) 108 (90 Base) MCG/ACT inhaler, INHALE 2 PUFFS INTO THE LUNGS EVERY 6 HOURS AS NEEDED FOR WHEEZING OR FOR SHORTNESS OF BREATH, Disp: 1 Inhaler, Rfl: 3 .  atorvastatin (LIPITOR) 20 MG tablet, , Disp: , Rfl:  .  LORazepam (ATIVAN) 2 MG tablet, Take 1 tablet (2 mg total) by mouth at bedtime., Disp: 90 tablet, Rfl: 1 .  NEOMYCIN-POLYMYXIN-HYDROCORTISONE (CORTISPORIN) 1 % SOLN OTIC solution, Place 3 drops into the right ear every 4 (four) hours., Disp: 10 mL, Rfl: 2 .  triamcinolone (KENALOG) 0.025 % ointment, Apply 1 application topically 2 (two)  times daily., Disp: 30 g, Rfl: 0 .  [START ON 05/07/2019] oxyCODONE-acetaminophen (PERCOCET) 10-325 MG tablet, Take 1 tablet by mouth every 4 (four) hours as needed for pain., Disp: 180 tablet, Rfl: 0 .  [START ON 06/06/2019] oxyCODONE-acetaminophen (PERCOCET) 10-325 MG tablet, Take 1 tablet by mouth every 4 (four) hours as needed for up to 30 days for pain., Disp: 180 tablet, Rfl: 0 .  oxyCODONE-acetaminophen (PERCOCET) 10-325 MG tablet, Take 1 tablet by mouth every 4 (four) hours as needed for up to 30 days for pain., Disp: 180 tablet, Rfl: 0   Allergies  Allergen Reactions  . Ambien [Zolpidem Tartrate]   . Flexeril [Cyclobenzaprine] Other (See Comments)    Patient unsure of reaction  . Naproxen Other (See Comments)    Patient unsure of reaction.   Review of Systems   Pertinent items are noted in the HPI. Otherwise, ROS is negative.  Vitals:   Vitals:   04/07/19 1305  BP: 120/76  Pulse: (!) 102  Temp: 98.6 F (37 C)  TempSrc: Oral  SpO2: 97%  Weight: 111 lb (50.3 kg)  Height: 5\' 5"  (1.651 m)     Body mass index is 18.47 kg/m. Physical Exam:   General: Cooperative, alert and oriented, well developed, well nourished, in no acute distress. HEENT: Pupils equal round reactive light and extraocular movements intact. Conjunctivae and lids unremarkable. No pallor or cyanosis, dentition good. Neck: No thyromegaly.  Cardiovascular: Regular rhythm. No murmurs appreciated.  Lungs: Normal work of breathing. Clear bilaterally without rales, rhonchi, or wheezing.  Abdomen: Soft, nontender, no masses. Normal bowel sounds. Extremities: No clubbing, cyanosis, erythema. No edema.  Skin: Warm and dry. Neurologic: No focal deficits.  Psychiatric: Normal affect and thought content.   . Reviewed expectations re: course of current medical issues. . Discussed self-management of symptoms. . Outlined signs and symptoms indicating need for more acute intervention. . Patient verbalized  understanding and all questions were answered. [name]. Health Maintenance issues including appropriate healthy diet, exercise, and smoking avoidance were discussed with patient. . See orders for this visit as documented in the electronic medical record. . Patient received an After Visit Summary.  Helane RimaErica , DO [place], [facility] 04/08/2019

## 2019-04-08 ENCOUNTER — Encounter: Payer: Self-pay | Admitting: Family Medicine

## 2019-04-08 ENCOUNTER — Ambulatory Visit: Payer: Medicare Other | Admitting: Family Medicine

## 2019-04-08 DIAGNOSIS — E538 Deficiency of other specified B group vitamins: Secondary | ICD-10-CM | POA: Diagnosis not present

## 2019-04-08 NOTE — Assessment & Plan Note (Signed)
The 10-year ASCVD risk score Denman George DC Montez Hageman., et al., 2013) is: 6%   Values used to calculate the score:     Age: 68 years     Sex: Female     Is Non-Hispanic African American: No     Diabetic: No     Tobacco smoker: No     Systolic Blood Pressure: 120 mmHg     Is BP treated: No     HDL Cholesterol: 86.2 mg/dL     Total Cholesterol: 202 mg/dL

## 2019-04-08 NOTE — Assessment & Plan Note (Signed)
Communication is improving with her husband. She is hopeful that their marriage will continue to strengthen.

## 2019-04-08 NOTE — Assessment & Plan Note (Signed)
Lab Results  Component Value Date   VITAMINB12 383 04/07/2019   Supplementation includes B12 injections at time of visit. No side effects. Will continue current treatment.

## 2019-04-08 NOTE — Assessment & Plan Note (Signed)
Patient attempted to wean Norco to manageable level. Pain worsened and QOL did as well. She is interested in pursuing a Suboxone treatment center. Okay medication x 3 months today while the referral is being processed.

## 2019-04-11 ENCOUNTER — Telehealth: Payer: Self-pay | Admitting: Family Medicine

## 2019-04-11 NOTE — Telephone Encounter (Signed)
Addressed in result note.  

## 2019-04-11 NOTE — Telephone Encounter (Signed)
Copied from CRM [id number]. Topic: General - Other >> Apr 11, 2019  1:48 PM Jaquita Rector A wrote: Reason for CRM: Patient request a call back with blood work results. Ph# [phone]

## 2019-04-11 NOTE — Telephone Encounter (Signed)
Please advise results not released.

## 2019-04-12 ENCOUNTER — Telehealth: Payer: Self-pay | Admitting: Family Medicine

## 2019-04-12 ENCOUNTER — Other Ambulatory Visit: Payer: Self-pay

## 2019-04-12 DIAGNOSIS — R899 Unspecified abnormal finding in specimens from other organs, systems and tissues: Secondary | ICD-10-CM

## 2019-04-12 NOTE — Progress Notes (Signed)
c 

## 2019-04-12 NOTE — Telephone Encounter (Signed)
Called pt and left vm to schedule

## 2019-04-12 NOTE — Telephone Encounter (Signed)
Pt has called twice about message below. Please advise.    Copied from CRM [id number]. Topic: General - Inquiry >> Apr 12, 2019 10:55 AM Patricia Underwood wrote: Reason for CRM: Patient would like to reschedule lab appt for either 6/4-6/5 and also make an appt on Tuesday 6/9 to talk about in person about her lab work.  Line was busy sent CRM.  Patient call back # [phone]

## 2019-04-12 NOTE — Telephone Encounter (Signed)
That's fine can you call? Let patient know that we will do b-12 at office visit

## 2019-04-12 NOTE — Telephone Encounter (Signed)
Patient called back and is worried about her lab results and wants to be worked in sometime this week to talk to Dr Earlene Plater about her result.  Please Advise. Patient would like a call back at [phone]

## 2019-04-12 NOTE — Telephone Encounter (Signed)
Called see lab note for documentation.

## 2019-04-15 ENCOUNTER — Other Ambulatory Visit: Payer: Self-pay

## 2019-04-15 ENCOUNTER — Telehealth: Payer: Self-pay | Admitting: Family Medicine

## 2019-04-15 ENCOUNTER — Other Ambulatory Visit (INDEPENDENT_AMBULATORY_CARE_PROVIDER_SITE_OTHER): Payer: Medicare Other

## 2019-04-15 DIAGNOSIS — R899 Unspecified abnormal finding in specimens from other organs, systems and tissues: Secondary | ICD-10-CM

## 2019-04-15 LAB — CBC WITH DIFFERENTIAL/PLATELET
Basophils Absolute: 0 10*3/uL (ref 0.0–0.1)
Basophils Relative: 0.5 % (ref 0.0–3.0)
Eosinophils Absolute: 0.2 10*3/uL (ref 0.0–0.7)
Eosinophils Relative: 1.9 % (ref 0.0–5.0)
HCT: 42.6 % (ref 36.0–46.0)
Hemoglobin: 14.4 g/dL (ref 12.0–15.0)
Lymphocytes Relative: 35.9 % (ref 12.0–46.0)
Lymphs Abs: 3.4 10*3/uL (ref 0.7–4.0)
MCHC: 33.8 g/dL (ref 30.0–36.0)
MCV: 95.5 fl (ref 78.0–100.0)
Monocytes Absolute: 0.7 10*3/uL (ref 0.1–1.0)
Monocytes Relative: 7.4 % (ref 3.0–12.0)
Neutro Abs: 5.2 10*3/uL (ref 1.4–7.7)
Neutrophils Relative %: 54.3 % (ref 43.0–77.0)
Platelets: 328 10*3/uL (ref 150.0–400.0)
RBC: 4.46 Mil/uL (ref 3.87–5.11)
RDW: 12.3 % (ref 11.5–15.5)
WBC: 9.5 10*3/uL (ref 4.0–10.5)

## 2019-04-15 NOTE — Telephone Encounter (Signed)
Patient called again to speak with JoEllen. I spoke with the patient and advised that I actually needed to screen her upcoming visit to ensure no COVID SYMPTOMS OR CONTACT which she did not have and I have inputted that information in her appointment notes. Patient stated she did not listen to her voicemail, I advised her to do so and I would send a message to Dr. Philis Pique team to advise if there was anything additional.

## 2019-04-15 NOTE — Telephone Encounter (Signed)
Patient would like a call back from Counce. Patient said she missed 2 calls from her.

## 2019-04-19 ENCOUNTER — Other Ambulatory Visit: Payer: Self-pay

## 2019-04-19 ENCOUNTER — Ambulatory Visit (INDEPENDENT_AMBULATORY_CARE_PROVIDER_SITE_OTHER): Payer: Medicare Other | Admitting: Family Medicine

## 2019-04-19 ENCOUNTER — Other Ambulatory Visit: Payer: Medicare Other

## 2019-04-19 ENCOUNTER — Encounter: Payer: Self-pay | Admitting: Family Medicine

## 2019-04-19 VITALS — BP 124/74 | HR 85 | Temp 98.6°F | Ht 65.0 in | Wt 109.6 lb

## 2019-04-19 DIAGNOSIS — W010XXA Fall on same level from slipping, tripping and stumbling without subsequent striking against object, initial encounter: Secondary | ICD-10-CM

## 2019-04-19 DIAGNOSIS — F1120 Opioid dependence, uncomplicated: Secondary | ICD-10-CM | POA: Diagnosis not present

## 2019-04-19 DIAGNOSIS — Z79891 Long term (current) use of opiate analgesic: Secondary | ICD-10-CM

## 2019-04-19 DIAGNOSIS — M3500 Sicca syndrome, unspecified: Secondary | ICD-10-CM

## 2019-04-19 DIAGNOSIS — Z20828 Contact with and (suspected) exposure to other viral communicable diseases: Secondary | ICD-10-CM | POA: Diagnosis not present

## 2019-04-19 DIAGNOSIS — G894 Chronic pain syndrome: Secondary | ICD-10-CM | POA: Diagnosis not present

## 2019-04-19 DIAGNOSIS — R634 Abnormal weight loss: Secondary | ICD-10-CM

## 2019-04-19 DIAGNOSIS — R262 Difficulty in walking, not elsewhere classified: Secondary | ICD-10-CM

## 2019-04-19 DIAGNOSIS — E538 Deficiency of other specified B group vitamins: Secondary | ICD-10-CM

## 2019-04-19 DIAGNOSIS — E782 Mixed hyperlipidemia: Secondary | ICD-10-CM

## 2019-04-19 DIAGNOSIS — R5383 Other fatigue: Secondary | ICD-10-CM | POA: Diagnosis not present

## 2019-04-19 DIAGNOSIS — K029 Dental caries, unspecified: Secondary | ICD-10-CM

## 2019-04-19 DIAGNOSIS — R4702 Dysphasia: Secondary | ICD-10-CM

## 2019-04-19 LAB — C-REACTIVE PROTEIN: CRP: <1 mg/dL (ref 0.5–20.0)

## 2019-04-19 LAB — SEDIMENTATION RATE: Sed Rate: 4 mm/hr (ref 0–30)

## 2019-04-19 MED ORDER — OXYCODONE HCL 10 MG PO TABS: 10.0000 mg | ORAL_TABLET | ORAL | 0 refills | Status: DC

## 2019-04-19 MED ORDER — CYANOCOBALAMIN 1000 MCG/ML IJ SOLN
1000.0000 ug | Freq: Once | INTRAMUSCULAR | Status: AC
  Administered 2019-04-19: 16:00:00 1000 ug via INTRAMUSCULAR

## 2019-04-19 MED ORDER — CYANOCOBALAMIN 1000 MCG/ML IJ SOLN: INTRAMUSCULAR | 0 refills | Status: DC

## 2019-04-19 MED ORDER — "SYRINGE 25G X 1"" 3 ML MISC": 1.0000 "application " | 0 refills | Status: DC

## 2019-04-19 NOTE — Progress Notes (Signed)
Patricia Underwood is a 68 y.o. female is here for follow up.  Assessment and Plan:   Patricia Underwood was seen today for follow-up.  Diagnoses and all orders for this visit:  Chronic pain syndrome Comments: Working on getting patient to Suboxone clinic. See orders. Will work on non-opioid pain control. Orders: -     Oxycodone HCl 10 MG TABS; Take 1 tablet (10 mg total) by mouth every 4 (four) hours. -     Oxycodone HCl 10 MG TABS; Take 1 tablet (10 mg total) by mouth every 4 (four) hours. -     Oxycodone HCl 10 MG TABS; Take 1 tablet (10 mg total) by mouth every 4 (four) hours.  Exposure to SARS virus -     SAR CoV2 Serology (COVID 19)AB(IGG)IA  Uncomplicated opioid dependence (HCC)  B12 deficiency Comments: Reviewed home B12 injections. Teaching completed.  Orders: -     Syringe/Needle, Disp, (SYRINGE 3CC/25GX1") 25G X 1" 3 ML MISC; 1 application by Does not apply route once a week. -     cyanocobalamin ((VITAMIN B-12)) injection 1,000 mcg -     cyanocobalamin (,VITAMIN B-12,) 1000 MCG/ML injection; 1000 mcg (1 mg) injection once every other week  Mixed hyperlipidemia  Opioid contract exists  Sicca syndrome, unspecified (HCC) Comments: Hx of Sjogrens Dx per patient that was removed by another physician. Add labs today. Orders: -     Sedimentation rate -     C-reactive protein -     Sjogrens syndrome-A extractable nuclear antibody -     Sjogrens syndrome-B extractable nuclear antibody  Dysphasia Comments: Versus odynophasia. Feels that something is getting stuck in her throat when she eats. To GI. Orders: -     Ambulatory referral to Gastroenterology  Fall on same level from slipping, tripping or stumbling, initial encounter Comments: Fell while walking dog - leash wrapped around her legs. Healing abrasions on legs. Needs to restart PT.  Dental caries  Weight loss Comments: From dental carries. Discussed increasing protein.  Difficulty walking -     Ambulatory referral to  Physical Therapy   Orders Placed This Encounter  Procedures  . SAR CoV2 Serology (COVID 19)AB(IGG)IA  . Sedimentation rate  . C-reactive protein  . Sjogrens syndrome-A extractable nuclear antibody  . Sjogrens syndrome-B extractable nuclear antibody  . Ambulatory referral to Gastroenterology  . Ambulatory referral to Physical Therapy   Meds ordered this encounter  Medications  . Oxycodone HCl 10 MG TABS    Sig: Take 1 tablet (10 mg total) by mouth every 4 (four) hours.    Dispense:  180 tablet    Refill:  0  . Oxycodone HCl 10 MG TABS    Sig: Take 1 tablet (10 mg total) by mouth every 4 (four) hours.    Dispense:  180 tablet    Refill:  0  . Oxycodone HCl 10 MG TABS    Sig: Take 1 tablet (10 mg total) by mouth every 4 (four) hours.    Dispense:  180 tablet    Refill:  0  . Syringe/Needle, Disp, (SYRINGE 3CC/25GX1") 25G X 1" 3 ML MISC    Sig: 1 application by Does not apply route once a week.    Dispense:  12 each    Refill:  0  . cyanocobalamin ((VITAMIN B-12)) injection 1,000 mcg  . cyanocobalamin (,VITAMIN B-12,) 1000 MCG/ML injection    Sig: 1000 mcg (1 mg) injection once every other week    Dispense:  6 mL  Refill:  0   Health Maintenance:  There are no preventive care reminders to display for this patient. Depression screen [facility] 2/9 04/19/2019 04/19/2019 09/11/2018  Decreased Interest 0 0 0  Down, Depressed, Hopeless 0 2 0  PHQ - 2 Score 0 2 0  Altered sleeping 0 2 -  Tired, decreased energy 0 3 -  Change in appetite 0 3 -  Feeling bad or failure about yourself  0 0 -  Trouble concentrating 0 3 -  Moving slowly or fidgety/restless 0 0 -  Suicidal thoughts 0 0 -  PHQ-9 Score 0 13 -  Difficult doing work/chores Not difficult at all Not difficult at all -   PMHx, SurgHx, SocialHx, FamHx, Medications, and Allergies were reviewed in the Visit Navigator and updated as appropriate.   Patient Active Problem List   Diagnosis Date Noted  . Dysphasia 04/21/2019  . Sicca  syndrome, unspecified ([place]) 04/21/2019  . Dental caries 04/21/2019  . Fall on same level from slipping, tripping, or stumbling 04/21/2019  . Weight loss 04/21/2019  . Uncomplicated opioid dependence ([place]) 03/22/2019  . Osteopenia 10/25/2018  . Situational mixed anxiety and depressive disorder 09/30/2018  . 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 Fibromyalgia 09/11/2018  . Chronic back pain 09/11/2018  . Gastroesophageal reflux disease without esophagitis 09/11/2018  . 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 Opioid contract exists 04/19/2018  . Acute medial meniscal tear 11/26/2017  . Trigger finger 08/17/2017  . Low vitamin D level 07/05/2017  . B12 deficiency 07/05/2017  . Insomnia 07/05/2017  . Chronic right shoulder pain 05/30/2017  . HLD (hyperlipidemia) 05/30/2017   Social History   Tobacco Use  . Smoking status: Former Research scientist (life sciences)  . Smokeless tobacco: Never Used  Substance Use Topics  . Alcohol use: Yes    Alcohol/week: 7.0 standard drinks    Types: 7 Glasses of wine per week  . Drug use: No   Current Medications and Allergies:   .  albuterol (PROVENTIL HFA;VENTOLIN HFA) 108 (90 Base) MCG/ACT inhaler, INHALE 2 PUFFS INTO THE LUNGS EVERY 6 HOURS AS NEEDED FOR WHEEZING OR FOR SHORTNESS OF BREATH, Disp: 1 Inhaler, Rfl: 3 .  LORazepam (ATIVAN) 2 MG tablet, Take 1 tablet (2 mg total) by mouth at bedtime., Disp: 90 tablet, Rfl: 1 .  oxyCODONE-acetaminophen (PERCOCET) 10-325 MG tablet, Take 1 tablet by mouth every 4 (four) hours as needed for up to 30 days for pain., Disp: 180 tablet, Rfl: 0 .  atorvastatin (LIPITOR) 20 MG tablet, , Disp: , Rfl:  .  cyanocobalamin (,VITAMIN B-12,) 1000 MCG/ML injection, 1000 mcg (1 mg) injection once every other week, Disp: 6 mL, Rfl: 0   Allergies  Allergen Reactions  . Ambien [Zolpidem Tartrate]   . Flexeril [Cyclobenzaprine] Other (See Comments)    Patient unsure of reaction  . Naproxen Other (See Comments)    Patient unsure  of reaction.   Review of Systems   Pertinent items are noted in the HPI. Otherwise, ROS is negative.  Vitals:   Vitals:   04/19/19 1315  BP: 124/74  Pulse: 85  Temp: 98.6 F (37 C)  TempSrc: Oral  SpO2: 97%  Weight: 109 lb 9.6 oz (49.7 kg)  Height: 5\' 5"  (1.651 m)     Body mass index is 18.24 kg/m.   Physical Exam:   General: Cooperative, alert and oriented, well developed, well nourished, in no acute distress. HEENT: Pupils equal round reactive light and extraocular movements intact. Conjunctivae and lids  unremarkable. No pallor or cyanosis, dentition good. Neck: No thyromegaly.  Cardiovascular: Regular rhythm. No murmurs appreciated.  Lungs: Normal work of breathing. Clear bilaterally without rales, rhonchi, or wheezing.  Abdomen: Soft, nontender, no masses. Normal bowel sounds. Extremities: No clubbing, cyanosis, erythema. No edema.  Skin: Warm and dry. Neurologic: No focal deficits.  Psychiatric: Normal affect and thought content.   . Reviewed expectations re: course of current medical issues. . Discussed self-management of symptoms. . Outlined signs and symptoms indicating need for more acute intervention. . Patient verbalized understanding and all questions were answered. [name] Health Maintenance issues including appropriate healthy diet, exercise, and smoking avoidance were discussed with patient. . See orders for this visit as documented in the electronic medical record. . Patient received an After Visit Summary.  Helane Rima, DO [place], [facility] 05/04/2019

## 2019-04-20 ENCOUNTER — Encounter: Payer: Self-pay | Admitting: Family Medicine

## 2019-04-20 LAB — SJOGRENS SYNDROME-B EXTRACTABLE NUCLEAR ANTIBODY: SSB (La) (ENA) Antibody, IgG: <1 AI

## 2019-04-20 LAB — SJOGRENS SYNDROME-A EXTRACTABLE NUCLEAR ANTIBODY: SSA (Ro) (ENA) Antibody, IgG: <1 AI

## 2019-04-20 LAB — SAR COV2 SEROLOGY (COVID19)AB(IGG),IA: SARS CoV2 AB IGG: NEGATIVE

## 2019-04-21 DIAGNOSIS — R634 Abnormal weight loss: Secondary | ICD-10-CM | POA: Insufficient documentation

## 2019-04-21 DIAGNOSIS — R4702 Dysphasia: Secondary | ICD-10-CM | POA: Insufficient documentation

## 2019-04-21 DIAGNOSIS — K029 Dental caries, unspecified: Secondary | ICD-10-CM | POA: Insufficient documentation

## 2019-04-21 DIAGNOSIS — M3500 Sicca syndrome, unspecified: Secondary | ICD-10-CM | POA: Insufficient documentation

## 2019-04-21 DIAGNOSIS — W010XXA Fall on same level from slipping, tripping and stumbling without subsequent striking against object, initial encounter: Secondary | ICD-10-CM | POA: Insufficient documentation

## 2019-04-21 NOTE — Assessment & Plan Note (Signed)
Dog leash wrapped around her legs and she fell forward. No head trauma.

## 2019-04-21 NOTE — Assessment & Plan Note (Signed)
Wt Readings from Last 3 Encounters:  04/19/19 109 lb 9.6 oz (49.7 kg)  04/07/19 111 lb (50.3 kg)  03/22/19 114 lb (51.7 kg)   Patient states that she has been "too congested" to eat. Describes a sensation in her throat, like something is caught.

## 2019-05-04 NOTE — Assessment & Plan Note (Signed)
Still wanting to pursue Suboxone clinic. Will help with referral. Will continue with current management until that time. Will maximize non-opioid pain management.

## 2019-05-06 ENCOUNTER — Ambulatory Visit: Payer: Self-pay | Admitting: Family Medicine

## 2019-05-06 NOTE — Telephone Encounter (Signed)
Called patient reviewed all questions she had about type of test daughter should get before she leaves. Told her to call PCP where located all are about the same depends on that hey have available. Answered all questions and will call if any other issues.

## 2019-05-06 NOTE — Telephone Encounter (Signed)
Pt. Reports her daughter is coming from [place] next week to visit and wants to be tested before she comes here.Asking what kind of test she should have to check for COVID 19. I instructed pt. To have her daughter contact her PCP and talk about COVID 19 test. States she wants to talk to Dr. Juleen China about this.Please advise pt.

## 2019-05-16 ENCOUNTER — Telehealth: Payer: Self-pay | Admitting: Family Medicine

## 2019-05-16 MED ORDER — OXYCODONE HCL 10 MG PO TABS: 10.0000 mg | ORAL_TABLET | ORAL | 0 refills | Status: AC | PRN

## 2019-05-16 NOTE — Telephone Encounter (Signed)
I called in a 5 day script for her to pick up now.

## 2019-05-16 NOTE — Addendum Note (Signed)
Addended by: Briscoe Deutscher R on: 05/16/2019 04:08 PM   Modules accepted: Orders

## 2019-05-16 NOTE — Telephone Encounter (Signed)
Patient notified

## 2019-05-16 NOTE — Telephone Encounter (Signed)
Please advise we have changes so much not sure what/how she is taking

## 2019-05-16 NOTE — Telephone Encounter (Signed)
Pt called in reference to her rx for Oxycodone HCl 10 MG TABS. Pt stated that her daughter is in town and they were going out of town to visit a family member before her daughter leaves this Saturday. Pt stated her rx stated can not be filled until 7/9. Pt was told to call Dr. Juleen China and see if it can be filled sooner for her to go out of town. Please advise. Pt wants call back.

## 2019-05-25 ENCOUNTER — Ambulatory Visit: Payer: Medicare Other | Admitting: Family Medicine

## 2019-05-26 ENCOUNTER — Encounter: Payer: Self-pay | Admitting: Family Medicine

## 2019-05-30 NOTE — Progress Notes (Signed)
Patricia Underwood is a 68 y.o. female is here for follow up.  History of Present Illness:   HPI: Patient would like to stop Norco. Looked into Suboxone clinic and feels that it is not a good choice for her.  She wants to discuss tapering the Norco.  No new symptoms.  Daughter is in town and has been a great help to her and her husband.  There are no preventive care reminders to display for this patient. Depression screen [facility] 2/9 04/19/2019 04/19/2019 09/11/2018  Decreased Interest 0 0 0  Down, Depressed, Hopeless 0 2 0  PHQ - 2 Score 0 2 0  Altered sleeping 0 2 -  Tired, decreased energy 0 3 -  Change in appetite 0 3 -  Feeling bad or failure about yourself  0 0 -  Trouble concentrating 0 3 -  Moving slowly or fidgety/restless 0 0 -  Suicidal thoughts 0 0 -  PHQ-9 Score 0 13 -  Difficult doing work/chores Not difficult at all Not difficult at all -   PMHx, SurgHx, SocialHx, FamHx, Medications, and Allergies were reviewed in the Visit Navigator and updated as appropriate.   Patient Active Problem List   Diagnosis Date Noted  . Dysphasia 04/21/2019  . Sicca syndrome, unspecified (HCC) 04/21/2019  . Dental caries 04/21/2019  . Fall on same level from slipping, tripping, or stumbling 04/21/2019  . Weight loss 04/21/2019  . Osteopenia 10/25/2018  . Situational mixed anxiety and depressive disorder 09/30/2018  . 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 Fibromyalgia 09/11/2018  . Chronic back pain 09/11/2018  . Gastroesophageal reflux disease without esophagitis 09/11/2018  . 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 Opioid contract exists 04/19/2018  . Acute medial meniscal tear 11/26/2017  . Trigger finger 08/17/2017  . Low vitamin D level 07/05/2017  . B12 deficiency 07/05/2017  . Insomnia 07/05/2017  . Chronic right shoulder pain 05/30/2017  . HLD (hyperlipidemia) 05/30/2017   Social History   Tobacco Use  . Smoking status: Former Games developer  . Smokeless tobacco: Never  Used  Substance Use Topics  . Alcohol use: Yes    Alcohol/week: 7.0 standard drinks    Types: 7 Glasses of wine per week  . Drug use: No   Current Medications and Allergies   Current Outpatient Medications:  .  albuterol (PROVENTIL HFA;VENTOLIN HFA) 108 (90 Base) MCG/ACT inhaler, INHALE 2 PUFFS INTO THE LUNGS EVERY 6 HOURS AS NEEDED FOR WHEEZING OR FOR SHORTNESS OF BREATH, Disp: 1 Inhaler, Rfl: 3 .  atorvastatin (LIPITOR) 20 MG tablet, , Disp: , Rfl:  .  cyanocobalamin (,VITAMIN B-12,) 1000 MCG/ML injection, 1000 mcg (1 mg) injection once every other week, Disp: 6 mL, Rfl: 0 .  LORazepam (ATIVAN) 2 MG tablet, Take 1 tablet (2 mg total) by mouth at bedtime., Disp: 90 tablet, Rfl: 1 .  [START ON 06/18/2019] Oxycodone HCl 10 MG TABS, Take 1 tablet (10 mg total) by mouth every 4 (four) hours., Disp: 180 tablet, Rfl: 0 .  Syringe/Needle, Disp, (SYRINGE 3CC/25GX1") 25G X 1" 3 ML MISC, 1 application by Does not apply route once a week., Disp: 12 each, Rfl: 0   Allergies  Allergen Reactions  . Ambien [Zolpidem Tartrate]   . Flexeril [Cyclobenzaprine] Other (See Comments)    Patient unsure of reaction  . Naproxen Other (See Comments)    Patient unsure of reaction.   Review of Systems   Pertinent items are noted in the HPI. Otherwise, a complete ROS  is negative.  Vitals   Vitals:   05/31/19 0943  BP: 140/84  Pulse: 85  SpO2: 98%  Weight: 107 lb 6.1 oz (48.7 kg)  Height: 5\' 4"  (1.626 m)     Body mass index is 18.43 kg/m.  Physical Exam   Physical Exam Vitals signs and nursing note reviewed.  HENT:     Head: Normocephalic and atraumatic.  Eyes:     Pupils: Pupils are equal, round, and reactive to light.  Neck:     Musculoskeletal: Normal range of motion and neck supple.  Cardiovascular:     Rate and Rhythm: Normal rate and regular rhythm.     Heart sounds: Normal heart sounds.  Pulmonary:     Effort: Pulmonary effort is normal.  Abdominal:     Palpations: Abdomen is soft.   Skin:    General: Skin is warm.  Psychiatric:        Behavior: Behavior normal.    Assessment and Plan   [name] was seen today for follow-up.  Diagnoses and all orders for this visit:  Chronic back pain, unspecified back location, unspecified back pain laterality  We reviewed safe ways to slowly decrease the Norco.  I will look into other Suboxone clinics.  We discussed other management of chronic pain.  . Orders and follow up as documented in [place], reviewed diet, exercise and weight control, cardiovascular risk and specific lipid/LDL goals reviewed, reviewed medications and side effects in detail.  . Reviewed expectations re: course of current medical issues. . Outlined signs and symptoms indicating need for more acute intervention. . Patient verbalized understanding and all questions were answered. . Patient received an After Visit Summary.  Briscoe Deutscher, DO [place], [facility] 06/10/2019

## 2019-05-31 ENCOUNTER — Other Ambulatory Visit: Payer: Self-pay

## 2019-05-31 ENCOUNTER — Ambulatory Visit (INDEPENDENT_AMBULATORY_CARE_PROVIDER_SITE_OTHER): Payer: Medicare Other | Admitting: Family Medicine

## 2019-05-31 ENCOUNTER — Encounter: Payer: Self-pay | Admitting: Family Medicine

## 2019-05-31 VITALS — BP 140/84 | HR 85 | Ht 64.0 in | Wt 107.4 lb

## 2019-05-31 DIAGNOSIS — G8929 Other chronic pain: Secondary | ICD-10-CM | POA: Diagnosis not present

## 2019-05-31 DIAGNOSIS — M549 Dorsalgia, unspecified: Secondary | ICD-10-CM | POA: Diagnosis not present

## 2019-06-08 ENCOUNTER — Telehealth: Payer: Self-pay | Admitting: Family Medicine

## 2019-06-08 NOTE — Telephone Encounter (Signed)
Called answered all questions will call if any changes

## 2019-06-08 NOTE — Telephone Encounter (Signed)
Patient called in she [facility]. She said she very nervous about and would like to Dr. Juleen China about it.

## 2019-06-09 DIAGNOSIS — G890 Central pain syndrome: Secondary | ICD-10-CM | POA: Diagnosis not present

## 2019-06-09 DIAGNOSIS — M25511 Pain in right shoulder: Secondary | ICD-10-CM | POA: Diagnosis not present

## 2019-06-09 DIAGNOSIS — M545 Low back pain: Secondary | ICD-10-CM | POA: Diagnosis not present

## 2019-06-09 DIAGNOSIS — M62830 Muscle spasm of back: Secondary | ICD-10-CM | POA: Diagnosis not present

## 2019-06-15 ENCOUNTER — Telehealth: Payer: Self-pay

## 2019-06-15 NOTE — Telephone Encounter (Signed)
Pt wants to come IN OFFICE for visit with Juleen China prior to her appt scheduled on 8/19. I informed her that wallace was doing doxy only starting next week. Pt was persistent on being seen in office. Please advise.

## 2019-06-15 NOTE — Telephone Encounter (Signed)
Called patient she needs new app with new office that you and I had discussed. She would like referral ASAP. She did get script from pain clinic for one month but would like to get in before that runs out.

## 2019-06-16 NOTE — Telephone Encounter (Signed)
Okay for the urgent referral.

## 2019-06-17 NOTE — Telephone Encounter (Signed)
Pt called back requesting a call back about referral. Pt wanted to move her appt up to 8/12 at 840. Pt wants in office. Please call pt.

## 2019-06-21 NOTE — Telephone Encounter (Signed)
Called [employer] gave patient information they call patient to make app. Assure that if she is a agreement with plan they will be able to get in a few days. Called patient let her know that she should be expecting call.

## 2019-06-22 ENCOUNTER — Other Ambulatory Visit: Payer: Self-pay

## 2019-06-22 ENCOUNTER — Encounter: Payer: Self-pay | Admitting: Family Medicine

## 2019-06-22 ENCOUNTER — Ambulatory Visit (INDEPENDENT_AMBULATORY_CARE_PROVIDER_SITE_OTHER): Payer: Medicare Other | Admitting: Family Medicine

## 2019-06-22 VITALS — BP 130/82 | HR 79 | Temp 98.6°F | Ht 64.0 in | Wt 108.6 lb

## 2019-06-22 DIAGNOSIS — E538 Deficiency of other specified B group vitamins: Secondary | ICD-10-CM | POA: Diagnosis not present

## 2019-06-22 DIAGNOSIS — Z79899 Other long term (current) drug therapy: Secondary | ICD-10-CM

## 2019-06-22 DIAGNOSIS — G894 Chronic pain syndrome: Secondary | ICD-10-CM

## 2019-06-22 MED ORDER — OXYCODONE HCL 10 MG PO TABS: 10.0000 mg | ORAL_TABLET | ORAL | 0 refills | Status: DC

## 2019-06-22 MED ORDER — CYANOCOBALAMIN 1000 MCG/ML IJ SOLN: INTRAMUSCULAR | 0 refills | Status: DC

## 2019-06-22 MED ORDER — "SYRINGE 25G X 1"" 3 ML MISC": 1.0000 "application " | 0 refills | Status: DC

## 2019-06-22 NOTE — Progress Notes (Signed)
Patricia Underwood is a 68 y.o. female is here for follow up.  History of Present Illness:   HPI: See Assessment and Plan section for Problem Based Charting of issues discussed today.   There are no preventive care reminders to display for this patient.   Depression screen [facility] 2/9 04/19/2019 04/19/2019 09/11/2018  Decreased Interest 0 0 0  Down, Depressed, Hopeless 0 2 0  PHQ - 2 Score 0 2 0  Altered sleeping 0 2 -  Tired, decreased energy 0 3 -  Change in appetite 0 3 -  Feeling bad or failure about yourself  0 0 -  Trouble concentrating 0 3 -  Moving slowly or fidgety/restless 0 0 -  Suicidal thoughts 0 0 -  PHQ-9 Score 0 13 -  Difficult doing work/chores Not difficult at all Not difficult at all -   PMHx, SurgHx, SocialHx, FamHx, Medications, and Allergies were reviewed in the Visit Navigator and updated as appropriate.   Patient Active Problem List   Diagnosis Date Noted  . Chronic pain syndrome 07/04/2019  . Dysphasia 04/21/2019  . Sicca syndrome, unspecified (HCC) 04/21/2019  . Dental caries 04/21/2019  . Fall on same level from slipping, tripping, or stumbling 04/21/2019  . Weight loss 04/21/2019  . Osteopenia 10/25/2018  . Situational mixed anxiety and depressive disorder 09/30/2018  . 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 Fibromyalgia 09/11/2018  . Chronic back pain 09/11/2018  . Gastroesophageal reflux disease without esophagitis 09/11/2018  . 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 Opioid contract exists 04/19/2018  . Acute medial meniscal tear 11/26/2017  . Trigger finger 08/17/2017  . Low vitamin D level 07/05/2017  . B12 deficiency 07/05/2017  . Insomnia 07/05/2017  . Chronic right shoulder pain 05/30/2017  . HLD (hyperlipidemia) 05/30/2017   Social History   Tobacco Use  . Smoking status: Former Games developer  . Smokeless tobacco: Never Used  Substance Use Topics  . Alcohol use: Yes    Alcohol/week: 7.0 standard drinks    Types: 7 Glasses of wine  per week  . Drug use: No   Current Medications and Allergies   .  albuterol (PROVENTIL HFA;VENTOLIN HFA) 108 (90 Base) MCG/ACT inhaler, INHALE 2 PUFFS INTO THE LUNGS EVERY 6 HOURS AS NEEDED FOR WHEEZING OR FOR SHORTNESS OF BREATH, Disp: 1 Inhaler, Rfl: 3 .  atorvastatin (LIPITOR) 20 MG tablet, , Disp: , Rfl:  .  cyanocobalamin (,VITAMIN B-12,) 1000 MCG/ML injection, 1000 mcg (1 mg) injection once every other week, Disp: 6 mL, Rfl: 0 .  LORazepam (ATIVAN) 2 MG tablet, Take 1 tablet (2 mg total) by mouth at bedtime., Disp: 90 tablet, Rfl: 1 .  Oxycodone HCl 10 MG TABS, Take 1 tablet (10 mg total) by mouth every 4 (four) hours., Disp: 180 tablet, Rfl: 0 .  Syringe/Needle, Disp, (SYRINGE 3CC/25GX1") 25G X 1" 3 ML MISC, 1 application by Does not apply route once a week., Disp: 12 each, Rfl: 0   Allergies  Allergen Reactions  . Ambien [Zolpidem Tartrate]   . Flexeril [Cyclobenzaprine] Other (See Comments)    Patient unsure of reaction  . Naproxen Other (See Comments)    Patient unsure of reaction.   Review of Systems   Pertinent items are noted in the HPI. Otherwise, a complete ROS is negative.  Vitals   Vitals:   06/22/19 0838  BP: 130/82  Pulse: 79  Temp: 98.6 F (37 C)  TempSrc: Oral  SpO2: 97%  Weight: 108 lb 9.6  oz (49.3 kg)  Height: 5\' 4"  (1.626 m)     Body mass index is 18.64 kg/m.  Physical Exam   Physical Exam Vitals signs and nursing note reviewed.  HENT:     Head: Normocephalic and atraumatic.  Eyes:     Pupils: Pupils are equal, round, and reactive to light.  Cardiovascular:     Rate and Rhythm: Normal rate and regular rhythm.     Heart sounds: Normal heart sounds.  Pulmonary:     Effort: Pulmonary effort is normal.  Abdominal:     Palpations: Abdomen is soft.  Skin:    General: Skin is warm.  Psychiatric:        Behavior: Behavior normal.    Results for orders placed or performed in visit on 04/19/19  SAR CoV2 Serology (COVID 19)AB(IGG)IA  Result  Value Ref Range   SARS CoV2 AB IGG NEGATIVE   Sedimentation rate  Result Value Ref Range   Sed Rate 4 0 - 30 mm/hr  C-reactive protein  Result Value Ref Range   CRP <1.0 0.5 - 20.0 mg/dL  Sjogrens syndrome-A extractable nuclear antibody  Result Value Ref Range   SSA (Ro) (ENA) Antibody, IgG <1.0 NEG <1.0 NEG AI  Sjogrens syndrome-B extractable nuclear antibody  Result Value Ref Range   SSB (La) (ENA) Antibody, IgG <1.0 NEG <1.0 NEG AI   Assessment and Plan   [name] was seen today for follow-up.  Diagnoses and all orders for this visit:  Chronic pain syndrome Comments: [facility] at [facility]. Robert Bellow go back to previous treatment while working on getting her to another clinic.  Orders: -     Oxycodone HCl 10 MG TABS; Take 1 tablet (10 mg total) by mouth every 4 (four) hours.  B12 deficiency Comments: Reviewed home B12 injections. Teaching completed.  Orders: -     cyanocobalamin (,VITAMIN B-12,) 1000 MCG/ML injection; 1000 mcg (1 mg) injection once every other week -     Syringe/Needle, Disp, (SYRINGE 3CC/25GX1") 25G X 1" 3 ML MISC; 1 application by Does not apply route once a week.  Medication management -     CBC with Differential/Platelet -     Comprehensive metabolic panel   . Orders and follow up as documented in [place], reviewed diet, exercise and weight control, cardiovascular risk and specific lipid/LDL goals reviewed, reviewed medications and side effects in detail.  . Reviewed expectations re: course of current medical issues. . Outlined signs and symptoms indicating need for more acute intervention. . Patient verbalized understanding and all questions were answered. . Patient received an After Visit Summary.  Briscoe Deutscher, DO [place] 07/04/2019

## 2019-06-29 ENCOUNTER — Ambulatory Visit: Payer: Medicare Other | Admitting: Family Medicine

## 2019-07-04 DIAGNOSIS — G894 Chronic pain syndrome: Secondary | ICD-10-CM | POA: Insufficient documentation

## 2019-07-13 ENCOUNTER — Telehealth: Payer: Self-pay | Admitting: Family Medicine

## 2019-07-13 ENCOUNTER — Ambulatory Visit (INDEPENDENT_AMBULATORY_CARE_PROVIDER_SITE_OTHER): Payer: Medicare Other | Admitting: Physician Assistant

## 2019-07-13 ENCOUNTER — Encounter: Payer: Self-pay | Admitting: Physician Assistant

## 2019-07-13 ENCOUNTER — Ambulatory Visit: Payer: Medicare Other | Admitting: Neurology

## 2019-07-13 VITALS — Ht 64.0 in | Wt 110.0 lb

## 2019-07-13 DIAGNOSIS — S70362A Insect bite (nonvenomous), left thigh, initial encounter: Secondary | ICD-10-CM

## 2019-07-13 DIAGNOSIS — W57XXXA Bitten or stung by nonvenomous insect and other nonvenomous arthropods, initial encounter: Secondary | ICD-10-CM | POA: Diagnosis not present

## 2019-07-13 MED ORDER — DOXYCYCLINE HYCLATE 100 MG PO TABS: 100.0000 mg | ORAL_TABLET | Freq: Two times a day (BID) | ORAL | 0 refills | Status: DC

## 2019-07-13 NOTE — Telephone Encounter (Signed)
Patient called [facility] and refused to speak with them per [facility]. I spoke with the patient and she said that she pulled 2 ticks off her body yesterday and was concerned about Lymes Disease. She stated she wanted doxycycline. I advised that she needed to get scheduled for an appointment either virtual or in office to get the medication she is requesting.   Patient is scheduled for today with [facility] for a televisit, the patient is unable to do video visits.

## 2019-07-13 NOTE — Progress Notes (Signed)
TELEPHONE ENCOUNTER   Patient verbally agreed to telephone visit and is aware that copayment and coinsurance may apply. Patient was treated using telemedicine according to accepted telemedicine protocols.  Location of the patient: home Location of provider: [facility] [employer] of all persons participating in the telemedicine service and role in the encounter: Jarold Motto, [place] , [name]  Subjective:   Chief Complaint  Patient presents with  . 2 tick bites     HPI    Tick bite Pt found 2 ticks on back of her L thigh yesterday, ticks removed and cleansed with Hydrogen peroxide and applied neosporin. Pt would like to have a round of Doxycycline due to does not know how long they were there, she has been through this before.  She tolerates doxycycline well.  She denies any prior history of tickborne illness, however she states that she always receives doxycycline after tick bites.  Denies: Fever, cough, chills, joint pain, rash, neck stiffness    Patient Active Problem List   Diagnosis Date Noted  . Chronic pain syndrome 07/04/2019  . Dysphasia 04/21/2019  . Sicca syndrome, unspecified (HCC) 04/21/2019  . Dental caries 04/21/2019  . Fall on same level from slipping, tripping, or stumbling 04/21/2019  . Weight loss 04/21/2019  . Osteopenia 10/25/2018  . Situational mixed anxiety and depressive disorder 09/30/2018  . 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 Fibromyalgia 09/11/2018  . Chronic back pain 09/11/2018  . Gastroesophageal reflux disease without esophagitis 09/11/2018  . 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 Opioid contract exists 04/19/2018  . Acute medial meniscal tear 11/26/2017  . Trigger finger 08/17/2017  . Low vitamin D level 07/05/2017  . B12 deficiency 07/05/2017  . Insomnia 07/05/2017  . Chronic right shoulder pain 05/30/2017  . HLD (hyperlipidemia) 05/30/2017   Social History   Tobacco Use  . Smoking status:  Former Games developer  . Smokeless tobacco: Never Used  Substance Use Topics  . Alcohol use: Yes    Alcohol/week: 7.0 standard drinks    Types: 7 Glasses of wine per week    Current Outpatient Medications:  .  albuterol (PROVENTIL HFA;VENTOLIN HFA) 108 (90 Base) MCG/ACT inhaler, INHALE 2 PUFFS INTO THE LUNGS EVERY 6 HOURS AS NEEDED FOR WHEEZING OR FOR SHORTNESS OF BREATH, Disp: 1 Inhaler, Rfl: 3 .  atorvastatin (LIPITOR) 20 MG tablet, , Disp: , Rfl:  .  cyanocobalamin (,VITAMIN B-12,) 1000 MCG/ML injection, 1000 mcg (1 mg) injection once every other week, Disp: 6 mL, Rfl: 0 .  LORazepam (ATIVAN) 2 MG tablet, Take 1 tablet (2 mg total) by mouth at bedtime., Disp: 90 tablet, Rfl: 1 .  Oxycodone HCl 10 MG TABS, Take 1 tablet (10 mg total) by mouth every 4 (four) hours., Disp: 180 tablet, Rfl: 0 .  Syringe/Needle, Disp, (SYRINGE 3CC/25GX1") 25G X 1" 3 ML MISC, 1 application by Does not apply route once a week., Disp: 12 each, Rfl: 0 .  doxycycline (VIBRA-TABS) 100 MG tablet, Take 1 tablet (100 mg total) by mouth 2 (two) times daily., Disp: 20 tablet, Rfl: 0 Allergies  Allergen Reactions  . Ambien [Zolpidem Tartrate]   . Flexeril [Cyclobenzaprine] Other (See Comments)    Patient unsure of reaction  . Naproxen Other (See Comments)    Patient unsure of reaction.    Assessment & Plan:   1. Insect bite of left thigh, initial encounter    Given that she does not know how long the tick was on  her, I do not think it is unreasonable to go ahead and treat empirically with doxycycline.  Does not describe any signs of tickborne illness, or cellulitis at this time, however did recommend that she follow-up if she develops new symptoms.  No orders of the defined types were placed in this encounter.  Meds ordered this encounter  Medications  . doxycycline (VIBRA-TABS) 100 MG tablet    Sig: Take 1 tablet (100 mg total) by mouth 2 (two) times daily.    Dispense:  20 tablet    Refill:  0    Order Specific  Question:   Supervising Provider    Answer:   Juleen China, ERICA Luckey, PA 07/13/2019  Time spent with the patient: 8 minutes, spent in obtaining information about her symptoms, reviewing her previous labs, evaluations, and treatments, counseling her about her condition (please see the discussed topics above), and developing a plan to further investigate it; she had a number of questions which I addressed.

## 2019-07-26 ENCOUNTER — Other Ambulatory Visit: Payer: Self-pay

## 2019-07-26 ENCOUNTER — Ambulatory Visit (INDEPENDENT_AMBULATORY_CARE_PROVIDER_SITE_OTHER): Payer: Medicare Other | Admitting: Family Medicine

## 2019-07-26 ENCOUNTER — Encounter

## 2019-07-26 ENCOUNTER — Encounter: Payer: Self-pay | Admitting: Family Medicine

## 2019-07-26 VITALS — Ht 64.0 in | Wt 110.0 lb

## 2019-07-26 DIAGNOSIS — G894 Chronic pain syndrome: Secondary | ICD-10-CM | POA: Diagnosis not present

## 2019-07-26 DIAGNOSIS — M3500 Sicca syndrome, unspecified: Secondary | ICD-10-CM | POA: Diagnosis not present

## 2019-07-26 MED ORDER — OXYCODONE-ACETAMINOPHEN 10-325 MG PO TABS: 1.0000 | ORAL_TABLET | ORAL | 0 refills | Status: DC | PRN

## 2019-07-26 NOTE — Progress Notes (Signed)
Virtual Visit via Video   Due to the COVID-19 pandemic, this visit was completed with telemedicine (audio/video) technology to reduce patient and provider exposure as well as to preserve personal protective equipment.   I connected with Patricia Underwood by a video enabled telemedicine application and verified that I am speaking with the correct person using two identifiers. Location patient: Home Location provider: [facility], Office Persons participating in the virtual visit: Willo Yoon, Helane Rima, DO   I discussed the limitations of evaluation and management by telemedicine and the availability of in person appointments. The patient expressed understanding and agreed to proceed.  Care Team   Patient Care Team: Helane Rima, DO as PCP - General (Family Medicine) Jodelle Red, MD as PCP - Cardiology (Cardiology) Luciano Cutter, MD as Consulting Physician (Pulmonary Disease) Andrena Mews, DO as Consulting Physician (Sports Medicine)  Subjective:   HPI: Dentist wanted her to be seen by our office due to ongoing dry mouth. Has gotten to the point where she is going to have to have teeth extracted. Dentist asked if she has Sjogren's. Patient states that she was diagnosed with it many years ago but it fell of of her list. We checked labs at the last visit - were negative - but symptoms of dry mouth, dry eyes, dry skin worsening. Severe OA changes as well. Dx fibromyalgia.   Pain is worse with new dentalgia. Using 1.5 tabs of Oxycodone at times. Still wants to transition to [facility]. Would like names of clinics within 1 hour drive.   Review of Systems  Constitutional: Negative for chills and fever.  HENT: Negative for hearing loss and tinnitus.   Eyes: Negative for blurred vision and double vision.  Respiratory: Negative for cough and wheezing.   Cardiovascular: Negative for chest pain, palpitations and leg swelling.  Gastrointestinal: Negative for nausea and  vomiting.  Genitourinary: Negative for dysuria and urgency.  Neurological: Negative for dizziness and headaches.  Psychiatric/Behavioral: Negative for depression.     Patient Active Problem List   Diagnosis Date Noted  . Chronic pain syndrome 07/04/2019  . Dysphasia 04/21/2019  . Sicca syndrome, unspecified (HCC) 04/21/2019  . Dental caries 04/21/2019  . Fall on same level from slipping, tripping, or stumbling 04/21/2019  . Weight loss 04/21/2019  . Osteopenia 10/25/2018  . Situational mixed anxiety and depressive disorder 09/30/2018  . 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 Fibromyalgia 09/11/2018  . Chronic back pain 09/11/2018  . Gastroesophageal reflux disease without esophagitis 09/11/2018  . 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 Opioid contract exists 04/19/2018  . Acute medial meniscal tear 11/26/2017  . Trigger finger 08/17/2017  . Low vitamin D level 07/05/2017  . B12 deficiency 07/05/2017  . Insomnia 07/05/2017  . Chronic right shoulder pain 05/30/2017  . HLD (hyperlipidemia) 05/30/2017    Social History   Tobacco Use  . Smoking status: Former Games developer  . Smokeless tobacco: Never Used  Substance Use Topics  . Alcohol use: Yes    Alcohol/week: 7.0 standard drinks    Types: 7 Glasses of wine per week   Current Outpatient Medications:  .  albuterol (PROVENTIL HFA;VENTOLIN HFA) 108 (90 Base) MCG/ACT inhaler, INHALE 2 PUFFS INTO THE LUNGS EVERY 6 HOURS AS NEEDED FOR WHEEZING OR FOR SHORTNESS OF BREATH, Disp: 1 Inhaler, Rfl: 3 .  atorvastatin (LIPITOR) 20 MG tablet, , Disp: , Rfl:  .  cyanocobalamin (,VITAMIN B-12,) 1000 MCG/ML injection, 1000 mcg (1 mg) injection once every other  week, Disp: 6 mL, Rfl: 0 .  LORazepam (ATIVAN) 2 MG tablet, Take 1 tablet (2 mg total) by mouth at bedtime., Disp: 90 tablet, Rfl: 1 .  Oxycodone HCl 10 MG TABS, Take 1 tablet (10 mg total) by mouth every 4 (four) hours., Disp: 180 tablet, Rfl: 0 .  Syringe/Needle, Disp,  (SYRINGE 3CC/25GX1") 25G X 1" 3 ML MISC, 1 application by Does not apply route once a week., Disp: 12 each, Rfl: 0  Allergies  Allergen Reactions  . Ambien [Zolpidem Tartrate]   . Flexeril [Cyclobenzaprine] Other (See Comments)    Patient unsure of reaction  . Naproxen Other (See Comments)    Patient unsure of reaction.    Objective:   VITALS: Per patient if applicable, see vitals. GENERAL: Alert and in no acute distress. CARDIOPULMONARY: No increased WOB. Speaking in clear sentences.  PSYCH: Pleasant and cooperative. Speech normal rate and rhythm. Affect is appropriate. Insight and judgement are appropriate. Attention is focused, linear, and appropriate.  NEURO: Oriented as arrived to appointment on time with no prompting.   Depression screen [facility] 2/9 04/19/2019 04/19/2019 09/11/2018  Decreased Interest 0 0 0  Down, Depressed, Hopeless 0 2 0  PHQ - 2 Score 0 2 0  Altered sleeping 0 2 -  Tired, decreased energy 0 3 -  Change in appetite 0 3 -  Feeling bad or failure about yourself  0 0 -  Trouble concentrating 0 3 -  Moving slowly or fidgety/restless 0 0 -  Suicidal thoughts 0 0 -  PHQ-9 Score 0 13 -  Difficult doing work/chores Not difficult at all Not difficult at all -   Assessment and Plan:   Patricia Underwood was seen today for follow-up.  Diagnoses and all orders for this visit:  Sicca syndrome, unspecified ([place]) Comments: To Rheumatology for further testing.  Orders: -     Ambulatory referral to Rheumatology  Chronic pain syndrome Comments: Will work with referral coordinator to provide a list of clinics. Orders: -     oxyCODONE-acetaminophen (PERCOCET) 10-325 MG tablet; Take 1 tablet by mouth every 4 (four) hours as needed for pain.   [name] COVID-19 Education: The signs and symptoms of COVID-19 were discussed with the patient and how to seek care for testing if needed. The importance of social distancing was discussed today. . Reviewed expectations re: course of current medical  issues. . Discussed self-management of symptoms. . Outlined signs and symptoms indicating need for more acute intervention. . Patient verbalized understanding and all questions were answered. [name] Health Maintenance issues including appropriate healthy diet, exercise, and smoking avoidance were discussed with patient. . See orders for this visit as documented in the electronic medical record.  Briscoe Deutscher, DO  Records requested if needed. Time spent: 15 minutes, of which >50% was spent in obtaining information about her symptoms, reviewing her previous labs, evaluations, and treatments, counseling her about her condition (please see the discussed topics above), and developing a plan to further investigate it; she had a number of questions which I addressed.

## 2019-08-01 ENCOUNTER — Telehealth: Payer: Self-pay | Admitting: Physical Therapy

## 2019-08-01 NOTE — Telephone Encounter (Signed)
I spoke with patient and informed her that this was noted taken during her visit.  Per note, "Will work with referral coordinator to provide a list of clinics".   Please advise.

## 2019-08-01 NOTE — Telephone Encounter (Signed)
Copied from [place] [id number]. Topic: General - Other >> Aug 01, 2019 10:15 AM Mathis Bud wrote: Reason for CRM: Patient is requesting a call back from PCP nurse due to her pain management referral.  [facility] not [facility]. Call back [phone]

## 2019-08-01 NOTE — Telephone Encounter (Signed)
I need a referral put in for her please.

## 2019-08-02 ENCOUNTER — Ambulatory Visit: Payer: Medicare Other | Admitting: Family Medicine

## 2019-08-02 ENCOUNTER — Other Ambulatory Visit: Payer: Self-pay

## 2019-08-02 DIAGNOSIS — G894 Chronic pain syndrome: Secondary | ICD-10-CM

## 2019-08-02 NOTE — Telephone Encounter (Signed)
Ok. Referral placed

## 2019-08-04 DIAGNOSIS — L602 Onychogryphosis: Secondary | ICD-10-CM | POA: Diagnosis not present

## 2019-08-04 DIAGNOSIS — L03031 Cellulitis of right toe: Secondary | ICD-10-CM | POA: Diagnosis not present

## 2019-08-04 DIAGNOSIS — L02611 Cutaneous abscess of right foot: Secondary | ICD-10-CM | POA: Diagnosis not present

## 2019-08-04 DIAGNOSIS — B351 Tinea unguium: Secondary | ICD-10-CM | POA: Diagnosis not present

## 2019-08-05 ENCOUNTER — Telehealth: Payer: Self-pay | Admitting: Family Medicine

## 2019-08-05 NOTE — Telephone Encounter (Signed)
Patient is calling to check on the status of referral for pain management?  Patient also has appt with Rheumatology on 08/30/2019 with Dr. Elpidio Galea. Can [facility] assist with a sooner appt. So that the patient can get an appt with Dr. Juleen China before she leaves to discuss. Please Advise- [phone]

## 2019-08-05 NOTE — Telephone Encounter (Signed)
Routing to  [place] and to [place].  Please advise.

## 2019-08-09 ENCOUNTER — Other Ambulatory Visit: Payer: Self-pay

## 2019-08-09 DIAGNOSIS — F1120 Opioid dependence, uncomplicated: Secondary | ICD-10-CM

## 2019-08-09 DIAGNOSIS — G8929 Other chronic pain: Secondary | ICD-10-CM

## 2019-08-09 DIAGNOSIS — Z79899 Other long term (current) drug therapy: Secondary | ICD-10-CM

## 2019-08-09 DIAGNOSIS — G894 Chronic pain syndrome: Secondary | ICD-10-CM

## 2019-08-09 NOTE — Telephone Encounter (Signed)
Letter received from office wanted to get lab results faxed to move app up. I have faxed information to office. Need to call patient and let her know to call about moving up as well as refereral to [facility] that was started for her.

## 2019-08-15 DIAGNOSIS — B351 Tinea unguium: Secondary | ICD-10-CM | POA: Diagnosis not present

## 2019-08-17 ENCOUNTER — Ambulatory Visit: Payer: Medicare Other | Admitting: Family Medicine

## 2019-08-17 DIAGNOSIS — M797 Fibromyalgia: Secondary | ICD-10-CM | POA: Diagnosis not present

## 2019-08-17 DIAGNOSIS — Z79899 Other long term (current) drug therapy: Secondary | ICD-10-CM | POA: Diagnosis not present

## 2019-08-17 DIAGNOSIS — M545 Low back pain: Secondary | ICD-10-CM | POA: Diagnosis not present

## 2019-08-17 DIAGNOSIS — M130 Polyarthritis, unspecified: Secondary | ICD-10-CM | POA: Diagnosis not present

## 2019-08-17 DIAGNOSIS — G8929 Other chronic pain: Secondary | ICD-10-CM | POA: Diagnosis not present

## 2019-08-22 ENCOUNTER — Telehealth: Payer: Medicare Other | Admitting: Family Medicine

## 2019-08-24 ENCOUNTER — Encounter: Payer: Self-pay | Admitting: Family Medicine

## 2019-08-24 ENCOUNTER — Ambulatory Visit (INDEPENDENT_AMBULATORY_CARE_PROVIDER_SITE_OTHER): Payer: Medicare Other | Admitting: Family Medicine

## 2019-08-24 VITALS — BP 120/60 | HR 62 | Temp 98.6°F | Ht 64.0 in | Wt 110.4 lb

## 2019-08-24 DIAGNOSIS — H903 Sensorineural hearing loss, bilateral: Secondary | ICD-10-CM

## 2019-08-24 DIAGNOSIS — L709 Acne, unspecified: Secondary | ICD-10-CM

## 2019-08-24 DIAGNOSIS — K219 Gastro-esophageal reflux disease without esophagitis: Secondary | ICD-10-CM

## 2019-08-24 DIAGNOSIS — E538 Deficiency of other specified B group vitamins: Secondary | ICD-10-CM | POA: Diagnosis not present

## 2019-08-24 DIAGNOSIS — G894 Chronic pain syndrome: Secondary | ICD-10-CM | POA: Diagnosis not present

## 2019-08-24 DIAGNOSIS — J410 Simple chronic bronchitis: Secondary | ICD-10-CM | POA: Diagnosis not present

## 2019-08-24 DIAGNOSIS — E782 Mixed hyperlipidemia: Secondary | ICD-10-CM | POA: Diagnosis not present

## 2019-08-24 DIAGNOSIS — Z79891 Long term (current) use of opiate analgesic: Secondary | ICD-10-CM | POA: Diagnosis not present

## 2019-08-24 MED ORDER — TRETINOIN 0.1 % EX CREA: TOPICAL_CREAM | Freq: Every day | CUTANEOUS | 2 refills | Status: DC

## 2019-08-24 NOTE — Progress Notes (Signed)
Patricia Underwood is a 68 y.o. female is here for follow up.  History of Present Illness:   Patricia Underwood, CMA acting as scribe for Dr. Briscoe Deutscher.   HPI: Patient in for follow up. She has been seen by [facility] and has been prescribed Buprenorphine Hcl. She has been doing well with medication. It is pretty expensive. She has follow up with them next week.   There are no preventive care reminders to display for this patient.   Depression screen [facility] 2/9 08/24/2019 04/19/2019 04/19/2019  Decreased Interest 0 0 0  Down, Depressed, Hopeless 0 0 2  PHQ - 2 Score 0 0 2  Altered sleeping 2 0 2  Tired, decreased energy 1 0 3  Change in appetite 2 0 3  Feeling bad or failure about yourself  0 0 0  Trouble concentrating 0 0 3  Moving slowly or fidgety/restless 0 0 0  Suicidal thoughts 0 0 0  PHQ-9 Score 5 0 13  Difficult doing work/chores Not difficult at all Not difficult at all Not difficult at all   PMHx, SurgHx, SocialHx, FamHx, Medications, and Allergies were reviewed in the Visit Navigator and updated as appropriate.   Patient Active Problem List   Diagnosis Date Noted  . Simple chronic bronchitis (Patricia Underwood) 08/24/2019  . Chronic pain syndrome 07/04/2019  . Dysphasia 04/21/2019  . Sicca syndrome, unspecified ([place]) 04/21/2019  . Dental caries 04/21/2019  . Osteopenia 10/25/2018  . Situational mixed anxiety and depressive disorder 09/30/2018  . Fibromyalgia 09/11/2018  . Sensorineural hearing loss (SNHL) of both ears, followed by [facility], Arlie Solomons 07/20/2018  . Opioid contract exists, [employer] 04/19/2018  . Acute medial meniscal tear 11/26/2017  . Low vitamin D level 07/05/2017  . B12 deficiency, Rx B12 injections 07/05/2017  . Insomnia 07/05/2017  . Chronic right shoulder pain 05/30/2017  . HLD (hyperlipidemia) 05/30/2017   Social History   Tobacco Use  . Smoking status: Former Research scientist (life sciences)  . Smokeless tobacco: Never Used  Substance Use Topics  . Alcohol  use: Yes    Alcohol/week: 7.0 standard drinks    Types: 7 Glasses of wine per week  . Drug use: No   Current Medications and Allergies   .  albuterol (PROVENTIL HFA;VENTOLIN HFA) 108 (90 Base) MCG/ACT inhaler, INHALE 2 PUFFS INTO THE LUNGS EVERY 6 HOURS AS NEEDED FOR WHEEZING OR FOR SHORTNESS OF BREATH, Disp: 1 Inhaler, Rfl: 3 .  buprenorphine-naloxone (SUBOXONE) 8-2 mg SUBL SL tablet, Place 1 tablet under the tongue daily. 1/2 tab four times a day, Disp: , Rfl:  .  cyanocobalamin (,VITAMIN B-12,) 1000 MCG/ML injection, 1000 mcg (1 mg) injection once every other week, Disp: 6 mL, Rfl: 0 .  LORazepam (ATIVAN) 2 MG tablet, Take 1 tablet (2 mg total) by mouth at bedtime., Disp: 90 tablet, Rfl: 1 .  Syringe/Needle, Disp, (SYRINGE 3CC/25GX1") 25G X 1" 3 ML MISC, 1 application by Does not apply route once a week., Disp: 12 each, Rfl: 0 .  atorvastatin (LIPITOR) 20 MG tablet, , Disp: , Rfl:    Allergies  Allergen Reactions  . Ambien [Zolpidem Tartrate]   . Flexeril [Cyclobenzaprine] Other (See Comments)    Patient unsure of reaction  . Naproxen Other (See Comments)    Patient unsure of reaction.   Review of Systems   Pertinent items are noted in the HPI. Otherwise, a complete ROS is negative.  Vitals   Vitals:   08/24/19 1302  BP: 120/60  Pulse: 62  Temp: 98.6 F (37 C)  TempSrc: Temporal  SpO2: 92%  Weight: 110 lb 6.4 oz (50.1 kg)  Height: 5\' 4"  (1.626 m)     Body mass index is 18.95 kg/m.  Physical Exam   Physical Exam Vitals signs and nursing note reviewed.  HENT:     Head: Normocephalic and atraumatic.  Eyes:     Pupils: Pupils are equal, round, and reactive to light.  Neck:     Musculoskeletal: Normal range of motion and neck supple.  Cardiovascular:     Rate and Rhythm: Normal rate and regular rhythm.     Heart sounds: Normal heart sounds.  Pulmonary:     Effort: Pulmonary effort is normal.  Abdominal:     Palpations: Abdomen is soft.  Skin:    General:  Skin is warm.  Psychiatric:        Behavior: Behavior normal.     Results for orders placed or performed in visit on 04/19/19  SAR CoV2 Serology (COVID 19)AB(IGG)IA  Result Value Ref Range   SARS CoV2 AB IGG NEGATIVE   Sedimentation rate  Result Value Ref Range   Sed Rate 4 0 - 30 mm/hr  C-reactive protein  Result Value Ref Range   CRP <1.0 0.5 - 20.0 mg/dL  Sjogrens syndrome-A extractable nuclear antibody  Result Value Ref Range   SSA (Ro) (ENA) Antibody, IgG <1.0 NEG <1.0 NEG AI  Sjogrens syndrome-B extractable nuclear antibody  Result Value Ref Range   SSB (La) (ENA) Antibody, IgG <1.0 NEG <1.0 NEG AI   Assessment and Plan   Patricia Underwood was seen today for follow-up.  Diagnoses and all orders for this visit:  Chronic pain syndrome  Simple chronic bronchitis (HCC)  Opioid contract exists  B12 deficiency  Mixed hyperlipidemia  Gastroesophageal reflux disease without esophagitis  Sensorineural hearing loss (SNHL) of both ears, followed by [facility], [facility]  Adult acne -     tretinoin (RETIN-A) 0.1 % cream; Apply topically at bedtime.    . Orders and follow up as documented in EpicCare, reviewed diet, exercise and weight control, cardiovascular risk and specific lipid/LDL goals reviewed, reviewed medications and side effects in detail.  . Reviewed expectations re: course of current medical issues. . Outlined signs and symptoms indicating need for more acute intervention. . Patient verbalized understanding and all questions were answered. . Patient received an After Visit Summary.  CMA served as Hanley Ben during this visit. History, Physical, and Plan performed by medical provider. The above documentation has been reviewed and is accurate and complete. Neurosurgeon, D.O.  Helane Rima, DO [place], [facility] 08/28/2019

## 2019-08-25 ENCOUNTER — Telehealth: Payer: Self-pay | Admitting: Family Medicine

## 2019-08-25 NOTE — Telephone Encounter (Signed)
See note  Copied from [place] [phone]. Topic: General - Other >> Aug 25, 2019  3:32 PM Leward Quan A wrote: Reason for CRM: Patient called to inquire if Dr Juleen China got in contact with Emelia Loron in reference to the conversation on 08/24/2019 . Asking for a call back with an update please at Ph#  [phone]

## 2019-08-25 NOTE — Telephone Encounter (Signed)
Called patent let her know I have called office l/m to call back so Dr. Juleen China can talk with the NP who seen her.

## 2019-08-28 ENCOUNTER — Encounter: Payer: Self-pay | Admitting: Family Medicine

## 2019-08-28 NOTE — Telephone Encounter (Signed)
Let patient know that I will continue to try to call this week, but I went ahead and wrote a letter for her to take to her appointment. Can be found in mychart, but we can print so that I can sign and put in [facility] envelope.

## 2019-08-29 ENCOUNTER — Other Ambulatory Visit: Payer: Self-pay

## 2019-08-29 DIAGNOSIS — E538 Deficiency of other specified B group vitamins: Secondary | ICD-10-CM

## 2019-08-29 MED ORDER — "SYRINGE 25G X 1"" 3 ML MISC": 1.0000 "application " | 3 refills | Status: DC

## 2019-08-29 MED ORDER — CYANOCOBALAMIN 1000 MCG/ML IJ SOLN: INTRAMUSCULAR | 3 refills | Status: DC

## 2019-08-29 NOTE — Telephone Encounter (Signed)
Called patient she would like to have letter left at reception will come by later to pick up.

## 2019-08-30 DIAGNOSIS — R682 Dry mouth, unspecified: Secondary | ICD-10-CM | POA: Diagnosis not present

## 2019-08-30 DIAGNOSIS — Z681 Body mass index (BMI) 19 or less, adult: Secondary | ICD-10-CM | POA: Diagnosis not present

## 2019-08-30 DIAGNOSIS — M2550 Pain in unspecified joint: Secondary | ICD-10-CM | POA: Diagnosis not present

## 2019-08-31 DIAGNOSIS — L03031 Cellulitis of right toe: Secondary | ICD-10-CM | POA: Diagnosis not present

## 2019-08-31 DIAGNOSIS — B351 Tinea unguium: Secondary | ICD-10-CM | POA: Diagnosis not present

## 2019-09-01 DIAGNOSIS — G8929 Other chronic pain: Secondary | ICD-10-CM | POA: Diagnosis not present

## 2019-09-01 DIAGNOSIS — M130 Polyarthritis, unspecified: Secondary | ICD-10-CM | POA: Diagnosis not present

## 2019-09-01 DIAGNOSIS — Z79899 Other long term (current) drug therapy: Secondary | ICD-10-CM | POA: Diagnosis not present

## 2019-09-01 DIAGNOSIS — M545 Low back pain: Secondary | ICD-10-CM | POA: Diagnosis not present

## 2019-09-01 DIAGNOSIS — Z1159 Encounter for screening for other viral diseases: Secondary | ICD-10-CM | POA: Diagnosis not present

## 2019-09-01 DIAGNOSIS — M797 Fibromyalgia: Secondary | ICD-10-CM | POA: Diagnosis not present

## 2019-09-12 DIAGNOSIS — B351 Tinea unguium: Secondary | ICD-10-CM | POA: Diagnosis not present

## 2019-09-12 DIAGNOSIS — L03031 Cellulitis of right toe: Secondary | ICD-10-CM | POA: Diagnosis not present

## 2019-09-12 DIAGNOSIS — L03032 Cellulitis of left toe: Secondary | ICD-10-CM | POA: Diagnosis not present

## 2019-09-13 ENCOUNTER — Telehealth: Payer: Self-pay | Admitting: Family Medicine

## 2019-09-13 NOTE — Telephone Encounter (Signed)
Can you send message or do you want me to let patient know we can not do?

## 2019-09-13 NOTE — Telephone Encounter (Signed)
Please advise 

## 2019-09-13 NOTE — Telephone Encounter (Signed)
Patient called saying that before Dr. Juleen China left [facility], she recommend the patient to see Dr. Birdie Riddle as her new PCP. I did let the patient know that Dr. Birdie Riddle was not taking new patients and she says that Dr. Juleen China was suppose to talk to Dr. Birdie Riddle about this TOC. I do not see an encounter about an TOC in patient chart. Please advise.

## 2019-09-13 NOTE — Telephone Encounter (Signed)
Copied from [place] [phone]. Topic: General - Other >> Sep 13, 2019 10:13 AM Keene Breath wrote: Reason for CRM: Patient called to ask if the nurse could call on her behalf to [facility] office to ask if Dr. Birdie Riddle would take her on as a patient.  Patient stated that Dr. Juleen China told her before she left that she wanted her to be a patient of Dr. Nancy Nordmann.  Please advise and call patient back to discuss.  CB# [phone]

## 2019-09-13 NOTE — Telephone Encounter (Signed)
Unable to accept at this time due to full schedule

## 2019-09-14 ENCOUNTER — Other Ambulatory Visit: Payer: Self-pay

## 2019-09-14 ENCOUNTER — Encounter: Payer: Self-pay | Admitting: Family Medicine

## 2019-09-14 ENCOUNTER — Ambulatory Visit (INDEPENDENT_AMBULATORY_CARE_PROVIDER_SITE_OTHER): Payer: Medicare Other | Admitting: Family Medicine

## 2019-09-14 ENCOUNTER — Ambulatory Visit (INDEPENDENT_AMBULATORY_CARE_PROVIDER_SITE_OTHER): Payer: Medicare Other

## 2019-09-14 VITALS — BP 120/66 | HR 72 | Ht 64.0 in | Wt 106.4 lb

## 2019-09-14 DIAGNOSIS — E538 Deficiency of other specified B group vitamins: Secondary | ICD-10-CM

## 2019-09-14 DIAGNOSIS — R29898 Other symptoms and signs involving the musculoskeletal system: Secondary | ICD-10-CM | POA: Diagnosis not present

## 2019-09-14 DIAGNOSIS — G894 Chronic pain syndrome: Secondary | ICD-10-CM

## 2019-09-14 DIAGNOSIS — M5136 Other intervertebral disc degeneration, lumbar region: Secondary | ICD-10-CM | POA: Diagnosis not present

## 2019-09-14 DIAGNOSIS — M533 Sacrococcygeal disorders, not elsewhere classified: Secondary | ICD-10-CM | POA: Diagnosis not present

## 2019-09-14 DIAGNOSIS — M51369 Other intervertebral disc degeneration, lumbar region without mention of lumbar back pain or lower extremity pain: Secondary | ICD-10-CM

## 2019-09-14 DIAGNOSIS — M7918 Myalgia, other site: Secondary | ICD-10-CM | POA: Diagnosis not present

## 2019-09-14 DIAGNOSIS — M8580 Other specified disorders of bone density and structure, unspecified site: Secondary | ICD-10-CM

## 2019-09-14 DIAGNOSIS — M545 Low back pain: Secondary | ICD-10-CM | POA: Diagnosis not present

## 2019-09-14 DIAGNOSIS — R7989 Other specified abnormal findings of blood chemistry: Secondary | ICD-10-CM

## 2019-09-14 DIAGNOSIS — R202 Paresthesia of skin: Secondary | ICD-10-CM

## 2019-09-14 DIAGNOSIS — Z5181 Encounter for therapeutic drug level monitoring: Secondary | ICD-10-CM

## 2019-09-14 LAB — CBC WITH DIFFERENTIAL/PLATELET
Basophils Absolute: 0.1 10*3/uL (ref 0.0–0.1)
Basophils Relative: 0.6 % (ref 0.0–3.0)
Eosinophils Absolute: 0.5 10*3/uL (ref 0.0–0.7)
Eosinophils Relative: 4.5 % (ref 0.0–5.0)
HCT: 40.2 % (ref 36.0–46.0)
Hemoglobin: 13.6 g/dL (ref 12.0–15.0)
Lymphocytes Relative: 21.9 % (ref 12.0–46.0)
Lymphs Abs: 2.4 10*3/uL (ref 0.7–4.0)
MCHC: 33.8 g/dL (ref 30.0–36.0)
MCV: 94.2 fl (ref 78.0–100.0)
Monocytes Absolute: 0.6 10*3/uL (ref 0.1–1.0)
Monocytes Relative: 5.4 % (ref 3.0–12.0)
Neutro Abs: 7.3 10*3/uL (ref 1.4–7.7)
Neutrophils Relative %: 67.6 % (ref 43.0–77.0)
Platelets: 277 10*3/uL (ref 150.0–400.0)
RBC: 4.27 Mil/uL (ref 3.87–5.11)
RDW: 12.9 % (ref 11.5–15.5)
WBC: 10.9 10*3/uL — ABNORMAL HIGH (ref 4.0–10.5)

## 2019-09-14 LAB — COMPREHENSIVE METABOLIC PANEL
ALT: 14 U/L (ref 0–35)
AST: 21 U/L (ref 0–37)
Albumin: 4.4 g/dL (ref 3.5–5.2)
Alkaline Phosphatase: 84 U/L (ref 39–117)
BUN: 14 mg/dL (ref 6–23)
CO2: 29 mEq/L (ref 19–32)
Calcium: 9.4 mg/dL (ref 8.4–10.5)
Chloride: 100 mEq/L (ref 96–112)
Creatinine, Ser: 0.66 mg/dL (ref 0.40–1.20)
GFR: 88.88 mL/min (ref >60.00)
Glucose, Bld: 104 mg/dL — ABNORMAL HIGH (ref 70–99)
Potassium: 4.3 mEq/L (ref 3.5–5.1)
Sodium: 137 mEq/L (ref 135–145)
Total Bilirubin: 0.4 mg/dL (ref 0.2–1.2)
Total Protein: 6.8 g/dL (ref 6.0–8.3)

## 2019-09-14 LAB — VITAMIN B12: Vitamin B-12: >1500 pg/mL — ABNORMAL HIGH (ref 211–911)

## 2019-09-14 LAB — TSH: TSH: 1.1 u[IU]/mL (ref 0.35–4.50)

## 2019-09-14 LAB — CK: Total CK: 429 U/L — ABNORMAL HIGH (ref 7–177)

## 2019-09-14 NOTE — Telephone Encounter (Signed)
I will send a message to Dr. Birdie Riddle. Briscoe Deutscher, DO

## 2019-09-14 NOTE — Patient Instructions (Addendum)
Thank you for coming in today.  Work on home physical therapy exercises.  If worsening or not improving shortly please let me know neck step would likely be MRI of low back.  Keep me updated.  I will get results of x-rays and labs to you when I get them back probably tomorrow or the next day.   Paresthesia Paresthesia is a burning or prickling feeling. This feeling can happen in any part of the body. It often happens in the hands, arms, legs, or feet. Usually, it is not painful. In most cases, the feeling goes away in a short time and is not a sign of a serious problem. If you have paresthesia that lasts a long time, you may need to be seen by your doctor. Follow these instructions at home: Alcohol use   Do not drink alcohol if: ? Your doctor tells you not to drink. ? You are pregnant, may be pregnant, or are planning to become pregnant.  If you drink alcohol: ? Limit how much you use to:  0-1 drink a day for women.  0-2 drinks a day for men. ? Be aware of how much alcohol is in your drink. In the [place] one drink equals one 12 oz bottle of beer (355 mL), one 5 oz glass of wine (148 mL), or one 1 oz glass of hard liquor (44 mL). Nutrition   Eat a healthy diet. This includes: ? Eating foods that have a lot of fiber in them, such as fresh fruits and vegetables, whole grains, and beans. ? Limiting foods that have a lot of fat and processed sugars in them, such as fried or sweet foods. General instructions  Take over-the-counter and prescription medicines only as told by your doctor.  Do not use any products that have nicotine or tobacco in them, such as cigarettes and e-cigarettes. If you need help quitting, ask your doctor.  If you have diabetes, work with your doctor to make sure your blood sugar stays in a healthy range.  If your feet feel numb: ? Check for redness, warmth, and swelling every day. ? Wear padded socks and comfortable shoes. These help protect your feet.   Keep all follow-up visits as told by your doctor. This is important. Contact a doctor if:  You have paresthesia that gets worse or does not go away.  Your burning or prickling feeling gets worse when you walk.  You have pain or cramps.  You feel dizzy.  You have a rash. Get help right away if you:  Feel weak.  Have trouble walking or moving.  Have problems speaking, understanding, or seeing.  Feel confused.  Cannot control when you pee (urinate) or poop (have a bowel movement).  Lose feeling (have numbness) after an injury.  Have new weakness in an arm or leg.  Pass out (faint). Summary  Paresthesia is a burning or prickling feeling. It often happens in the hands, arms, legs, or feet.  In most cases, the feeling goes away in a short time and is not a sign of a serious problem.  If you have paresthesia that lasts a long time, you may need to be seen by your doctor. This information is not intended to replace advice given to you by your health care provider. Make sure you discuss any questions you have with your health care provider. Document Released: 10/09/2008 Document Revised: 11/22/2018 Document Reviewed: 11/05/2017 Elsevier Patient Education  2020 [employer].   Please perform the exercise program that  we have prepared for you and gone over in detail on a daily basis.  In addition to the handout you were provided you can access your program through: [email or web address]   Your unique program code is: [email or web address]

## 2019-09-14 NOTE — Progress Notes (Signed)
I, [name], LAT, ATC, am serving as scribe for Dr. Clementeen Graham.  Patricia Underwood is a 68 y.o. female who presents to [facility] today for c/o intermittent left leg weakness and numbness.  She has experienced new pain in her left low back/buttocks and intermittent left leg numbness and weakness.  She notes typically this will occur when she wakes up in the morning gets out of bed.  She notes that her leg has suddenly giving way on her several times resulted in a fall.  She also notes that this tends to occur with a tingling sensation into her leg.  She cannot specify if the tingling sensation is in the anterior thigh or posterior leg or both.  She has this tends to resolve relatively quickly.  She notes most the time her leg is not weak or numb or tingling.  She denies any new pain in her L-spine.  She denies bowel bladder dysfunction.  She does have a history of lumbar fusion in the past and has had sciatica in the past.  She notes that her current symptoms are not consistent with previous episodes of sciatica.  .  She also notes that she is had a bit of an unintentional weight loss.  She notes with her weight today she is had a 4 pound weight loss in the last month.  She does note that her son has been staying with her recently and he has celiac disease that she has been eating a gluten-free diet in the last month.  Additionally she has a history of B12 deficiency with B12 measured last in May 2020 at 383.  She just gave herself a B12 shot recently.   ROS:  As above  Exam:  BP 120/66 (BP Location: Left Arm, Patient Position: Sitting, Cuff Size: Normal)    Pulse 72    Ht 5\' 4"  (1.626 m)    Wt 106 lb 6.4 oz (48.3 kg)    SpO2 99%    BMI 18.26 kg/m  Wt Readings from Last 5 Encounters:  09/14/19 106 lb 6.4 oz (48.3 kg)  08/24/19 110 lb 6.4 oz (50.1 kg)  07/26/19 110 lb (49.9 kg)  07/13/19 110 lb (49.9 kg)  06/22/19 108 lb 9.6 oz (49.3 kg)   General: Well Developed, well  nourished, and in no acute distress.  Neuro/Psych: Alert and oriented x3, extra-ocular muscles intact, able to move all 4 extremities, sensation grossly intact. Skin: Warm and dry, no rashes noted.  Respiratory: Not using accessory muscles, speaking in full sentences, trachea midline.  Cardiovascular: Pulses palpable, no extremity edema. Abdomen: Does not appear distended. MSK:  L-spine: Nontender to spinal midline.  Nontender lumbar paraspinal musculature. Normal lumbar motion. Lower extremity strength reflexes and sensation are equal and normal throughout bilateral extremities.  Pelvis: Tender palpation left medial posterior gluteus or inferior SI joint region. Normal hip motion.     Lab and Radiology Results  X-ray images L-spine and sacrum obtained today personally and independently reviewed.  L-spine: Intact surgical hardware and fusion at L4-L5 not significantly changed from appearance on MRI dated August 2019.  No acute fractures.  Mild degenerative changes elsewhere.  Sacrum and pelvis: No acute fractures.  No lytic lesions.  No significant SI joint DJD.  Await formal radiology review.    MRI L-spine 06/21/18  EXAM: MRI LUMBAR SPINE WITHOUT CONTRAST  TECHNIQUE: Multiplanar, multisequence MR imaging of the lumbar spine was performed. No intravenous contrast was administered.  COMPARISON:  None.  FINDINGS: Segmentation: Assumed standard. The last well-formed disc space is designated L5-S1 for the purposes of this report.  Alignment: 4 mm anterolisthesis at L4-L5. 2 mm anterolisthesis at L5-S1.  Vertebrae: No fracture, evidence of discitis, or bone lesion. Prior L4-L5 PLIF. Pedicle screws approach the superior endplates.  Conus medullaris and cauda equina: Conus extends to the L1 level. Conus and cauda equina appear normal.  Paraspinal and other soft tissues: Negative.  Disc levels:  T12-L1:  Tiny right paracentral disc protrusion.  No  stenosis.  L1-L2:  Negative.  L2-L3: Trace diffuse disc bulge. Superimposed left foraminal disc extrusion migrating superiorly and impinging on the exiting left L2 nerve root. Moderate left neuroforaminal stenosis. No spinal canal or right neuroforaminal stenosis.  L3-L4: Small diffuse disc bulge and mild bilateral facet arthropathy. Mild left neuroforaminal stenosis. No spinal canal or right neuroforaminal stenosis.  L4-L5: Prior fusion. T1 hypointense soft tissue surrounding the descending left L5 nerve root in the lateral recess, likely scar. No residual stenosis.  L5-S1: Small posterior disc bulge, eccentric to the right. Mild left greater than right facet arthropathy. No stenosis.  IMPRESSION: 1. Left foraminal disc extrusion at L2-L3 impinging on the exiting left L2 nerve root. 2. Prior L4-L5 fusion with likely scar surrounding the descending left L5 nerve root in the lateral recess. 3. Additional mild degenerative changes of the lumbar spine as described above. 4. Mild anterolisthesis at L4-L5 and L5-S1.   Electronically Signed   By: Obie Dredge M.D.   On: 06/21/2018 13:12  I personally (independently) visualized and performed the interpretation of the images attached in this note.    Assessment and Plan: 68 y.o. female with  Left leg paresthesias and weakness occurring intermittently over the last month.  Most likely explanation as left L3 nerve root intermittent impingement.  This would cause paresthesias or pain to the left anterior thigh as well as quadricep weakness likely resulting in falls.  Patient need referral to physical therapy.  We will treat with home exercise program with exercises taught by ATC.  Additionally will proceed with some watchful waiting.  Discussed safety at home including using a walker when she first gets up in the morning.  Will complete work-up including x-rays as above as well as metabolic work-up listed below.  Recheck  back in near future if not improving.  Next step if not improving would likely be lumbar MRI.  Potentially nerve conduction study as well once we are past 6 weeks symptom onset.   PDMP reviewed during this encounter. Orders Placed This Encounter  Procedures   DG Lumbar Spine Complete    Standing Status:   Future    Number of Occurrences:   1    Standing Expiration Date:   11/13/2020    Order Specific Question:   Reason for Exam (SYMPTOM  OR DIAGNOSIS REQUIRED)    Answer:   eval pain lspine    Order Specific Question:   Preferred imaging location?    Answer:   [facility]    Order Specific Question:   Radiology Contrast Protocol - do NOT remove file path    Answer:   \[email or web address]   DG Sacrum/Coccyx    Standing Status:   Future    Number of Occurrences:   1    Standing Expiration Date:   11/13/2020    Order Specific Question:   Reason for Exam (SYMPTOM  OR DIAGNOSIS REQUIRED)    Answer:   eval left SI pain  Order Specific Question:   Preferred imaging location?    Answer:   [place] [facility]    Order Specific Question:   Radiology Contrast Protocol - do NOT remove file path    Answer:   \[email or web address]   CBC with Differential/Platelet    Standing Status:   Future    Number of Occurrences:   1    Standing Expiration Date:   09/13/2020   CK    Standing Status:   Future    Number of Occurrences:   1    Standing Expiration Date:   09/13/2020   Comprehensive metabolic panel    Standing Status:   Future    Number of Occurrences:   1    Standing Expiration Date:   09/13/2020   TSH    Standing Status:   Future    Number of Occurrences:   1    Standing Expiration Date:   09/13/2020   Vitamin B12    Standing Status:   Future    Number of Occurrences:   1    Standing Expiration Date:   09/13/2020   No orders of the defined types were placed in this encounter.   Historical  information moved to improve visibility of documentation.  Past Medical History:  Diagnosis Date   B12 deficiency, Rx B12 injections 07/05/2017   Chronic back pain    Chronic right shoulder pain 05/30/2017   Fibromyalgia    HLD (hyperlipidemia) 05/30/2017   Past Surgical History:  Procedure Laterality Date   LUMBAR FUSION     L4-L5   Social History   Tobacco Use   Smoking status: Former Smoker   Smokeless tobacco: Never Used  Substance Use Topics   Alcohol use: Yes    Alcohol/week: 7.0 standard drinks    Types: 7 Glasses of wine per week   Family history is unknown by patient.  Medications: Current Outpatient Medications  Medication Sig Dispense Refill   albuterol (PROVENTIL HFA;VENTOLIN HFA) 108 (90 Base) MCG/ACT inhaler INHALE 2 PUFFS INTO THE LUNGS EVERY 6 HOURS AS NEEDED FOR WHEEZING OR FOR SHORTNESS OF BREATH 1 Inhaler 3   atorvastatin (LIPITOR) 20 MG tablet      cyanocobalamin (,VITAMIN B-12,) 1000 MCG/ML injection 1000 mcg (1 mg) injection once every other week 6 mL 3   Syringe/Needle, Disp, (SYRINGE 3CC/25GX1") 25G X 1" 3 ML MISC 1 application by Does not apply route once a week. 12 each 3   tretinoin (RETIN-A) 0.1 % cream Apply topically at bedtime. 45 g 2   buprenorphine-naloxone (SUBOXONE) 8-2 mg SUBL SL tablet Place 1 tablet under the tongue daily. 1/2 tab four times a day     No current facility-administered medications for this visit.    Allergies  Allergen Reactions   Ambien [Zolpidem Tartrate]    Flexeril [Cyclobenzaprine] Other (See Comments)    Patient unsure of reaction   Naproxen Other (See Comments)    Patient unsure of reaction.      Discussed warning signs or symptoms. Please see discharge instructions. Patient expresses understanding.   The above documentation has been reviewed and is accurate and complete [name]

## 2019-09-15 ENCOUNTER — Encounter: Payer: Self-pay | Admitting: Family Medicine

## 2019-09-15 ENCOUNTER — Telehealth: Payer: Self-pay | Admitting: Physical Therapy

## 2019-09-15 DIAGNOSIS — M797 Fibromyalgia: Secondary | ICD-10-CM | POA: Diagnosis not present

## 2019-09-15 DIAGNOSIS — M130 Polyarthritis, unspecified: Secondary | ICD-10-CM | POA: Diagnosis not present

## 2019-09-15 DIAGNOSIS — Z9189 Other specified personal risk factors, not elsewhere classified: Secondary | ICD-10-CM | POA: Diagnosis not present

## 2019-09-15 NOTE — Progress Notes (Signed)
Liver and kidney labs look normal. CK a lab that represents muscle inflammation is elevated a bit. B12 is elevated which makes sense if you just give yourself a B12 shot. Thyroid test is normal. White blood cell count is very minimally elevated that probably does not represent any abnormality or problem.  Recommend recheck CK in a few weeks.

## 2019-09-15 NOTE — Telephone Encounter (Signed)
Called gave patient all information. Dr. Birdie Riddle was not taking new patient but Dr Juleen China suggested Dr. Alden Hipp at [place]. Informed we do not do blood type here she can [place] hospital [place] that did surgery. She will call if an issues.

## 2019-09-15 NOTE — Progress Notes (Signed)
X-ray pelvis shows evidence of possible old healed now fracture.  No acute fracture or significant arthritis present.

## 2019-09-15 NOTE — Progress Notes (Signed)
X-ray lumbar spine shows stable appearing fusion and surgical hardware.  No other severe changes or significant findings are present.

## 2019-09-15 NOTE — Telephone Encounter (Signed)
See below

## 2019-09-15 NOTE — Telephone Encounter (Signed)
Pt called about status of transfer to Dr. Birdie Riddle I advised Pt that Dr. Juleen China would speak with Dr. Birdie Riddle and someone will get back with her once they have an answer/ Pt also asked for her Blood Type/ please advise

## 2019-09-15 NOTE — Telephone Encounter (Signed)
See note

## 2019-09-15 NOTE — Telephone Encounter (Signed)
Called pt to discuss her XR results and she asked about her elevated CK results.  Based on CK level of 429 w/ a normal range of 7-177, pt is concerned that this level is elevated more than slightly.  She would like more of an explanation regarding this particular lab and what the elevation means other than muscle inflammation.

## 2019-09-16 ENCOUNTER — Telehealth: Payer: Self-pay | Admitting: Family Medicine

## 2019-09-16 DIAGNOSIS — R748 Abnormal levels of other serum enzymes: Secondary | ICD-10-CM

## 2019-09-16 DIAGNOSIS — M3500 Sicca syndrome, unspecified: Secondary | ICD-10-CM

## 2019-09-16 DIAGNOSIS — Z5181 Encounter for therapeutic drug level monitoring: Secondary | ICD-10-CM

## 2019-09-16 DIAGNOSIS — R202 Paresthesia of skin: Secondary | ICD-10-CM

## 2019-09-16 NOTE — Telephone Encounter (Signed)
I had a conversation with Patricia Underwood today.  We discussed her positive mildly elevated CK.  Plan to repeat CK and check sed rate in about 2 weeks.  Additionally we will go ahead and check ABO and Rh blood type this patient is interested in knowing this for COVID-19.  Will keep patient updated and proceed with neck steps if not improving.

## 2019-09-16 NOTE — Telephone Encounter (Signed)
Called pt and she states that she would like to have a phone visit w/ Dr. Georgina Snell which she will have today at 11:20 am.

## 2019-09-16 NOTE — Telephone Encounter (Signed)
CK level at 400 without other labs being also significantly elevated is not nearly as concerning as if it were 1000 or higher.  My plan is to repeat this lab in the near future along with some other labs to follow this up further.  I do think that we should take this seriously but I am not convinced that the elevated CK is the explanation for your pain.  If you like to discuss this further at happy to arrange time to have a dedicated phone or video visit with you.

## 2019-09-17 DIAGNOSIS — G8929 Other chronic pain: Secondary | ICD-10-CM | POA: Diagnosis not present

## 2019-09-17 DIAGNOSIS — M797 Fibromyalgia: Secondary | ICD-10-CM | POA: Diagnosis not present

## 2019-09-17 DIAGNOSIS — M545 Low back pain: Secondary | ICD-10-CM | POA: Diagnosis not present

## 2019-09-17 DIAGNOSIS — M130 Polyarthritis, unspecified: Secondary | ICD-10-CM | POA: Diagnosis not present

## 2019-09-27 DIAGNOSIS — B351 Tinea unguium: Secondary | ICD-10-CM | POA: Diagnosis not present

## 2019-09-27 DIAGNOSIS — L03031 Cellulitis of right toe: Secondary | ICD-10-CM | POA: Diagnosis not present

## 2019-10-11 ENCOUNTER — Telehealth: Payer: Self-pay | Admitting: *Deleted

## 2019-10-11 NOTE — Telephone Encounter (Signed)
Copied from [facility] [id number]. Topic: General - Inquiry >> Oct 10, 2019  5:46 PM Alease Frame wrote: Reason for CRM: Patient would like to just keep appt for [phone] instead of trying for an earlier appt .   Noted

## 2019-10-13 DIAGNOSIS — M130 Polyarthritis, unspecified: Secondary | ICD-10-CM | POA: Diagnosis not present

## 2019-10-13 DIAGNOSIS — Z79899 Other long term (current) drug therapy: Secondary | ICD-10-CM | POA: Diagnosis not present

## 2019-10-13 DIAGNOSIS — T85848S Pain due to other internal prosthetic devices, implants and grafts, sequela: Secondary | ICD-10-CM | POA: Diagnosis not present

## 2019-10-13 DIAGNOSIS — M797 Fibromyalgia: Secondary | ICD-10-CM | POA: Diagnosis not present

## 2019-10-20 ENCOUNTER — Other Ambulatory Visit: Payer: Self-pay

## 2019-10-21 ENCOUNTER — Ambulatory Visit (INDEPENDENT_AMBULATORY_CARE_PROVIDER_SITE_OTHER): Payer: Medicare Other | Admitting: Internal Medicine

## 2019-10-21 ENCOUNTER — Encounter: Payer: Self-pay | Admitting: Internal Medicine

## 2019-10-21 VITALS — BP 122/60 | HR 75 | Temp 97.9°F | Wt 106.4 lb

## 2019-10-21 DIAGNOSIS — M3500 Sicca syndrome, unspecified: Secondary | ICD-10-CM

## 2019-10-21 DIAGNOSIS — E538 Deficiency of other specified B group vitamins: Secondary | ICD-10-CM | POA: Diagnosis not present

## 2019-10-21 DIAGNOSIS — K029 Dental caries, unspecified: Secondary | ICD-10-CM

## 2019-10-21 DIAGNOSIS — R1319 Other dysphagia: Secondary | ICD-10-CM

## 2019-10-21 DIAGNOSIS — R1310 Dysphagia, unspecified: Secondary | ICD-10-CM

## 2019-10-21 DIAGNOSIS — F5102 Adjustment insomnia: Secondary | ICD-10-CM

## 2019-10-21 DIAGNOSIS — G894 Chronic pain syndrome: Secondary | ICD-10-CM

## 2019-10-21 DIAGNOSIS — K219 Gastro-esophageal reflux disease without esophagitis: Secondary | ICD-10-CM

## 2019-10-21 DIAGNOSIS — R7989 Other specified abnormal findings of blood chemistry: Secondary | ICD-10-CM

## 2019-10-21 DIAGNOSIS — E782 Mixed hyperlipidemia: Secondary | ICD-10-CM

## 2019-10-21 DIAGNOSIS — H903 Sensorineural hearing loss, bilateral: Secondary | ICD-10-CM

## 2019-10-21 MED ORDER — PANTOPRAZOLE SODIUM 40 MG PO TBEC: 40.0000 mg | DELAYED_RELEASE_TABLET | Freq: Two times a day (BID) | ORAL | 2 refills | Status: DC

## 2019-10-21 NOTE — Progress Notes (Signed)
Established Patient Office Visit     This visit occurred during the SARS-CoV-2 public health emergency.  Safety protocols were in place, including screening questions prior to the visit, additional usage of staff PPE, and extensive cleaning of exam room while observing appropriate contact time as indicated for disinfecting solutions.    CC/Reason for Visit: Establish care, discuss chronic medical conditions, discuss some acute concerns  HPI: Patricia Underwood is a 68 y.o. female who is coming in today for the above mentioned reasons. Past Medical History is significant for: Hyperlipidemia not on Lipitor currently although she has been in the past, history of vitamin D deficiency currently not on supplementation, history of insomnia for which she had taken as needed lorazepam in the past but feels like this does not help, hearing loss for which she wears hearing aids.  She also has a history of chronic pain syndrome and has been seeing the [facility] for pain management she is receiving oxycodone 10 mg every 4 hours.  She is wondering about switching her prescription for chronic narcotics over to me to avoid multiple office visits.  She believes she may have Sjogren's syndrome.  She has been dealing with bad teeth, dry mouth and dry eyes for many years.  At one point she was on pilocarpine.  She has never seen a rheumatologist.  Her main concern today is a burning sensation in her chest and upper throat when she is eating, she has had significant weight loss, she feels like food gets stuck in her throat and then she has regurgitation shortly after eating.  She feels like liquids pass better than solids.  She is worried about a possible ulcer.  She states many years ago she had an EGD and was told there were "precancerous cells".  Her prior PCP attempted to put her on PPIs but she took herself off of them, her symptoms have worsened off PPI therapy.   Past Medical/Surgical History: Past  Medical History:  Diagnosis Date  . B12 deficiency, Rx B12 injections 07/05/2017  . Chronic back pain   . Chronic right shoulder pain 05/30/2017  . Fibromyalgia   . HLD (hyperlipidemia) 05/30/2017    Past Surgical History:  Procedure Laterality Date  . LUMBAR FUSION     L4-L5    Social History:  reports that she has quit smoking. She has never used smokeless tobacco. She reports current alcohol use of about 7.0 standard drinks of alcohol per week. She reports that she does not use drugs.  Allergies: Allergies  Allergen Reactions  . Ambien [Zolpidem Tartrate]   . Flexeril [Cyclobenzaprine] Other (See Comments)    Patient unsure of reaction  . Naproxen Other (See Comments)    Patient unsure of reaction.    Family History:  Family History  Family history unknown: Yes     Current Outpatient Medications:  .  albuterol (PROVENTIL HFA;VENTOLIN HFA) 108 (90 Base) MCG/ACT inhaler, INHALE 2 PUFFS INTO THE LUNGS EVERY 6 HOURS AS NEEDED FOR WHEEZING OR FOR SHORTNESS OF BREATH, Disp: 1 Inhaler, Rfl: 3 .  atorvastatin (LIPITOR) 20 MG tablet, , Disp: , Rfl:  .  buprenorphine-naloxone (SUBOXONE) 8-2 mg SUBL SL tablet, Place 1 tablet under the tongue daily. 1/2 tab four times a day, Disp: , Rfl:  .  cyanocobalamin (,VITAMIN B-12,) 1000 MCG/ML injection, 1000 mcg (1 mg) injection once every other week, Disp: 6 mL, Rfl: 3 .  Syringe/Needle, Disp, (SYRINGE 3CC/25GX1") 25G X 1" 3 ML MISC,  1 application by Does not apply route once a week., Disp: 12 each, Rfl: 3 .  tretinoin (RETIN-A) 0.1 % cream, Apply topically at bedtime., Disp: 45 g, Rfl: 2 .  pantoprazole (PROTONIX) 40 MG tablet, Take 1 tablet (40 mg total) by mouth 2 (two) times daily., Disp: 60 tablet, Rfl: 2  Review of Systems:  Constitutional: Denies fever, chills, diaphoresis, appetite change and fatigue.  HEENT: Denies photophobia, eye pain, redness, hearing loss, ear pain, congestion, sore throat, rhinorrhea, sneezing, mouth sores,  trouble swallowing, neck pain, neck stiffness and tinnitus.   Respiratory: Denies SOB, DOE, cough, chest tightness,  and wheezing.   Cardiovascular: Denies chest pain, palpitations and leg swelling.  Gastrointestinal: Denies abdominal pain, diarrhea, constipation, blood in stool and abdominal distention.  Genitourinary: Denies dysuria, urgency, frequency, hematuria, flank pain and difficulty urinating.  Endocrine: Denies: hot or cold intolerance, sweats, changes in hair or nails, polyuria, polydipsia. Musculoskeletal: Denies myalgias, back pain, joint swelling, arthralgias and gait problem.  Skin: Denies pallor, rash and wound.  Neurological: Denies dizziness, seizures, syncope, weakness, light-headedness, numbness and headaches.  Hematological: Denies adenopathy. Easy bruising, personal or family bleeding history  Psychiatric/Behavioral: Denies suicidal ideation, mood changes, confusion, nervousness and agitation    Physical Exam: Vitals:   10/21/19 1037  BP: 122/60  Pulse: 75  Temp: 97.9 F (36.6 C)  TempSrc: Temporal  SpO2: 98%  Weight: 106 lb 6.4 oz (48.3 kg)    Body mass index is 18.26 kg/m.   Constitutional: NAD, calm, comfortable Eyes: PERRL, lids and conjunctivae normal ENMT: Mucous membranes are moist.  Respiratory: clear to auscultation bilaterally, no wheezing, no crackles. Normal respiratory effort. No accessory muscle use.  Cardiovascular: Regular rate and rhythm, no murmurs / rubs / gallops. No extremity edema. 2+ pedal pulses.  Abdomen: no tenderness, no masses palpated. No hepatosplenomegaly. Bowel sounds positive.  Musculoskeletal: no clubbing / cyanosis. No joint deformity upper and lower extremities. Good ROM, no contractures. Normal muscle tone.  Skin: no rashes, lesions, ulcers. No induration Neurologic: Grossly intact and nonfocal Psychiatric: Normal judgment and insight. Alert and oriented x 3. Normal mood.    Impression and Plan:  Sicca syndrome,  unspecified (HCC)  Dental caries  -I am not sure that she has Sjogren's syndrome, I suspect that a lot of her symptoms including her dental cavities and her dry mouth could be due to untreated reflux disorder. -At patient's insistence, we will send in a referral to rheumatology.  Chronic pain syndrome -I have agreed to take on her chronic narcotic prescription as long as her pain requirement needs do not increase. -She has enough pain medication for now. -Next visit she will need to sign pain contract with me.  B12 deficiency, Rx B12 injections -Check B12 level when she returns for her physical.  Mixed hyperlipidemia -Last LDL on file was 96 in October 2019. -Check lipids when she returns for physical to determine if she needs to go back on statins.  Adjustment insomnia -She is not interested in resuming her prescription for lorazepam, wonders what other medications exist. -We have discussed trazodone and Ambien, she will do her research and let me know at next visit but she would like to restart them. -She has tried melatonin and sleep hygiene techniques without relief.  Sensorineural hearing loss (SNHL) of both ears, followed by [facility], [place] [facility] -Noted  Low vitamin D level -Check vitamin D levels when she returns for her physical before the end of the month.  Gastroesophageal  reflux disease without esophagitis  Esophageal dysphagia -Her symptoms sound remarkably like advanced reflux possibly causing chronic esophagitis and esophageal narrowing. -I am concerned that the prior noted "precancerous cells on EGD" are probably Barrett's esophagus she has not had a follow-up EGD. -She also has significant weight loss and esophageal dysphagia so I believe referral to GI is imperative in addition to starting twice daily PPI therapy. -She would like to research GI doctors in the area.  Have given her the names of the different GI groups in town and she will let me know when  she returns in 2 weeks who she would like to refer to.     Patient Instructions  -Nice seeing you today!!  -Start Protonix 40 mg twice daily. Let me know about your GI preference for the referral.  -Schedule follow up for your wellness visit at your convenience.   Gastroesophageal Reflux Disease, Adult Gastroesophageal reflux (GER) happens when acid from the stomach flows up into the tube that connects the mouth and the stomach (esophagus). Normally, food travels down the esophagus and stays in the stomach to be digested. With GER, food and stomach acid sometimes move back up into the esophagus. You may have a disease called gastroesophageal reflux disease (GERD) if the reflux:  Happens often.  Causes frequent or very bad symptoms.  Causes problems such as damage to the esophagus. When this happens, the esophagus becomes sore and swollen (inflamed). Over time, GERD can make small holes (ulcers) in the lining of the esophagus. What are the causes? This condition is caused by a problem with the muscle between the esophagus and the stomach. When this muscle is weak or not normal, it does not close properly to keep food and acid from coming back up from the stomach. The muscle can be weak because of:  Tobacco use.  Pregnancy.  Having a certain type of hernia (hiatal hernia).  Alcohol use.  Certain foods and drinks, such as coffee, chocolate, onions, and peppermint. What increases the risk? You are more likely to develop this condition if you:  Are overweight.  Have a disease that affects your connective tissue.  Use NSAID medicines. What are the signs or symptoms? Symptoms of this condition include:  Heartburn.  Difficult or painful swallowing.  The feeling of having a lump in the throat.  A bitter taste in the mouth.  Bad breath.  Having a lot of saliva.  Having an upset or bloated stomach.  Belching.  Chest pain. Different conditions can cause chest pain.  Make sure you see your doctor if you have chest pain.  Shortness of breath or noisy breathing (wheezing).  Ongoing (chronic) cough or a cough at night.  Wearing away of the surface of teeth (tooth enamel).  Weight loss. How is this treated? Treatment will depend on how bad your symptoms are. Your doctor may suggest:  Changes to your diet.  Medicine.  Surgery. Follow these instructions at home: Eating and drinking   Follow a diet as told by your doctor. You may need to avoid foods and drinks such as: ? Coffee and tea (with or without caffeine). ? Drinks that contain alcohol. ? Energy drinks and sports drinks. ? Bubbly (carbonated) drinks or sodas. ? Chocolate and cocoa. ? Peppermint and mint flavorings. ? Garlic and onions. ? Horseradish. ? Spicy and acidic foods. These include peppers, chili powder, curry powder, vinegar, hot sauces, and BBQ sauce. ? Citrus fruit juices and citrus fruits, such as oranges, lemons, and  limes. ? Tomato-based foods. These include red sauce, chili, salsa, and pizza with red sauce. ? Fried and fatty foods. These include donuts, french fries, potato chips, and high-fat dressings. ? High-fat meats. These include hot dogs, rib eye steak, sausage, ham, and bacon. ? High-fat dairy items, such as whole milk, butter, and cream cheese.  Eat small meals often. Avoid eating large meals.  Avoid drinking large amounts of liquid with your meals.  Avoid eating meals during the 2-3 hours before bedtime.  Avoid lying down right after you eat.  Do not exercise right after you eat. Lifestyle   Do not use any products that contain nicotine or tobacco. These include cigarettes, e-cigarettes, and chewing tobacco. If you need help quitting, ask your doctor.  Try to lower your stress. If you need help doing this, ask your doctor.  If you are overweight, lose an amount of weight that is healthy for you. Ask your doctor about a safe weight loss goal. General  instructions  Pay attention to any changes in your symptoms.  Take over-the-counter and prescription medicines only as told by your doctor. Do not take aspirin, ibuprofen, or other NSAIDs unless your doctor says it is okay.  Wear loose clothes. Do not wear anything tight around your waist.  Raise (elevate) the head of your bed about 6 inches (15 cm).  Avoid bending over if this makes your symptoms worse.  Keep all follow-up visits as told by your doctor. This is important. Contact a doctor if:  You have new symptoms.  You lose weight and you do not know why.  You have trouble swallowing or it hurts to swallow.  You have wheezing or a cough that keeps happening.  Your symptoms do not get better with treatment.  You have a hoarse voice. Get help right away if:  You have pain in your arms, neck, jaw, teeth, or back.  You feel sweaty, dizzy, or light-headed.  You have chest pain or shortness of breath.  You throw up (vomit) and your throw-up looks like blood or coffee grounds.  You pass out (faint).  Your poop (stool) is bloody or black.  You cannot swallow, drink, or eat. Summary  If a person has gastroesophageal reflux disease (GERD), food and stomach acid move back up into the esophagus and cause symptoms or problems such as damage to the esophagus.  Treatment will depend on how bad your symptoms are.  Follow a diet as told by your doctor.  Take all medicines only as told by your doctor. This information is not intended to replace advice given to you by your health care provider. Make sure you discuss any questions you have with your health care provider. Document Released: 04/14/2008 Document Revised: 05/05/2018 Document Reviewed: 05/05/2018 Elsevier Patient Education  2020 [employer]      Chaya Jan, MD [facility]

## 2019-10-21 NOTE — Patient Instructions (Signed)
-Nice seeing you today!!  -Start Protonix 40 mg twice daily. Let me know about your GI preference for the referral.  -Schedule follow up for your wellness visit at your convenience.   Gastroesophageal Reflux Disease, Adult Gastroesophageal reflux (GER) happens when acid from the stomach flows up into the tube that connects the mouth and the stomach (esophagus). Normally, food travels down the esophagus and stays in the stomach to be digested. With GER, food and stomach acid sometimes move back up into the esophagus. You may have a disease called gastroesophageal reflux disease (GERD) if the reflux:  Happens often.  Causes frequent or very bad symptoms.  Causes problems such as damage to the esophagus. When this happens, the esophagus becomes sore and swollen (inflamed). Over time, GERD can make small holes (ulcers) in the lining of the esophagus. What are the causes? This condition is caused by a problem with the muscle between the esophagus and the stomach. When this muscle is weak or not normal, it does not close properly to keep food and acid from coming back up from the stomach. The muscle can be weak because of:  Tobacco use.  Pregnancy.  Having a certain type of hernia (hiatal hernia).  Alcohol use.  Certain foods and drinks, such as coffee, chocolate, onions, and peppermint. What increases the risk? You are more likely to develop this condition if you:  Are overweight.  Have a disease that affects your connective tissue.  Use NSAID medicines. What are the signs or symptoms? Symptoms of this condition include:  Heartburn.  Difficult or painful swallowing.  The feeling of having a lump in the throat.  A bitter taste in the mouth.  Bad breath.  Having a lot of saliva.  Having an upset or bloated stomach.  Belching.  Chest pain. Different conditions can cause chest pain. Make sure you see your doctor if you have chest pain.  Shortness of breath or noisy  breathing (wheezing).  Ongoing (chronic) cough or a cough at night.  Wearing away of the surface of teeth (tooth enamel).  Weight loss. How is this treated? Treatment will depend on how bad your symptoms are. Your doctor may suggest:  Changes to your diet.  Medicine.  Surgery. Follow these instructions at home: Eating and drinking   Follow a diet as told by your doctor. You may need to avoid foods and drinks such as: ? Coffee and tea (with or without caffeine). ? Drinks that contain alcohol. ? Energy drinks and sports drinks. ? Bubbly (carbonated) drinks or sodas. ? Chocolate and cocoa. ? Peppermint and mint flavorings. ? Garlic and onions. ? Horseradish. ? Spicy and acidic foods. These include peppers, chili powder, curry powder, vinegar, hot sauces, and BBQ sauce. ? Citrus fruit juices and citrus fruits, such as oranges, lemons, and limes. ? Tomato-based foods. These include red sauce, chili, salsa, and pizza with red sauce. ? Fried and fatty foods. These include donuts, french fries, potato chips, and high-fat dressings. ? High-fat meats. These include hot dogs, rib eye steak, sausage, ham, and bacon. ? High-fat dairy items, such as whole milk, butter, and cream cheese.  Eat small meals often. Avoid eating large meals.  Avoid drinking large amounts of liquid with your meals.  Avoid eating meals during the 2-3 hours before bedtime.  Avoid lying down right after you eat.  Do not exercise right after you eat. Lifestyle   Do not use any products that contain nicotine or tobacco. These include cigarettes, e-cigarettes,  and chewing tobacco. If you need help quitting, ask your doctor.  Try to lower your stress. If you need help doing this, ask your doctor.  If you are overweight, lose an amount of weight that is healthy for you. Ask your doctor about a safe weight loss goal. General instructions  Pay attention to any changes in your symptoms.  Take over-the-counter  and prescription medicines only as told by your doctor. Do not take aspirin, ibuprofen, or other NSAIDs unless your doctor says it is okay.  Wear loose clothes. Do not wear anything tight around your waist.  Raise (elevate) the head of your bed about 6 inches (15 cm).  Avoid bending over if this makes your symptoms worse.  Keep all follow-up visits as told by your doctor. This is important. Contact a doctor if:  You have new symptoms.  You lose weight and you do not know why.  You have trouble swallowing or it hurts to swallow.  You have wheezing or a cough that keeps happening.  Your symptoms do not get better with treatment.  You have a hoarse voice. Get help right away if:  You have pain in your arms, neck, jaw, teeth, or back.  You feel sweaty, dizzy, or light-headed.  You have chest pain or shortness of breath.  You throw up (vomit) and your throw-up looks like blood or coffee grounds.  You pass out (faint).  Your poop (stool) is bloody or black.  You cannot swallow, drink, or eat. Summary  If a person has gastroesophageal reflux disease (GERD), food and stomach acid move back up into the esophagus and cause symptoms or problems such as damage to the esophagus.  Treatment will depend on how bad your symptoms are.  Follow a diet as told by your doctor.  Take all medicines only as told by your doctor. This information is not intended to replace advice given to you by your health care provider. Make sure you discuss any questions you have with your health care provider. Document Released: 04/14/2008 Document Revised: 05/05/2018 Document Reviewed: 05/05/2018 Elsevier Patient Education  2020 [employer].

## 2019-10-26 ENCOUNTER — Telehealth: Payer: Self-pay | Admitting: Internal Medicine

## 2019-10-26 DIAGNOSIS — Z1211 Encounter for screening for malignant neoplasm of colon: Secondary | ICD-10-CM

## 2019-10-26 NOTE — Telephone Encounter (Signed)
Referral placed.

## 2019-10-26 NOTE — Telephone Encounter (Signed)
Is it okay to place referral? 

## 2019-10-26 NOTE — Telephone Encounter (Signed)
Copied from [place] [phone]. Topic: General - Other >> Oct 26, 2019 12:02 PM Keene Breath wrote: Reason for CRM: Patient is calling regarding a GI referral. Patient would like to be seen by [facility], Dr. Barnet Glasgow.  CB# [phone]

## 2019-10-26 NOTE — Telephone Encounter (Signed)
Yes

## 2019-11-02 ENCOUNTER — Other Ambulatory Visit: Payer: Self-pay

## 2019-11-03 ENCOUNTER — Encounter: Payer: Self-pay | Admitting: Internal Medicine

## 2019-11-03 ENCOUNTER — Ambulatory Visit (INDEPENDENT_AMBULATORY_CARE_PROVIDER_SITE_OTHER): Payer: Medicare Other | Admitting: Internal Medicine

## 2019-11-03 ENCOUNTER — Other Ambulatory Visit: Payer: Self-pay

## 2019-11-03 VITALS — BP 110/70 | HR 93 | Temp 97.6°F | Ht 64.0 in | Wt 108.0 lb

## 2019-11-03 DIAGNOSIS — F5102 Adjustment insomnia: Secondary | ICD-10-CM

## 2019-11-03 DIAGNOSIS — G894 Chronic pain syndrome: Secondary | ICD-10-CM

## 2019-11-03 MED ORDER — OXYCODONE HCL 10 MG PO TABS: 10.0000 mg | ORAL_TABLET | ORAL | 0 refills | Status: DC | PRN

## 2019-11-03 MED ORDER — TRAZODONE HCL 50 MG PO TABS: 25.0000 mg | ORAL_TABLET | Freq: Every evening | ORAL | 1 refills | Status: DC | PRN

## 2019-11-03 NOTE — Progress Notes (Signed)
Established Patient Office Visit     This visit occurred during the SARS-CoV-2 public health emergency.  Safety protocols were in place, including screening questions prior to the visit, additional usage of staff PPE, and extensive cleaning of exam room while observing appropriate contact time as indicated for disinfecting solutions.    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  She has an extensive past medical history, please see note from earlier this month detailing.  I have agreed to take over her chronic narcotic pain regimen which consists of oxycodone 10 mg every 4 hours.  She will sign pain contract with me today.  She would also like to try trazodone for insomnia.   Past Medical/Surgical History: Past Medical History:  Diagnosis Date  . B12 deficiency, Rx B12 injections 07/05/2017  . Chronic back pain   . Chronic right shoulder pain 05/30/2017  . Fibromyalgia   . HLD (hyperlipidemia) 05/30/2017    Past Surgical History:  Procedure Laterality Date  . ABDOMINAL HYSTERECTOMY    . KNEE SURGERY Right   . LUMBAR FUSION     L4-L5  . SHOULDER ARTHROSCOPY Right     Social History:  reports that she has quit smoking. She has never used smokeless tobacco. She reports current alcohol use of about 7.0 standard drinks of alcohol per week. She reports that she does not use drugs.  Allergies: Allergies  Allergen Reactions  . Ambien [Zolpidem Tartrate]   . Flexeril [Cyclobenzaprine] Other (See Comments)    Patient unsure of reaction  . Naproxen Other (See Comments)    Patient unsure of reaction.    Family History:  Family History  Family history unknown: Yes     Current Outpatient Medications:  .  albuterol (PROVENTIL HFA;VENTOLIN HFA) 108 (90 Base) MCG/ACT inhaler, INHALE 2 PUFFS INTO THE LUNGS EVERY 6 HOURS AS NEEDED FOR WHEEZING OR FOR SHORTNESS  OF BREATH, Disp: 1 Inhaler, Rfl: 3 .  cyanocobalamin (,VITAMIN B-12,) 1000 MCG/ML injection, 1000 mcg (1 mg) injection once every other week, Disp: 6 mL, Rfl: 3 .  LORazepam (ATIVAN) 2 MG tablet, Take 2 mg by mouth at bedtime as needed for anxiety., Disp: , Rfl:  .  OVER THE COUNTER MEDICATION, Formula 7 solution - for toenails, Disp: , Rfl:  .  Oxycodone HCl 10 MG TABS, Take 1 tablet (10 mg total) by mouth every 4 (four) hours as needed., Disp: 168 tablet, Rfl: 0 .  pantoprazole (PROTONIX) 40 MG tablet, Take 1 tablet (40 mg total) by mouth 2 (two) times daily., Disp: 60 tablet, Rfl: 2 .  pilocarpine (SALAGEN) 5 MG tablet, Take 5 mg by mouth. 3 times a week as needed, Disp: , Rfl:  .  Syringe/Needle, Disp, (SYRINGE 3CC/25GX1") 25G X 1" 3 ML MISC, 1 application by Does not apply route once a week., Disp: 12 each, Rfl: 3 .  tretinoin (RETIN-A) 0.1 % cream, Apply topically at bedtime. (Patient taking differently: Apply topically 3 (three) times daily. ), Disp: 45 g, Rfl: 2 .  atorvastatin (LIPITOR) 20 MG tablet, Take 20 mg by mouth at bedtime. , Disp: , Rfl:  .  Oxycodone HCl 10 MG TABS, Take 1 tablet (10 mg total) by mouth every 4 (four) hours as needed., Disp: 168 tablet, Rfl: 0 .  Oxycodone HCl 10 MG TABS, Take 1 tablet (10 mg total) by mouth every  4 (four) hours as needed., Disp: 168 tablet, Rfl: 0 .  traZODone (DESYREL) 50 MG tablet, Take 0.5-1 tablets (25-50 mg total) by mouth at bedtime as needed for sleep., Disp: 30 tablet, Rfl: 1  Review of Systems:  Constitutional: Denies fever, chills, diaphoresis, appetite change and fatigue.  HEENT: Denies photophobia, eye pain, redness, hearing loss, ear pain, congestion, sore throat, rhinorrhea, sneezing, mouth sores, trouble swallowing, neck pain, neck stiffness and tinnitus.   Respiratory: Denies SOB, DOE, cough, chest tightness,  and wheezing.   Cardiovascular: Denies chest pain, palpitations and leg swelling.  Gastrointestinal: Denies nausea,  vomiting, abdominal pain, diarrhea, constipation, blood in stool and abdominal distention.  Genitourinary: Denies dysuria, urgency, frequency, hematuria, flank pain and difficulty urinating.  Endocrine: Denies: hot or cold intolerance, sweats, changes in hair or nails, polyuria, polydipsia. Musculoskeletal: Denies myalgias, back pain, joint swelling, arthralgias and gait problem.  Skin: Denies pallor, rash and wound.  Neurological: Denies dizziness, seizures, syncope, weakness, light-headedness, numbness and headaches.  Hematological: Denies adenopathy. Easy bruising, personal or family bleeding history  Psychiatric/Behavioral: Denies suicidal ideation, mood changes, confusion, nervousness, sleep disturbance and agitation    Physical Exam: Vitals:   11/03/19 1052  BP: 110/70  Pulse: 93  Temp: 97.6 F (36.4 C)  TempSrc: Temporal  SpO2: 93%  Weight: 108 lb (49 kg)  Height: 5\' 4"  (1.626 m)    Body mass index is 18.54 kg/m.   Constitutional: NAD, calm, comfortable Eyes: PERRL, lids and conjunctivae normal, wears corrective lenses ENMT: Mucous membranes are moist.  Respiratory: clear to auscultation bilaterally, no wheezing, no crackles. Normal respiratory effort. No accessory muscle use.  Cardiovascular: Regular rate and rhythm, no murmurs / rubs / gallops. No extremity edema. 2+ pedal pulses. No carotid bruits.  Psychiatric: Normal judgment and insight. Alert and oriented x 3. Normal mood.    Impression and Plan:  Chronic pain syndrome   NCCSRS reviewed in EPIC   Indication for chronic opioid:  Chronic pain syndrome Medication and dose:  Oxycodone 10 mg every 4 hours as needed for pain # pills per month:  168 Last UDS date:  Check next office visit Opioid Treatment Agreement signed:  Yes Opioid Treatment Agreement last reviewed with patient:  11/03/2019 NCCSRS reviewed this encounter (include red flags):   Yes, overdose risk score is 480, presumably due to multiple recent  prescribers including previous PCP and then pain management specialist.  She is also now off Suboxone.  Would anticipate her ORS decreasing over the next few months.  Adjustment insomnia  - Plan: traZODone (DESYREL) 50 MG tablet    Patient Instructions  -Nice seeing you today!!  -Schedule follow up in 3 months for your annual wellness visit.     Lelon Frohlich, MD [facility] at [facility]

## 2019-11-03 NOTE — Patient Instructions (Signed)
-  Nice seeing you today!!  -Schedule follow up in 3 months for your annual wellness visit.

## 2019-12-06 DIAGNOSIS — M5136 Other intervertebral disc degeneration, lumbar region: Secondary | ICD-10-CM | POA: Diagnosis not present

## 2019-12-06 DIAGNOSIS — M797 Fibromyalgia: Secondary | ICD-10-CM | POA: Diagnosis not present

## 2019-12-06 DIAGNOSIS — M150 Primary generalized (osteo)arthritis: Secondary | ICD-10-CM | POA: Diagnosis not present

## 2019-12-06 DIAGNOSIS — R682 Dry mouth, unspecified: Secondary | ICD-10-CM | POA: Diagnosis not present

## 2019-12-06 DIAGNOSIS — M2550 Pain in unspecified joint: Secondary | ICD-10-CM | POA: Diagnosis not present

## 2019-12-09 ENCOUNTER — Ambulatory Visit: Payer: Medicare Other

## 2019-12-13 DIAGNOSIS — Z23 Encounter for immunization: Secondary | ICD-10-CM | POA: Diagnosis not present

## 2019-12-14 ENCOUNTER — Ambulatory Visit: Payer: Medicare Other

## 2019-12-17 ENCOUNTER — Ambulatory Visit: Payer: Medicare Other

## 2019-12-29 ENCOUNTER — Telehealth: Payer: Self-pay | Admitting: *Deleted

## 2019-12-29 DIAGNOSIS — G894 Chronic pain syndrome: Secondary | ICD-10-CM

## 2019-12-29 NOTE — Telephone Encounter (Signed)
Patient called after hours line. Patient reports  she needs a refill of her Oxycodone but the pharmacy won`t allow her to fill it due to it being a day earlier. Pt said the script was suppose to be for 30 days but the Dr. made a mistake and wrote it for 28. Pt also said she had her molers removed and implants placed yesterday. Caller is concerned that her pharmacy will close early due to the weather.

## 2019-12-30 ENCOUNTER — Other Ambulatory Visit: Payer: Self-pay | Admitting: Internal Medicine

## 2019-12-30 DIAGNOSIS — G894 Chronic pain syndrome: Secondary | ICD-10-CM

## 2019-12-30 MED ORDER — OXYCODONE HCL 10 MG PO TABS: 10.0000 mg | ORAL_TABLET | ORAL | 0 refills | Status: DC | PRN

## 2019-12-30 NOTE — Telephone Encounter (Signed)
Meds sent x 3 months

## 2020-01-03 DIAGNOSIS — Z23 Encounter for immunization: Secondary | ICD-10-CM | POA: Diagnosis not present

## 2020-01-17 ENCOUNTER — Telehealth: Payer: Self-pay | Admitting: Internal Medicine

## 2020-01-17 ENCOUNTER — Ambulatory Visit (INDEPENDENT_AMBULATORY_CARE_PROVIDER_SITE_OTHER): Payer: Medicare Other | Admitting: Family

## 2020-01-17 ENCOUNTER — Other Ambulatory Visit: Payer: Self-pay

## 2020-01-17 ENCOUNTER — Encounter: Payer: Self-pay | Admitting: Family

## 2020-01-17 VITALS — BP 150/90 | HR 110 | Temp 97.8°F | Wt 101.7 lb

## 2020-01-17 DIAGNOSIS — G894 Chronic pain syndrome: Secondary | ICD-10-CM

## 2020-01-17 DIAGNOSIS — M797 Fibromyalgia: Secondary | ICD-10-CM | POA: Diagnosis not present

## 2020-01-17 MED ORDER — PREDNISONE 20 MG PO TABS: 20.0000 mg | ORAL_TABLET | Freq: Two times a day (BID) | ORAL | 0 refills | Status: DC

## 2020-01-17 MED ORDER — OXYCODONE HCL 10 MG PO TABS: 10.0000 mg | ORAL_TABLET | ORAL | 0 refills | Status: DC | PRN

## 2020-01-17 NOTE — Telephone Encounter (Signed)
Spoke with pharmacist Marylene Land at [facility]. The prescription sent to day says Notes to Pharmacy: FILL IN ONE MONTH  The refill is due 01/26/20  The prescription sent in today has been denied.  Directions have been changed.  Please send to the pharmacy.  Thanks

## 2020-01-17 NOTE — Telephone Encounter (Signed)
Pt calling back to follow up. Pt left a message earlier today. Thanks

## 2020-01-17 NOTE — Patient Instructions (Signed)

## 2020-01-17 NOTE — Telephone Encounter (Signed)
Pt is calling about Oxycodone stating that she will be out of the medication before it can be refilled in April with what the Rx that was given to her today.  Pt would like to have a call back today please.

## 2020-01-17 NOTE — Telephone Encounter (Signed)
Pt called to check on this. Informed pt that Fleet Contras spoke to the pharmacist and have updated directions so we are waiting on it to be sent. Pt is inquiry about the new directions and would like a call back   [phone] -ok to leave detailed message at this number per pt

## 2020-01-18 MED ORDER — OXYCODONE HCL 10 MG PO TABS: 10.0000 mg | ORAL_TABLET | ORAL | 0 refills | Status: DC | PRN

## 2020-01-18 NOTE — Progress Notes (Addendum)
Established Patient Office Visit  Subjective:  Patient ID: Patricia Underwood, female    DOB: Oct 20, 1951  Age: 69 y.o. MRN: [id number]  CC:  Chief Complaint  Patient presents with  . Medication Management    HPI Geral Tuch presents for medication management. She has a history of Fibromyalgia and Osteoarthritis, and Chronic joint pain. She takes Oxycodone 10 mg every 4 hours for pain. She is requesting to switch to percocet because she feels the Oxycodone is not helping much. She reports her wrist and ankles being being painful.   Past Medical History:  Diagnosis Date  . B12 deficiency, Rx B12 injections 07/05/2017  . Chronic back pain   . Chronic right shoulder pain 05/30/2017  . Fibromyalgia   . HLD (hyperlipidemia) 05/30/2017    Past Surgical History:  Procedure Laterality Date  . ABDOMINAL HYSTERECTOMY    . KNEE SURGERY Right   . LUMBAR FUSION     L4-L5  . SHOULDER ARTHROSCOPY Right     Family History  Family history unknown: Yes    Social History   Socioeconomic History  . Marital status: Married    Spouse name: Not on file  . Number of children: Not on file  . Years of education: Not on file  . Highest education level: Not on file  Occupational History  . Occupation: Retired   Tobacco Use  . Smoking status: Former Research scientist (life sciences)  . Smokeless tobacco: Never Used  Substance and Sexual Activity  . Alcohol use: Yes    Alcohol/week: 7.0 standard drinks    Types: 7 Glasses of wine per week  . Drug use: No  . Sexual activity: Not Currently    Partners: Male    Birth control/protection: None  Other Topics Concern  . Not on file  Social History Narrative  . Not on file   Social Determinants of Health   Financial Resource Strain:   . Difficulty of Paying Living Expenses: Not on file  Food Insecurity:   . Worried About Charity fundraiser in the Last Year: Not on file  . Ran Out of Food in the Last Year: Not on file  Transportation Needs:   . Lack of Transportation  (Medical): Not on file  . Lack of Transportation (Non-Medical): Not on file  Physical Activity:   . Days of Exercise per Week: Not on file  . Minutes of Exercise per Session: Not on file  Stress:   . Feeling of Stress : Not on file  Social Connections:   . Frequency of Communication with Friends and Family: Not on file  . Frequency of Social Gatherings with Friends and Family: Not on file  . Attends Religious Services: Not on file  . Active Member of Clubs or Organizations: Not on file  . Attends Archivist Meetings: Not on file  . Marital Status: Not on file  Intimate Partner Violence:   . Fear of Current or Ex-Partner: Not on file  . Emotionally Abused: Not on file  . Physically Abused: Not on file  . Sexually Abused: Not on file    Outpatient Medications Prior to Visit  Medication Sig Dispense Refill  . albuterol (PROVENTIL HFA;VENTOLIN HFA) 108 (90 Base) MCG/ACT inhaler INHALE 2 PUFFS INTO THE LUNGS EVERY 6 HOURS AS NEEDED FOR WHEEZING OR FOR SHORTNESS OF BREATH 1 Inhaler 3  . atorvastatin (LIPITOR) 20 MG tablet Take 20 mg by mouth at bedtime.     . cyanocobalamin (,VITAMIN B-12,) 1000 MCG/ML injection 1000  mcg (1 mg) injection once every other week 6 mL 3  . LORazepam (ATIVAN) 2 MG tablet Take 2 mg by mouth at bedtime as needed for anxiety.    [name] OVER THE COUNTER MEDICATION Formula 7 solution - for toenails    . pantoprazole (PROTONIX) 40 MG tablet Take 1 tablet (40 mg total) by mouth 2 (two) times daily. 60 tablet 2  . pilocarpine (SALAGEN) 5 MG tablet Take 5 mg by mouth. 3 times a week as needed    . Syringe/Needle, Disp, (SYRINGE 3CC/25GX1") 25G X 1" 3 ML MISC 1 application by Does not apply route once a week. 12 each 3  . traZODone (DESYREL) 50 MG tablet Take 0.5-1 tablets (25-50 mg total) by mouth at bedtime as needed for sleep. 30 tablet 1  . tretinoin (RETIN-A) 0.1 % cream Apply topically at bedtime. (Patient taking differently: Apply topically 3 (three) times  daily. ) 45 g 2  . Oxycodone HCl 10 MG TABS Take 1 tablet (10 mg total) by mouth every 4 (four) hours as needed. 180 tablet 0  . Oxycodone HCl 10 MG TABS Take 1 tablet (10 mg total) by mouth every 4 (four) hours as needed. 180 tablet 0  . Oxycodone HCl 10 MG TABS Take 1 tablet (10 mg total) by mouth every 4 (four) hours as needed. 180 tablet 0   No facility-administered medications prior to visit.    Allergies  Allergen Reactions  . Ambien [Zolpidem Tartrate]   . Flexeril [Cyclobenzaprine] Other (See Comments)    Patient unsure of reaction  . Naproxen Other (See Comments)    Patient unsure of reaction.    ROS Review of Systems  All other systems reviewed and are negative.     Objective:    Physical Exam  Constitutional: She is oriented to person, place, and time. She appears well-developed and well-nourished.  Cardiovascular: Normal rate and regular rhythm.  Pulmonary/Chest: Effort normal and breath sounds normal.  Musculoskeletal:        General: Tenderness and deformity present.     Cervical back: Normal range of motion.     Comments: Joint tenderness, no swelling or redness (ankles, feet, wrists). Bouchard nodes to the hands bilaterally  Neurological: She is oriented to person, place, and time.  Skin: Skin is warm and dry.  Psychiatric: She has a normal mood and affect.    BP (!) 150/90 (BP Location: Left Arm, Patient Position: Sitting, Cuff Size: Normal)   Pulse (!) 110   Temp 97.8 F (36.6 C) (Temporal)   Wt 101 lb 11.2 oz (46.1 kg)   SpO2 97%   BMI 17.46 kg/m  Wt Readings from Last 3 Encounters:  01/17/20 101 lb 11.2 oz (46.1 kg)  11/03/19 108 lb (49 kg)  10/21/19 106 lb 6.4 oz (48.3 kg)     There are no preventive care reminders to display for this patient.  There are no preventive care reminders to display for this patient.  Lab Results  Component Value Date   TSH 1.10 09/14/2019   Lab Results  Component Value Date   WBC 10.9 (H) 09/14/2019    HGB 13.6 09/14/2019   HCT 40.2 09/14/2019   MCV 94.2 09/14/2019   PLT 277.0 09/14/2019   Lab Results  Component Value Date   NA 137 09/14/2019   K 4.3 09/14/2019   CO2 29 09/14/2019   GLUCOSE 104 (H) 09/14/2019   BUN 14 09/14/2019   CREATININE 0.66 09/14/2019   BILITOT 0.4 09/14/2019  ALKPHOS 84 09/14/2019   AST 21 09/14/2019   ALT 14 09/14/2019   PROT 6.8 09/14/2019   ALBUMIN 4.4 09/14/2019   CALCIUM 9.4 09/14/2019   GFR 88.88 09/14/2019   Lab Results  Component Value Date   CHOL 202 (H) 09/03/2018   Lab Results  Component Value Date   HDL 86.20 09/03/2018   Lab Results  Component Value Date   LDLCALC 96 09/03/2018   Lab Results  Component Value Date   TRIG 98.0 09/03/2018   Lab Results  Component Value Date   CHOLHDL 2 09/03/2018   Lab Results  Component Value Date   HGBA1C 5.4 07/09/2017      Assessment & Plan:   Problem List Items Addressed This Visit    Fibromyalgia - Primary   Relevant Medications   predniSONE (DELTASONE) 20 MG tablet   Chronic pain syndrome   Relevant Medications   predniSONE (DELTASONE) 20 MG tablet      Meds ordered this encounter  Medications  . predniSONE (DELTASONE) 20 MG tablet    Sig: Take 1 tablet (20 mg total) by mouth 2 (two) times daily with a meal.    Dispense:  10 tablet    Refill:  0  . DISCONTD: Oxycodone HCl 10 MG TABS    Sig: Take 1 tablet (10 mg total) by mouth every 4 (four) hours as needed.    Dispense:  180 tablet    Refill:  0    FILL IN ONE MONTH   Deniah was seen today for medication management.  Diagnoses and all orders for this visit:  Fibromyalgia  Chronic pain syndrome -     Discontinue: Oxycodone HCl 10 MG TABS; Take 1 tablet (10 mg total) by mouth every 4 (four) hours as needed.  Other orders -     predniSONE (DELTASONE) 20 MG tablet; Take 1 tablet (20 mg total) by mouth 2 (two) times daily with a meal.  See Dr. Ardyth Harps regarding her request to switch to percocet. I will refill  meds as prescribed. The prednisone will help reduce inflammation in the interim.  Follow-up: Return in about 2 weeks (around 01/31/2020).    Eulis Foster, FNP

## 2020-01-18 NOTE — Telephone Encounter (Signed)
Left detailed message on machine for patient to let her know that a new prescription has been sent.

## 2020-01-19 ENCOUNTER — Ambulatory Visit: Payer: Medicare Other | Admitting: Family

## 2020-02-09 DIAGNOSIS — M797 Fibromyalgia: Secondary | ICD-10-CM | POA: Diagnosis not present

## 2020-02-09 DIAGNOSIS — R110 Nausea: Secondary | ICD-10-CM | POA: Diagnosis not present

## 2020-02-16 DIAGNOSIS — F4320 Adjustment disorder, unspecified: Secondary | ICD-10-CM | POA: Diagnosis not present

## 2020-02-17 ENCOUNTER — Telehealth: Payer: Medicare Other | Admitting: Internal Medicine

## 2020-02-24 ENCOUNTER — Encounter: Payer: Self-pay | Admitting: Internal Medicine

## 2020-02-24 ENCOUNTER — Other Ambulatory Visit: Payer: Self-pay

## 2020-02-24 ENCOUNTER — Telehealth: Payer: Medicare Other | Admitting: Internal Medicine

## 2020-02-24 ENCOUNTER — Ambulatory Visit (INDEPENDENT_AMBULATORY_CARE_PROVIDER_SITE_OTHER): Payer: Medicare Other | Admitting: Internal Medicine

## 2020-02-24 VITALS — BP 110/70 | HR 84 | Temp 97.3°F | Wt 100.4 lb

## 2020-02-24 DIAGNOSIS — F5102 Adjustment insomnia: Secondary | ICD-10-CM | POA: Diagnosis not present

## 2020-02-24 DIAGNOSIS — E538 Deficiency of other specified B group vitamins: Secondary | ICD-10-CM | POA: Diagnosis not present

## 2020-02-24 DIAGNOSIS — E782 Mixed hyperlipidemia: Secondary | ICD-10-CM

## 2020-02-24 DIAGNOSIS — R059 Cough, unspecified: Secondary | ICD-10-CM

## 2020-02-24 DIAGNOSIS — L709 Acne, unspecified: Secondary | ICD-10-CM

## 2020-02-24 DIAGNOSIS — R4589 Other symptoms and signs involving emotional state: Secondary | ICD-10-CM | POA: Diagnosis not present

## 2020-02-24 DIAGNOSIS — R05 Cough: Secondary | ICD-10-CM | POA: Diagnosis not present

## 2020-02-24 MED ORDER — CYANOCOBALAMIN 1000 MCG/ML IJ SOLN: INTRAMUSCULAR | 3 refills | Status: DC

## 2020-02-24 MED ORDER — TRETINOIN 0.1 % EX CREA: TOPICAL_CREAM | Freq: Every day | CUTANEOUS | 2 refills | Status: DC

## 2020-02-24 MED ORDER — ALBUTEROL SULFATE HFA 108 (90 BASE) MCG/ACT IN AERS: INHALATION_SPRAY | RESPIRATORY_TRACT | 2 refills | Status: DC

## 2020-02-24 MED ORDER — DIAZEPAM 2 MG PO TABS: 2.0000 mg | ORAL_TABLET | Freq: Every evening | ORAL | 0 refills | Status: DC | PRN

## 2020-02-24 NOTE — Patient Instructions (Signed)
-  Nice seeing you today!!  -Take 1 valium at bedtime for sleep.  -Schedule follow up for your physical. Please come in fasting that day.

## 2020-02-24 NOTE — Progress Notes (Signed)
Established Patient Office Visit     This visit occurred during the SARS-CoV-2 public health emergency.  Safety protocols were in place, including screening questions prior to the visit, additional usage of staff PPE, and extensive cleaning of exam room while observing appropriate contact time as indicated for disinfecting solutions.    CC/Reason for Visit: Follow-up chronic conditions, discuss acute concerns  HPI: Patricia Underwood is a 69 y.o. female who is coming in today for the above mentioned reasons. Past Medical History is significant for: Hyperlipidemia, vitamin D deficiency, insomnia and chronic pain syndrome.  She has signed a pain contract with me for oxycodone 10 mg every 4 hours.  Since I last saw her she has been going through a divorce.  She decided to quit taking oxycodone as it "made her mind clouded".  She decided to go to [facility] again and now is back with them on a contract with Suboxone that she is taking 3 times a day.  She is considering moving to [place].  She feels like her mood has been depressed, she has lost weight.  She has already made an appointment soon to initiate CBT sessions.  She feels like the Ativan is not helping for sleep and is wondering if this can be transitioned over to Valium.   Past Medical/Surgical History: Past Medical History:  Diagnosis Date  . B12 deficiency, Rx B12 injections 07/05/2017  . Chronic back pain   . Chronic right shoulder pain 05/30/2017  . Fibromyalgia   . HLD (hyperlipidemia) 05/30/2017    Past Surgical History:  Procedure Laterality Date  . ABDOMINAL HYSTERECTOMY    . KNEE SURGERY Right   . LUMBAR FUSION     L4-L5  . SHOULDER ARTHROSCOPY Right     Social History:  reports that she has quit smoking. She has never used smokeless tobacco. She reports current alcohol use of about 7.0 standard drinks of alcohol per week. She reports that she does not use drugs.  Allergies: Allergies  Allergen Reactions    . Ambien [Zolpidem Tartrate]   . Flexeril [Cyclobenzaprine] Other (See Comments)    Patient unsure of reaction  . Naproxen Other (See Comments)    Patient unsure of reaction.    Family History:  Family History  Family history unknown: Yes     Current Outpatient Medications:  .  albuterol (VENTOLIN HFA) 108 (90 Base) MCG/ACT inhaler, INHALE 2 PUFFS INTO THE LUNGS EVERY 6 HOURS AS NEEDED FOR WHEEZING OR FOR SHORTNESS OF BREATH, Disp: 18 g, Rfl: 2 .  cyanocobalamin (,VITAMIN B-12,) 1000 MCG/ML injection, 1000 mcg (1 mg) injection once every other week, Disp: 6 mL, Rfl: 3 .  OVER THE COUNTER MEDICATION, Formula 7 solution - for toenails, Disp: , Rfl:  .  pilocarpine (SALAGEN) 5 MG tablet, Take 5 mg by mouth. 3 times a week as needed, Disp: , Rfl:  .  Syringe/Needle, Disp, (SYRINGE 3CC/25GX1") 25G X 1" 3 ML MISC, 1 application by Does not apply route once a week., Disp: 12 each, Rfl: 3 .  tretinoin (RETIN-A) 0.1 % cream, Apply topically at bedtime., Disp: 45 g, Rfl: 2 .  atorvastatin (LIPITOR) 20 MG tablet, Take 20 mg by mouth at bedtime. , Disp: , Rfl:  .  diazepam (VALIUM) 2 MG tablet, Take 1 tablet (2 mg total) by mouth at bedtime as needed for anxiety., Disp: 30 tablet, Rfl: 0 .  pantoprazole (PROTONIX) 40 MG tablet, Take 1 tablet (40 mg total) by  mouth 2 (two) times daily. (Patient not taking: Reported on 02/24/2020), Disp: 60 tablet, Rfl: 2  Review of Systems:  Constitutional: Denies fever, chills, diaphoresis,  and fatigue.  HEENT: Denies photophobia, eye pain, redness, hearing loss, ear pain, congestion, sore throat, rhinorrhea, sneezing, mouth sores, trouble swallowing, neck pain, neck stiffness and tinnitus.   Respiratory: Denies SOB, DOE, cough, chest tightness,  and wheezing.   Cardiovascular: Denies chest pain, palpitations and leg swelling.  Gastrointestinal: Denies nausea, vomiting, abdominal pain, diarrhea, constipation, blood in stool and abdominal distention.   Genitourinary: Denies dysuria, urgency, frequency, hematuria, flank pain and difficulty urinating.  Endocrine: Denies: hot or cold intolerance, sweats, changes in hair or nails, polyuria, polydipsia. Musculoskeletal: Denies myalgias, back pain, joint swelling, arthralgias and gait problem.  Skin: Denies pallor, rash and wound.  Neurological: Denies dizziness, seizures, syncope, weakness, light-headedness, numbness and headaches.  Hematological: Denies adenopathy. Easy bruising, personal or family bleeding history  Psychiatric/Behavioral: Denies suicidal ideation, confusion, nervousness,  and agitation    Physical Exam: Vitals:   02/24/20 1310  BP: 110/70  Pulse: 84  Temp: (!) 97.3 F (36.3 C)  TempSrc: Temporal  SpO2: 96%  Weight: 100 lb 6.4 oz (45.5 kg)    Body mass index is 17.23 kg/m.   Constitutional: NAD, calm, comfortable Eyes: PERRL, lids and conjunctivae normal ENMT: Mucous membranes are moist. Respiratory: clear to auscultation bilaterally, no wheezing, no crackles. Normal respiratory effort. No accessory muscle use.  Cardiovascular: Regular rate and rhythm, no murmurs / rubs / gallops. No extremity edema.  Neurologic: Grossly intact and nonfocal Psychiatric: Normal judgment and insight. Alert and oriented x 3. Normal mood.    Impression and Plan:  Mixed hyperlipidemia -Last LDL was 96 in 2019-Lipitor refilled  Adult acne  - Plan: tretinoin (RETIN-A) 0.1 % cream.  She has been taking this long-term.  Cough  - Plan: albuterol (VENTOLIN HFA) 108 (90 Base) MCG/ACT inhaler she takes this as needed.  B12 deficiency - Plan: cyanocobalamin (,VITAMIN B-12,) 1000 MCG/ML injection  Adjustment insomnia  -Per her request we will discontinue lorazepam and start Valium 2 mg at bedtime.  Depressed mood   Office Visit from 02/24/2020 in [facility] [place]  PHQ-9 Total Score  13     -She has made appointment to start CBT sessions. -She is hesitant to  start medications, she would like to do therapy first and then make a decision on antidepressants. -She has no suicidal ideation.    Patient Instructions  -Nice seeing you today!!  -Take 1 valium at bedtime for sleep.  -Schedule follow up for your physical. Please come in fasting that day.     Chaya Jan, MD [facility] at [facility]

## 2020-02-29 ENCOUNTER — Other Ambulatory Visit: Payer: Self-pay | Admitting: *Deleted

## 2020-02-29 DIAGNOSIS — E538 Deficiency of other specified B group vitamins: Secondary | ICD-10-CM

## 2020-02-29 MED ORDER — "SYRINGE 25G X 1"" 3 ML MISC": 1.0000 "application " | 3 refills | Status: DC

## 2020-03-06 DIAGNOSIS — M2550 Pain in unspecified joint: Secondary | ICD-10-CM | POA: Diagnosis not present

## 2020-03-06 DIAGNOSIS — Z681 Body mass index (BMI) 19 or less, adult: Secondary | ICD-10-CM | POA: Diagnosis not present

## 2020-03-06 DIAGNOSIS — R682 Dry mouth, unspecified: Secondary | ICD-10-CM | POA: Diagnosis not present

## 2020-03-06 DIAGNOSIS — M150 Primary generalized (osteo)arthritis: Secondary | ICD-10-CM | POA: Diagnosis not present

## 2020-03-06 DIAGNOSIS — M5136 Other intervertebral disc degeneration, lumbar region: Secondary | ICD-10-CM | POA: Diagnosis not present

## 2020-03-06 DIAGNOSIS — M797 Fibromyalgia: Secondary | ICD-10-CM | POA: Diagnosis not present

## 2020-03-07 DIAGNOSIS — R110 Nausea: Secondary | ICD-10-CM | POA: Diagnosis not present

## 2020-03-07 DIAGNOSIS — M797 Fibromyalgia: Secondary | ICD-10-CM | POA: Diagnosis not present

## 2020-03-07 DIAGNOSIS — F4322 Adjustment disorder with anxiety: Secondary | ICD-10-CM | POA: Diagnosis not present

## 2020-03-13 ENCOUNTER — Ambulatory Visit (INDEPENDENT_AMBULATORY_CARE_PROVIDER_SITE_OTHER): Payer: Medicare Other | Admitting: Internal Medicine

## 2020-03-13 ENCOUNTER — Encounter: Payer: Self-pay | Admitting: Internal Medicine

## 2020-03-13 ENCOUNTER — Other Ambulatory Visit: Payer: Self-pay

## 2020-03-13 VITALS — BP 110/62 | HR 74 | Temp 97.4°F | Ht 63.0 in | Wt 99.3 lb

## 2020-03-13 DIAGNOSIS — Z0000 Encounter for general adult medical examination without abnormal findings: Secondary | ICD-10-CM

## 2020-03-13 DIAGNOSIS — R7989 Other specified abnormal findings of blood chemistry: Secondary | ICD-10-CM

## 2020-03-13 DIAGNOSIS — E538 Deficiency of other specified B group vitamins: Secondary | ICD-10-CM | POA: Diagnosis not present

## 2020-03-13 DIAGNOSIS — E782 Mixed hyperlipidemia: Secondary | ICD-10-CM | POA: Diagnosis not present

## 2020-03-13 DIAGNOSIS — F5102 Adjustment insomnia: Secondary | ICD-10-CM

## 2020-03-13 DIAGNOSIS — H903 Sensorineural hearing loss, bilateral: Secondary | ICD-10-CM

## 2020-03-13 DIAGNOSIS — G894 Chronic pain syndrome: Secondary | ICD-10-CM

## 2020-03-13 DIAGNOSIS — E559 Vitamin D deficiency, unspecified: Secondary | ICD-10-CM

## 2020-03-13 DIAGNOSIS — F4322 Adjustment disorder with anxiety: Secondary | ICD-10-CM | POA: Diagnosis not present

## 2020-03-13 DIAGNOSIS — R7302 Impaired glucose tolerance (oral): Secondary | ICD-10-CM | POA: Diagnosis not present

## 2020-03-13 MED ORDER — DIAZEPAM 5 MG PO TABS: 5.0000 mg | ORAL_TABLET | Freq: Every evening | ORAL | 1 refills | Status: DC | PRN

## 2020-03-13 NOTE — Progress Notes (Signed)
Established Patient Office Visit     This visit occurred during the SARS-CoV-2 public health emergency.  Safety protocols were in place, including screening questions prior to the visit, additional usage of staff PPE, and extensive cleaning of exam room while observing appropriate contact time as indicated for disinfecting solutions.    CC/Reason for Visit: Subsequent Medicare wellness visit and follow-up chronic medical conditions  HPI: Patricia Underwood is a 69 y.o. female who is coming in today for the above mentioned reasons. Past Medical History is significant for: Hyperlipidemia, vitamin D deficiency, insomnia and chronic pain syndrome.  She tells me that the Valium that we have prescribed at last visit is not working and that she will sometimes have to take 2 which seems to do better for her in regards to insomnia.  She is not fasting today, she refuses colonoscopy, she is due for shingles and pneumonia vaccinations which she refuses.  She has had a total hysterectomy and was told by her GYN that she no longer needs Pap smears.  Her last mammogram was in December 2019, she is overdue.   Past Medical/Surgical History: Past Medical History:  Diagnosis Date  . B12 deficiency, Rx B12 injections 07/05/2017  . Chronic back pain   . Chronic right shoulder pain 05/30/2017  . Fibromyalgia   . HLD (hyperlipidemia) 05/30/2017    Past Surgical History:  Procedure Laterality Date  . ABDOMINAL HYSTERECTOMY    . KNEE SURGERY Right   . LUMBAR FUSION     L4-L5  . SHOULDER ARTHROSCOPY Right     Social History:  reports that she has quit smoking. She has never used smokeless tobacco. She reports current alcohol use of about 7.0 standard drinks of alcohol per week. She reports that she does not use drugs.  Allergies: Allergies  Allergen Reactions  . Ambien [Zolpidem Tartrate]   . Flexeril [Cyclobenzaprine] Other (See Comments)    Patient unsure of reaction  . Naproxen Other (See Comments)      Patient unsure of reaction.    Family History:  Family History  Family history unknown: Yes     Current Outpatient Medications:  .  albuterol (VENTOLIN HFA) 108 (90 Base) MCG/ACT inhaler, INHALE 2 PUFFS INTO THE LUNGS EVERY 6 HOURS AS NEEDED FOR WHEEZING OR FOR SHORTNESS OF BREATH, Disp: 18 g, Rfl: 2 .  atorvastatin (LIPITOR) 20 MG tablet, Take 20 mg by mouth at bedtime. , Disp: , Rfl:  .  cyanocobalamin (,VITAMIN B-12,) 1000 MCG/ML injection, 1000 mcg (1 mg) injection once every other week, Disp: 6 mL, Rfl: 3 .  diazepam (VALIUM) 5 MG tablet, Take 1 tablet (5 mg total) by mouth at bedtime as needed for anxiety., Disp: 30 tablet, Rfl: 1 .  OVER THE COUNTER MEDICATION, Formula 7 solution - for toenails, Disp: , Rfl:  .  pantoprazole (PROTONIX) 40 MG tablet, Take 1 tablet (40 mg total) by mouth 2 (two) times daily., Disp: 60 tablet, Rfl: 2 .  pilocarpine (SALAGEN) 5 MG tablet, Take 5 mg by mouth. 3 times a week as needed, Disp: , Rfl:  .  Syringe/Needle, Disp, (SYRINGE 3CC/25GX1") 25G X 1" 3 ML MISC, 1 application by Does not apply route once a week., Disp: 12 each, Rfl: 3 .  tretinoin (RETIN-A) 0.1 % cream, Apply topically at bedtime., Disp: 45 g, Rfl: 2  Review of Systems:  Constitutional: Denies fever, chills, diaphoresis, appetite change and fatigue.  HEENT: Denies photophobia, eye pain, redness, hearing loss, ear  pain, congestion, sore throat, rhinorrhea, sneezing, mouth sores, trouble swallowing, neck pain, neck stiffness and tinnitus.   Respiratory: Denies SOB, DOE, cough, chest tightness,  and wheezing.   Cardiovascular: Denies chest pain, palpitations and leg swelling.  Gastrointestinal: Denies nausea, vomiting, abdominal pain, diarrhea, constipation, blood in stool and abdominal distention.  Genitourinary: Denies dysuria, urgency, frequency, hematuria, flank pain and difficulty urinating.  Endocrine: Denies: hot or cold intolerance, sweats, changes in hair or nails, polyuria,  polydipsia. Musculoskeletal: Denies myalgias, back pain, joint swelling, arthralgias and gait problem.  Skin: Denies pallor, rash and wound.  Neurological: Denies dizziness, seizures, syncope, weakness, light-headedness, numbness and headaches.  Hematological: Denies adenopathy. Easy bruising, personal or family bleeding history  Psychiatric/Behavioral: Denies suicidal ideation, mood changes, confusion, nervousness, sleep disturbance and agitation    Physical Exam: Vitals:   03/13/20 1011  BP: 110/62  Pulse: 74  Temp: (!) 97.4 F (36.3 C)  TempSrc: Temporal  SpO2: 95%  Weight: 99 lb 4.8 oz (45 kg)  Height: 5\' 3"  (1.6 m)    Body mass index is 17.59 kg/m.   Constitutional: NAD, calm, comfortable Eyes: PERRL, lids and conjunctivae normal, wears corrective lenses ENMT: Mucous membranes are moist.  Tympanic membrane is pearly white, no erythema or bulging.  Wears hearing aids. Neck: normal, supple, no masses, no thyromegaly Respiratory: clear to auscultation bilaterally, no wheezing, no crackles. Normal respiratory effort. No accessory muscle use.  Cardiovascular: Regular rate and rhythm, no murmurs / rubs / gallops. No extremity edema. 2+ pedal pulses.  Abdomen: no tenderness, no masses palpated. No hepatosplenomegaly. Bowel sounds positive.  Musculoskeletal: no clubbing / cyanosis. No joint deformity upper and lower extremities. Good ROM, no contractures. Normal muscle tone.  Skin: no rashes, lesions, ulcers. No induration Neurologic: CN 2-12 grossly intact. Sensation intact, DTR normal. Strength 5/5 in all 4.  Psychiatric: Normal judgment and insight. Alert and oriented x 3. Normal mood.   Subsequent Medicare wellness visit   1. Risk factors, based on past  M,S,F -cardiovascular disease risk factors include age, history of hyperlipidemia.   2.  Physical activities: She is not very physically active   3.  Depression/mood:  Depressed   4.  Hearing:  She wears hearing aids     5.  ADL's: Independent in all ADLs   6.  Fall risk:  Low fall risk   7.  Home safety: No problems identified   8.  Height weight, and visual acuity: Height and weight as above, visual acuity is 20/25 with each eye independently and together   9.  Counseling:  I have continue to encourage CBT sessions   10. Lab orders based on risk factors: Laboratory update will be reviewed   11. Referral :  None today   12. Care plan:  Follow-up in 6 months   13. Cognitive assessment:  No cognitive impairment   14. Screening: Patient provided with a written and personalized 5-10 year screening schedule in the AVS.   yes   15. Provider List Update:   PCP, pain management specialist  16. Advance Directives: Full code     Office Visit from 02/24/2020 in [facility] at [facility] Total Score  13      Fall Risk  04/19/2019 03/22/2019 01/19/2019 09/11/2018 08/28/2017  Falls in the past year? 1 0 0 0 No  Number falls in past yr: 1 0 0 0 -  Injury with Fall? 0 0 0 0 -  Follow up - - - Falls prevention  discussed -     Impression and Plan:  Encounter for preventive health examination -She has routine eye and dental care. -She is due for shingles, Pneumovax and Prevnar but she refuses all vaccines today. -She is overdue for screening colonoscopy but she refuses this today.  We have discussed Cologuard but she has decided she will think about it. -Her last screening mammogram was in December 2019, she is overdue, she would not like me to place referral today. -Screening labs she will return for she is not fasting today. -Healthy lifestyle discussed in detail.  Chronic pain syndrome -Managed by chronic pain specialist, she is now on Suboxone.  Sensorineural hearing loss (SNHL) of both ears, followed by [facility], Arlie Solomons -Wears bilateral hearing aids.  B12 deficiency, Rx B12 injections  - Plan: Vitamin B12  Mixed hyperlipidemia  -Check lipids. -Last LDL was 96 in  2019. -Continue atorvastatin 20 mg for now.  Adjustment insomnia  - Plan: diazepam (VALIUM) 5 MG tablet  Vitamin D deficiency  - Plan: VITAMIN D 25 Hydroxy (Vit-D Deficiency, Fractures)       Isaac Bliss, MD [facility] at [facility]

## 2020-03-16 DIAGNOSIS — F4322 Adjustment disorder with anxiety: Secondary | ICD-10-CM | POA: Diagnosis not present

## 2020-03-18 IMAGING — DX DG LUMBAR SPINE COMPLETE 4+V
5 series · 5 of 5 positions shown · non-contrast
Comparison: Lumbar MRI 06/21/2018.

CLINICAL DATA: 68-year-old female with pain radiating to the
buttock and left leg weakness.

EXAM:
LUMBAR SPINE - COMPLETE 4+ VIEW

[lumbar spine ap]
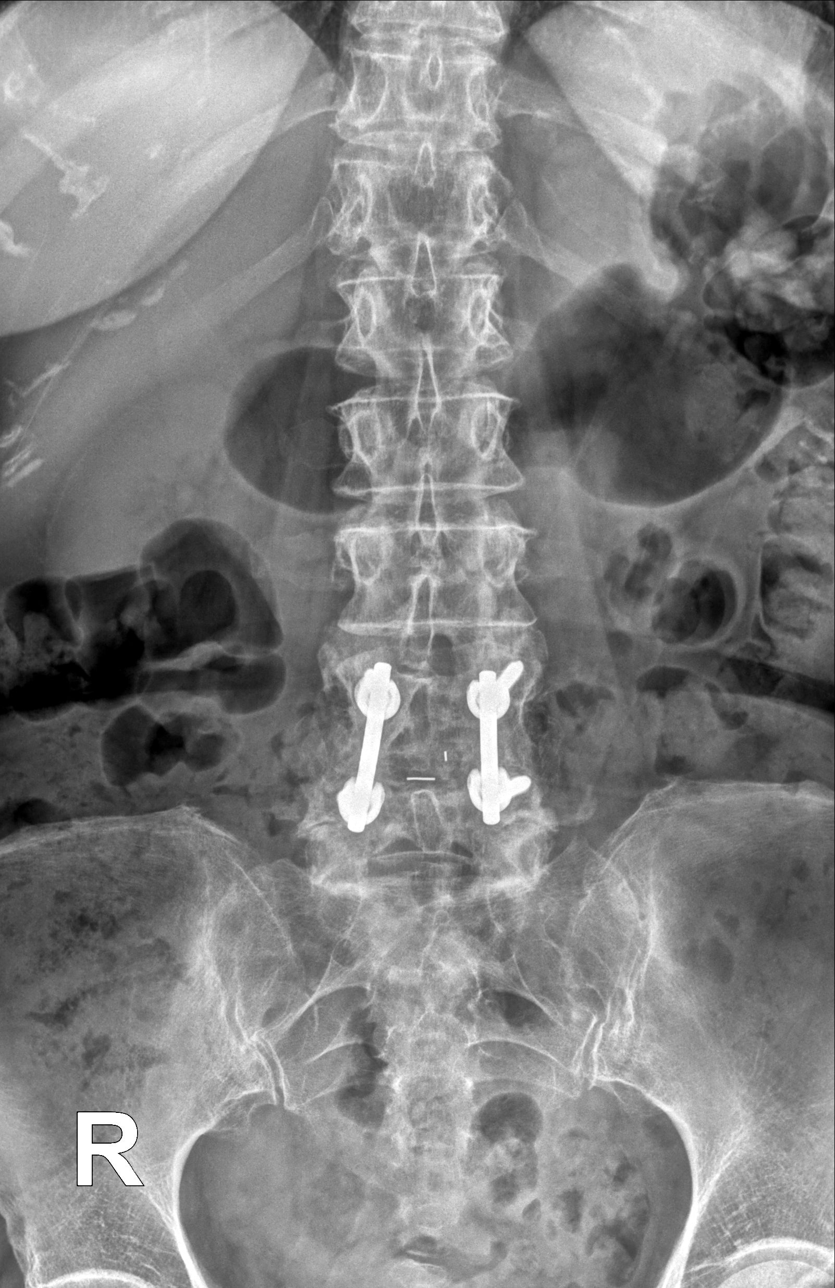

[lumbar spine oblique (1 of 2)]
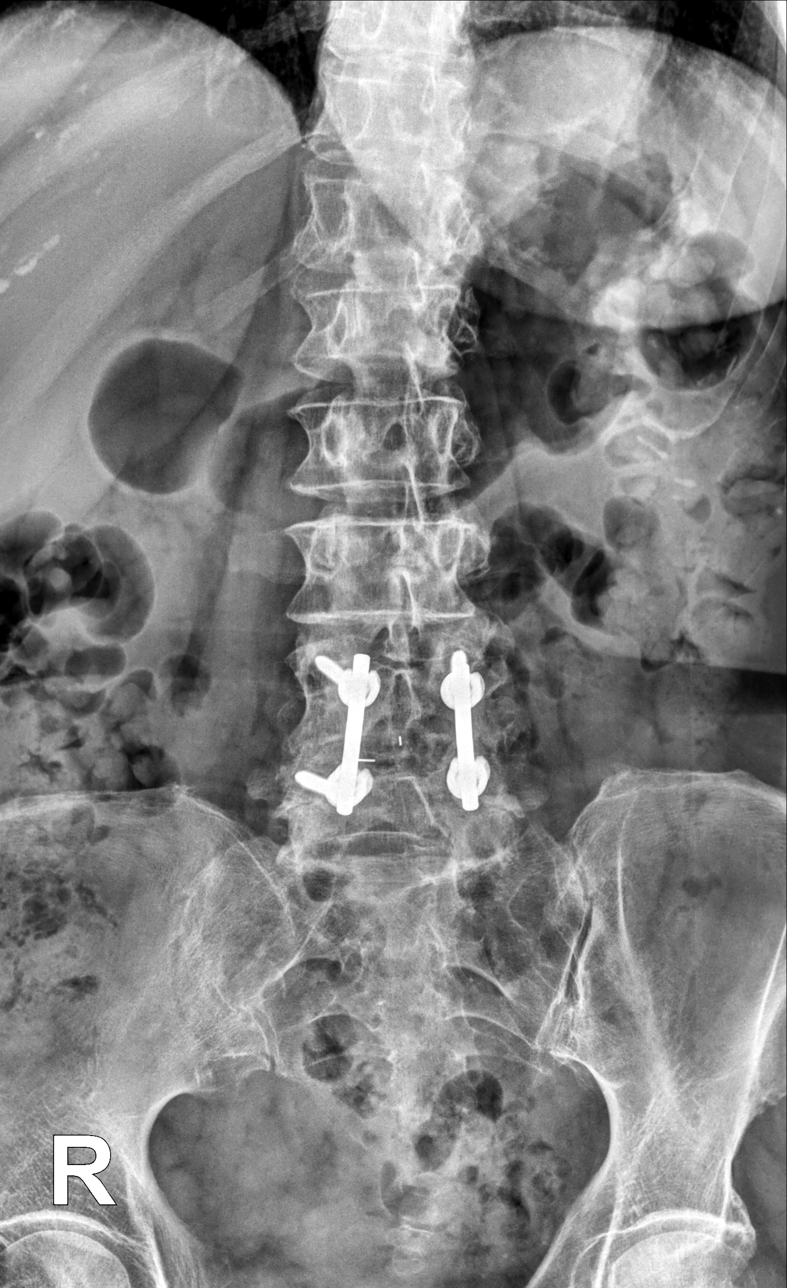

[lumbar spine oblique (2 of 2)]
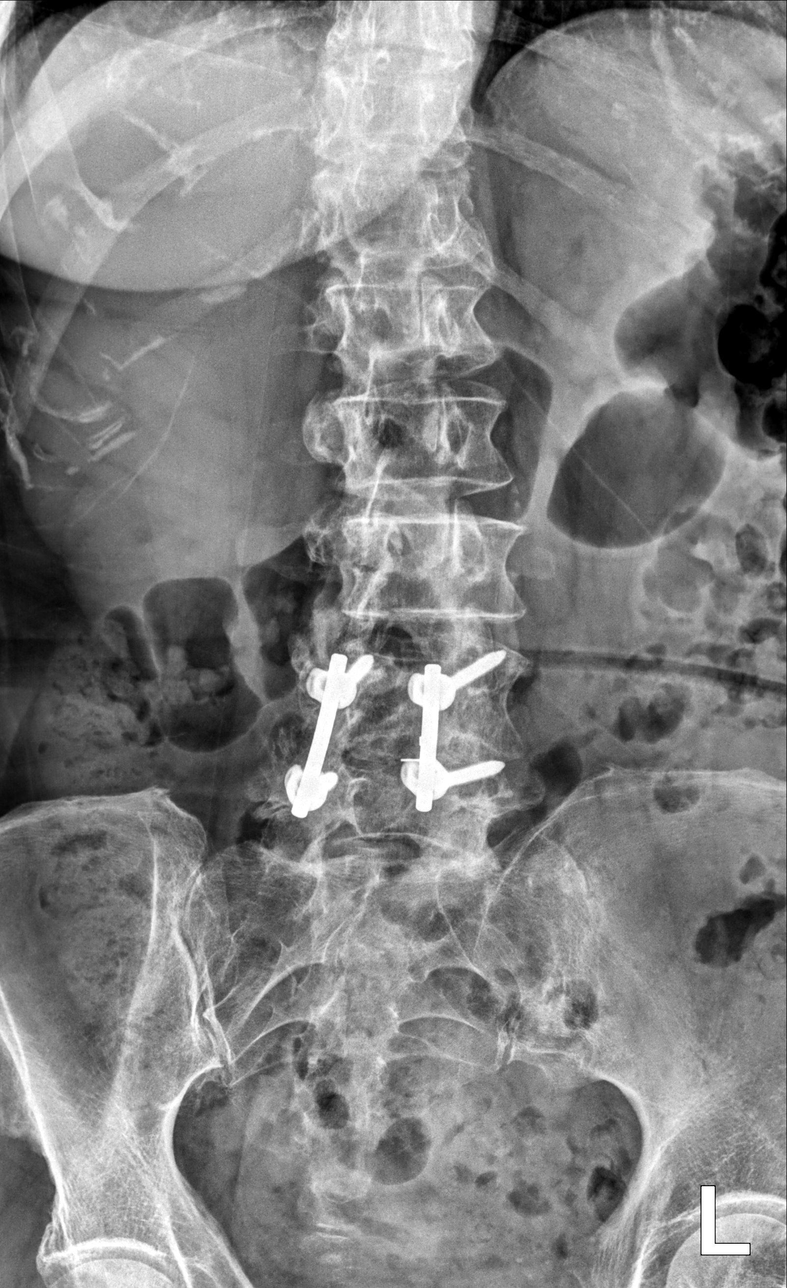

[lumbar spine lat (1 of 2)]
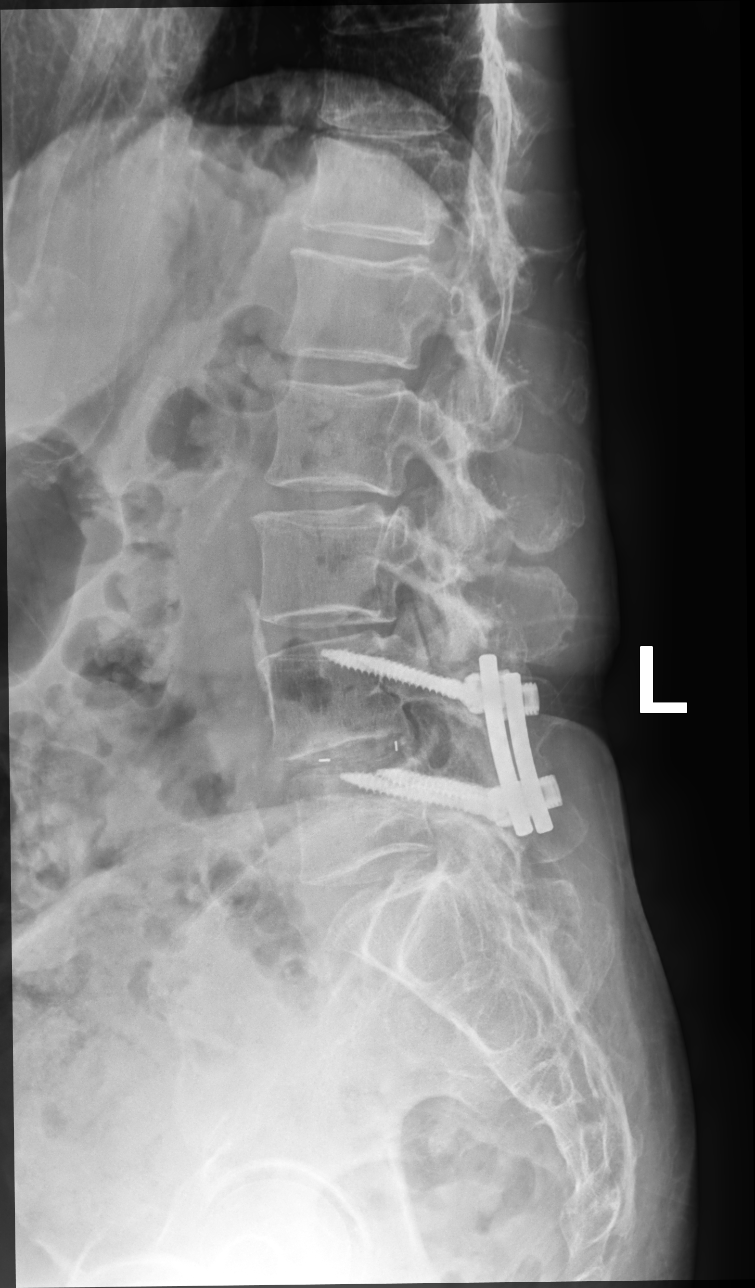

[lumbar spine lat (2 of 2)]
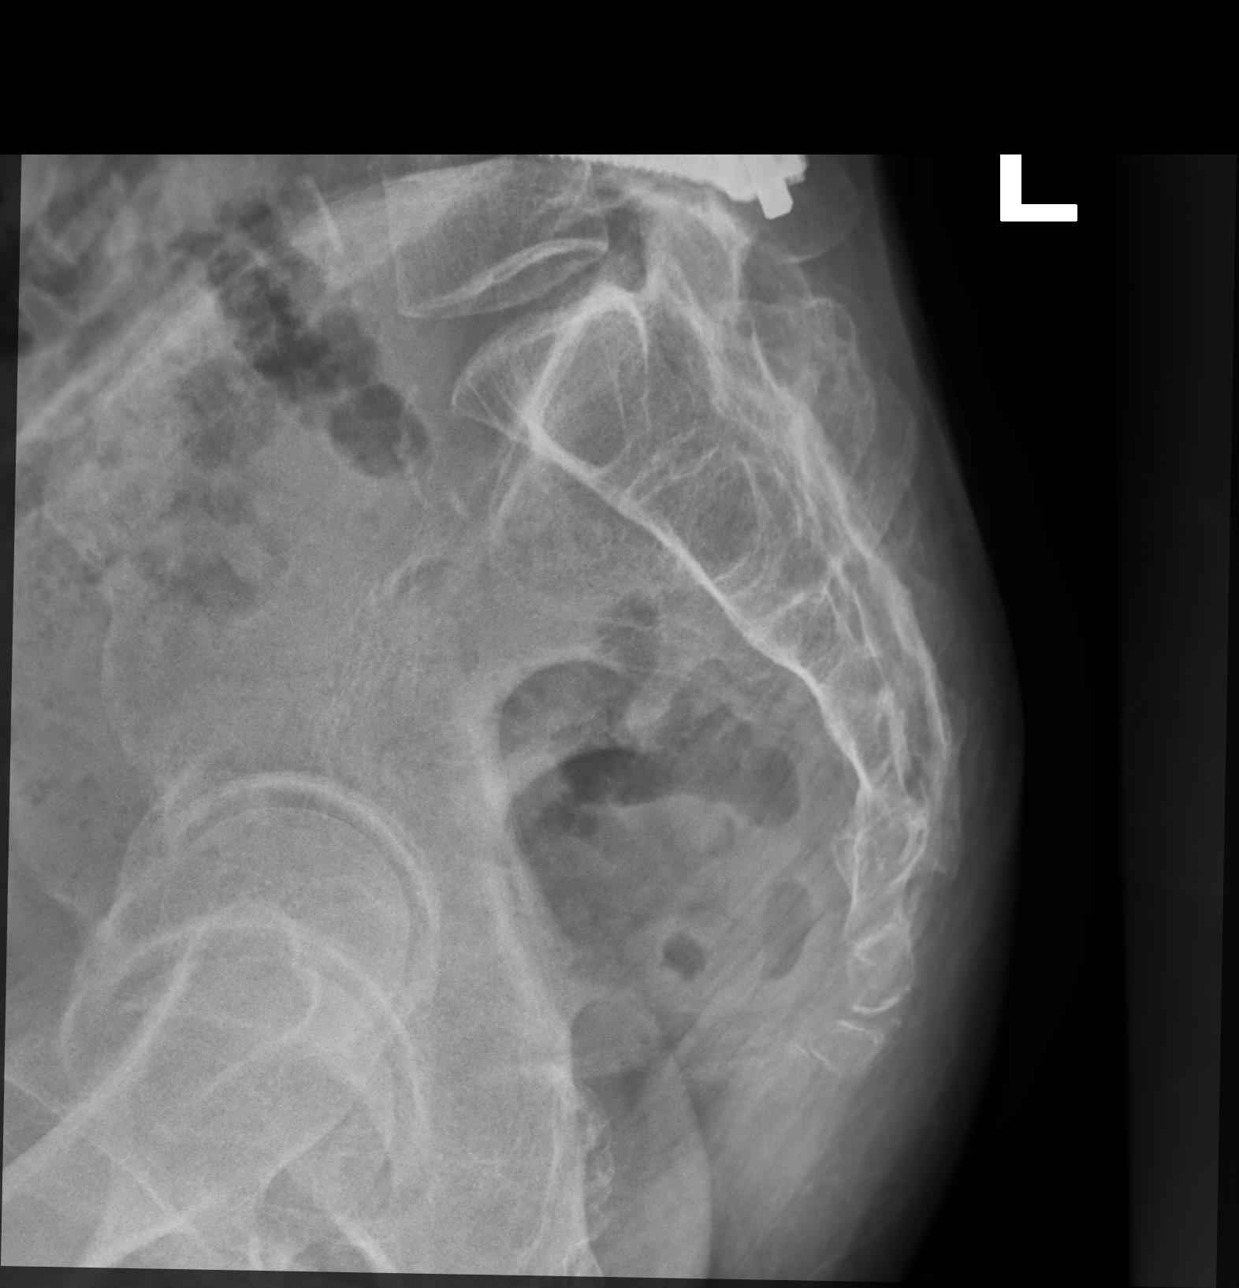

[5 of 5 positions shown; findings below may reference images not displayed]

FINDINGS: Normal lumbar segmentation. Previous L4-L5 posterior and interbody
fusion. At L5 the tip of the pedicle screws is in proximity to the
superior endplate. Suggestion of posterior element arthrodesis at
that level. Hardware appears intact. Stable vertebral height and
alignment. Stable disc spaces elsewhere. No acute osseous
abnormality identified. Sacral ala and SI joints appear negative.
Calcified aortic atherosclerosis. Negative visible bowel gas
pattern.
IMPRESSION: 1. No acute osseous abnormality identified in the lumbar spine.
Postoperative changes appears stable from the 5756 MRI.
2. Aortic Atherosclerosis ([id number]).

## 2020-03-19 ENCOUNTER — Other Ambulatory Visit (INDEPENDENT_AMBULATORY_CARE_PROVIDER_SITE_OTHER): Payer: Medicare Other

## 2020-03-19 ENCOUNTER — Other Ambulatory Visit: Payer: Self-pay

## 2020-03-19 DIAGNOSIS — E559 Vitamin D deficiency, unspecified: Secondary | ICD-10-CM

## 2020-03-19 DIAGNOSIS — E538 Deficiency of other specified B group vitamins: Secondary | ICD-10-CM | POA: Diagnosis not present

## 2020-03-19 DIAGNOSIS — R7302 Impaired glucose tolerance (oral): Secondary | ICD-10-CM

## 2020-03-19 DIAGNOSIS — E782 Mixed hyperlipidemia: Secondary | ICD-10-CM | POA: Diagnosis not present

## 2020-03-19 DIAGNOSIS — M797 Fibromyalgia: Secondary | ICD-10-CM | POA: Diagnosis not present

## 2020-03-19 DIAGNOSIS — M130 Polyarthritis, unspecified: Secondary | ICD-10-CM | POA: Diagnosis not present

## 2020-03-19 LAB — VITAMIN B12: Vitamin B-12: 632 pg/mL (ref 211–911)

## 2020-03-19 LAB — CBC WITH DIFFERENTIAL/PLATELET
Basophils Absolute: 0.1 10*3/uL (ref 0.0–0.1)
Basophils Relative: 0.6 % (ref 0.0–3.0)
Eosinophils Absolute: 0.9 10*3/uL — ABNORMAL HIGH (ref 0.0–0.7)
Eosinophils Relative: 9.4 % — ABNORMAL HIGH (ref 0.0–5.0)
HCT: 39.4 % (ref 36.0–46.0)
Hemoglobin: 13.5 g/dL (ref 12.0–15.0)
Lymphocytes Relative: 30.1 % (ref 12.0–46.0)
Lymphs Abs: 2.7 10*3/uL (ref 0.7–4.0)
MCHC: 34.2 g/dL (ref 30.0–36.0)
MCV: 92.9 fl (ref 78.0–100.0)
Monocytes Absolute: 0.7 10*3/uL (ref 0.1–1.0)
Monocytes Relative: 7.6 % (ref 3.0–12.0)
Neutro Abs: 4.7 10*3/uL (ref 1.4–7.7)
Neutrophils Relative %: 52.3 % (ref 43.0–77.0)
Platelets: 337 10*3/uL (ref 150.0–400.0)
RBC: 4.24 Mil/uL (ref 3.87–5.11)
RDW: 13.1 % (ref 11.5–15.5)
WBC: 9 10*3/uL (ref 4.0–10.5)

## 2020-03-19 LAB — VITAMIN D 25 HYDROXY (VIT D DEFICIENCY, FRACTURES): VITD: 30.46 ng/mL (ref 30.00–100.00)

## 2020-03-19 LAB — COMPREHENSIVE METABOLIC PANEL
ALT: 15 U/L (ref 0–35)
AST: 17 U/L (ref 0–37)
Albumin: 4.5 g/dL (ref 3.5–5.2)
Alkaline Phosphatase: 79 U/L (ref 39–117)
BUN: 18 mg/dL (ref 6–23)
CO2: 29 mEq/L (ref 19–32)
Calcium: 9.7 mg/dL (ref 8.4–10.5)
Chloride: 100 mEq/L (ref 96–112)
Creatinine, Ser: 0.7 mg/dL (ref 0.40–1.20)
GFR: 82.92 mL/min (ref >60.00)
Glucose, Bld: 121 mg/dL — ABNORMAL HIGH (ref 70–99)
Potassium: 4.2 mEq/L (ref 3.5–5.1)
Sodium: 138 mEq/L (ref 135–145)
Total Bilirubin: 0.5 mg/dL (ref 0.2–1.2)
Total Protein: 6.9 g/dL (ref 6.0–8.3)

## 2020-03-19 LAB — HEMOGLOBIN A1C: Hgb A1c MFr Bld: 6 % (ref 4.6–6.5)

## 2020-03-20 ENCOUNTER — Other Ambulatory Visit: Payer: Self-pay | Admitting: Internal Medicine

## 2020-03-20 ENCOUNTER — Encounter: Payer: Self-pay | Admitting: Internal Medicine

## 2020-03-20 DIAGNOSIS — R7302 Impaired glucose tolerance (oral): Secondary | ICD-10-CM | POA: Insufficient documentation

## 2020-03-20 DIAGNOSIS — R7989 Other specified abnormal findings of blood chemistry: Secondary | ICD-10-CM

## 2020-03-20 LAB — LIPID PANEL
Cholesterol: 231 mg/dL — ABNORMAL HIGH (ref 0–200)
HDL: 83 mg/dL (ref >39.00)
LDL Cholesterol: 125 mg/dL — ABNORMAL HIGH (ref 0–99)
NonHDL: 148.06
Total CHOL/HDL Ratio: 3
Triglycerides: 113 mg/dL (ref 0.0–149.0)
VLDL: 22.6 mg/dL (ref 0.0–40.0)

## 2020-03-20 MED ORDER — VITAMIN D (ERGOCALCIFEROL) 1.25 MG (50000 UNIT) PO CAPS: 50000.0000 [IU] | ORAL_CAPSULE | ORAL | 0 refills | Status: AC

## 2020-03-22 DIAGNOSIS — F4322 Adjustment disorder with anxiety: Secondary | ICD-10-CM | POA: Diagnosis not present

## 2020-03-23 ENCOUNTER — Telehealth: Payer: Self-pay | Admitting: Internal Medicine

## 2020-03-23 DIAGNOSIS — R739 Hyperglycemia, unspecified: Secondary | ICD-10-CM

## 2020-03-23 NOTE — Telephone Encounter (Signed)
Okay to refer to endo for DM? Patient has an appointment 03/28/20

## 2020-03-23 NOTE — Telephone Encounter (Signed)
Spoke with patient and she has concerns with her Vitamin D level.  She was unavailable  to complete her conversation and will call back.

## 2020-03-23 NOTE — Telephone Encounter (Signed)
Pt called in stating that she is returning Rachel's call and did not want to give any information to me about her medication and wanted to speak with Fleet Contras or Dr. Ardyth Harps.  Pt would like to have a call back today and she is aware that the provider and Fleet Contras is with pt and will try to give her a call back.

## 2020-03-27 NOTE — Addendum Note (Signed)
Addended by: Kern Reap B on: 03/27/2020 08:08 AM   Modules accepted: Orders

## 2020-03-27 NOTE — Telephone Encounter (Signed)
Ok for endo referral.

## 2020-03-28 ENCOUNTER — Ambulatory Visit (INDEPENDENT_AMBULATORY_CARE_PROVIDER_SITE_OTHER): Payer: Medicare Other | Admitting: Internal Medicine

## 2020-03-28 ENCOUNTER — Encounter: Payer: Self-pay | Admitting: Internal Medicine

## 2020-03-28 ENCOUNTER — Other Ambulatory Visit: Payer: Self-pay

## 2020-03-28 VITALS — BP 110/78 | HR 64 | Temp 97.4°F | Wt 99.4 lb

## 2020-03-28 DIAGNOSIS — G894 Chronic pain syndrome: Secondary | ICD-10-CM

## 2020-03-28 DIAGNOSIS — E559 Vitamin D deficiency, unspecified: Secondary | ICD-10-CM | POA: Diagnosis not present

## 2020-03-28 DIAGNOSIS — E782 Mixed hyperlipidemia: Secondary | ICD-10-CM | POA: Diagnosis not present

## 2020-03-28 DIAGNOSIS — R7302 Impaired glucose tolerance (oral): Secondary | ICD-10-CM

## 2020-03-28 MED ORDER — ATORVASTATIN CALCIUM 20 MG PO TABS: 20.0000 mg | ORAL_TABLET | Freq: Every day | ORAL | 1 refills | Status: DC

## 2020-03-28 NOTE — Progress Notes (Signed)
Established Patient Office Visit     This visit occurred during the SARS-CoV-2 public health emergency.  Safety protocols were in place, including screening questions prior to the visit, additional usage of staff PPE, and extensive cleaning of exam room while observing appropriate contact time as indicated for disinfecting solutions.    CC/Reason for Visit: Lab follow-up, discuss chronic pain  HPI: Patricia Underwood is a 69 y.o. female who is coming in today for the above mentioned reasons.  She was seen earlier this month for her physical and had some lab abnormalities that she wanted to discuss with me:  1.  Vitamin D deficiency.  She was advised to take weekly high-dose supplementation for 3 months and then follow-up for repeat lab draw.  2.  She was found to have impaired glucose tolerance with an A1c of 6.  She wanted to know what this means.  3.  Her LDL cholesterol had increased significantly over the past year.  She had stopped taking her Lipitor, I was not aware of this.  In addition she was to discuss her chronic pain.  She has been followed [facility] by the [facility].  She had been on high-dose oxycodone.  When she transferred her care to me I agreed to take over her pain contract.  After she was given her first 3 months, unbeknownst to me, she went back to [facility] and requested to get off the oxycodone because she was going through a divorce and she felt like it clouded her memory and she was transferred over to Suboxone.  However she started having increased pain and went back to the pain clinic and was switched over to oxycodone and was given 1 month's prescription.  Now she wants to transfer her oxycodone contract back to me.   Past Medical/Surgical History: Past Medical History:  Diagnosis Date  . B12 deficiency, Rx B12 injections 07/05/2017  . Chronic back pain   . Chronic right shoulder pain 05/30/2017  . Fibromyalgia   . HLD  (hyperlipidemia) 05/30/2017    Past Surgical History:  Procedure Laterality Date  . ABDOMINAL HYSTERECTOMY    . KNEE SURGERY Right   . LUMBAR FUSION     L4-L5  . SHOULDER ARTHROSCOPY Right     Social History:  reports that she has quit smoking. She has never used smokeless tobacco. She reports current alcohol use of about 7.0 standard drinks of alcohol per week. She reports that she does not use drugs.  Allergies: Allergies  Allergen Reactions  . Ambien [Zolpidem Tartrate]   . Flexeril [Cyclobenzaprine] Other (See Comments)    Patient unsure of reaction  . Naproxen Other (See Comments)    Patient unsure of reaction.    Family History:  Family History  Family history unknown: Yes     Current Outpatient Medications:  .  albuterol (VENTOLIN HFA) 108 (90 Base) MCG/ACT inhaler, INHALE 2 PUFFS INTO THE LUNGS EVERY 6 HOURS AS NEEDED FOR WHEEZING OR FOR SHORTNESS OF BREATH, Disp: 18 g, Rfl: 2 .  atorvastatin (LIPITOR) 20 MG tablet, Take 20 mg by mouth at bedtime. , Disp: , Rfl:  .  cyanocobalamin (,VITAMIN B-12,) 1000 MCG/ML injection, 1000 mcg (1 mg) injection once every other week, Disp: 6 mL, Rfl: 3 .  diazepam (VALIUM) 5 MG tablet, Take 1 tablet (5 mg total) by mouth at bedtime as needed for anxiety., Disp: 30 tablet, Rfl: 1 .  OVER THE COUNTER MEDICATION, Formula 7  solution - for toenails, Disp: , Rfl:  .  pantoprazole (PROTONIX) 40 MG tablet, Take 1 tablet (40 mg total) by mouth 2 (two) times daily., Disp: 60 tablet, Rfl: 2 .  pilocarpine (SALAGEN) 5 MG tablet, Take 5 mg by mouth. 3 times a week as needed, Disp: , Rfl:  .  Syringe/Needle, Disp, (SYRINGE 3CC/25GX1") 25G X 1" 3 ML MISC, 1 application by Does not apply route once a week., Disp: 12 each, Rfl: 3 .  tretinoin (RETIN-A) 0.1 % cream, Apply topically at bedtime., Disp: 45 g, Rfl: 2 .  Vitamin D, Ergocalciferol, (DRISDOL) 1.25 MG (50000 UNIT) CAPS capsule, Take 1 capsule (50,000 Units total) by mouth every 7 (seven) days  for 12 doses., Disp: 12 capsule, Rfl: 0  Review of Systems:  Constitutional: Denies fever, chills, diaphoresis, appetite change and fatigue.  HEENT: Denies photophobia, eye pain, redness, hearing loss, ear pain, congestion, sore throat, rhinorrhea, sneezing, mouth sores, trouble swallowing, neck pain, neck stiffness and tinnitus.   Respiratory: Denies SOB, DOE, cough, chest tightness,  and wheezing.   Cardiovascular: Denies chest pain, palpitations and leg swelling.  Gastrointestinal: Denies nausea, vomiting, abdominal pain, diarrhea, constipation, blood in stool and abdominal distention.  Genitourinary: Denies dysuria, urgency, frequency, hematuria, flank pain and difficulty urinating.  Endocrine: Denies: hot or cold intolerance, sweats, changes in hair or nails, polyuria, polydipsia. Musculoskeletal: Denies  joint swelling, arthralgias and gait problem.  Skin: Denies pallor, rash and wound.  Neurological: Denies dizziness, seizures, syncope, weakness, light-headedness, numbness and headaches.  Hematological: Denies adenopathy. Easy bruising, personal or family bleeding history  Psychiatric/Behavioral: Denies suicidal ideation, mood changes, confusion, nervousness, sleep disturbance and agitation    Physical Exam: Vitals:   03/28/20 0831  BP: 110/78  Pulse: 64  Temp: (!) 97.4 F (36.3 C)  TempSrc: Temporal  SpO2: 99%  Weight: 99 lb 6.4 oz (45.1 kg)    Body mass index is 17.61 kg/m.   Constitutional: NAD, calm, comfortable Eyes: PERRL, lids and conjunctivae normal, wears corrective lenses ENMT: Mucous membranes are moist.  Neurologic: Grossly intact and nonfocal Psychiatric: Normal judgment and insight. Alert and oriented x 3. Normal mood.    Impression and Plan:  IGT (impaired glucose tolerance) -Her A1c was 6.0.  We discussed diet modification and every 6 month monitoring of A1c to hopefully prevent progression to full-blown diabetes.  Chronic pain syndrome -Explained  to her that I do not feel entirely comfortable taking over her pain contract given the back and forth between myself and the pain clinic. -Her PDMP has been reviewed, she has a very high overdose risk score of 550.  She also has 2 red flags: 1 for amount of providers and another one for the high dose of morphine equivalent dosing. -I will request records from [facility] to review their notes in detail. -She has been made aware that I have not yet made a decision about this and it is entirely possible that I will not take over her pain contract at this time.  Vitamin D deficiency -She will complete 3 months of weekly high-dose supplementation and return for follow-up labs.  Mixed hyperlipidemia -She will go back on Lipitor 20 mg, prescription sent.     Chaya Jan, MD [facility] at [facility]

## 2020-03-29 ENCOUNTER — Telehealth: Payer: Self-pay | Admitting: *Deleted

## 2020-03-29 DIAGNOSIS — F4322 Adjustment disorder with anxiety: Secondary | ICD-10-CM | POA: Diagnosis not present

## 2020-03-29 NOTE — Telephone Encounter (Signed)
Left message on machine for patient to return our call 

## 2020-03-29 NOTE — Telephone Encounter (Signed)
-----   Message from Estela Y Hernandez Acosta, MD sent at 03/29/2020  6:50 AM EDT ----- Please let Ms Morel know that I will not be taking over her pain contract. Since she is actively seen at the [facility], best if she continues her care there. Of course, I will continue to see her for her primary care needs.  EH  

## 2020-03-29 NOTE — Telephone Encounter (Signed)
-----   Message from Henderson Cloud, MD sent at 03/29/2020  6:50 AM EDT ----- Please let Patricia Underwood know that I will not be taking over her pain contract. Since she is actively seen at the [facility], best if she continues her care there. Of course, I will continue to see her for her primary care needs.  EH

## 2020-03-29 NOTE — Telephone Encounter (Signed)
Pt is aware that Fleet Contras will give her a call back

## 2020-03-29 NOTE — Telephone Encounter (Signed)
FYI  Patient is aware.  Patient said "I see.  Thank you very much".

## 2020-04-04 DIAGNOSIS — F4322 Adjustment disorder with anxiety: Secondary | ICD-10-CM | POA: Diagnosis not present

## 2020-04-10 DIAGNOSIS — F4322 Adjustment disorder with anxiety: Secondary | ICD-10-CM | POA: Diagnosis not present

## 2020-04-17 DIAGNOSIS — F4322 Adjustment disorder with anxiety: Secondary | ICD-10-CM | POA: Diagnosis not present

## 2020-04-24 ENCOUNTER — Telehealth: Payer: Self-pay | Admitting: Internal Medicine

## 2020-04-24 NOTE — Telephone Encounter (Signed)
Patient states she thinks she received a call from [facility] today but couldn't hear.  Patient is asking for a call back to let her know if you called or not either way.  Ok, to leave her a message.

## 2020-04-24 NOTE — Telephone Encounter (Signed)
Left detailed message on machine per patient request that our office did not call her.

## 2020-04-26 DIAGNOSIS — F4322 Adjustment disorder with anxiety: Secondary | ICD-10-CM | POA: Diagnosis not present

## 2020-05-10 ENCOUNTER — Ambulatory Visit (INDEPENDENT_AMBULATORY_CARE_PROVIDER_SITE_OTHER): Payer: Medicare Other | Admitting: Endocrinology

## 2020-05-10 ENCOUNTER — Other Ambulatory Visit: Payer: Self-pay

## 2020-05-10 ENCOUNTER — Encounter: Payer: Self-pay | Admitting: Endocrinology

## 2020-05-10 ENCOUNTER — Telehealth: Payer: Self-pay | Admitting: Internal Medicine

## 2020-05-10 VITALS — BP 112/64 | HR 98 | Ht 63.0 in | Wt 95.6 lb

## 2020-05-10 DIAGNOSIS — R7302 Impaired glucose tolerance (oral): Secondary | ICD-10-CM | POA: Diagnosis not present

## 2020-05-10 DIAGNOSIS — R739 Hyperglycemia, unspecified: Secondary | ICD-10-CM | POA: Diagnosis not present

## 2020-05-10 LAB — POCT GLYCOSYLATED HEMOGLOBIN (HGB A1C): Hemoglobin A1C: 5.5 % (ref 4.0–5.6)

## 2020-05-10 NOTE — Telephone Encounter (Signed)
Patient called in asking to do a TOC from Dr.Hernandez to Dr.Parker because she feels she is not getting the best of care from her and her husband sees Dr.Parker so she feels as if it would be easier to see the same provider.

## 2020-05-10 NOTE — Patient Instructions (Addendum)
Given that you are not overweight, you do not need to worry about your diet.  You should have the A1c checked every 3 months Please come back for a follow-up appointment in 3 months.

## 2020-05-10 NOTE — Progress Notes (Signed)
Subjective:    Patient ID: Patricia Underwood, female    DOB: [birth year], 69 y.o.   MRN: [id number]  HPI Pt is ref by Dr Rexene Edison. A.  She was noted to have hyperglycemia in 2019.  Main symptom is fatigue.  No h/o pancreatitis or pancreatic surgery.  She has never had pancreatic imaging.   Past Medical History:  Diagnosis Date  . B12 deficiency, Rx B12 injections 07/05/2017  . Chronic back pain   . Chronic right shoulder pain 05/30/2017  . Fibromyalgia   . HLD (hyperlipidemia) 05/30/2017    Past Surgical History:  Procedure Laterality Date  . ABDOMINAL HYSTERECTOMY    . KNEE SURGERY Right   . LUMBAR FUSION     L4-L5  . SHOULDER ARTHROSCOPY Right     Social History   Socioeconomic History  . Marital status: Married    Spouse name: Not on file  . Number of children: Not on file  . Years of education: Not on file  . Highest education level: Not on file  Occupational History  . Occupation: Retired   Tobacco Use  . Smoking status: Former Games developer  . Smokeless tobacco: Never Used  Substance and Sexual Activity  . Alcohol use: Yes    Alcohol/week: 7.0 standard drinks    Types: 7 Glasses of wine per week  . Drug use: No  . Sexual activity: Not Currently    Partners: Male    Birth control/protection: None  Other Topics Concern  . Not on file  Social History Narrative  . Not on file   Social Determinants of Health   Financial Resource Strain:   . Difficulty of Paying Living Expenses:   Food Insecurity:   . Worried About Programme researcher, broadcasting/film/video in the Last Year:   . Barista in the Last Year:   Transportation Needs:   . Freight forwarder (Medical):   [name] Lack of Transportation (Non-Medical):   Physical Activity:   . Days of Exercise per Week:   . Minutes of Exercise per Session:   Stress:   . Feeling of Stress :   Social Connections:   . Frequency of Communication with Friends and Family:   . Frequency of Social Gatherings with Friends and Family:   . Attends Religious  Services:   . Active Member of Clubs or Organizations:   . Attends Banker Meetings:   [name] Marital Status:   Intimate Partner Violence:   . Fear of Current or Ex-Partner:   . Emotionally Abused:   [name] Physically Abused:   . Sexually Abused:     Current Outpatient Medications on File Prior to Visit  Medication Sig Dispense Refill  . albuterol (VENTOLIN HFA) 108 (90 Base) MCG/ACT inhaler INHALE 2 PUFFS INTO THE LUNGS EVERY 6 HOURS AS NEEDED FOR WHEEZING OR FOR SHORTNESS OF BREATH 18 g 2  . atorvastatin (LIPITOR) 20 MG tablet Take 1 tablet (20 mg total) by mouth at bedtime. 90 tablet 1  . cyanocobalamin (,VITAMIN B-12,) 1000 MCG/ML injection 1000 mcg (1 mg) injection once every other week 6 mL 3  . diazepam (VALIUM) 5 MG tablet Take 1 tablet (5 mg total) by mouth at bedtime as needed for anxiety. 30 tablet 1  . pilocarpine (SALAGEN) 5 MG tablet Take 5 mg by mouth. 3 times a week as needed    . Syringe/Needle, Disp, (SYRINGE 3CC/25GX1") 25G X 1" 3 ML MISC 1 application by Does not apply route once a week.  12 each 3  . Vitamin D, Ergocalciferol, (DRISDOL) 1.25 MG (50000 UNIT) CAPS capsule Take 1 capsule (50,000 Units total) by mouth every 7 (seven) days for 12 doses. 12 capsule 0   No current facility-administered medications on file prior to visit.    Allergies  Allergen Reactions  . Ambien [Zolpidem Tartrate]   . Flexeril [Cyclobenzaprine] Other (See Comments)    Patient unsure of reaction  . Naproxen Other (See Comments)    Patient unsure of reaction.    Family History  Problem Relation Age of Onset  . Diabetes Neg Hx     BP 112/64   Pulse 98   Ht 5\' 3"  (1.6 m)   Wt 95 lb 9.6 oz (43.4 kg)   SpO2 98%   BMI 16.93 kg/m    Review of Systems denies blurry vision, n/v, and memory loss.      Objective:   Physical Exam VS: see vs page GEN: no distress.  Lean body habitus.   HEAD: head: no deformity eyes: no periorbital swelling, no proptosis external nose and  ears are normal NECK: supple, thyroid is not enlarged CHEST WALL: no deformity LUNGS: clear to auscultation CV: reg rate and rhythm, no murmur.  MUSCULOSKELETAL: muscle bulk and strength are grossly normal.  no obvious joint swelling.  gait is normal and steady.   EXTEMITIES: no deformity.  no edema PULSES: no carotid bruit NEURO:  cn 2-12 grossly intact.   readily moves all 4's.  sensation is intact to touch on all 4's. SKIN:  Normal texture and temperature.  No rash or suspicious lesion is visible.   NODES:  None palpable at the neck.   PSYCH: alert, well-oriented.  Does not appear anxious nor depressed.      Lab Results  Component Value Date   HGBA1C 5.5 05/10/2020    Lab Results  Component Value Date   CREATININE 0.70 03/19/2020   BUN 18 03/19/2020   NA 138 03/19/2020   K 4.2 03/19/2020   CL 100 03/19/2020   CO2 29 03/19/2020   Lab Results  Component Value Date   CHOL 231 (H) 03/20/2020   HDL 83.00 03/20/2020   LDLCALC 125 (H) 03/20/2020   TRIG 113.0 03/20/2020   CHOLHDL 3 03/20/2020   I have reviewed outside records, and summarized: Pt was noted to have elevated A1c, and referred here.  She was also seen for Sicca synd.        Assessment & Plan:  Hyperglycemia, new to me.  Sicca syndrome: this and lean body habitus suggest pt has autoimmune cause of hyperglycemia  Patient Instructions  Given that you are not overweight, you do not need to worry about your diet.  You should have the A1c checked every 3 months Please come back for a follow-up appointment in 3 months.

## 2020-05-16 ENCOUNTER — Telehealth: Payer: Self-pay | Admitting: Internal Medicine

## 2020-05-16 DIAGNOSIS — F5102 Adjustment insomnia: Secondary | ICD-10-CM

## 2020-05-16 NOTE — Telephone Encounter (Signed)
Pt is calling in stating that she is out of diazepam (VALIUM) 5 MG   Ph[facility]:  [facility] [address]

## 2020-05-16 NOTE — Telephone Encounter (Signed)
Ok with me 

## 2020-05-17 MED ORDER — DIAZEPAM 5 MG PO TABS: 5.0000 mg | ORAL_TABLET | Freq: Every evening | ORAL | 0 refills | Status: DC | PRN

## 2020-05-17 NOTE — Telephone Encounter (Signed)
Rx faxed and confirmed.

## 2020-05-17 NOTE — Telephone Encounter (Signed)
Ok with me 

## 2020-05-17 NOTE — Telephone Encounter (Signed)
Patient is scheduled for 09/07/2020

## 2020-05-17 NOTE — Telephone Encounter (Signed)
Dr. Jimmey Ralph, will you accept this pt you see her husband?

## 2020-05-17 NOTE — Addendum Note (Signed)
Addended by: Kern Reap B on: 05/17/2020 12:03 PM   Modules accepted: Orders

## 2020-05-17 NOTE — Telephone Encounter (Signed)
Ok to refill for 30 days  

## 2020-05-30 DIAGNOSIS — M130 Polyarthritis, unspecified: Secondary | ICD-10-CM | POA: Diagnosis not present

## 2020-05-30 DIAGNOSIS — M797 Fibromyalgia: Secondary | ICD-10-CM | POA: Diagnosis not present

## 2020-05-30 DIAGNOSIS — Z79899 Other long term (current) drug therapy: Secondary | ICD-10-CM | POA: Diagnosis not present

## 2020-06-04 ENCOUNTER — Other Ambulatory Visit: Payer: Self-pay

## 2020-06-04 ENCOUNTER — Encounter: Payer: Self-pay | Admitting: Family Medicine

## 2020-06-04 ENCOUNTER — Ambulatory Visit: Payer: Medicare Other | Admitting: Family Medicine

## 2020-06-04 ENCOUNTER — Ambulatory Visit (INDEPENDENT_AMBULATORY_CARE_PROVIDER_SITE_OTHER): Payer: Medicare Other | Admitting: Family Medicine

## 2020-06-04 VITALS — BP 110/62 | HR 67 | Temp 98.3°F | Ht 63.0 in | Wt 101.0 lb

## 2020-06-04 DIAGNOSIS — R7989 Other specified abnormal findings of blood chemistry: Secondary | ICD-10-CM

## 2020-06-04 DIAGNOSIS — R634 Abnormal weight loss: Secondary | ICD-10-CM

## 2020-06-04 DIAGNOSIS — E538 Deficiency of other specified B group vitamins: Secondary | ICD-10-CM

## 2020-06-04 NOTE — Progress Notes (Signed)
   Patricia Underwood is a 69 y.o. female who presents today for an office visit.  Assessment/Plan:  New/Acute Problems: Unintentional weight loss Check labs including CBC, CMET, TSH, CRP, sed rate, and urinalysis.  Discussed importance of maintaining caloric intake and discussed sources of protein and fat.  If above work-up is negative will need repeat lung cancer screening.  She had several small pulmonary nodules on high-resolution CT scan 2 years ago.  Elevated CK Found on lab work from last year.  Could be a part of what is causing her above symptoms.  Recheck CK today.  If continues to be elevated will likely need to stop statin and possibly refer to rheumatology.    Subjective:  HPI:  Patient here today with concerns for several symptoms including weight loss, insomnia, fatigue, and myalgias.  Symptoms started several years ago but it significantly worsened within the last 6 months or so.  She has lost about 25 pounds over the last year or so.  She has not tried to lose weight.  She has not made any changes to medications.  She says that she has no appetite.  No abdominal pain.  She says minimal activity such as making a pot of coffee will exhaust her.  No constipation or diarrhea.  No fevers or chills.  No recent illnesses.  Recently saw endocrinologist due to concern for hypoglycemia.  A1c was 5.5.       Objective:  Physical Exam: BP (!) 110/62   Pulse 67   Temp 98.3 F (36.8 C) (Temporal)   Ht 5\' 3"  (1.6 m)   Wt 101 lb (45.8 kg)   SpO2 97%   BMI 17.89 kg/m   Wt Readings from Last 3 Encounters:  06/04/20 101 lb (45.8 kg)  05/10/20 95 lb 9.6 oz (43.4 kg)  03/28/20 99 lb 6.4 oz (45.1 kg)  Gen: No acute distress, resting comfortably CV: Regular rate and rhythm with no murmurs appreciated Pulm: Normal work of breathing, clear to auscultation bilaterally with no crackles, wheezes, or rhonchi Neuro: Grossly normal, moves all extremities Psych: Normal affect and thought  content  Time Spent: 45 minutes of total time was spent on the date of the encounter performing the following actions: chart review prior to seeing the patient including recent visits with previous PCP, endocrinology, and sports medicine, obtaining history, performing a medically necessary exam, counseling on the treatment plan, placing orders, and documenting in our EHR.        03/30/20. Katina Degree, MD 06/04/2020 1:40 PM

## 2020-06-04 NOTE — Addendum Note (Signed)
Addended by: Laddie Aquas A on: 06/04/2020 01:43 PM   Modules accepted: Orders

## 2020-06-04 NOTE — Patient Instructions (Addendum)
It was very nice to see you today!  We will check blood work and urine sample today.  Please try to make sure you are getting as much calories in your skin.  You need plenty of protein in your diet as well.  We will contact you with lab results once they are available.  Take care, Dr Jimmey Ralph  Please try these tips to maintain a healthy lifestyle:   Eat at least 3 REAL meals and 1-2 snacks per day.  Aim for no more than 5 hours between eating.  If you eat breakfast, please do so within one hour of getting up.    Each meal should contain half fruits/vegetables, one quarter protein, and one quarter carbs (no bigger than a computer mouse)   Cut down on sweet beverages. This includes juice, soda, and sweet tea.     Drink at least 1 glass of water with each meal and aim for at least 8 glasses per day   Exercise at least 150 minutes every week.

## 2020-06-05 LAB — CBC WITH DIFFERENTIAL/PLATELET
Absolute Monocytes: 561 cells/uL (ref 200–950)
Basophils Absolute: 71 cells/uL (ref 0–200)
Basophils Relative: 1 %
Eosinophils Absolute: 114 cells/uL (ref 15–500)
Eosinophils Relative: 1.6 %
HCT: 38 % (ref 35.0–45.0)
Hemoglobin: 12.8 g/dL (ref 11.7–15.5)
Lymphs Abs: 2584 cells/uL (ref 850–3900)
MCH: 31.5 pg (ref 27.0–33.0)
MCHC: 33.7 g/dL (ref 32.0–36.0)
MCV: 93.6 fL (ref 80.0–100.0)
MPV: 9.6 fL (ref 7.5–12.5)
Monocytes Relative: 7.9 %
Neutro Abs: 3770 cells/uL (ref 1500–7800)
Neutrophils Relative %: 53.1 %
Platelets: 295 10*3/uL (ref 140–400)
RBC: 4.06 10*6/uL (ref 3.80–5.10)
RDW: 12 % (ref 11.0–15.0)
Total Lymphocyte: 36.4 %
WBC: 7.1 10*3/uL (ref 3.8–10.8)

## 2020-06-05 LAB — URINALYSIS, ROUTINE W REFLEX MICROSCOPIC
Bilirubin Urine: NEGATIVE
Glucose, UA: NEGATIVE
Hgb urine dipstick: NEGATIVE
Ketones, ur: NEGATIVE
Leukocytes,Ua: NEGATIVE
Nitrite: NEGATIVE
Protein, ur: NEGATIVE
Specific Gravity, Urine: 1.019 (ref 1.001–1.03)
pH: 7 (ref 5.0–8.0)

## 2020-06-05 LAB — COMPREHENSIVE METABOLIC PANEL
AG Ratio: 1.9 (calc) (ref 1.0–2.5)
ALT: 18 U/L (ref 6–29)
AST: 21 U/L (ref 10–35)
Albumin: 4.3 g/dL (ref 3.6–5.1)
Alkaline phosphatase (APISO): 75 U/L (ref 37–153)
BUN: 17 mg/dL (ref 7–25)
CO2: 31 mmol/L (ref 20–32)
Calcium: 9.4 mg/dL (ref 8.6–10.4)
Chloride: 103 mmol/L (ref 98–110)
Creat: 0.67 mg/dL (ref 0.50–0.99)
Globulin: 2.3 g/dL (calc) (ref 1.9–3.7)
Glucose, Bld: 92 mg/dL (ref 65–99)
Potassium: 4.6 mmol/L (ref 3.5–5.3)
Sodium: 140 mmol/L (ref 135–146)
Total Bilirubin: 0.5 mg/dL (ref 0.2–1.2)
Total Protein: 6.6 g/dL (ref 6.1–8.1)

## 2020-06-05 LAB — CK: Total CK: 82 U/L (ref 29–143)

## 2020-06-05 LAB — SEDIMENTATION RATE: Sed Rate: 2 mm/h (ref 0–30)

## 2020-06-05 LAB — TSH: TSH: 1.27 mIU/L (ref 0.40–4.50)

## 2020-06-05 LAB — C-REACTIVE PROTEIN: CRP: 2.2 mg/L (ref <8.0)

## 2020-06-06 ENCOUNTER — Other Ambulatory Visit: Payer: Self-pay | Admitting: Family Medicine

## 2020-06-06 DIAGNOSIS — R911 Solitary pulmonary nodule: Secondary | ICD-10-CM

## 2020-06-06 DIAGNOSIS — F4322 Adjustment disorder with anxiety: Secondary | ICD-10-CM | POA: Diagnosis not present

## 2020-06-06 NOTE — Progress Notes (Signed)
Please inform patient of the following:  Blood work and urinalysis all within normal ranges - no clear explanation for her weight loss and fatigue We need to get a CT scan of her lungs - we discussed this at her office visit. I will place the order.  Katina Degree. Jimmey Ralph, MD 06/06/2020 11:58 AM

## 2020-06-14 ENCOUNTER — Encounter: Payer: Medicare Other | Admitting: Family Medicine

## 2020-06-27 DIAGNOSIS — M130 Polyarthritis, unspecified: Secondary | ICD-10-CM | POA: Diagnosis not present

## 2020-06-27 DIAGNOSIS — F5104 Psychophysiologic insomnia: Secondary | ICD-10-CM | POA: Diagnosis not present

## 2020-06-27 DIAGNOSIS — M797 Fibromyalgia: Secondary | ICD-10-CM | POA: Diagnosis not present

## 2020-06-27 DIAGNOSIS — Z79899 Other long term (current) drug therapy: Secondary | ICD-10-CM | POA: Diagnosis not present

## 2020-07-25 DIAGNOSIS — M797 Fibromyalgia: Secondary | ICD-10-CM | POA: Diagnosis not present

## 2020-07-25 DIAGNOSIS — M130 Polyarthritis, unspecified: Secondary | ICD-10-CM | POA: Diagnosis not present

## 2020-07-25 DIAGNOSIS — Z79899 Other long term (current) drug therapy: Secondary | ICD-10-CM | POA: Diagnosis not present

## 2020-07-25 DIAGNOSIS — F5104 Psychophysiologic insomnia: Secondary | ICD-10-CM | POA: Diagnosis not present

## 2020-07-27 ENCOUNTER — Other Ambulatory Visit: Payer: Self-pay

## 2020-07-27 ENCOUNTER — Ambulatory Visit (HOSPITAL_COMMUNITY)
Admission: RE | Admit: 2020-07-27 | Discharge: 2020-07-27 | Disposition: A | Discharge status: Home / Self Care | Payer: Medicare Other | Source: Ambulatory Visit | Attending: Family Medicine | Admitting: Family Medicine

## 2020-07-27 DIAGNOSIS — R911 Solitary pulmonary nodule: Secondary | ICD-10-CM | POA: Diagnosis not present

## 2020-07-27 DIAGNOSIS — J984 Other disorders of lung: Secondary | ICD-10-CM | POA: Diagnosis not present

## 2020-07-27 DIAGNOSIS — I700 Atherosclerosis of aorta: Secondary | ICD-10-CM | POA: Diagnosis not present

## 2020-07-27 DIAGNOSIS — R634 Abnormal weight loss: Secondary | ICD-10-CM | POA: Diagnosis not present

## 2020-07-27 DIAGNOSIS — I2510 Atherosclerotic heart disease of native coronary artery without angina pectoris: Secondary | ICD-10-CM | POA: Diagnosis not present

## 2020-07-30 ENCOUNTER — Other Ambulatory Visit: Payer: Self-pay

## 2020-07-30 ENCOUNTER — Encounter: Payer: Self-pay | Admitting: Family Medicine

## 2020-07-30 ENCOUNTER — Ambulatory Visit (INDEPENDENT_AMBULATORY_CARE_PROVIDER_SITE_OTHER): Payer: Medicare Other | Admitting: Family Medicine

## 2020-07-30 VITALS — BP 102/63 | HR 76 | Temp 98.0°F | Ht 63.0 in | Wt 106.4 lb

## 2020-07-30 DIAGNOSIS — R7302 Impaired glucose tolerance (oral): Secondary | ICD-10-CM | POA: Diagnosis not present

## 2020-07-30 DIAGNOSIS — M797 Fibromyalgia: Secondary | ICD-10-CM | POA: Diagnosis not present

## 2020-07-30 DIAGNOSIS — F5102 Adjustment insomnia: Secondary | ICD-10-CM

## 2020-07-30 DIAGNOSIS — I2584 Coronary atherosclerosis due to calcified coronary lesion: Secondary | ICD-10-CM | POA: Diagnosis not present

## 2020-07-30 DIAGNOSIS — I2510 Atherosclerotic heart disease of native coronary artery without angina pectoris: Secondary | ICD-10-CM | POA: Insufficient documentation

## 2020-07-30 MED ORDER — BLOOD GLUCOSE MONITOR KIT: PACK | 0 refills | Status: DC

## 2020-07-30 MED ORDER — DIAZEPAM 5 MG PO TABS: 5.0000 mg | ORAL_TABLET | Freq: Every evening | ORAL | 5 refills | Status: DC | PRN

## 2020-07-30 NOTE — Assessment & Plan Note (Signed)
Not controlled.  May benefit from amitriptyline though will complete cardiac work-up first.  We will continue Valium 5 mg nightly as needed.

## 2020-07-30 NOTE — Assessment & Plan Note (Signed)
May benefit from trial of amitriptyline at night.  Will complete cardiac work-up as above first.

## 2020-07-30 NOTE — Patient Instructions (Signed)
It was very nice to see you today!  Your CT scan showed benign pulmonary nodules but did show some calcification in your heart.  I will place a referral for the cardiologist to take a closer look.  We may need to have you see a GI doctor in the near future depending on the results of your cardiac testing.  Please check your blood sugar periodically.  I will send in a prescription for a glucometer.  I will see you back in 4 to 6 weeks.  Please come back to me sooner if needed.  Take care, Dr Jimmey Ralph  Please try these tips to maintain a healthy lifestyle:   Eat at least 3 REAL meals and 1-2 snacks per day.  Aim for no more than 5 hours between eating.  If you eat breakfast, please do so within one hour of getting up.    Each meal should contain half fruits/vegetables, one quarter protein, and one quarter carbs (no bigger than a computer mouse)   Cut down on sweet beverages. This includes juice, soda, and sweet tea.     Drink at least 1 glass of water with each meal and aim for at least 8 glasses per day   Exercise at least 150 minutes every week.

## 2020-07-30 NOTE — Progress Notes (Signed)
   Patricia Underwood is a 69 y.o. female who presents today for an office visit.  Assessment/Plan:  New/Acute Problems: Fatigue / Night Sweats Work-up thus far negative.  She has gained about 10 pounds back over the last couple of months.  Will pursue cardiac work-up at this point given her coronary artery disease incidentally found on her chest CT.  If continues to have fatigue/night sweat issues will need GI work-up  Chronic Problems Addressed Today: Coronary artery disease due to calcified coronary lesion Could potentially explain some of her fatigue.  Will place referral to cardiology for further evaluation.  IGT (impaired glucose tolerance) Continue management per endocrinology.  Will place order for glucometer.  Advised her to check blood sugar during her diaphoretic episodes.  Insomnia Not controlled.  May benefit from amitriptyline though will complete cardiac work-up first.  We will continue Valium 5 mg nightly as needed.  Fibromyalgia May benefit from trial of amitriptyline at night.  Will complete cardiac work-up as above first.    Subjective:  HPI:  Patient here for follow-up.  Last seen about 2 months ago with concern for unintentional weight loss and several other symptoms including insomnia, fatigue, and myalgias.  Labs at that time including CBC, CMET, TSH, CRP, sed rate, and UA were normal.  We checked high-resolution CT scan which resulted last week.  This showed stable pulmonary nodules no signs of lung disease but did show coronary artery disease.  Since our last visit symptoms have been about stable.  She has had a couple episodes of drenching sweats and have soaked through her bed sheets.  She has been trying to make a more conscious effort to increase her caloric intake and has gained about 5 pounds over the last couple of months.       Objective:  Physical Exam: BP 102/63   Pulse 76   Temp 98 F (36.7 C) (Temporal)   Ht 5\' 3"  (1.6 m)   Wt 106 lb 6.4 oz (48.3 kg)    SpO2 97%   BMI 18.85 kg/m   Wt Readings from Last 3 Encounters:  07/30/20 106 lb 6.4 oz (48.3 kg)  06/04/20 101 lb (45.8 kg)  05/10/20 95 lb 9.6 oz (43.4 kg)  Gen: No acute distress, resting comfortably Neuro: Grossly normal, moves all extremities Psych: Normal affect and thought content       M. 07/11/20, MD 07/30/2020 11:18 AM

## 2020-07-30 NOTE — Assessment & Plan Note (Signed)
Could potentially explain some of her fatigue.  Will place referral to cardiology for further evaluation.

## 2020-07-30 NOTE — Telephone Encounter (Signed)
FYI

## 2020-07-30 NOTE — Progress Notes (Signed)
Cardiology Consult Note    Date:  07/31/2020   ID:  Patricia Underwood, DOB 1951-06-06, MRN [id number]  PCP:  Vivi Barrack, MD  Cardiologist:  Fransico Him, MD   Chief Complaint  Patient presents with  . New Patient (Initial Visit)    coronary artery calcifications, HLD    History of Present Illness:  Patricia Underwood is a 69 y.o. female who is being seen today for the evaluation of CAD at the request of Vivi Barrack, MD.  This his a 69yo female with a history of HLD who recently had a Chest CTA for lung CA screening due to tobacco abuse and weight loss who was noted to have coronary artery calcifications and aortic atherosclerosis on Chest CT.  She also has had a few episodes of drenching night sweats that are being worked up by PCP.  She has chronic DOE related to smoking that is stable.  She has had some episodes of pain in her chest in the past that were nonexertional and felt related to her esophagus. She denies any PND, orthopnea, LE edema,  palpitations or syncope. She has had some problems with lightheadedness when walking her dog long distances.  She also has problems with dizziness when she is sitting and describes it has a feeling that she might fall.  She is compliant with her meds and is tolerating meds with no SE.    Past Medical History:  Diagnosis Date  . Aortic atherosclerosis ([place])   . B12 deficiency, Rx B12 injections 07/05/2017  . Chronic back pain   . Chronic right shoulder pain 05/30/2017  . Coronary artery calcification seen on CAT scan   . Esophageal spasm   . Fibromyalgia   . HLD (hyperlipidemia) 05/30/2017    Past Surgical History:  Procedure Laterality Date  . ABDOMINAL HYSTERECTOMY    . KNEE SURGERY Right   . LUMBAR FUSION     L4-L5  . SHOULDER ARTHROSCOPY Right     Current Medications: Current Meds  Medication Sig  . albuterol (VENTOLIN HFA) 108 (90 Base) MCG/ACT inhaler INHALE 2 PUFFS INTO THE LUNGS EVERY 6 HOURS AS NEEDED FOR WHEEZING OR FOR  SHORTNESS OF BREATH  . atorvastatin (LIPITOR) 20 MG tablet Take 1 tablet (20 mg total) by mouth at bedtime.  . blood glucose meter kit and supplies KIT Dispense based on patient and insurance preference. Use up to four times daily as directed. (FOR ICD-9 250.00, 250.01).  . cyanocobalamin (,VITAMIN B-12,) 1000 MCG/ML injection 1000 mcg (1 mg) injection once every other week  . diazepam (VALIUM) 5 MG tablet Take 1 tablet (5 mg total) by mouth at bedtime as needed for anxiety.  . pilocarpine (SALAGEN) 5 MG tablet Take 5 mg by mouth. 3 times a week as needed for saliva  . Syringe/Needle, Disp, (SYRINGE 3CC/25GX1") 25G X 1" 3 ML MISC 1 application by Does not apply route once a week.    Allergies:   Ambien [zolpidem tartrate], Flexeril [cyclobenzaprine], and Naproxen   Social History   Socioeconomic History  . Marital status: Married    Spouse name: Not on file  . Number of children: Not on file  . Years of education: Not on file  . Highest education level: Not on file  Occupational History  . Occupation: Retired   Tobacco Use  . Smoking status: Former Research scientist (life sciences)  . Smokeless tobacco: Never Used  . Tobacco comment: quit in [place] 2021  Substance and Sexual Activity  . Alcohol use:  Yes    Alcohol/week: 7.0 standard drinks    Types: 7 Glasses of wine per week  . Drug use: No  . Sexual activity: Not Currently    Partners: Male    Birth control/protection: None  Other Topics Concern  . Not on file  Social History Narrative  . Not on file   Social Determinants of Health   Financial Resource Strain:   . Difficulty of Paying Living Expenses: Not on file  Food Insecurity:   . Worried About Charity fundraiser in the Last Year: Not on file  . Ran Out of Food in the Last Year: Not on file  Transportation Needs:   . Lack of Transportation (Medical): Not on file  . Lack of Transportation (Non-Medical): Not on file  Physical Activity:   . Days of Exercise per Week: Not on file  . Minutes  of Exercise per Session: Not on file  Stress:   . Feeling of Stress : Not on file  Social Connections:   . Frequency of Communication with Friends and Family: Not on file  . Frequency of Social Gatherings with Friends and Family: Not on file  . Attends Religious Services: Not on file  . Active Member of Clubs or Organizations: Not on file  . Attends Archivist Meetings: Not on file  . Marital Status: Not on file     Family History:  The patient's family history includes CAD in her mother; CVA in her father; Hyperlipidemia in her sister.   ROS:   Please see the history of present illness.    ROS All other systems reviewed and are negative.  No flowsheet data found.     PHYSICAL EXAM:   VS:  BP (!) 108/56   Pulse 68   Ht 5' 3"  (1.6 m)   Wt 106 lb (48.1 kg)   SpO2 97%   BMI 18.78 kg/m    GEN: Well nourished, well developed, in no acute distress  HEENT: normal  Neck: no JVD, carotid bruits, or masses Cardiac: RRR; no murmurs, rubs, or gallops,no edema.  Intact distal pulses bilaterally.  Respiratory:  clear to auscultation bilaterally, normal work of breathing GI: soft, nontender, nondistended, + BS MS: no deformity or atrophy  Skin: warm and dry, no rash Neuro:  Alert and Oriented x 3, Strength and sensation are intact Psych: euthymic mood, full affect  Wt Readings from Last 3 Encounters:  07/31/20 106 lb (48.1 kg)  07/30/20 106 lb 6.4 oz (48.3 kg)  06/04/20 101 lb (45.8 kg)      Studies/Labs Reviewed:   EKG:  EKG is not ordered today.    Recent Labs: 06/04/2020: ALT 18; BUN 17; Creat 0.67; Hemoglobin 12.8; Platelets 295; Potassium 4.6; Sodium 140; TSH 1.27   Lipid Panel    Component Value Date/Time   CHOL 231 (H) 03/20/2020 0919   CHOL 272 (H) 07/28/2018 1625   TRIG 113.0 03/20/2020 0919   HDL 83.00 03/20/2020 0919   HDL 79 07/28/2018 1625   CHOLHDL 3 03/20/2020 0919   VLDL 22.6 03/20/2020 0919   LDLCALC 125 (H) 03/20/2020 0919   LDLCALC 158  (H) 07/28/2018 1625    Additional studies/ records that were reviewed today include:  Chest CT    ASSESSMENT:    1. Coronary artery calcification seen on CAT scan   2. Aortic atherosclerosis ([facility])   3. Pure hypercholesterolemia   4. Esophageal spasm      PLAN:  In order of problems  listed above:  1.  Coronary artery calcifications -she is asymptomatic with no CP -her CRFs include remote tobacco use and HLD -I will get a coronary Ca score to assess future risk -Lexiscan myoview to rule out silent ischemia -check 2D echo to make sure her LVF is normal  2.  Aortic atherosclerosis -LDL goal < 70 -BP is controlled -continue statin  3.  HLD -LDL goal < 70 due to aortic atheroscleorsis -LDL was 125 in May -repeat FLP  -continue Atorvastatin 88m daily    Medication Adjustments/Labs and Tests Ordered: Current medicines are reviewed at length with the patient today.  Concerns regarding medicines are outlined above.  Medication changes, Labs and Tests ordered today are listed in the Patient Instructions below.  There are no Patient Instructions on file for this visit.   Signed, TFransico Him MD  07/31/2020 10:32 AM    [facility] [address] [place]  [id number]: [phone] Fax: [phone]

## 2020-07-30 NOTE — Assessment & Plan Note (Signed)
Continue management per endocrinology.  Will place order for glucometer.  Advised her to check blood sugar during her diaphoretic episodes.

## 2020-07-31 ENCOUNTER — Ambulatory Visit (INDEPENDENT_AMBULATORY_CARE_PROVIDER_SITE_OTHER): Payer: Medicare Other | Admitting: Cardiology

## 2020-07-31 ENCOUNTER — Encounter: Payer: Self-pay | Admitting: Cardiology

## 2020-07-31 VITALS — BP 108/56 | HR 68 | Ht 63.0 in | Wt 106.0 lb

## 2020-07-31 DIAGNOSIS — I2510 Atherosclerotic heart disease of native coronary artery without angina pectoris: Secondary | ICD-10-CM | POA: Diagnosis not present

## 2020-07-31 DIAGNOSIS — I700 Atherosclerosis of aorta: Secondary | ICD-10-CM | POA: Diagnosis not present

## 2020-07-31 DIAGNOSIS — E7800 Pure hypercholesterolemia, unspecified: Secondary | ICD-10-CM

## 2020-07-31 DIAGNOSIS — K224 Dyskinesia of esophagus: Secondary | ICD-10-CM

## 2020-07-31 NOTE — Addendum Note (Signed)
Addended by: Theresia Majors on: 07/31/2020 10:42 AM   Modules accepted: Orders

## 2020-07-31 NOTE — Patient Instructions (Signed)
Medication Instructions:  Your physician recommends that you continue on your current medications as directed. Please refer to the Current Medication list given to you today.  *If you need a refill on your cardiac medications before your next appointment, please call your pharmacy*  Lab Work: Fasting lipids and ALT in 6 weeks If you have labs (blood work) drawn today and your tests are completely normal, you will receive your results only by:  MyChart Message (if you have MyChart) OR  A paper copy in the mail If you have any lab test that is abnormal or we need to change your treatment, we will call you to review the results.   Testing/Procedures: Your physician has requested that you have an echocardiogram. Echocardiography is a painless test that uses sound waves to create images of your heart. It provides your doctor with information about the size and shape of your heart and how well your hearts chambers and valves are working. This procedure takes approximately one hour. There are no restrictions for this procedure.  Your physician has requested that you have a lexiscan myoview. For further information please visit [email or web address] Please follow instruction sheet, as given.  Your physician has requested that you have a coronary calcium score CT scan.   Follow-Up: At [facility], you and your health needs are our priority.  As part of our continuing mission to provide you with exceptional heart care, we have created designated Provider Care Teams.  These Care Teams include your primary Cardiologist (physician) and Advanced Practice Providers (APPs -  Physician Assistants and Nurse Practitioners) who all work together to provide you with the care you need, when you need it.  Follow up with Dr. Mayford Knife as needed based on results of testing.

## 2020-08-02 ENCOUNTER — Telehealth (HOSPITAL_COMMUNITY): Payer: Self-pay | Admitting: *Deleted

## 2020-08-02 NOTE — Telephone Encounter (Signed)
Patient given detailed instructions per Myocardial Perfusion Study Information Sheet for the test on 08/06/20 at 7:45. Patient notified to arrive 15 minutes early and that it is imperative to arrive on time for appointment to keep from having the test rescheduled.  If you need to cancel or reschedule your appointment, please call the office within 24 hours of your appointment. . Patient verbalized understanding.Patricia Underwood

## 2020-08-06 ENCOUNTER — Ambulatory Visit (HOSPITAL_COMMUNITY): Payer: Medicare Other | Attending: Cardiovascular Disease

## 2020-08-06 ENCOUNTER — Other Ambulatory Visit: Payer: Self-pay

## 2020-08-06 DIAGNOSIS — E7800 Pure hypercholesterolemia, unspecified: Secondary | ICD-10-CM

## 2020-08-06 DIAGNOSIS — I700 Atherosclerosis of aorta: Secondary | ICD-10-CM | POA: Insufficient documentation

## 2020-08-06 DIAGNOSIS — I2510 Atherosclerotic heart disease of native coronary artery without angina pectoris: Secondary | ICD-10-CM | POA: Diagnosis not present

## 2020-08-06 LAB — MYOCARDIAL PERFUSION IMAGING
LV dias vol: 58 mL (ref 46–106)
LV sys vol: 20 mL
Peak HR: 83 {beats}/min
Rest HR: 49 {beats}/min
SDS: 0
SRS: 4
SSS: 4
TID: 1.08

## 2020-08-06 MED ORDER — TECHNETIUM TC 99M TETROFOSMIN IV KIT
10.1000 | PACK | Freq: Once | INTRAVENOUS | Status: AC | PRN
  Administered 2020-08-06: 10.1 via INTRAVENOUS
  Filled 2020-08-06: qty 11

## 2020-08-06 MED ORDER — REGADENOSON 0.4 MG/5ML IV SOLN
0.4000 mg | Freq: Once | INTRAVENOUS | Status: AC
  Administered 2020-08-06: 0.4 mg via INTRAVENOUS

## 2020-08-06 MED ORDER — TECHNETIUM TC 99M TETROFOSMIN IV KIT
31.3000 | PACK | Freq: Once | INTRAVENOUS | Status: AC | PRN
  Administered 2020-08-06: 31.3 via INTRAVENOUS
  Filled 2020-08-06: qty 32

## 2020-08-07 ENCOUNTER — Ambulatory Visit: Payer: Medicare Other | Attending: Cardiology

## 2020-08-07 ENCOUNTER — Ambulatory Visit: Payer: Medicare Other

## 2020-08-07 DIAGNOSIS — Z23 Encounter for immunization: Secondary | ICD-10-CM

## 2020-08-07 DIAGNOSIS — I700 Atherosclerosis of aorta: Secondary | ICD-10-CM

## 2020-08-07 DIAGNOSIS — E7800 Pure hypercholesterolemia, unspecified: Secondary | ICD-10-CM | POA: Insufficient documentation

## 2020-08-07 DIAGNOSIS — I2510 Atherosclerotic heart disease of native coronary artery without angina pectoris: Secondary | ICD-10-CM | POA: Insufficient documentation

## 2020-08-07 LAB — ECHOCARDIOGRAM COMPLETE
Area-P 1/2: 2.48 cm2
S' Lateral: 2.6 cm

## 2020-08-07 NOTE — Progress Notes (Signed)
   Covid-19 Vaccination [facility]  Name:  Kaisley Stiverson    MRN: [id number] DOB: 1951/09/18  08/07/2020  Ms. Smyser was observed post Covid-19 immunization for 15 minutes without incident. She was provided with Vaccine Information Sheet and instruction to access the V-Safe system.   Ms. Francesconi was instructed to call 911 with any severe reactions post vaccine: [name] Difficulty breathing  . Swelling of face and throat  . A fast heartbeat  . A bad rash all over body  . Dizziness and weakness

## 2020-08-08 ENCOUNTER — Ambulatory Visit (INDEPENDENT_AMBULATORY_CARE_PROVIDER_SITE_OTHER)
Admission: RE | Admit: 2020-08-08 | Discharge: 2020-08-08 | Disposition: A | Discharge status: Home / Self Care | Payer: Medicare Other | Source: Ambulatory Visit | Attending: Cardiology | Admitting: Cardiology

## 2020-08-08 ENCOUNTER — Other Ambulatory Visit: Payer: Self-pay

## 2020-08-08 DIAGNOSIS — I2510 Atherosclerotic heart disease of native coronary artery without angina pectoris: Secondary | ICD-10-CM

## 2020-08-08 DIAGNOSIS — I700 Atherosclerosis of aorta: Secondary | ICD-10-CM

## 2020-08-08 DIAGNOSIS — E7800 Pure hypercholesterolemia, unspecified: Secondary | ICD-10-CM

## 2020-08-09 ENCOUNTER — Telehealth: Payer: Self-pay

## 2020-08-09 MED ORDER — ASPIRIN EC 81 MG PO TBEC: 81.0000 mg | DELAYED_RELEASE_TABLET | Freq: Every day | ORAL | 3 refills | Status: DC

## 2020-08-09 NOTE — Telephone Encounter (Signed)
Repeat FLP and ALT in 6 weeks - do not miss any doses of statin

## 2020-08-09 NOTE — Telephone Encounter (Signed)
Left message for patient that she will need to come by the office 11/2 anytime between 730am and 430pm for fasting lab work. Advised to call back with any questions.

## 2020-08-09 NOTE — Telephone Encounter (Signed)
The patient has been notified of the result and verbalized understanding.  All questions (if any) were answered. Theresia Majors, RN 08/09/2020 11:32 AM  Patient will start on ASA 81 mg daily.  She states that she had not been taking her atorvastatin when she had lab work done in May. She restarted atorvastatin 20 mg daily after her appointment with Dr. Mayford Knife on 09/21.

## 2020-08-09 NOTE — Telephone Encounter (Signed)
-----   Message from Quintella Reichert, MD sent at 08/08/2020  4:32 PM EDT ----- Patient has evidence of coronary artery calcium in her LAD and LCx.  This is consistent with CAD. Start ASA 81mg  daily.  LDL goal is < 70 and it was 125.  Please find out if she has had her lipitor increased since her labs in May.  She needs aggresssive risk factor modification.  Needs to follow a low fat low carb diet with lots of fruits and veggies. Nuclear stress test showed no ischemia

## 2020-08-10 ENCOUNTER — Encounter: Payer: Self-pay | Admitting: Endocrinology

## 2020-08-10 ENCOUNTER — Other Ambulatory Visit: Payer: Self-pay

## 2020-08-10 ENCOUNTER — Ambulatory Visit (INDEPENDENT_AMBULATORY_CARE_PROVIDER_SITE_OTHER): Payer: Medicare Other | Admitting: Endocrinology

## 2020-08-10 VITALS — BP 110/78 | HR 75 | Ht 63.0 in | Wt 100.0 lb

## 2020-08-10 DIAGNOSIS — I2510 Atherosclerotic heart disease of native coronary artery without angina pectoris: Secondary | ICD-10-CM | POA: Diagnosis not present

## 2020-08-10 DIAGNOSIS — R7302 Impaired glucose tolerance (oral): Secondary | ICD-10-CM | POA: Diagnosis not present

## 2020-08-10 LAB — POCT GLYCOSYLATED HEMOGLOBIN (HGB A1C): Hemoglobin A1C: 5.5 % (ref 4.0–5.6)

## 2020-08-10 NOTE — Patient Instructions (Addendum)
Please continue your good exercise regimen. If you get the sweating again, check the blood sugar, and call if it is below 60.  If so, I can prescribe a pill for it.   Please come back for a follow-up appointment in 6 months.

## 2020-08-10 NOTE — Progress Notes (Signed)
Subjective:    Patient ID: Patricia Underwood, female    DOB: 12-09-1950, 68 y.o.   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  She has intermitt diaphoresis (total of 2 episodes in the past week).  She has not yet checked cbg during an episode.   Past Medical History:  Diagnosis Date  . Aortic atherosclerosis ([place])   . B12 deficiency, Rx B12 injections 07/05/2017  . Chronic back pain   . Chronic right shoulder pain 05/30/2017  . Coronary artery calcification seen on CAT scan   . Esophageal spasm   . Fibromyalgia   . HLD (hyperlipidemia) 05/30/2017    Past Surgical History:  Procedure Laterality Date  . ABDOMINAL HYSTERECTOMY    . KNEE SURGERY Right   . LUMBAR FUSION     L4-L5  . SHOULDER ARTHROSCOPY Right     Social History   Socioeconomic History  . Marital status: Married    Spouse name: Not on file  . Number of children: Not on file  . Years of education: Not on file  . Highest education level: Not on file  Occupational History  . Occupation: Retired   Tobacco Use  . Smoking status: Former Research scientist (life sciences)  . Smokeless tobacco: Never Used  . Tobacco comment: quit in [place] 2021  Substance and Sexual Activity  . Alcohol use: Yes    Alcohol/week: 7.0 standard drinks    Types: 7 Glasses of wine per week  . Drug use: No  . Sexual activity: Not Currently    Partners: Male    Birth control/protection: None  Other Topics Concern  . Not on file  Social History Narrative  . Not on file   Social Determinants of Health   Financial Resource Strain:   . Difficulty of Paying Living Expenses: Not on file  Food Insecurity:   . Worried About Charity fundraiser in the Last Year: Not on file  . Ran Out of Food in the Last Year: Not on file  Transportation Needs:   . Lack of Transportation  (Medical): Not on file  . Lack of Transportation (Non-Medical): Not on file  Physical Activity:   . Days of Exercise per Week: Not on file  . Minutes of Exercise per Session: Not on file  Stress:   . Feeling of Stress : Not on file  Social Connections:   . Frequency of Communication with Friends and Family: Not on file  . Frequency of Social Gatherings with Friends and Family: Not on file  . Attends Religious Services: Not on file  . Active Member of Clubs or Organizations: Not on file  . Attends Archivist Meetings: Not on file  . Marital Status: Not on file  Intimate Partner Violence:   . Fear of Current or Ex-Partner: Not on file  . Emotionally Abused: Not on file  . Physically Abused: Not on file  . Sexually Abused: Not on file    Current Outpatient Medications on File Prior to Visit  Medication Sig Dispense Refill  . albuterol (VENTOLIN HFA) 108 (90 Base) MCG/ACT inhaler INHALE 2 PUFFS INTO THE LUNGS EVERY 6 HOURS AS NEEDED FOR WHEEZING OR FOR SHORTNESS OF BREATH 18 g 2  . aspirin EC 81 MG tablet Take 1 tablet (81 mg total) by mouth daily. Swallow whole. 90 tablet  3  . atorvastatin (LIPITOR) 20 MG tablet Take 1 tablet (20 mg total) by mouth at bedtime. 90 tablet 1  . blood glucose meter kit and supplies KIT Dispense based on patient and insurance preference. Use up to four times daily as directed. (FOR ICD-9 250.00, 250.01). 1 each 0  . cyanocobalamin (,VITAMIN B-12,) 1000 MCG/ML injection 1000 mcg (1 mg) injection once every other week 6 mL 3  . diazepam (VALIUM) 5 MG tablet Take 1 tablet (5 mg total) by mouth at bedtime as needed for anxiety. 30 tablet 5  . pilocarpine (SALAGEN) 5 MG tablet Take 5 mg by mouth. 3 times a week as needed for saliva    . Syringe/Needle, Disp, (SYRINGE 3CC/25GX1") 25G X 1" 3 ML MISC 1 application by Does not apply route once a week. 12 each 3   No current facility-administered medications on file prior to visit.    Allergies  Allergen  Reactions  . Ambien [Zolpidem Tartrate]   . Flexeril [Cyclobenzaprine] Other (See Comments)    Patient unsure of reaction  . Naproxen Other (See Comments)    Patient unsure of reaction.    Family History  Problem Relation Age of Onset  . CAD Mother        died of massive MI at 61  . CVA Father        x 2  . Hyperlipidemia Sister   . Diabetes Neg Hx     BP 110/78   Pulse 75   Ht _0  (1.6 m)   Wt 100 lb (45.4 kg)   SpO2 96%   BMI 17.71 kg/m     Review of Systems     Objective:   Physical Exam VITAL SIGNS:  See vs page GENERAL: no distress EXT: no leg edema.   Lab Results  Component Value Date   HGBA1C 5.5 08/10/2020       Assessment & Plan:  Hypoglycemia: stable Diaphoresis, new, uncertain etiology and prognosis   Patient Instructions  Please continue your good exercise regimen. If you get the sweating again, check the blood sugar, and call if it is below 60.  If so, I can prescribe a pill for it.   Please come back for a follow-up appointment in 6 months.

## 2020-08-23 DIAGNOSIS — M130 Polyarthritis, unspecified: Secondary | ICD-10-CM | POA: Diagnosis not present

## 2020-08-23 DIAGNOSIS — F5104 Psychophysiologic insomnia: Secondary | ICD-10-CM | POA: Diagnosis not present

## 2020-08-23 DIAGNOSIS — Z79899 Other long term (current) drug therapy: Secondary | ICD-10-CM | POA: Diagnosis not present

## 2020-08-23 DIAGNOSIS — M797 Fibromyalgia: Secondary | ICD-10-CM | POA: Diagnosis not present

## 2020-08-28 DIAGNOSIS — F4322 Adjustment disorder with anxiety: Secondary | ICD-10-CM | POA: Diagnosis not present

## 2020-08-29 DIAGNOSIS — F4322 Adjustment disorder with anxiety: Secondary | ICD-10-CM | POA: Diagnosis not present

## 2020-09-02 DIAGNOSIS — Z23 Encounter for immunization: Secondary | ICD-10-CM | POA: Diagnosis not present

## 2020-09-05 DIAGNOSIS — F4322 Adjustment disorder with anxiety: Secondary | ICD-10-CM | POA: Diagnosis not present

## 2020-09-05 DIAGNOSIS — M2550 Pain in unspecified joint: Secondary | ICD-10-CM | POA: Diagnosis not present

## 2020-09-05 DIAGNOSIS — M5136 Other intervertebral disc degeneration, lumbar region: Secondary | ICD-10-CM | POA: Diagnosis not present

## 2020-09-05 DIAGNOSIS — M150 Primary generalized (osteo)arthritis: Secondary | ICD-10-CM | POA: Diagnosis not present

## 2020-09-05 DIAGNOSIS — R682 Dry mouth, unspecified: Secondary | ICD-10-CM | POA: Diagnosis not present

## 2020-09-05 DIAGNOSIS — Z681 Body mass index (BMI) 19 or less, adult: Secondary | ICD-10-CM | POA: Diagnosis not present

## 2020-09-05 DIAGNOSIS — M797 Fibromyalgia: Secondary | ICD-10-CM | POA: Diagnosis not present

## 2020-09-07 ENCOUNTER — Other Ambulatory Visit: Payer: Self-pay

## 2020-09-07 ENCOUNTER — Encounter: Payer: Self-pay | Admitting: Family Medicine

## 2020-09-07 ENCOUNTER — Ambulatory Visit (INDEPENDENT_AMBULATORY_CARE_PROVIDER_SITE_OTHER): Payer: Medicare Other | Admitting: Family Medicine

## 2020-09-07 VITALS — BP 138/64 | HR 73 | Temp 98.3°F | Ht 63.0 in | Wt 104.0 lb

## 2020-09-07 DIAGNOSIS — F5102 Adjustment insomnia: Secondary | ICD-10-CM | POA: Diagnosis not present

## 2020-09-07 DIAGNOSIS — F4323 Adjustment disorder with mixed anxiety and depressed mood: Secondary | ICD-10-CM

## 2020-09-07 DIAGNOSIS — M797 Fibromyalgia: Secondary | ICD-10-CM

## 2020-09-07 MED ORDER — DOXEPIN HCL 10 MG PO CAPS: 10.0000 mg | ORAL_CAPSULE | Freq: Every day | ORAL | 5 refills | Status: DC

## 2020-09-07 NOTE — Assessment & Plan Note (Signed)
Worsened. Hopefully will have some improvement with treatment of her insomnia.

## 2020-09-07 NOTE — Assessment & Plan Note (Signed)
Not controlled.  Has had Ambien in the past which she had significant side effects to.  Does not want to restart.  Discussed options.  We will start doxepin as this was requested by her pharmacist.  Discussed potential side effects.  She will check in in a couple weeks via MyChart.  We can adjust dose as needed.  May consider trial of Remeron, gabapentin, Lyrica in the future depending on response to doxepin.

## 2020-09-07 NOTE — Patient Instructions (Signed)
It was very nice to see you today!  We will start doxepin.  Please check in with me in a couple weeks and let me know how is working for you.  Take care, Dr Jimmey Ralph  Please try these tips to maintain a healthy lifestyle:   Eat at least 3 REAL meals and 1-2 snacks per day.  Aim for no more than 5 hours between eating.  If you eat breakfast, please do so within one hour of getting up.    Each meal should contain half fruits/vegetables, one quarter protein, and one quarter carbs (no bigger than a computer mouse)   Cut down on sweet beverages. This includes juice, soda, and sweet tea.     Drink at least 1 glass of water with each meal and aim for at least 8 glasses per day   Exercise at least 150 minutes every week.

## 2020-09-07 NOTE — Progress Notes (Signed)
   Patricia Underwood is a 69 y.o. female who presents today for an office visit.  Assessment/Plan:  Chronic Problems Addressed Today: Situational mixed anxiety and depressive disorder Worsened. Hopefully will have some improvement with treatment of her insomnia.   Fibromyalgia Overall stable.  Will start doxepin as below.  Hopefully this will help.  May consider trial of gabapentin or Lyrica in the future.  Insomnia Not controlled.  Has had Ambien in the past which she had significant side effects to.  Does not want to restart.  Discussed options.  We will start doxepin as this was requested by her pharmacist.  Discussed potential side effects.  She will check in in a couple weeks via MyChart.  We can adjust dose as needed.  May consider trial of Remeron, gabapentin, Lyrica in the future depending on response to doxepin.     Subjective:  HPI:  See A/p.         Objective:  Physical Exam: BP 138/64   Pulse 73   Temp 98.3 F (36.8 C) (Temporal)   Ht 5\' 3"  (1.6 m)   Wt 104 lb (47.2 kg)   SpO2 97%   BMI 18.42 kg/m   Wt Readings from Last 3 Encounters:  09/07/20 104 lb (47.2 kg)  08/10/20 100 lb (45.4 kg)  08/06/20 106 lb (48.1 kg)  Gen: No acute distress, resting comfortably Neuro: Grossly normal, moves all extremities Psych: Normal affect and thought content       M. 08/08/20, MD 09/07/2020 3:05 PM

## 2020-09-07 NOTE — Assessment & Plan Note (Signed)
Overall stable.  Will start doxepin as below.  Hopefully this will help.  May consider trial of gabapentin or Lyrica in the future.

## 2020-09-11 ENCOUNTER — Other Ambulatory Visit: Payer: Medicare Other

## 2020-09-12 DIAGNOSIS — F4322 Adjustment disorder with anxiety: Secondary | ICD-10-CM | POA: Diagnosis not present

## 2020-09-13 ENCOUNTER — Other Ambulatory Visit: Payer: Medicare Other

## 2020-09-13 ENCOUNTER — Other Ambulatory Visit: Payer: Self-pay

## 2020-09-13 DIAGNOSIS — I2510 Atherosclerotic heart disease of native coronary artery without angina pectoris: Secondary | ICD-10-CM | POA: Diagnosis not present

## 2020-09-13 DIAGNOSIS — I700 Atherosclerosis of aorta: Secondary | ICD-10-CM | POA: Diagnosis not present

## 2020-09-13 DIAGNOSIS — E7800 Pure hypercholesterolemia, unspecified: Secondary | ICD-10-CM | POA: Diagnosis not present

## 2020-09-13 LAB — LIPID PANEL
Chol/HDL Ratio: 1.6 ratio (ref 0.0–4.4)
Cholesterol, Total: 191 mg/dL (ref 100–199)
HDL: 120 mg/dL (ref >39)
LDL Chol Calc (NIH): 60 mg/dL (ref 0–99)
Triglycerides: 55 mg/dL (ref 0–149)
VLDL Cholesterol Cal: 11 mg/dL (ref 5–40)

## 2020-09-13 LAB — ALT: ALT: 20 IU/L (ref 0–32)

## 2020-10-01 ENCOUNTER — Encounter: Payer: Self-pay | Admitting: Family Medicine

## 2020-10-01 MED ORDER — TRIAZOLAM 0.25 MG PO TABS: 0.2500 mg | ORAL_TABLET | Freq: Every evening | ORAL | 0 refills | Status: DC | PRN

## 2020-10-12 ENCOUNTER — Other Ambulatory Visit: Payer: Self-pay | Admitting: *Deleted

## 2020-10-12 MED ORDER — GLUCOSE BLOOD VI STRP: ORAL_STRIP | 12 refills | Status: DC

## 2020-10-18 ENCOUNTER — Other Ambulatory Visit: Payer: Self-pay | Admitting: *Deleted

## 2020-10-18 MED ORDER — GLUCOSE BLOOD VI STRP: ORAL_STRIP | 12 refills | Status: DC

## 2020-10-22 ENCOUNTER — Encounter: Payer: Self-pay | Admitting: Family Medicine

## 2020-10-26 NOTE — Telephone Encounter (Signed)
Patient states when we sent in the prescription to the pharmacy for her stripes and patient would like [place] to call pharmacy to get correct code in order for insurance to cover RX and send a new RX for her to fill.  After we talk to pharmacy and send it over patient is requesting a call to let her know its been taken care of

## 2020-10-29 ENCOUNTER — Telehealth: Payer: Self-pay

## 2020-10-29 MED ORDER — TRIAZOLAM 0.25 MG PO TABS: 0.2500 mg | ORAL_TABLET | Freq: Every evening | ORAL | 5 refills | Status: DC | PRN

## 2020-10-29 NOTE — Telephone Encounter (Signed)
  LAST APPOINTMENT DATE: 09/07/2020   NEXT APPOINTMENT DATE:@Visit  date not found  MEDICATION: triazolam (HALCION) 0.25 MG tablet  PHARMACY:[facility] [address] [place], [place] - [address] [place]

## 2020-10-29 NOTE — Telephone Encounter (Signed)
Patient is calling in about this issue, and is really desperate for this.

## 2020-10-29 NOTE — Telephone Encounter (Signed)
Spoke with pharmacy. Pharmacy stated CPT code not correct  Used E11.1 correct code for Dx

## 2020-10-29 NOTE — Telephone Encounter (Signed)
LAST APPOINTMENT DATE: 09/07/2020   NEXT APPOINTMENT DATE: Visit date not found   Rx Halcion  LAST REFILL: 10/01/2020  QTY: 30 REF: 0

## 2020-10-31 ENCOUNTER — Other Ambulatory Visit: Payer: Self-pay | Admitting: *Deleted

## 2020-10-31 MED ORDER — GLUCOSE BLOOD VI STRP: ORAL_STRIP | 12 refills | Status: DC

## 2020-10-31 NOTE — Telephone Encounter (Signed)
Rx send

## 2020-10-31 NOTE — Telephone Encounter (Signed)
Prescription resend 

## 2020-11-12 DIAGNOSIS — F5104 Psychophysiologic insomnia: Secondary | ICD-10-CM | POA: Diagnosis not present

## 2020-11-12 DIAGNOSIS — M130 Polyarthritis, unspecified: Secondary | ICD-10-CM | POA: Diagnosis not present

## 2020-11-12 DIAGNOSIS — Z79899 Other long term (current) drug therapy: Secondary | ICD-10-CM | POA: Diagnosis not present

## 2020-11-12 DIAGNOSIS — M797 Fibromyalgia: Secondary | ICD-10-CM | POA: Diagnosis not present

## 2020-12-11 DIAGNOSIS — M130 Polyarthritis, unspecified: Secondary | ICD-10-CM | POA: Diagnosis not present

## 2020-12-11 DIAGNOSIS — Z79899 Other long term (current) drug therapy: Secondary | ICD-10-CM | POA: Diagnosis not present

## 2020-12-11 DIAGNOSIS — F5104 Psychophysiologic insomnia: Secondary | ICD-10-CM | POA: Diagnosis not present

## 2020-12-11 DIAGNOSIS — M797 Fibromyalgia: Secondary | ICD-10-CM | POA: Diagnosis not present

## 2021-01-08 DIAGNOSIS — M130 Polyarthritis, unspecified: Secondary | ICD-10-CM | POA: Diagnosis not present

## 2021-01-08 DIAGNOSIS — Z79899 Other long term (current) drug therapy: Secondary | ICD-10-CM | POA: Diagnosis not present

## 2021-01-08 DIAGNOSIS — M797 Fibromyalgia: Secondary | ICD-10-CM | POA: Diagnosis not present

## 2021-01-08 DIAGNOSIS — F5104 Psychophysiologic insomnia: Secondary | ICD-10-CM | POA: Diagnosis not present

## 2021-01-22 DIAGNOSIS — M545 Low back pain, unspecified: Secondary | ICD-10-CM | POA: Diagnosis not present

## 2021-01-22 DIAGNOSIS — G8929 Other chronic pain: Secondary | ICD-10-CM | POA: Diagnosis not present

## 2021-01-29 IMAGING — CT CT CHEST HIGH RESOLUTION W/O CM
2 of 5 series · 15 of 36 positions shown, 18 images · non-contrast
Comparison: 07/22/2018

CLINICAL DATA: Lung nodules, fatigue, weight loss

EXAM:
CT CHEST WITHOUT CONTRAST
TECHNIQUE: Multidetector CT imaging of the chest was performed following the
standard protocol without intravenous contrast. High resolution
imaging of the lungs, as well as inspiratory and expiratory imaging,
was performed.

[Series 2: thorax · axial · 0.58mm/px · z∈[-434,-136]mm · 12 of 165 slices shown, 15 images]
[im 8/165  mediastinal]
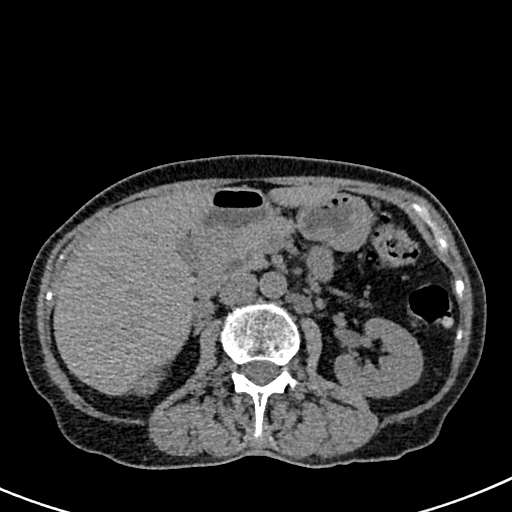
[im 8/165  lung]
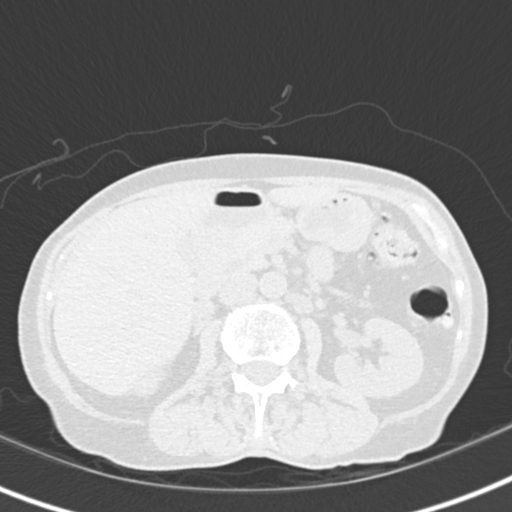
[im 22/165  lung]
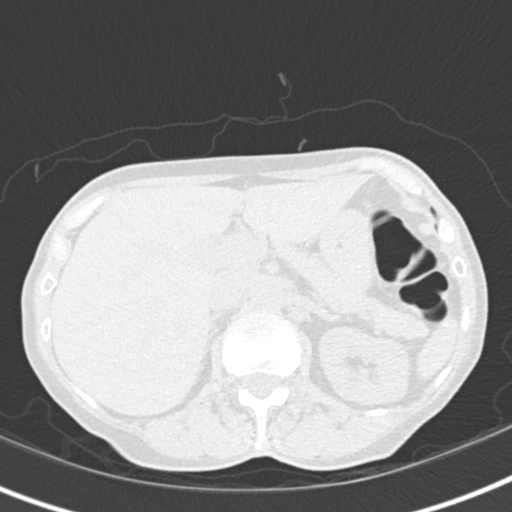
[im 36/165  lung]
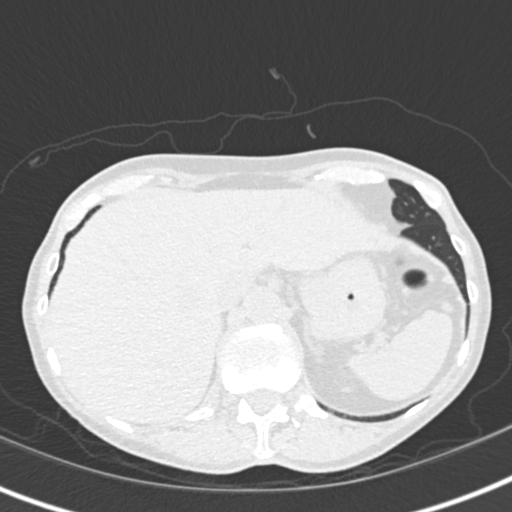
[im 50/165  lung]
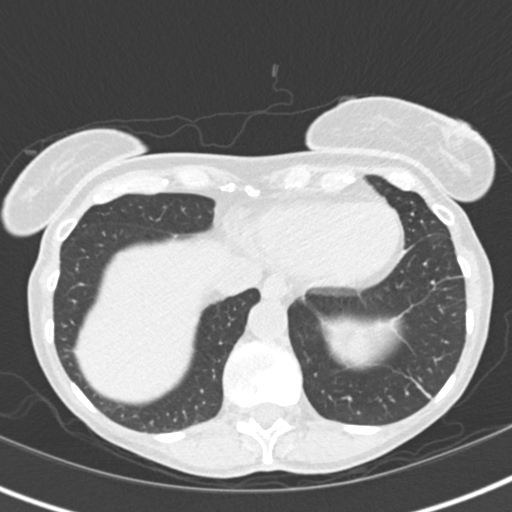
[im 65/165  mediastinal]
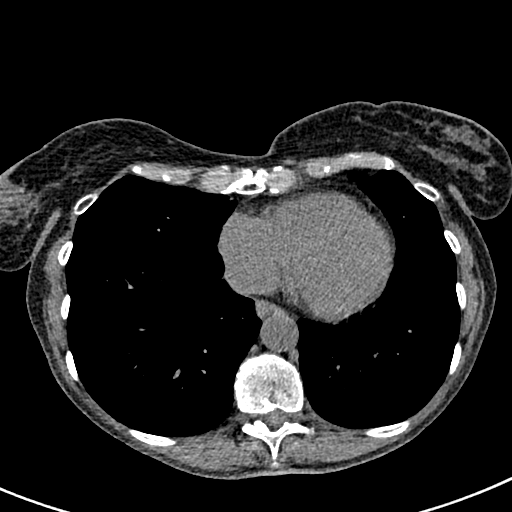
[im 65/165  lung]
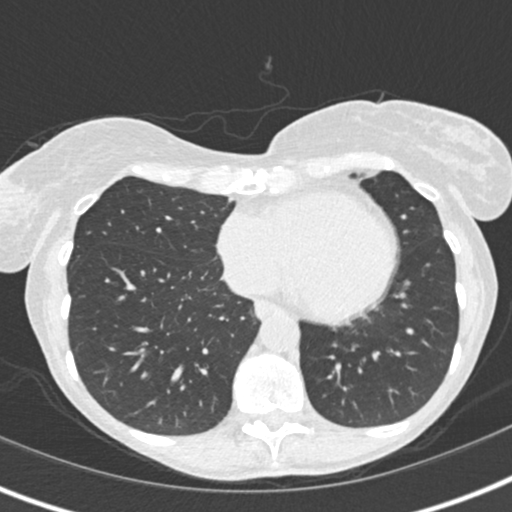
[im 79/165  lung]
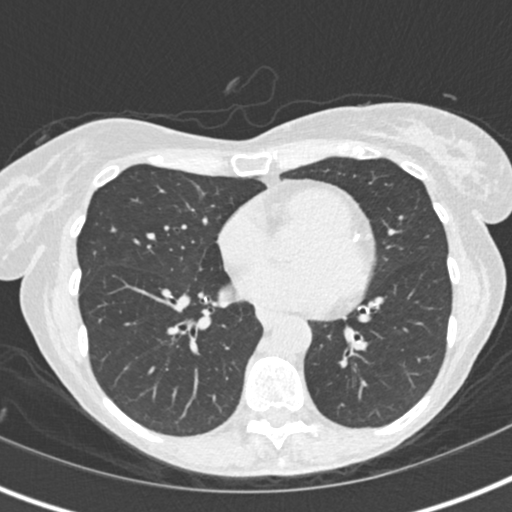
[im 86/165  lung]
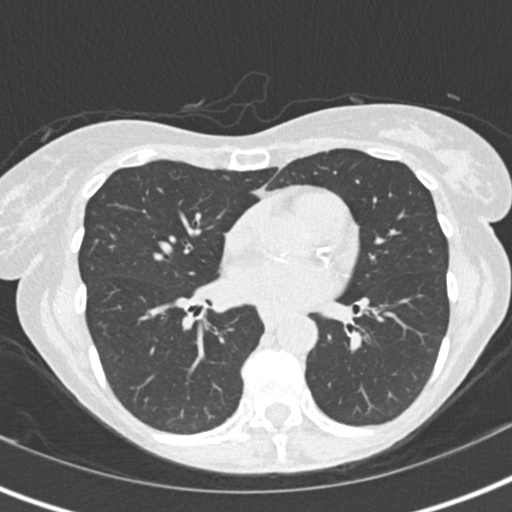
[im 100/165  lung]
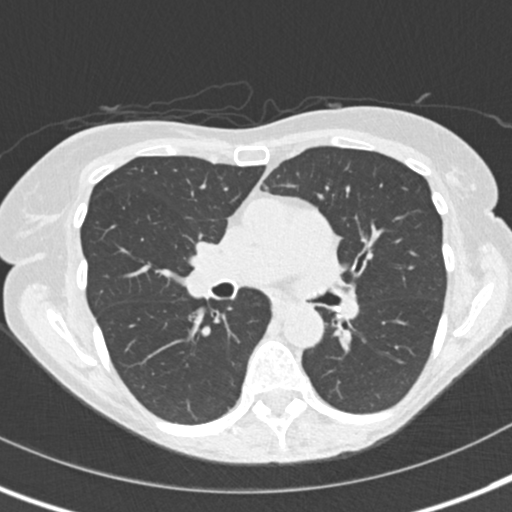
[im 115/165  mediastinal]
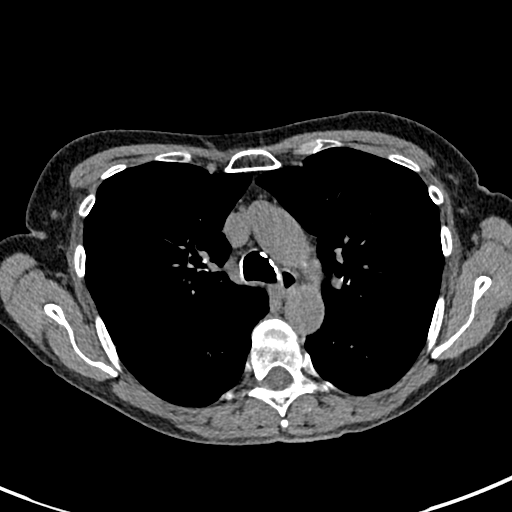
[im 115/165  lung]
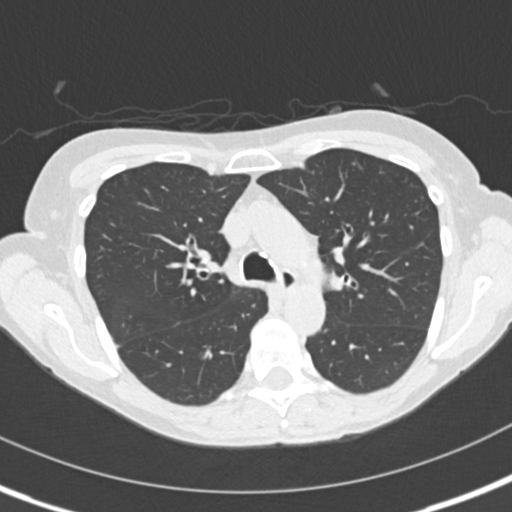
[im 129/165  lung]
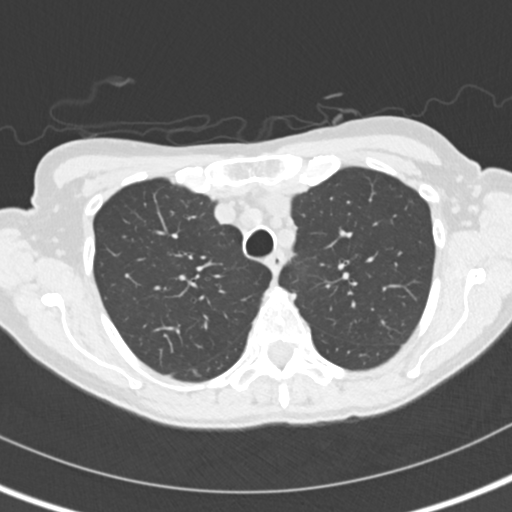
[im 143/165  lung]
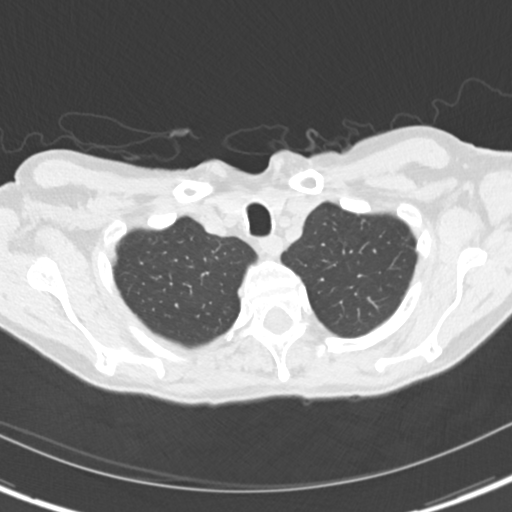
[im 157/165  lung]
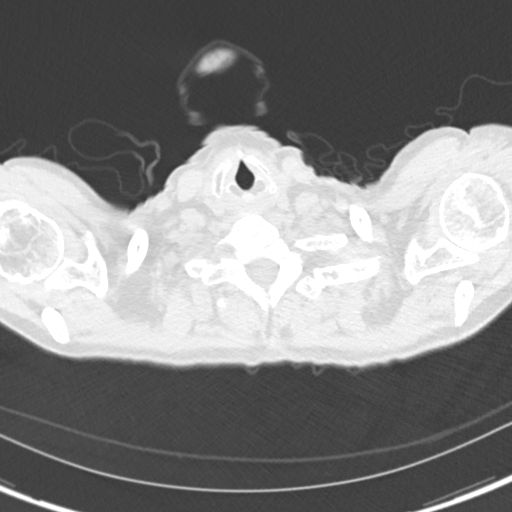

[Series 7: coronal · coronal · 0.57mm/px · 3 of 72 slices shown]
[im 15/72  lung]
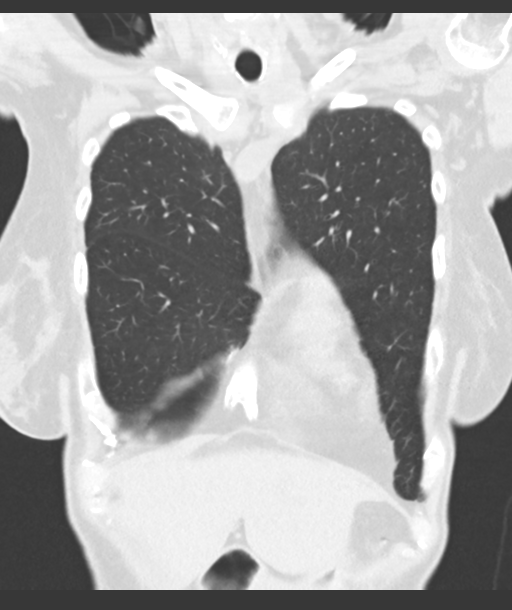
[im 29/72  lung]
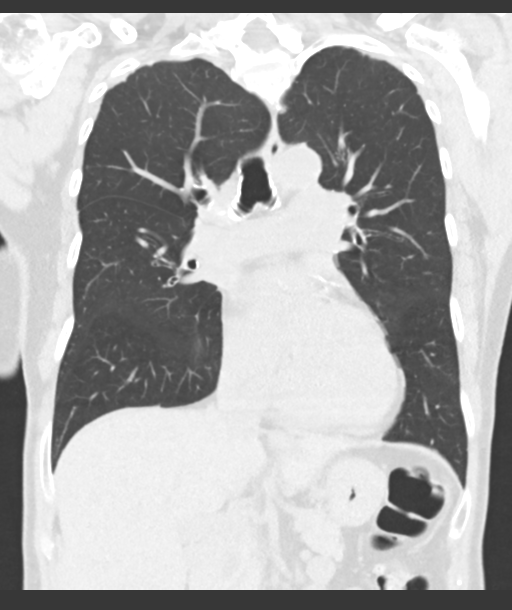
[im 43/72  lung]
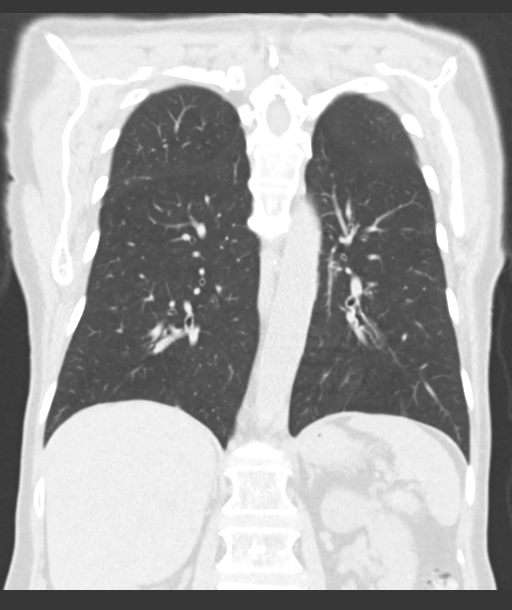

[15 of 36 positions shown; findings below may reference images not displayed]

FINDINGS: Cardiovascular: Aortic atherosclerosis. Normal heart size. Left
coronary artery calcifications. No pericardial effusion.

Mediastinum/Nodes: No enlarged mediastinal, hilar, or axillary lymph
nodes. Thyroid gland, trachea, and esophagus demonstrate no
significant findings.

Lungs/Pleura: Bandlike scarring of the left lung base. No
significant air trapping on expiratory phase imaging. Occasional
tiny pulmonary nodules are stable and benign, for example a 2 mm
nodule of the superior segment right lower lobe (series 5, image 37)
and a 2 mm nodule of the right upper lobe (series 5, image 50). No
pleural effusion or pneumothorax.

Upper Abdomen: No acute abnormality.

Musculoskeletal: No chest wall mass or suspicious bone lesions
identified.
IMPRESSION: 1. Occasional tiny pulmonary nodules are stable and definitively
benign. No further CT follow-up is required for these nodules.
2. No evidence of fibrotic interstitial lung disease. No significant
air trapping on expiratory phase imaging.
3. Coronary artery disease.  Aortic Atherosclerosis ([id number]).

## 2021-02-08 ENCOUNTER — Ambulatory Visit: Payer: Medicare Other | Admitting: Endocrinology

## 2021-02-14 ENCOUNTER — Other Ambulatory Visit: Payer: Self-pay

## 2021-02-14 ENCOUNTER — Encounter: Payer: Self-pay | Admitting: Family Medicine

## 2021-02-14 ENCOUNTER — Ambulatory Visit (INDEPENDENT_AMBULATORY_CARE_PROVIDER_SITE_OTHER): Payer: Medicare Other | Admitting: Family Medicine

## 2021-02-14 VITALS — BP 131/80 | HR 101 | Temp 98.6°F | Ht 63.0 in | Wt 109.6 lb

## 2021-02-14 DIAGNOSIS — M1990 Unspecified osteoarthritis, unspecified site: Secondary | ICD-10-CM

## 2021-02-14 DIAGNOSIS — M25569 Pain in unspecified knee: Secondary | ICD-10-CM | POA: Insufficient documentation

## 2021-02-14 DIAGNOSIS — E538 Deficiency of other specified B group vitamins: Secondary | ICD-10-CM

## 2021-02-14 DIAGNOSIS — M1711 Unilateral primary osteoarthritis, right knee: Secondary | ICD-10-CM | POA: Diagnosis not present

## 2021-02-14 DIAGNOSIS — F419 Anxiety disorder, unspecified: Secondary | ICD-10-CM | POA: Insufficient documentation

## 2021-02-14 DIAGNOSIS — E7800 Pure hypercholesterolemia, unspecified: Secondary | ICD-10-CM | POA: Diagnosis not present

## 2021-02-14 DIAGNOSIS — E559 Vitamin D deficiency, unspecified: Secondary | ICD-10-CM | POA: Diagnosis not present

## 2021-02-14 DIAGNOSIS — R739 Hyperglycemia, unspecified: Secondary | ICD-10-CM | POA: Diagnosis not present

## 2021-02-14 MED ORDER — DIAZEPAM 5 MG PO TABS: 5.0000 mg | ORAL_TABLET | Freq: Two times a day (BID) | ORAL | 1 refills | Status: DC | PRN

## 2021-02-14 MED ORDER — METHYLPREDNISOLONE ACETATE 80 MG/ML IJ SUSP
80.0000 mg | Freq: Once | INTRAMUSCULAR | Status: AC
  Administered 2021-02-14: 80 mg via INTRA_ARTICULAR

## 2021-02-14 NOTE — Patient Instructions (Addendum)
It was very nice to see you today!  We injected your knee today.  You may feel some stiffness over the next day or so due to the fluid that we injected but you should have some improvement in your pain.  Please let us know if you have any sort of redness or severe swelling.  I will refill your diazepam today.  Please come back soon for your blood work. Take care, Dr Jimmey Ralph  PLEASE NOTE:  If you had any lab tests please let us know if you have not heard back within a few days. You may see your results on mychart before we have a chance to review them but we will give you a call once they are reviewed by [place]. If we ordered any referrals today, please let us know if you have not heard from their office within the next week.   Please try these tips to maintain a healthy lifestyle:   Eat at least 3 REAL meals and 1-2 snacks per day.  Aim for no more than 5 hours between eating.  If you eat breakfast, please do so within one hour of getting up.    Each meal should contain half fruits/vegetables, one quarter protein, and one quarter carbs (no bigger than a computer mouse)   Cut down on sweet beverages. This includes juice, soda, and sweet tea.     Drink at least 1 glass of water with each meal and aim for at least 8 glasses per day   Exercise at least 150 minutes every week.

## 2021-02-14 NOTE — Assessment & Plan Note (Signed)
Acute flare.  Reassuring exam today.  Steroid injection performed today.  See below procedure note.  She tolerated well.

## 2021-02-14 NOTE — Progress Notes (Signed)
   Patricia Underwood is a 70 y.o. female who presents today for an office visit.  Assessment/Plan:  Chronic Problems Addressed Today: Anxiety Database no red flags.  Refill diazepam today.  She is tolerating well with minimal side effects.  Osteoarthritis Acute flare.  Reassuring exam today.  Steroid injection performed today.  See below procedure note.  She tolerated well.  B12 deficiency, Rx B12 injections Check B12 next blood draw.  Vitamin D deficiency Check vitamin D next blood draw.  HLD (hyperlipidemia) She will come back soon for labs.  She is on Lipitor 20 mg daily.     Subjective:  HPI:  Patient here with right knee pain.  Has a history of osteoarthritis and meniscal tear in her right knee.  She thinks she flared up yesterday while walking her dog.  She felt a sudden jolt of pain in her right knee and has had pain with walking since then.  She thinks that she may have stepped on a log which caused the pain.  She has been able to bear weight however it is painful and walking.  No buckling or other mechanical symptoms.         Objective:  Physical Exam: BP 131/80   Pulse (!) 101   Temp 98.6 F (37 C) (Temporal)   Ht 5\' 3"  (1.6 m)   Wt 109 lb 9.6 oz (49.7 kg)   SpO2 97%   BMI 19.41 kg/m   Gen: No acute distress, resting comfortably CV: Regular rate and rhythm with no murmurs appreciated Pulm: Normal work of breathing, clear to auscultation bilaterally with no crackles, wheezes, or rhonchi MSK: Right knee without deformities.  Full range of motion.  Mildly tender to palpation along the joint line.  Stable to varus and valgus stress.  Mild crepitus with active and passive range of motion.  Neurovascular intact distally.  No effusions noted. Neuro: Grossly normal, moves all extremities Psych: Normal affect and thought content   Knee Injection Procedure Note  Indication: Symptom relief of right Knee Pain.  Procedure Details  Verbal consent was obtained for the  procedure. The joint was prepped with Betadine. Topical ethyl chloride was applied for anesthesia. A 22 gauge needle was inserted into the medial aspect of the joint.  3 ml 1% lidocaine and 1 ml of 80mg /cc Depo-Medrol was then injected into the joint. The needle was removed and the area cleansed and dressed.  Complications:  None; patient tolerated the procedure well. '      M. , MD 02/14/2021 12:28 PM

## 2021-02-14 NOTE — Assessment & Plan Note (Signed)
Check B12 next blood draw.

## 2021-02-14 NOTE — Assessment & Plan Note (Signed)
She will come back soon for labs.  She is on Lipitor 20 mg daily.

## 2021-02-14 NOTE — Assessment & Plan Note (Signed)
Database no red flags.  Refill diazepam today.  She is tolerating well with minimal side effects.

## 2021-02-14 NOTE — Assessment & Plan Note (Signed)
Check vitamin D next blood draw. 

## 2021-02-18 ENCOUNTER — Telehealth: Payer: Self-pay

## 2021-02-18 ENCOUNTER — Other Ambulatory Visit: Payer: Self-pay

## 2021-02-18 ENCOUNTER — Encounter: Payer: Self-pay | Admitting: Family Medicine

## 2021-02-18 ENCOUNTER — Ambulatory Visit (INDEPENDENT_AMBULATORY_CARE_PROVIDER_SITE_OTHER): Payer: Medicare Other | Admitting: Family Medicine

## 2021-02-18 ENCOUNTER — Ambulatory Visit (INDEPENDENT_AMBULATORY_CARE_PROVIDER_SITE_OTHER): Payer: Medicare Other

## 2021-02-18 VITALS — BP 145/81 | HR 94 | Temp 98.3°F | Ht 63.0 in | Wt 110.6 lb

## 2021-02-18 DIAGNOSIS — E538 Deficiency of other specified B group vitamins: Secondary | ICD-10-CM | POA: Diagnosis not present

## 2021-02-18 DIAGNOSIS — M1990 Unspecified osteoarthritis, unspecified site: Secondary | ICD-10-CM

## 2021-02-18 DIAGNOSIS — M25569 Pain in unspecified knee: Secondary | ICD-10-CM | POA: Diagnosis not present

## 2021-02-18 DIAGNOSIS — M25561 Pain in right knee: Secondary | ICD-10-CM | POA: Diagnosis not present

## 2021-02-18 DIAGNOSIS — E559 Vitamin D deficiency, unspecified: Secondary | ICD-10-CM | POA: Diagnosis not present

## 2021-02-18 DIAGNOSIS — R739 Hyperglycemia, unspecified: Secondary | ICD-10-CM

## 2021-02-18 DIAGNOSIS — E7800 Pure hypercholesterolemia, unspecified: Secondary | ICD-10-CM | POA: Diagnosis not present

## 2021-02-18 LAB — HEMOGLOBIN A1C: Hgb A1c MFr Bld: 5.9 % (ref 4.6–6.5)

## 2021-02-18 LAB — CBC
HCT: 41.9 % (ref 36.0–46.0)
Hemoglobin: 13.7 g/dL (ref 12.0–15.0)
MCHC: 32.6 g/dL (ref 30.0–36.0)
MCV: 92.1 fl (ref 78.0–100.0)
Platelets: 339 10*3/uL (ref 150.0–400.0)
RBC: 4.55 Mil/uL (ref 3.87–5.11)
RDW: 13.4 % (ref 11.5–15.5)
WBC: 13.7 10*3/uL — ABNORMAL HIGH (ref 4.0–10.5)

## 2021-02-18 LAB — COMPREHENSIVE METABOLIC PANEL
ALT: 20 U/L (ref 0–35)
AST: 14 U/L (ref 0–37)
Albumin: 4.5 g/dL (ref 3.5–5.2)
Alkaline Phosphatase: 66 U/L (ref 39–117)
BUN: 17 mg/dL (ref 6–23)
CO2: 30 mEq/L (ref 19–32)
Calcium: 10 mg/dL (ref 8.4–10.5)
Chloride: 101 mEq/L (ref 96–112)
Creatinine, Ser: 0.62 mg/dL (ref 0.40–1.20)
GFR: 90.41 mL/min (ref >60.00)
Glucose, Bld: 103 mg/dL — ABNORMAL HIGH (ref 70–99)
Potassium: 4.5 mEq/L (ref 3.5–5.1)
Sodium: 139 mEq/L (ref 135–145)
Total Bilirubin: 0.3 mg/dL (ref 0.2–1.2)
Total Protein: 6.9 g/dL (ref 6.0–8.3)

## 2021-02-18 LAB — VITAMIN B12: Vitamin B-12: 300 pg/mL (ref 211–911)

## 2021-02-18 LAB — VITAMIN D 25 HYDROXY (VIT D DEFICIENCY, FRACTURES): VITD: 23.55 ng/mL — ABNORMAL LOW (ref 30.00–100.00)

## 2021-02-18 LAB — TSH: TSH: 1.02 u[IU]/mL (ref 0.35–4.50)

## 2021-02-18 MED ORDER — DICLOFENAC SODIUM 75 MG PO TBEC: 75.0000 mg | DELAYED_RELEASE_TABLET | Freq: Two times a day (BID) | ORAL | 0 refills | Status: DC

## 2021-02-18 NOTE — Assessment & Plan Note (Signed)
Check Vit D 

## 2021-02-18 NOTE — Assessment & Plan Note (Signed)
Check B12 

## 2021-02-18 NOTE — Assessment & Plan Note (Signed)
Check labs 

## 2021-02-18 NOTE — Progress Notes (Signed)
   Patricia Underwood is a 70 y.o. female who presents today for an office visit.  Assessment/Plan:  Chronic Problems Addressed Today: Knee pain No obvious abnormalities on her x-ray based on my read.  We will await radiology read.  Patient asked about repeat ablation numbing injection" today.  Advised patient that I did not think they would be of much benefit at this point considering she did not have a favorable response to corticosteroid injection 4 days ago.  Will start diclofenac.  Will likely need referral to sports medicine orthopedics depending on results.  She does have a history of meniscal tear that could be contributing though would have expected this to improve with steroid injection last week.  B12 deficiency, Rx B12 injections Check B12.   Vitamin D deficiency Check Vit D.  HLD (hyperlipidemia) Check labs.      Subjective:  HPI:  Patient here for follow-up knee pain.  She was seen for this 4 days ago.  Intra-articular injection performed.  Symptoms have not improved.  She has not had any obvious injuries or precipitating events.  No specific treatment tried.  She says that she has not been using ice as much but has been staying with compression of the area.       Objective:  Physical Exam: BP (!) 145/81   Pulse 94   Temp 98.3 F (36.8 C) (Temporal)   Ht 5\' 3"  (1.6 m)   Wt 110 lb 9.6 oz (50.2 kg)   SpO2 97%   BMI 19.59 kg/m   Gen: No acute distress, resting comfortably MSK: Right knee without abnormalities.  Tender to palpation along medial and lateral joint lines.  Neurovascular intact distally. Neuro: Grossly normal, moves all extremities Psych: Normal affect and thought content       M. , MD 02/18/2021 2:13 PM

## 2021-02-18 NOTE — Assessment & Plan Note (Signed)
No obvious abnormalities on her x-ray based on my read.  We will await radiology read.  Patient asked about repeat ablation numbing injection" today.  Advised patient that I did not think they would be of much benefit at this point considering she did not have a favorable response to corticosteroid injection 4 days ago.  Will start diclofenac.  Will likely need referral to sports medicine orthopedics depending on results.  She does have a history of meniscal tear that could be contributing though would have expected this to improve with steroid injection last week.

## 2021-02-18 NOTE — Patient Instructions (Addendum)
It was very nice to see you today!  I do not see any abnormalities son your xray. The radiologist will look at this xray as well.  I am not sure if another injection will help at this point.  Please try the diclofenac and let me know if not improving in the new few days.   Take care, Dr Jimmey Ralph  PLEASE NOTE:  If you had any lab tests please let us know if you have not heard back within a few days. You may see your results on mychart before we have a chance to review them but we will give you a call once they are reviewed by [place]. If we ordered any referrals today, please let us know if you have not heard from their office within the next week.   Please try these tips to maintain a healthy lifestyle:   Eat at least 3 REAL meals and 1-2 snacks per day.  Aim for no more than 5 hours between eating.  If you eat breakfast, please do so within one hour of getting up.    Each meal should contain half fruits/vegetables, one quarter protein, and one quarter carbs (no bigger than a computer mouse)   Cut down on sweet beverages. This includes juice, soda, and sweet tea.     Drink at least 1 glass of water with each meal and aim for at least 8 glasses per day   Exercise at least 150 minutes every week.

## 2021-02-18 NOTE — Telephone Encounter (Signed)
error 

## 2021-02-20 DIAGNOSIS — Z79899 Other long term (current) drug therapy: Secondary | ICD-10-CM | POA: Diagnosis not present

## 2021-02-20 DIAGNOSIS — M545 Low back pain, unspecified: Secondary | ICD-10-CM | POA: Diagnosis not present

## 2021-02-20 DIAGNOSIS — G8929 Other chronic pain: Secondary | ICD-10-CM | POA: Diagnosis not present

## 2021-02-20 NOTE — Progress Notes (Signed)
Please inform patient of the following:  Right knee x-rays normal.  Would like for her to let us know if knee pain is not improving and we can get her in to sports medicine.   Katina Degree. Jimmey Ralph, MD 02/20/2021 8:47 AM

## 2021-02-20 NOTE — Progress Notes (Signed)
Please inform patient of the following:  HEr vitamin D is low.  Recommend we start 50,000 international units weekly.  Please send this in for patient and we can recheck in 3 to 6 months.  Her white blood cell counts are elevated.  This is likely due to her recent cortisone shot.  I would like for her to come back in a few weeks to recheck.  Please place order for CBC.  Your blood sugar is borderline but stable.  Everything else is normal.

## 2021-02-21 ENCOUNTER — Other Ambulatory Visit: Payer: Self-pay

## 2021-02-21 ENCOUNTER — Other Ambulatory Visit: Payer: Self-pay | Admitting: *Deleted

## 2021-02-21 ENCOUNTER — Telehealth: Payer: Self-pay | Admitting: *Deleted

## 2021-02-21 DIAGNOSIS — E538 Deficiency of other specified B group vitamins: Secondary | ICD-10-CM

## 2021-02-21 DIAGNOSIS — M25569 Pain in unspecified knee: Secondary | ICD-10-CM

## 2021-02-21 DIAGNOSIS — E038 Other specified hypothyroidism: Secondary | ICD-10-CM

## 2021-02-21 DIAGNOSIS — M1990 Unspecified osteoarthritis, unspecified site: Secondary | ICD-10-CM

## 2021-02-21 MED ORDER — CHOLECALCIFEROL 1.25 MG (50000 UT) PO TABS: ORAL_TABLET | ORAL | 0 refills | Status: DC

## 2021-02-21 MED ORDER — "SYRINGE 25G X 1"" 3 ML MISC": 1.0000 "application " | 3 refills | Status: DC

## 2021-02-21 MED ORDER — PREDNISONE 20 MG PO TABS: 20.0000 mg | ORAL_TABLET | Freq: Every day | ORAL | 0 refills | Status: DC

## 2021-02-21 NOTE — Telephone Encounter (Signed)
Ok to check T3 and T4 though with a normal TSH this will not change our management. Ok to send in prednisone 20mg  x 5 days.  Recommend sports medicine referral if she is having ongoing issues with her knee.  . Katina Degree, MD 02/21/2021 1:00 PM

## 2021-02-21 NOTE — Telephone Encounter (Signed)
Unable to LMOVM, VM box full. I have ordered T3 and T4. I have placed sports med referral. I have sent in prednisone 20 mg

## 2021-02-21 NOTE — Telephone Encounter (Signed)
Patient requesting change on medication Rx Diclofenac per pharmacy request due to allergic reaction. Stated will like to be tested for T3 and T4 due to family history of thyroid problems  Advise to F/U with PCP for lab questions

## 2021-03-01 ENCOUNTER — Other Ambulatory Visit (INDEPENDENT_AMBULATORY_CARE_PROVIDER_SITE_OTHER): Payer: Medicare Other

## 2021-03-01 DIAGNOSIS — D72829 Elevated white blood cell count, unspecified: Secondary | ICD-10-CM

## 2021-03-01 DIAGNOSIS — E038 Other specified hypothyroidism: Secondary | ICD-10-CM

## 2021-03-01 LAB — CBC WITH DIFFERENTIAL/PLATELET
Basophils Absolute: 0 10*3/uL (ref 0.0–0.1)
Basophils Relative: 0.6 % (ref 0.0–3.0)
Eosinophils Absolute: 0.4 10*3/uL (ref 0.0–0.7)
Eosinophils Relative: 5.1 % — ABNORMAL HIGH (ref 0.0–5.0)
HCT: 41.4 % (ref 36.0–46.0)
Hemoglobin: 13.8 g/dL (ref 12.0–15.0)
Lymphocytes Relative: 43 % (ref 12.0–46.0)
Lymphs Abs: 3 10*3/uL (ref 0.7–4.0)
MCHC: 33.5 g/dL (ref 30.0–36.0)
MCV: 91.3 fl (ref 78.0–100.0)
Monocytes Absolute: 0.7 10*3/uL (ref 0.1–1.0)
Monocytes Relative: 9.7 % (ref 3.0–12.0)
Neutro Abs: 2.9 10*3/uL (ref 1.4–7.7)
Neutrophils Relative %: 41.6 % — ABNORMAL LOW (ref 43.0–77.0)
Platelets: 317 10*3/uL (ref 150.0–400.0)
RBC: 4.53 Mil/uL (ref 3.87–5.11)
RDW: 13.8 % (ref 11.5–15.5)
WBC: 7 10*3/uL (ref 4.0–10.5)

## 2021-03-01 NOTE — Progress Notes (Signed)
I, Patricia Underwood, LAT, ATC acting as a scribe for Patricia Graham, MD.  Subjective:    I'm seeing this patient as a consultation for:  Dr. Jacquiline Underwood. Note will be routed back to referring provider/PCP.  CC: Chronic right knee pain  HPI: Pt is a 70 y/o female c/o R knee pain x 2 weeks. MOI: Pt suffered a fall, landing on R knee. Pt has a PMHx of a meniscal tear. Pt received a R knee steroid injection by PCP on 02/14/21. Pt notes moderate relief from steroid injection. Pt locates pain to anterior aspect R knee.  She remains active.  She denies locking or catching or giving way.  R knee swelling: no Mechanical symptoms: no Aggravates: varies Treatments tried: ice, elevation, brace  Dx imaging: 02/18/21 R knee XR  11/17/17 R knee MRI  09/24/18 R knee XR  Past medical history, Surgical history, Family history, Social history, Allergies, and medications have been entered into the medical record, reviewed.   Review of Systems: No new headache, visual changes, nausea, vomiting, diarrhea, constipation, dizziness, abdominal pain, skin rash, fevers, chills, night sweats, weight loss, swollen lymph nodes, body aches, joint swelling, muscle aches, chest pain, shortness of breath, mood changes, visual or auditory hallucinations.   Objective:    Vitals:   03/04/21 1234  BP: 118/74  Pulse: 64  SpO2: 97%   General: Well Developed, well nourished, and in no acute distress.  Neuro/Psych: Alert and oriented x3, extra-ocular muscles intact, able to move all 4 extremities, sensation grossly intact. Skin: Warm and dry, no rashes noted.  Respiratory: Not using accessory muscles, speaking in full sentences, trachea midline.  Cardiovascular: Pulses palpable, no extremity edema. Abdomen: Does not appear distended. MSK: Right knee normal-appearing Normal motion with crepitation. Mildly tender palpation anterior knee. Strength 4/5 to extension otherwise normal. Stable ligamentous exam. Negative McMurray's  test.  Lab and Radiology Results  X-ray images right knee obtained on February 18, 2021 concerning for mild medial compartment DJD per my read.  Diagnostic Limited MSK Ultrasound of: Right knee Quad tendon intact normal-appearing Trace joint effusion. Patellar tendon normal-appearing Degenerative appearing lateral meniscus. Medial meniscus partial extruded slightly narrowed medial joint line. 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 female with anterior knee pain after fall.  No fractures visible on x-ray.  Patient has slight weakness to knee extension.  She did have some benefit from steroid injection.  Discussed options.  Plan to continue conservative management.  Taught home exercises program today with ATC focused on quad strength.  Also recommend Voltaren gel.  If not improving she will let me know and I will refer to PT which should be helpful.  May consider hyaluronic acid injection or even MRI in the future.[name]  PDMP not reviewed this encounter. Orders Placed This Encounter  Procedures  . [place] LIMITED JOINT SPACE STRUCTURES LOW RIGHT(NO LINKED CHARGES)    Standing Status:   Future    Number of Occurrences:   1    Standing Expiration Date:   09/03/2021    Order Specific Question:   Reason for Exam (SYMPTOM  OR DIAGNOSIS REQUIRED)    Answer:   right knee pain    Order Specific Question:   Preferred imaging location?    Answer:   [facility]   No orders of the defined types were placed in this encounter.   Discussed warning  signs or symptoms. Please see discharge instructions. Patient expresses understanding.   The above documentation has been reviewed and is accurate and complete [name], M.D.

## 2021-03-01 NOTE — Addendum Note (Signed)
Addended by: Tharon Aquas on: 03/01/2021 07:49 AM   Modules accepted: Orders

## 2021-03-04 ENCOUNTER — Other Ambulatory Visit: Payer: Self-pay

## 2021-03-04 ENCOUNTER — Ambulatory Visit (INDEPENDENT_AMBULATORY_CARE_PROVIDER_SITE_OTHER): Payer: Medicare Other | Admitting: Family Medicine

## 2021-03-04 ENCOUNTER — Ambulatory Visit: Payer: Self-pay

## 2021-03-04 VITALS — BP 119/68 | HR 71 | Temp 98.2°F | Ht 63.0 in | Wt 108.4 lb

## 2021-03-04 VITALS — BP 118/74 | HR 64 | Ht 63.0 in | Wt 108.8 lb

## 2021-03-04 DIAGNOSIS — M8580 Other specified disorders of bone density and structure, unspecified site: Secondary | ICD-10-CM

## 2021-03-04 DIAGNOSIS — M25569 Pain in unspecified knee: Secondary | ICD-10-CM | POA: Diagnosis not present

## 2021-03-04 DIAGNOSIS — R7302 Impaired glucose tolerance (oral): Secondary | ICD-10-CM | POA: Diagnosis not present

## 2021-03-04 DIAGNOSIS — R5383 Other fatigue: Secondary | ICD-10-CM | POA: Diagnosis not present

## 2021-03-04 DIAGNOSIS — E2839 Other primary ovarian failure: Secondary | ICD-10-CM

## 2021-03-04 DIAGNOSIS — M25561 Pain in right knee: Secondary | ICD-10-CM | POA: Diagnosis not present

## 2021-03-04 DIAGNOSIS — E559 Vitamin D deficiency, unspecified: Secondary | ICD-10-CM | POA: Diagnosis not present

## 2021-03-04 LAB — LIPID PANEL
Cholesterol: 238 mg/dL — ABNORMAL HIGH (ref <200)
HDL: 119 mg/dL (ref >=50)
LDL Cholesterol (Calc): 99 mg/dL (calc)
Non-HDL Cholesterol (Calc): 119 mg/dL (calc) (ref <130)
Total CHOL/HDL Ratio: 2 (calc) (ref <5.0)
Triglycerides: 102 mg/dL (ref <150)

## 2021-03-04 LAB — TEST AUTHORIZATION

## 2021-03-04 LAB — T4: T4, Total: 5 ug/dL — ABNORMAL LOW (ref 5.1–11.9)

## 2021-03-04 LAB — T3: T3, Total: 82 ng/dL (ref 76–181)

## 2021-03-04 NOTE — Assessment & Plan Note (Signed)
>>  ASSESSMENT AND PLAN FOR OSTEOPENIA WRITTEN ON 03/04/2021 12:13 PM BY Rodney Clamp, MD  Due for repeat DEXA.  We will place this order today.  She is on vitamin D  supplementation.  She is very concerned about why her vitamin D  levels have been persistently low.  Will place referral to endocrinology.

## 2021-03-04 NOTE — Patient Instructions (Signed)
Thank you for coming in today.  Please use voltaren gel up to 4x daily for pain as needed.   Please complete the exercises that the athletic trainer went over with you: View at [email or web address] using code: [email or web address]  Can refer to PT if not getting better.  Please schedule a follow up appointment with me in 1 month.

## 2021-03-04 NOTE — Progress Notes (Signed)
Please inform patient of the following:  Her cholesterol numbers are a bit better than last year but still have some room for improvement. We ideally should get her LDL to less than 70. Recommend increasing her lipitor to 80mg  daily. Please send in new Rx. We can recheck in 6-12 months.

## 2021-03-04 NOTE — Progress Notes (Signed)
   Patricia Underwood is a 70 y.o. female who presents today for an office visit.  Assessment/Plan:  Chronic Problems Addressed Today: IGT (impaired glucose tolerance) Had lengthy discussion with patient regarding her recent labs.  Her A1c 5.9 on last check.  She is managing her glucose via lifestyle modifications.  Will place referral to endocrinology per patient request.  Osteopenia Due for repeat DEXA.  We will place this order today.  She is on vitamin D supplementation.  She is very concerned about why her vitamin D levels have been persistently low.  Will place referral to endocrinology.  Vitamin D deficiency Last vitamin D level 23.  She is on 50,000 IUs weekly.  Will place referral to endocrinology.  Knee pain She will be following up with sports medicine later today.  Reassured patient that her recent elevated white blood cell count was transient and likely due to recent steroid injection.  Advised her this is nothing that needs to be followed up on.    Subjective:  HPI:  Patient here for follow-up.  She has several questions regarding her recent blood work.  She is concerned about low vitamin D.  She would like to know why she has persistently low in vitamin D.  She is also concerned about her blood sugar.  She has been following with endocrinology for this.  She has been trying to work on diet and exercise.  She is not currently on any antidiabetic medications.         Objective:  Physical Exam: BP 119/68   Pulse 71   Temp 98.2 F (36.8 C) (Temporal)   Ht 5\' 3"  (1.6 m)   Wt 108 lb 6.4 oz (49.2 kg)   SpO2 97%   BMI 19.20 kg/m   Gen: No acute distress, resting comfortably Neuro: Grossly normal, moves all extremities Psych: Normal affect and thought content  Time Spent: 45 minutes of total time was spent on the date of the encounter performing the following actions: chart review prior to seeing the patient including her recent labs, obtaining history, performing a  medically necessary exam, counseling on the treatment plan, placing orders, and documenting in our EHR.        . Patricia Degree, MD 03/04/2021 12:15 PM

## 2021-03-04 NOTE — Assessment & Plan Note (Signed)
Last vitamin D level 23.  She is on 50,000 IUs weekly.  Will place referral to endocrinology.

## 2021-03-04 NOTE — Assessment & Plan Note (Signed)
Due for repeat DEXA.  We will place this order today.  She is on vitamin D supplementation.  She is very concerned about why her vitamin D levels have been persistently low.  Will place referral to endocrinology.

## 2021-03-04 NOTE — Assessment & Plan Note (Signed)
Had lengthy discussion with patient regarding her recent labs.  Her A1c 5.9 on last check.  She is managing her glucose via lifestyle modifications.  Will place referral to endocrinology per patient request.

## 2021-03-04 NOTE — Patient Instructions (Signed)
It was very nice to see you today!   We will check blood work.  I will place a referral for you to see a different endocrinologist.  We will also get your bone density scan.  Take care, Dr Jimmey Ralph  PLEASE NOTE:  If you had any lab tests please let us know if you have not heard back within a few days. You may see your results on mychart before we have a chance to review them but we will give you a call once they are reviewed by [place]. If we ordered any referrals today, please let us know if you have not heard from their office within the next week.   Please try these tips to maintain a healthy lifestyle:   Eat at least 3 REAL meals and 1-2 snacks per day.  Aim for no more than 5 hours between eating.  If you eat breakfast, please do so within one hour of getting up.    Each meal should contain half fruits/vegetables, one quarter protein, and one quarter carbs (no bigger than a computer mouse)   Cut down on sweet beverages. This includes juice, soda, and sweet tea.     Drink at least 1 glass of water with each meal and aim for at least 8 glasses per day   Exercise at least 150 minutes every week.

## 2021-03-04 NOTE — Assessment & Plan Note (Signed)
She will be following up with sports medicine later today.

## 2021-03-18 DIAGNOSIS — H353130 Nonexudative age-related macular degeneration, bilateral, stage unspecified: Secondary | ICD-10-CM | POA: Diagnosis not present

## 2021-03-18 DIAGNOSIS — H52223 Regular astigmatism, bilateral: Secondary | ICD-10-CM | POA: Diagnosis not present

## 2021-03-18 DIAGNOSIS — H524 Presbyopia: Secondary | ICD-10-CM | POA: Diagnosis not present

## 2021-03-18 DIAGNOSIS — H40053 Ocular hypertension, bilateral: Secondary | ICD-10-CM | POA: Diagnosis not present

## 2021-03-18 DIAGNOSIS — H2513 Age-related nuclear cataract, bilateral: Secondary | ICD-10-CM | POA: Diagnosis not present

## 2021-03-19 DIAGNOSIS — R030 Elevated blood-pressure reading, without diagnosis of hypertension: Secondary | ICD-10-CM | POA: Diagnosis not present

## 2021-03-19 DIAGNOSIS — Z79899 Other long term (current) drug therapy: Secondary | ICD-10-CM | POA: Diagnosis not present

## 2021-03-19 DIAGNOSIS — Z681 Body mass index (BMI) 19 or less, adult: Secondary | ICD-10-CM | POA: Diagnosis not present

## 2021-03-19 DIAGNOSIS — G8929 Other chronic pain: Secondary | ICD-10-CM | POA: Diagnosis not present

## 2021-03-19 DIAGNOSIS — M545 Low back pain, unspecified: Secondary | ICD-10-CM | POA: Diagnosis not present

## 2021-03-21 DIAGNOSIS — Z681 Body mass index (BMI) 19 or less, adult: Secondary | ICD-10-CM | POA: Diagnosis not present

## 2021-03-21 DIAGNOSIS — M797 Fibromyalgia: Secondary | ICD-10-CM | POA: Diagnosis not present

## 2021-03-21 DIAGNOSIS — R682 Dry mouth, unspecified: Secondary | ICD-10-CM | POA: Diagnosis not present

## 2021-03-21 DIAGNOSIS — M5136 Other intervertebral disc degeneration, lumbar region: Secondary | ICD-10-CM | POA: Diagnosis not present

## 2021-03-21 DIAGNOSIS — M150 Primary generalized (osteo)arthritis: Secondary | ICD-10-CM | POA: Diagnosis not present

## 2021-03-21 DIAGNOSIS — M2550 Pain in unspecified joint: Secondary | ICD-10-CM | POA: Diagnosis not present

## 2021-03-26 ENCOUNTER — Ambulatory Visit: Payer: Medicare Other | Admitting: Family Medicine

## 2021-03-28 ENCOUNTER — Encounter: Payer: Self-pay | Admitting: Family Medicine

## 2021-03-28 ENCOUNTER — Ambulatory Visit (INDEPENDENT_AMBULATORY_CARE_PROVIDER_SITE_OTHER): Payer: Medicare Other | Admitting: Family Medicine

## 2021-03-28 ENCOUNTER — Other Ambulatory Visit: Payer: Self-pay

## 2021-03-28 VITALS — BP 116/69 | HR 78 | Temp 98.0°F | Ht 63.0 in | Wt 106.8 lb

## 2021-03-28 DIAGNOSIS — M25569 Pain in unspecified knee: Secondary | ICD-10-CM | POA: Diagnosis not present

## 2021-03-28 DIAGNOSIS — M797 Fibromyalgia: Secondary | ICD-10-CM | POA: Diagnosis not present

## 2021-03-28 DIAGNOSIS — G43809 Other migraine, not intractable, without status migrainosus: Secondary | ICD-10-CM

## 2021-03-28 DIAGNOSIS — F419 Anxiety disorder, unspecified: Secondary | ICD-10-CM | POA: Diagnosis not present

## 2021-03-28 DIAGNOSIS — M069 Rheumatoid arthritis, unspecified: Secondary | ICD-10-CM | POA: Diagnosis not present

## 2021-03-28 DIAGNOSIS — E7800 Pure hypercholesterolemia, unspecified: Secondary | ICD-10-CM

## 2021-03-28 DIAGNOSIS — G43909 Migraine, unspecified, not intractable, without status migrainosus: Secondary | ICD-10-CM | POA: Insufficient documentation

## 2021-03-28 MED ORDER — PREDNISONE 10 MG PO TABS
10.0000 mg | ORAL_TABLET | Freq: Every day | ORAL | 0 refills | Status: DC
Start: 2021-03-28 — End: 2021-05-02

## 2021-03-28 MED ORDER — TRIAMCINOLONE ACETONIDE 0.5 % EX OINT: 1.0000 "application " | TOPICAL_OINTMENT | Freq: Two times a day (BID) | CUTANEOUS | 0 refills | Status: DC

## 2021-03-28 MED ORDER — SUMATRIPTAN SUCCINATE 50 MG PO TABS: 50.0000 mg | ORAL_TABLET | ORAL | 0 refills | Status: DC | PRN

## 2021-03-28 NOTE — Assessment & Plan Note (Signed)
Follow-up with sports medicine.  Has underlying osteoarthritis.

## 2021-03-28 NOTE — Assessment & Plan Note (Signed)
No red flags.  She does not remember the name of the medication she is on in the past.  Discussed treatment options.  We will start Imitrex.  May consider preventive therapy in the future though hopefully her frequency of migraines will lessen as her stress improves.

## 2021-03-28 NOTE — Assessment & Plan Note (Signed)
Worsened due to recent life stressors.  She is on diazepam as needed.

## 2021-03-28 NOTE — Assessment & Plan Note (Signed)
Recent LDL was not at goal.  She is on Lipitor 20 mg daily though she has not been taking consistently.  She will try to be more consistent with taking and we can recheck lipids again in a few months.

## 2021-03-28 NOTE — Assessment & Plan Note (Signed)
Patient did not think doxepin worked well is no longer taking this.  Will not make any further changes today though may consider trial of gabapentin or Lyrica in the future.

## 2021-03-28 NOTE — Progress Notes (Signed)
Patricia Underwood is a 70 y.o. female who presents today for an office visit.  Assessment/Plan:  New/Acute Problems: Skin lesion Discussed with patient difficult to arrival diagnosis now that symptoms have mostly resolved.  Sounds like she possibly had spongiform dermatitis.  Contact dermatitis also possible though she does not remember any obvious exposures.  We will send in prescription for triamcinolone to use as needed.  She will send me a picture when the symptoms return.  May need referral to dermatology or biopsy if not improving.  Chronic Problems Addressed Today: Rheumatoid arthritis (HCC) She has been following with rheumatology for this.  She was recently told that she would have to be put on hydroxychloroquine.  Currently she only takes prednisone low-dose as needed for flareups.  She does not want to start hydroxychloroquine and would like to have referral to a another rheumatologist for second opinion.  Will place referral today.  Also refill her prednisone.  Anxiety Worsened due to recent life stressors.  She is on diazepam as needed.  Fibromyalgia Patient did not think doxepin worked well is no longer taking this.  Will not make any further changes today though may consider trial of gabapentin or Lyrica in the future.  HLD (hyperlipidemia) Recent LDL was not at goal.  She is on Lipitor 20 mg daily though she has not been taking consistently.  She will try to be more consistent with taking and we can recheck lipids again in a few months.  Knee pain Follow-up with sports medicine.  Has underlying osteoarthritis.  Migraine No red flags.  She does not remember the name of the medication she is on in the past.  Discussed treatment options.  We will start Imitrex.  May consider preventive therapy in the future though hopefully her frequency of migraines will lessen as her stress improves.      Subjective:  HPI:  Patient here with multiple issues that she would like to discuss  today.  Please see A/P for status of chronic conditions  She has had progressively more frequent headaches over the last few weeks.  She has been dealing with a lot of stress due to the health of her dog.  Her dog has a tumor and will be getting surgery next week.  This has caused a significant increase in her anxiety levels.  Also has caused some difficulty with sleeping.  Her headaches are usually pretty tolerable.  They usually resolve after period of time.  She has not tried any specific medications.  No reported weakness or numbness.  No nausea or vomiting.  No vision changes.  She has had migraines in the past and has been on migraine medication but does not remember the name of the medication.  She has also had intermittent skin lesions on her left upper extremity for the past several months.  Area will become red and inflamed and then developed into a blister.  This most recently happened a few days ago.  Says that the blister was large and fluid-filled.  The blister popped and a clear fluid came out.  No pain.  No itching.  No specific treatments tried.  No obvious exposures or precipitating events.       Objective:  Physical Exam: BP 116/69   Pulse 78   Temp 98 F (36.7 C) (Temporal)   Ht 5\' 3"  (1.6 m)   Wt 106 lb 12.8 oz (48.4 kg)   SpO2 96%   BMI 18.92 kg/m   Gen: No acute  distress, resting comfortably CV: Regular rate and rhythm with no murmurs appreciated Pulm: Normal work of breathing, clear to auscultation bilaterally with no crackles, wheezes, or rhonchi Skin: Small area of granulation tissue on left upper extremity.  No signs of cellulitis or infection. Neuro: Cranial nerves II through XII intact.  Finger-nose-finger testing intact bilaterally.  Strength 5 out of 5 in upper and lower extremities.  No sensory deficits. Psych: Normal affect and thought content  Time Spent: 45 minutes of total time was spent on the date of the encounter performing the following actions:  chart review prior to seeing the patient, obtaining history, performing a medically necessary exam, counseling on the treatment plan, placing orders, and documenting in our EHR.        Katina Degree. Jimmey Ralph, MD 03/28/2021 9:12 AM

## 2021-03-28 NOTE — Patient Instructions (Addendum)
It was very nice to see you today!  Please start Imitrex as needed for your headaches.  Let me know how this works for you over the next couple of weeks.  Please start the triamcinolone for your rash if it returns.  Please take a picture and send it to me if it comes back.  I will refill your prednisone and place referral for you to see a rheumatologist in the area.  We can recheck your cholesterol levels in a few months.  Take care, Dr Jimmey Ralph  PLEASE NOTE:  If you had any lab tests please let us know if you have not heard back within a few days. You may see your results on mychart before we have a chance to review them but we will give you a call once they are reviewed by [place]. If we ordered any referrals today, please let us know if you have not heard from their office within the next week.   Please try these tips to maintain a healthy lifestyle:   Eat at least 3 REAL meals and 1-2 snacks per day.  Aim for no more than 5 hours between eating.  If you eat breakfast, please do so within one hour of getting up.    Each meal should contain half fruits/vegetables, one quarter protein, and one quarter carbs (no bigger than a computer mouse)   Cut down on sweet beverages. This includes juice, soda, and sweet tea.     Drink at least 1 glass of water with each meal and aim for at least 8 glasses per day   Exercise at least 150 minutes every week.

## 2021-03-28 NOTE — Assessment & Plan Note (Signed)
She has been following with rheumatology for this.  She was recently told that she would have to be put on hydroxychloroquine.  Currently she only takes prednisone low-dose as needed for flareups.  She does not want to start hydroxychloroquine and would like to have referral to a another rheumatologist for second opinion.  Will place referral today.  Also refill her prednisone.

## 2021-04-02 ENCOUNTER — Ambulatory Visit: Payer: Medicare Other | Admitting: Endocrinology

## 2021-04-02 NOTE — Progress Notes (Deleted)
   I, [name], LAT, ATC, am serving as scribe for Dr. Clementeen Graham.  Roxane Puerto is a 70 y.o. female who presents to [facility] today for f/u of R knee pain.  She was last seen by Dr. Denyse Amass on 03/04/21 and was shown a HEP and advised to use voltaren gel.  She had a R knee steroid injection w/ her PCP on 02/14/21.  Since her last visit, pt reports   Diagnostic imaging: R knee XR- 02/18/21  Pertinent review of systems: ***  Relevant historical information: ***   Exam:  There were no vitals taken for this visit. General: Well Developed, well nourished, and in no acute distress.   MSK: ***    Lab and Radiology Results No results found for this or any previous visit (from the past 72 hour(s)). No results found.     Assessment and Plan: 70 y.o. female with ***   PDMP not reviewed this encounter. No orders of the defined types were placed in this encounter.  No orders of the defined types were placed in this encounter.    Discussed warning signs or symptoms. Please see discharge instructions. Patient expresses understanding.   ***

## 2021-04-03 ENCOUNTER — Ambulatory Visit: Payer: Medicare Other | Admitting: Family Medicine

## 2021-04-16 DIAGNOSIS — R300 Dysuria: Secondary | ICD-10-CM | POA: Diagnosis not present

## 2021-04-16 DIAGNOSIS — M545 Low back pain, unspecified: Secondary | ICD-10-CM | POA: Diagnosis not present

## 2021-04-16 DIAGNOSIS — G8929 Other chronic pain: Secondary | ICD-10-CM | POA: Diagnosis not present

## 2021-04-16 DIAGNOSIS — Z79899 Other long term (current) drug therapy: Secondary | ICD-10-CM | POA: Diagnosis not present

## 2021-04-16 DIAGNOSIS — Z681 Body mass index (BMI) 19 or less, adult: Secondary | ICD-10-CM | POA: Diagnosis not present

## 2021-04-16 DIAGNOSIS — R030 Elevated blood-pressure reading, without diagnosis of hypertension: Secondary | ICD-10-CM | POA: Diagnosis not present

## 2021-04-24 ENCOUNTER — Ambulatory Visit: Payer: Medicare Other | Admitting: Family Medicine

## 2021-04-24 NOTE — Progress Notes (Deleted)
   I, [name], LAT, ATC acting as a scribe for Clementeen Graham, MD.  Patricia Underwood is a 70 y.o. female who presents to [facility] at [facility] today for f/u chronic R knee pain. MOI: Pt suffered a fall, landing on R knee. Pt has a PMHx of a meniscal tear. Pt received a R knee steroid injection by PCP on 02/14/21. Pt was last seen by Dr. Denyse Amass on 03/04/21 and advised to work on HEP and use Voltaren gel. Today, pt reports  Dx imaging: 02/18/21 R knee XR             11/17/17 R knee MRI             09/24/18 R knee XR  Pertinent review of systems: ***  Relevant historical information: ***   Exam:  There were no vitals taken for this visit. General: Well Developed, well nourished, and in no acute distress.   MSK:     Lab and Radiology Results No results found for this or any previous visit (from the past 72 hour(s)). No results found.     Assessment and Plan: 70 y.o. female with ***   PDMP not reviewed this encounter. No orders of the defined types were placed in this encounter.  No orders of the defined types were placed in this encounter.    Discussed warning signs or symptoms. Please see discharge instructions. Patient expresses understanding.

## 2021-04-29 DIAGNOSIS — R531 Weakness: Secondary | ICD-10-CM | POA: Diagnosis not present

## 2021-04-29 DIAGNOSIS — M2560 Stiffness of unspecified joint, not elsewhere classified: Secondary | ICD-10-CM | POA: Diagnosis not present

## 2021-04-29 DIAGNOSIS — M545 Low back pain, unspecified: Secondary | ICD-10-CM | POA: Diagnosis not present

## 2021-04-29 DIAGNOSIS — R2689 Other abnormalities of gait and mobility: Secondary | ICD-10-CM | POA: Diagnosis not present

## 2021-05-01 ENCOUNTER — Telehealth: Payer: Self-pay

## 2021-05-01 NOTE — Telephone Encounter (Signed)
  LAST APPOINTMENT DATE: 03/28/2021   NEXT APPOINTMENT DATE:05/02/21  MEDICATION:  predniSONE (DELTASONE) 10 MG tablet  PHARMACY:[id number] - [place], [place] - [address]   Please advise

## 2021-05-01 NOTE — Telephone Encounter (Signed)
Patient stated will like to continue with prednisone due to her been uncomfortable with her back  10mg  help her move around plus the PT

## 2021-05-01 NOTE — Telephone Encounter (Signed)
Please clarify with patient. We send this in a month ago for her to use as needed for flare ups.  Katina Degree. Jimmey Ralph, MD 05/01/2021 10:55 AM

## 2021-05-01 NOTE — Telephone Encounter (Signed)
Please advise 

## 2021-05-02 ENCOUNTER — Encounter: Payer: Self-pay | Admitting: Physician Assistant

## 2021-05-02 ENCOUNTER — Other Ambulatory Visit: Payer: Self-pay | Admitting: *Deleted

## 2021-05-02 ENCOUNTER — Ambulatory Visit (INDEPENDENT_AMBULATORY_CARE_PROVIDER_SITE_OTHER): Payer: Medicare Other | Admitting: Physician Assistant

## 2021-05-02 ENCOUNTER — Other Ambulatory Visit: Payer: Self-pay

## 2021-05-02 VITALS — BP 130/66 | HR 84 | Temp 97.2°F | Ht 63.0 in | Wt 105.2 lb

## 2021-05-02 DIAGNOSIS — M545 Low back pain, unspecified: Secondary | ICD-10-CM | POA: Diagnosis not present

## 2021-05-02 DIAGNOSIS — M797 Fibromyalgia: Secondary | ICD-10-CM | POA: Diagnosis not present

## 2021-05-02 DIAGNOSIS — G8929 Other chronic pain: Secondary | ICD-10-CM | POA: Diagnosis not present

## 2021-05-02 MED ORDER — PREDNISONE 10 MG PO TABS: 10.0000 mg | ORAL_TABLET | Freq: Every day | ORAL | 0 refills | Status: DC

## 2021-05-02 MED ORDER — TRIAZOLAM 0.25 MG PO TABS: 0.2500 mg | ORAL_TABLET | Freq: Every evening | ORAL | 5 refills | Status: DC | PRN

## 2021-05-02 NOTE — Progress Notes (Signed)
Acute Office Visit  Subjective:    Patient ID: Patricia Underwood, female    DOB: April 20, 1951, 70 y.o.   MRN: [id number]  Chief Complaint  Patient presents with   Back Pain   Joint Pain    HPI Patient is in today for back pain x 1 month. Started after sleeping "wrong on the couch." Mostly pain in lower left side, occasionally right. No numbness or tingling. No fever. No incontinence. She has a history of osteoarthritis and fibromyalgia. Prior history of cervical cancer approx 40 years ago. Recently just started physical therapy. She has taken prednisone previously and did well for acute flare-ups on pain with this medication.   Past Medical History:  Diagnosis Date   Aortic atherosclerosis ([place])    B12 deficiency, Rx B12 injections 07/05/2017   Chronic back pain    Chronic right shoulder pain 05/30/2017   Coronary artery calcification seen on CAT scan    Esophageal spasm    Fibromyalgia    HLD (hyperlipidemia) 05/30/2017    Past Surgical History:  Procedure Laterality Date   ABDOMINAL HYSTERECTOMY     KNEE SURGERY Right    LUMBAR FUSION     L4-L5   SHOULDER ARTHROSCOPY Right     Family History  Problem Relation Age of Onset   CAD Mother        died of massive MI at 65   CVA Father        x 2   Hyperlipidemia Sister    Diabetes Neg Hx     Social History   Socioeconomic History   Marital status: Married    Spouse name: Not on file   Number of children: Not on file   Years of education: Not on file   Highest education level: Not on file  Occupational History   Occupation: Retired   Tobacco Use   Smoking status: Former    Pack years: 0.00   Smokeless tobacco: Never   Tobacco comments:    quit in [place] 2021  Substance and Sexual Activity   Alcohol use: Yes    Alcohol/week: 7.0 standard drinks    Types: 7 Glasses of wine per week   Drug use: No   Sexual activity: Not Currently    Partners: Male    Birth control/protection: None  Other Topics Concern   Not on  file  Social History Narrative   Not on file   Social Determinants of Health   Financial Resource Strain: Not on file  Food Insecurity: Not on file  Transportation Needs: Not on file  Physical Activity: Not on file  Stress: Not on file  Social Connections: Not on file  Intimate Partner Violence: Not on file    Outpatient Medications Prior to Visit  Medication Sig Dispense Refill   albuterol (VENTOLIN HFA) 108 (90 Base) MCG/ACT inhaler INHALE 2 PUFFS INTO THE LUNGS EVERY 6 HOURS AS NEEDED FOR WHEEZING OR FOR SHORTNESS OF BREATH 18 g 2   atorvastatin (LIPITOR) 20 MG tablet Take 1 tablet (20 mg total) by mouth at bedtime. 90 tablet 1   blood glucose meter kit and supplies KIT Dispense based on patient and insurance preference. Use up to four times daily as directed. (FOR ICD-9 250.00, 250.01). 1 each 0   Cholecalciferol 1.25 MG (50000 UT) TABS 50,000 units PO qwk for 12 weeks. 12 tablet 0   cyanocobalamin (,VITAMIN B-12,) 1000 MCG/ML injection 1000 mcg (1 mg) injection once every other week 6 mL 3  diazepam (VALIUM) 5 MG tablet Take 1 tablet (5 mg total) by mouth every 12 (twelve) hours as needed for anxiety. 30 tablet 1   glucose blood test strip Check glucose 1 time daily. E11.9 100 each 12   pilocarpine (SALAGEN) 5 MG tablet Take 5 mg by mouth. 3 times a week as needed for saliva     SUMAtriptan (IMITREX) 50 MG tablet Take 1 tablet (50 mg total) by mouth every 2 (two) hours as needed for migraine. May repeat in 2 hours if headache persists or recurs. 10 tablet 0   Syringe/Needle, Disp, (SYRINGE 3CC/25GX1") 25G X 1" 3 ML MISC 1 application by Does not apply route once a week. 12 each 3   triamcinolone ointment (KENALOG) 0.5 % Apply 1 application topically 2 (two) times daily. 30 g 0   aspirin EC 81 MG tablet Take 1 tablet (81 mg total) by mouth daily. Swallow whole. 90 tablet 3   diclofenac (VOLTAREN) 75 MG EC tablet Take 1 tablet (75 mg total) by mouth 2 (two) times daily. 30 tablet 0    predniSONE (DELTASONE) 10 MG tablet Take 1 tablet (10 mg total) by mouth daily with breakfast. 30 tablet 0   triazolam (HALCION) 0.25 MG tablet Take 1 tablet (0.25 mg total) by mouth at bedtime as needed for sleep. 30 tablet 5   No facility-administered medications prior to visit.    Allergies  Allergen Reactions   Ambien [Zolpidem Tartrate]    Flexeril [Cyclobenzaprine] Other (See Comments)    Patient unsure of reaction   Naproxen Other (See Comments)    Patient unsure of reaction.    Review of Systems REFER TO HPI FOR PERTINENT POSITIVES AND NEGATIVES     Objective:    Physical Exam Vitals and nursing note reviewed.  Constitutional:      General: She is not in acute distress.    Appearance: Normal appearance. She is not ill-appearing.  Musculoskeletal:     Lumbar back: Bony tenderness (left lower back and hip) present. No swelling, edema or spasms. Normal range of motion. Negative right straight leg raise test and negative left straight leg raise test.  Neurological:     Mental Status: She is alert.     Sensory: Sensation is intact.     Motor: Motor function is intact.     Coordination: Coordination is intact.     Gait: Gait is intact.     Deep Tendon Reflexes: Reflexes are normal and symmetric.    BP 130/66   Pulse 84   Temp (!) 97.2 F (36.2 C)   Ht 5' 3"  (1.6 m)   Wt 105 lb 3.2 oz (47.7 kg)   SpO2 98%   BMI 18.64 kg/m  Wt Readings from Last 3 Encounters:  05/02/21 105 lb 3.2 oz (47.7 kg)  03/28/21 106 lb 12.8 oz (48.4 kg)  03/04/21 108 lb 12.8 oz (49.4 kg)    Health Maintenance Due  Topic Date Due   Zoster Vaccines- Shingrix (1 of 2) Never done   COVID-19 Vaccine (4 - Booster for Pfizer series) 11/06/2020    There are no preventive care reminders to display for this patient.   Lab Results  Component Value Date   TSH 1.02 02/18/2021   Lab Results  Component Value Date   WBC 7.0 03/01/2021   HGB 13.8 03/01/2021   HCT 41.4 03/01/2021   MCV  91.3 03/01/2021   PLT 317.0 03/01/2021   Lab Results  Component Value Date  NA 139 02/18/2021   K 4.5 02/18/2021   CO2 30 02/18/2021   GLUCOSE 103 (H) 02/18/2021   BUN 17 02/18/2021   CREATININE 0.62 02/18/2021   BILITOT 0.3 02/18/2021   ALKPHOS 66 02/18/2021   AST 14 02/18/2021   ALT 20 02/18/2021   PROT 6.9 02/18/2021   ALBUMIN 4.5 02/18/2021   CALCIUM 10.0 02/18/2021   GFR 90.41 02/18/2021   Lab Results  Component Value Date   CHOL 238 (H) 03/01/2021   Lab Results  Component Value Date   HDL 119 03/01/2021   Lab Results  Component Value Date   LDLCALC 99 03/01/2021   Lab Results  Component Value Date   TRIG 102 03/01/2021   Lab Results  Component Value Date   CHOLHDL 2.0 03/01/2021   Lab Results  Component Value Date   HGBA1C 5.9 02/18/2021       Assessment & Plan:   Problem List Items Addressed This Visit       Other   Fibromyalgia   Relevant Medications   predniSONE (DELTASONE) 10 MG tablet   Other Visit Diagnoses     Chronic left-sided low back pain without sciatica    -  Primary   Relevant Medications   predniSONE (DELTASONE) 10 MG tablet        Meds ordered this encounter  Medications   predniSONE (DELTASONE) 10 MG tablet    Sig: Take 1 tablet (10 mg total) by mouth daily with breakfast.    Dispense:  30 tablet    Refill:  0   1. Chronic left-sided low back pain without sciatica 2. Fibromyalgia Acute on chronic pain. No red flags. Reviewed prior XR and MRI reports. I do not have an indication to re-image today. Dr. Jerline Pain approved intermittent use of prednisone 10 mg for acute flare-ups of pain. She will continue PT and also try supportive care.    M , PA-C

## 2021-05-02 NOTE — Patient Instructions (Signed)
Continue physical therapy for your back pain.  You may try Salonpas Patches for 8 hours at a time. Heat or ice to area. Gentle massage. Prednisone as directed. Call back if any worse or no improvement.

## 2021-05-07 ENCOUNTER — Telehealth: Payer: Self-pay | Admitting: *Deleted

## 2021-05-07 NOTE — Telephone Encounter (Signed)
Approvedtoday PA Case: [id number], Status: Approved, Coverage Starts on: 05/07/2021 12:00:00 AM, Coverage Ends on: 11/09/2021 12:00:00 AM.

## 2021-05-07 NOTE — Telephone Encounter (Signed)
Pharmacy notified.

## 2021-05-07 NOTE — Telephone Encounter (Signed)
Key: [id number] - PA Case ID: [id number] - Status PA Sent to Plan today Drug Triazolam 0.25MG  tablets Waiting for determination

## 2021-05-10 DIAGNOSIS — M2560 Stiffness of unspecified joint, not elsewhere classified: Secondary | ICD-10-CM | POA: Diagnosis not present

## 2021-05-10 DIAGNOSIS — R2689 Other abnormalities of gait and mobility: Secondary | ICD-10-CM | POA: Diagnosis not present

## 2021-05-10 DIAGNOSIS — M545 Low back pain, unspecified: Secondary | ICD-10-CM | POA: Diagnosis not present

## 2021-05-10 DIAGNOSIS — R531 Weakness: Secondary | ICD-10-CM | POA: Diagnosis not present

## 2021-05-15 DIAGNOSIS — Z79899 Other long term (current) drug therapy: Secondary | ICD-10-CM | POA: Diagnosis not present

## 2021-05-15 DIAGNOSIS — Z681 Body mass index (BMI) 19 or less, adult: Secondary | ICD-10-CM | POA: Diagnosis not present

## 2021-05-15 DIAGNOSIS — M25511 Pain in right shoulder: Secondary | ICD-10-CM | POA: Diagnosis not present

## 2021-05-15 DIAGNOSIS — G8929 Other chronic pain: Secondary | ICD-10-CM | POA: Diagnosis not present

## 2021-05-15 DIAGNOSIS — R030 Elevated blood-pressure reading, without diagnosis of hypertension: Secondary | ICD-10-CM | POA: Diagnosis not present

## 2021-05-15 DIAGNOSIS — M545 Low back pain, unspecified: Secondary | ICD-10-CM | POA: Diagnosis not present

## 2021-05-20 ENCOUNTER — Telehealth: Payer: Self-pay

## 2021-05-20 DIAGNOSIS — R2689 Other abnormalities of gait and mobility: Secondary | ICD-10-CM | POA: Diagnosis not present

## 2021-05-20 DIAGNOSIS — M545 Low back pain, unspecified: Secondary | ICD-10-CM | POA: Diagnosis not present

## 2021-05-20 DIAGNOSIS — M2560 Stiffness of unspecified joint, not elsewhere classified: Secondary | ICD-10-CM | POA: Diagnosis not present

## 2021-05-20 DIAGNOSIS — R531 Weakness: Secondary | ICD-10-CM | POA: Diagnosis not present
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
## 2021-05-21 ENCOUNTER — Other Ambulatory Visit: Payer: Self-pay

## 2021-05-21 ENCOUNTER — Ambulatory Visit (INDEPENDENT_AMBULATORY_CARE_PROVIDER_SITE_OTHER): Payer: Medicare Other | Admitting: Family Medicine

## 2021-05-21 ENCOUNTER — Encounter: Payer: Self-pay | Admitting: Family Medicine

## 2021-05-21 VITALS — BP 132/80 | HR 76 | Temp 98.2°F | Ht 63.0 in | Wt 111.4 lb

## 2021-05-21 DIAGNOSIS — R739 Hyperglycemia, unspecified: Secondary | ICD-10-CM | POA: Diagnosis not present

## 2021-05-21 DIAGNOSIS — G43809 Other migraine, not intractable, without status migrainosus: Secondary | ICD-10-CM | POA: Diagnosis not present

## 2021-05-21 DIAGNOSIS — R413 Other amnesia: Secondary | ICD-10-CM

## 2021-05-21 DIAGNOSIS — E7800 Pure hypercholesterolemia, unspecified: Secondary | ICD-10-CM | POA: Diagnosis not present

## 2021-05-21 DIAGNOSIS — E538 Deficiency of other specified B group vitamins: Secondary | ICD-10-CM

## 2021-05-21 DIAGNOSIS — E559 Vitamin D deficiency, unspecified: Secondary | ICD-10-CM
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
## 2021-05-21 NOTE — Assessment & Plan Note (Signed)
Check lipids with blood draw.  

## 2021-05-21 NOTE — Patient Instructions (Signed)
It was very nice to see you today!  Please come back for your blood work.  We will also order an MRI.  We may need to send you to see a neurologist depending on the results of your labs.  Take care, Dr Jimmey Ralph  PLEASE NOTE:  If you had any lab tests please let us know if you have not heard back within a few days. You may see your results on mychart before we have a chance to review them but we will give you a call once they are reviewed by [place]. If we ordered any referrals today, please let us know if you have not heard from their office within the next week.   Please try these tips to maintain a healthy lifestyle:  Eat at least 3 REAL meals and 1-2 snacks per day.  Aim for no more than 5 hours between eating.  If you eat breakfast, please do so within one hour of getting up.   Each meal should contain half fruits/vegetables, one quarter protein, and one quarter carbs (no bigger than a computer mouse)  Cut down on sweet beverages. This includes juice, soda, and sweet tea.   Drink at least 1 glass of water with each meal and aim for at least 8 glasses per day  Exercise at least 150 minutes every week.

## 2021-05-21 NOTE — Assessment & Plan Note (Signed)
Could be contributing to her amnesia.  She uses Imitrex as needed for migraines.  No recent flares though has had more persistent migraines recently.

## 2021-05-21 NOTE — Assessment & Plan Note (Signed)
Could be causing some memory issues.  Check B12.

## 2021-05-21 NOTE — Telephone Encounter (Signed)
Pt has ov today 

## 2021-05-21 NOTE — Assessment & Plan Note (Signed)
Check vitamin D. 

## 2021-05-24 ENCOUNTER — Ambulatory Visit: Payer: Medicare Other | Admitting: Endocrinology

## 2021-05-27 DIAGNOSIS — R2689 Other abnormalities of gait and mobility: Secondary | ICD-10-CM | POA: Diagnosis not present

## 2021-05-27 DIAGNOSIS — M545 Low back pain, unspecified: Secondary | ICD-10-CM | POA: Diagnosis not present

## 2021-05-27 DIAGNOSIS — R531 Weakness: Secondary | ICD-10-CM | POA: Diagnosis not present

## 2021-05-27 DIAGNOSIS — M2560 Stiffness of unspecified joint, not elsewhere classified: Secondary | ICD-10-CM | POA: Diagnosis not present

## 2021-05-28 DIAGNOSIS — M25511 Pain in right shoulder: Secondary | ICD-10-CM | POA: Diagnosis not present

## 2021-05-29 ENCOUNTER — Other Ambulatory Visit: Payer: Medicare Other

## 2021-05-30 ENCOUNTER — Other Ambulatory Visit (INDEPENDENT_AMBULATORY_CARE_PROVIDER_SITE_OTHER): Payer: Medicare Other

## 2021-05-30 DIAGNOSIS — E559 Vitamin D deficiency, unspecified: Secondary | ICD-10-CM | POA: Diagnosis not present

## 2021-05-30 DIAGNOSIS — E538 Deficiency of other specified B group vitamins: Secondary | ICD-10-CM

## 2021-05-30 DIAGNOSIS — R739 Hyperglycemia, unspecified: Secondary | ICD-10-CM

## 2021-05-30 DIAGNOSIS — E7800 Pure hypercholesterolemia, unspecified: Secondary | ICD-10-CM | POA: Diagnosis not present

## 2021-05-30 DIAGNOSIS — Z23 Encounter for immunization: Secondary | ICD-10-CM | POA: Diagnosis not present

## 2021-05-30 DIAGNOSIS — R413 Other amnesia: Secondary | ICD-10-CM

## 2021-05-30 LAB — LIPID PANEL
Cholesterol: 240 mg/dL — ABNORMAL HIGH (ref 0–200)
HDL: 88.7 mg/dL (ref >39.00)
LDL Cholesterol: 136 mg/dL — ABNORMAL HIGH (ref 0–99)
NonHDL: 151.37
Total CHOL/HDL Ratio: 3
Triglycerides: 79 mg/dL (ref 0.0–149.0)
VLDL: 15.8 mg/dL (ref 0.0–40.0)

## 2021-05-30 LAB — COMPREHENSIVE METABOLIC PANEL
ALT: 16 U/L (ref 0–35)
AST: 20 U/L (ref 0–37)
Albumin: 4.3 g/dL (ref 3.5–5.2)
Alkaline Phosphatase: 73 U/L (ref 39–117)
BUN: 18 mg/dL (ref 6–23)
CO2: 30 mEq/L (ref 19–32)
Calcium: 9.2 mg/dL (ref 8.4–10.5)
Chloride: 102 mEq/L (ref 96–112)
Creatinine, Ser: 0.74 mg/dL (ref 0.40–1.20)
GFR: 81.98 mL/min (ref >60.00)
Glucose, Bld: 96 mg/dL (ref 70–99)
Potassium: 3.9 mEq/L (ref 3.5–5.1)
Sodium: 140 mEq/L (ref 135–145)
Total Bilirubin: 0.5 mg/dL (ref 0.2–1.2)
Total Protein: 6.8 g/dL (ref 6.0–8.3)

## 2021-05-30 LAB — CBC
HCT: 38 % (ref 36.0–46.0)
Hemoglobin: 12.7 g/dL (ref 12.0–15.0)
MCHC: 33.4 g/dL (ref 30.0–36.0)
MCV: 93.4 fl (ref 78.0–100.0)
Platelets: 303 10*3/uL (ref 150.0–400.0)
RBC: 4.06 Mil/uL (ref 3.87–5.11)
RDW: 13.2 % (ref 11.5–15.5)
WBC: 4.9 10*3/uL (ref 4.0–10.5)

## 2021-05-30 LAB — TSH: TSH: 2.61 u[IU]/mL (ref 0.35–5.50)

## 2021-05-30 LAB — HEMOGLOBIN A1C: Hgb A1c MFr Bld: 5.9 % (ref 4.6–6.5)

## 2021-05-30 LAB — VITAMIN D 25 HYDROXY (VIT D DEFICIENCY, FRACTURES): VITD: 40.79 ng/mL (ref 30.00–100.00)

## 2021-05-30 LAB — VITAMIN B12: Vitamin B-12: 547 pg/mL (ref 211–911)

## 2021-06-03 ENCOUNTER — Other Ambulatory Visit: Payer: Medicare Other

## 2021-06-03 NOTE — Progress Notes (Signed)
Please inform patient of the following:  Cholesterol and blood sugar a bit borderline but everything else is normal including her B12.  We are still waiting on her MRI however if she wishes to be referred to neurologist for her memory issues we can go ahead and do so while we are waiting on MRI.  Patricia Underwood. Jimmey Ralph, MD 06/03/2021 12:33 PM

## 2021-06-04 ENCOUNTER — Ambulatory Visit: Payer: Medicare Other | Admitting: Family Medicine

## 2021-06-06 ENCOUNTER — Other Ambulatory Visit: Payer: Medicare Other

## 2021-06-11 ENCOUNTER — Telehealth: Payer: Self-pay

## 2021-06-11 DIAGNOSIS — R413 Other amnesia: Secondary | ICD-10-CM

## 2021-06-11 NOTE — Telephone Encounter (Signed)
Patient is requesting a referral to  neurologist dr.david meyer at [facility] phone# [phone] for her memory loss symptoms

## 2021-06-11 NOTE — Telephone Encounter (Signed)
Please adfvise

## 2021-06-12 DIAGNOSIS — Z79899 Other long term (current) drug therapy: Secondary | ICD-10-CM | POA: Diagnosis not present

## 2021-06-12 DIAGNOSIS — Z681 Body mass index (BMI) 19 or less, adult: Secondary | ICD-10-CM | POA: Diagnosis not present

## 2021-06-12 DIAGNOSIS — R030 Elevated blood-pressure reading, without diagnosis of hypertension: Secondary | ICD-10-CM | POA: Diagnosis not present

## 2021-06-12 DIAGNOSIS — M545 Low back pain, unspecified: Secondary | ICD-10-CM | POA: Diagnosis not present

## 2021-06-12 DIAGNOSIS — Z1211 Encounter for screening for malignant neoplasm of colon: Secondary | ICD-10-CM | POA: Diagnosis not present

## 2021-06-12 DIAGNOSIS — G8929 Other chronic pain: Secondary | ICD-10-CM | POA: Diagnosis not present

## 2021-06-12 NOTE — Telephone Encounter (Signed)
Spoke to pt told her referral has been placed as she requested for Neurology. Someone will contact you to schedule an appointment. Pt verbalized understanding.

## 2021-06-12 NOTE — Telephone Encounter (Signed)
Ok with me. Please place any necessary orders. 

## 2021-06-14 NOTE — Telephone Encounter (Signed)
Misty Stanley, did you take care of this?

## 2021-06-14 NOTE — Telephone Encounter (Signed)
Patient called again about the referral for the neurologist again. I told her that it was sent and she should be getting a call about scheduling. She stated that she called the Neurologist today and they have no received a referral from [place]. She stated that someone is not doing it correctly and it needs to be done today. I talked to Misty Stanley and she stated that she will fax and call the office. Shifa then requested to speak to Misty Stanley because she was not doing it correctly

## 2021-06-19 ENCOUNTER — Telehealth: Payer: Self-pay | Admitting: *Deleted

## 2021-06-19 DIAGNOSIS — R413 Other amnesia: Secondary | ICD-10-CM | POA: Diagnosis not present

## 2021-06-19 NOTE — Telephone Encounter (Signed)
Patient requesting lab form July and April to be printed  Lab printed and placed at front office

## 2021-06-20 ENCOUNTER — Telehealth: Payer: Self-pay | Admitting: Family Medicine

## 2021-06-20 ENCOUNTER — Other Ambulatory Visit: Payer: Medicare Other

## 2021-06-20 NOTE — Telephone Encounter (Signed)
Copied from CRM [id number]. Topic: Medicare AWV >> Jun 20, 2021  9:25 AM Harris-Coley, Avon Gully wrote: Reason for CRM: Left message for patient to schedule Annual Wellness Visit.  Please schedule with Nurse Health Advisor Lanier Ensign, RN at [facility].  Please call [phone] ask for [facility]

## 2021-06-25 ENCOUNTER — Ambulatory Visit: Payer: Medicare Other | Admitting: Family Medicine

## 2021-06-26 NOTE — Telephone Encounter (Signed)
Noted  

## 2021-06-27 DIAGNOSIS — R413 Other amnesia: Secondary | ICD-10-CM | POA: Diagnosis not present

## 2021-07-02 ENCOUNTER — Ambulatory Visit: Payer: Medicare Other | Admitting: Family Medicine

## 2021-07-05 DIAGNOSIS — R413 Other amnesia: Secondary | ICD-10-CM | POA: Diagnosis not present

## 2021-07-05 DIAGNOSIS — R2990 Unspecified symptoms and signs involving the nervous system: Secondary | ICD-10-CM | POA: Diagnosis not present

## 2021-07-12 DIAGNOSIS — M545 Low back pain, unspecified: Secondary | ICD-10-CM | POA: Diagnosis not present

## 2021-07-12 DIAGNOSIS — G8929 Other chronic pain: Secondary | ICD-10-CM | POA: Diagnosis not present

## 2021-07-12 DIAGNOSIS — R030 Elevated blood-pressure reading, without diagnosis of hypertension: Secondary | ICD-10-CM | POA: Diagnosis not present

## 2021-07-12 DIAGNOSIS — Z79899 Other long term (current) drug therapy: Secondary | ICD-10-CM | POA: Diagnosis not present

## 2021-07-12 DIAGNOSIS — Z1211 Encounter for screening for malignant neoplasm of colon: Secondary | ICD-10-CM | POA: Diagnosis not present

## 2021-07-12 DIAGNOSIS — Z681 Body mass index (BMI) 19 or less, adult: Secondary | ICD-10-CM | POA: Diagnosis not present

## 2021-07-12 DIAGNOSIS — R413 Other amnesia: Secondary | ICD-10-CM | POA: Diagnosis not present

## 2021-07-23 ENCOUNTER — Ambulatory Visit (INDEPENDENT_AMBULATORY_CARE_PROVIDER_SITE_OTHER): Payer: Medicare Other | Admitting: Family Medicine

## 2021-07-23 ENCOUNTER — Encounter: Payer: Self-pay | Admitting: Family Medicine

## 2021-07-23 ENCOUNTER — Other Ambulatory Visit: Payer: Self-pay

## 2021-07-23 DIAGNOSIS — R413 Other amnesia: Secondary | ICD-10-CM | POA: Diagnosis not present

## 2021-07-23 DIAGNOSIS — I2584 Coronary atherosclerosis due to calcified coronary lesion: Secondary | ICD-10-CM

## 2021-07-23 DIAGNOSIS — E782 Mixed hyperlipidemia: Secondary | ICD-10-CM | POA: Diagnosis not present

## 2021-07-23 DIAGNOSIS — I2510 Atherosclerotic heart disease of native coronary artery without angina pectoris: Secondary | ICD-10-CM

## 2021-07-23 DIAGNOSIS — Z1211 Encounter for screening for malignant neoplasm of colon: Secondary | ICD-10-CM | POA: Diagnosis not present

## 2021-07-23 MED ORDER — ATORVASTATIN CALCIUM 20 MG PO TABS: 20.0000 mg | ORAL_TABLET | Freq: Every day | ORAL | 1 refills | Status: DC

## 2021-07-23 NOTE — Patient Instructions (Signed)
It was very nice to see you today!  Make an ultrasound of the arteries in your neck.  Please call [facility] [phone].  To schedule this.  It is okay for you to start the medication prescribed by your neurologist.  Take care, Dr Jimmey Ralph  PLEASE NOTE:  If you had any lab tests please let us know if you have not heard back within a few days. You may see your results on mychart before we have a chance to review them but we will give you a call once they are reviewed by [place]. If we ordered any referrals today, please let us know if you have not heard from their office within the next week.   Please try these tips to maintain a healthy lifestyle:  Eat at least 3 REAL meals and 1-2 snacks per day.  Aim for no more than 5 hours between eating.  If you eat breakfast, please do so within one hour of getting up.   Each meal should contain half fruits/vegetables, one quarter protein, and one quarter carbs (no bigger than a computer mouse)  Cut down on sweet beverages. This includes juice, soda, and sweet tea.   Drink at least 1 glass of water with each meal and aim for at least 8 glasses per day  Exercise at least 150 minutes every week.

## 2021-07-23 NOTE — Progress Notes (Signed)
   Patricia Underwood is a 70 y.o. female who presents today for an office visit.  Assessment/Plan:  New/Acute Problems: Amnesia This possible that her episodes from a couple of months ago could have been a TIA.  She does have a known history of coronary artery disease.  Her recent MRI showed only chronic microvascular changes without any signs of major stroke.  We will check carotid ultrasound to evaluate for carotid artery vascular disease.  She has already on aspirin and statin for her CAD.   Chronic Problems Addressed Today: Short-term memory loss Following with neurology.  She has not yet started donepezil.  Recommended patient to start per neurology instructions.  She will be following up with them again in a couple of months.  Coronary artery disease due to calcified coronary lesion Follows with cardiology.  Will send in atorvastatin today.     Subjective:  HPI:  Patient here for follow-up.  We last saw her a couple months ago.  At that time sheHad an acute episode of forgetfulness.  She has been having ongoing issues with short-term memory loss for the last several months previous to this.  We did work up at that time including lab work which was essentially normal.  Recommended brain MRI as the next step however she elected to see neurology first.  She saw the neurologist about 5 weeks ago.  Had EEG and MRI which were unrevealing.  Neurology prescribed her donepezil which she has not yet started due to concern for side effects.  She has not had any global amnesia events since then however still has issues with short-term memory recall.       Objective:  Physical Exam: BP 123/75   Pulse 69   Temp 98.2 F (36.8 C) (Temporal)   Ht 5\' 3"  (1.6 m)   Wt 106 lb 9.6 oz (48.4 kg)   SpO2 97%   BMI 18.88 kg/m   Gen: No acute distress, resting comfortably CV: Regular rate and rhythm with no murmurs appreciated.  No carotid bruits. Pulm: Normal work of breathing, clear to auscultation  bilaterally with no crackles, wheezes, or rhonchi Neuro: Grossly normal, moves all extremities Psych: Normal affect and thought content  Time Spent: 45 minutes of total time was spent on the date of the encounter performing the following actions: chart review prior to seeing the patient including her recent neurology visit and work up performed there, obtaining history, performing a medically necessary exam, counseling on the treatment plan, placing orders, and documenting in our EHR.        . Katina Degree, MD 07/23/2021 10:59 AM

## 2021-07-23 NOTE — Assessment & Plan Note (Signed)
Following with neurology.  She has not yet started donepezil.  Recommended patient to start per neurology instructions.  She will be following up with them again in a couple of months.

## 2021-07-23 NOTE — Assessment & Plan Note (Signed)
Follows with cardiology.  Will send in atorvastatin today.

## 2021-07-26 ENCOUNTER — Ambulatory Visit
Admission: RE | Admit: 2021-07-26 | Discharge: 2021-07-26 | Disposition: A | Discharge status: Home / Self Care | Payer: Medicare Other | Source: Ambulatory Visit | Attending: Family Medicine | Admitting: Family Medicine

## 2021-07-26 DIAGNOSIS — I6523 Occlusion and stenosis of bilateral carotid arteries: Secondary | ICD-10-CM | POA: Diagnosis not present

## 2021-07-26 DIAGNOSIS — R413 Other amnesia: Secondary | ICD-10-CM

## 2021-07-29 NOTE — Progress Notes (Signed)
Please inform patient of the following:  Ultrasound shows mild plaque in her carotid arteries. It is not advanced enough for [place] to do any invasive interventions at this time. We should continue working on cholesterol and blood pressure reduction.   Patricia Underwood. Jimmey Ralph, MD 07/29/2021 2:36 PM

## 2021-07-31 ENCOUNTER — Telehealth: Payer: Self-pay

## 2021-07-31 NOTE — Telephone Encounter (Signed)
Patient calling back about results patient would like a call back after 1pm she is going into another Doctor appt until then.

## 2021-08-01 NOTE — Telephone Encounter (Signed)
Left message to return call to our office at their convenience.  

## 2021-08-01 NOTE — Telephone Encounter (Signed)
Lab results given see results note

## 2021-08-12 DIAGNOSIS — Z79899 Other long term (current) drug therapy: Secondary | ICD-10-CM | POA: Diagnosis not present

## 2021-08-14 DIAGNOSIS — Z79899 Other long term (current) drug therapy: Secondary | ICD-10-CM | POA: Diagnosis not present

## 2021-09-02 ENCOUNTER — Ambulatory Visit (INDEPENDENT_AMBULATORY_CARE_PROVIDER_SITE_OTHER): Payer: Medicare Other | Admitting: Family Medicine

## 2021-09-02 ENCOUNTER — Encounter: Payer: Self-pay | Admitting: Family Medicine

## 2021-09-02 ENCOUNTER — Other Ambulatory Visit: Payer: Self-pay

## 2021-09-02 VITALS — BP 133/72 | HR 72 | Temp 97.6°F | Ht 63.0 in | Wt 108.6 lb

## 2021-09-02 DIAGNOSIS — E559 Vitamin D deficiency, unspecified: Secondary | ICD-10-CM

## 2021-09-02 DIAGNOSIS — M069 Rheumatoid arthritis, unspecified: Secondary | ICD-10-CM

## 2021-09-02 DIAGNOSIS — M5412 Radiculopathy, cervical region: Secondary | ICD-10-CM

## 2021-09-02 MED ORDER — PREDNISONE 50 MG PO TABS: ORAL_TABLET | ORAL | 0 refills | Status: DC

## 2021-09-02 NOTE — Progress Notes (Signed)
   Patricia Underwood is a 70 y.o. female who presents today for an office visit.  Assessment/Plan:  New/Acute Problems: Paresthesias Likely multifactorial.  She may have an underlying element of carpal tunnel or cubital tunnel syndrome.  Her Spurling test today was negative.  We will start prednisone burst.  Hopefully this should help some with her arthritis flare and trigger finger as well.  She will follow-up with sports medicine to discuss neck steps if symptoms persist.  Trigger Finger Deferred injection today.  Of low have some improvement with prednisone.  She will follow-up with sports medicine as above.  Chronic Problems Addressed Today: Rheumatoid arthritis (HCC) She has had worsening pain in her bilateral hands.  She has had prednisone in the past for flareups.  We will start prednisone burst today.  This should help some with her trigger finger and her cervical radiculopathy as well.  Could consider extended course of low-dose prednisone as she has had in the past if this continues to be an issue.  Vitamin D deficiency Check vitamin D next blood draw.     Subjective:  HPI:  Patient here with left arm and hand pain.  This started 6 months ago.  She wakes up every night with pain and sensation of numbness in her left arm and hand.  She has had issues with carpal tunnel in the past.  She has also had worsening hand pain that she attributes to her rheumatoid arthritis.  She has seen rheumatology in the past.  Was directed to start hydroxychloroquine but did not wish to do so.  She hass also noticed trigger finger in her left third digit.       Objective:  Physical Exam: BP 133/72   Pulse 72   Temp 97.6 F (36.4 C) (Temporal)   Ht 5\' 3"  (1.6 m)   Wt 108 lb 9.6 oz (49.3 kg)   SpO2 100%   BMI 19.24 kg/m   Gen: No acute distress, resting comfortably MSK: - Left Hand: Arthritic changes noted however no deformities.  Trigger finger on left third digit.  Tinel's sign negative at  wrist however positive at medial epicondyle.  Spurling test negative. Neuro: Grossly normal, moves all extremities Psych: Normal affect and thought content       M. , MD 09/02/2021 11:53 AM

## 2021-09-02 NOTE — Patient Instructions (Signed)
It was very nice to see you today!  Please start the prednisone.  Send a message later this week to let me know how you are doing.  Please call Dr. Zollie Pee office to schedule follow-up.  Take care, Dr Jimmey Ralph  PLEASE NOTE:  If you had any lab tests please let us know if you have not heard back within a few days. You may see your results on mychart before we have a chance to review them but we will give you a call once they are reviewed by [place]. If we ordered any referrals today, please let us know if you have not heard from their office within the next week.   Please try these tips to maintain a healthy lifestyle:  Eat at least 3 REAL meals and 1-2 snacks per day.  Aim for no more than 5 hours between eating.  If you eat breakfast, please do so within one hour of getting up.   Each meal should contain half fruits/vegetables, one quarter protein, and one quarter carbs (no bigger than a computer mouse)  Cut down on sweet beverages. This includes juice, soda, and sweet tea.   Drink at least 1 glass of water with each meal and aim for at least 8 glasses per day  Exercise at least 150 minutes every week.

## 2021-09-02 NOTE — Assessment & Plan Note (Signed)
Check vitamin D next blood draw. 

## 2021-09-02 NOTE — Assessment & Plan Note (Addendum)
She has had worsening pain in her bilateral hands.  She has had prednisone in the past for flareups.  We will start prednisone burst today.  This should help some with her trigger finger and her cervical radiculopathy as well.  Could consider extended course of low-dose prednisone as she has had in the past if this continues to be an issue.

## 2021-09-05 ENCOUNTER — Telehealth: Payer: Self-pay

## 2021-09-05 NOTE — Telephone Encounter (Signed)
Ok to refill her prednisone rx.  Katina Degree. Jimmey Ralph, MD 09/05/2021 12:46 PM

## 2021-09-05 NOTE — Telephone Encounter (Signed)
Pt called wanting to update Dr Jimmey Ralph on taking the Prednisone. She stated that she is good on the medication and would like to continue taking it for another week. Galadriel would like a call back when medication is called in. Please Advise.

## 2021-09-05 NOTE — Telephone Encounter (Signed)
See note

## 2021-09-06 ENCOUNTER — Other Ambulatory Visit: Payer: Self-pay | Admitting: *Deleted

## 2021-09-06 DIAGNOSIS — Z23 Encounter for immunization: Secondary | ICD-10-CM | POA: Diagnosis not present

## 2021-09-06 MED ORDER — PREDNISONE 50 MG PO TABS: ORAL_TABLET | ORAL | 0 refills | Status: DC

## 2021-09-06 NOTE — Telephone Encounter (Signed)
Rx send to pharmacy  

## 2021-09-10 DIAGNOSIS — Z79899 Other long term (current) drug therapy: Secondary | ICD-10-CM | POA: Diagnosis not present

## 2021-09-13 ENCOUNTER — Other Ambulatory Visit: Payer: Self-pay | Admitting: *Deleted

## 2021-09-13 ENCOUNTER — Telehealth: Payer: Self-pay

## 2021-09-13 MED ORDER — PREDNISONE 50 MG PO TABS: ORAL_TABLET | ORAL | 0 refills | Status: DC

## 2021-09-13 NOTE — Telephone Encounter (Signed)
Please advise 

## 2021-09-13 NOTE — Telephone Encounter (Signed)
Patient calling back and would like a call back asap she would like this to get taken care of before the weekend.

## 2021-09-13 NOTE — Telephone Encounter (Signed)
Rx send to pharmacy. Left voice message to patient with information

## 2021-09-13 NOTE — Telephone Encounter (Signed)
Ok to send in 50 mg tablet with instruction to take half tablet for the next 4 days.   Should be 2 tablets sent in total.   M. Jimmey Ralph, MD 09/13/2021 3:15 PM

## 2021-09-13 NOTE — Telephone Encounter (Signed)
Pt called stating that she has been taking Prednisone for over week and she cannot find her prescription. Pt stated that she had about 2-3  pills left to slowly come off the medication. Pt stated that she cannot just stop the medication. Pt wants to know if she can have 2 pills called in to her pharmacy. Please Advise.

## 2021-09-16 ENCOUNTER — Other Ambulatory Visit: Payer: Self-pay

## 2021-09-16 ENCOUNTER — Ambulatory Visit (INDEPENDENT_AMBULATORY_CARE_PROVIDER_SITE_OTHER): Payer: Medicare Other

## 2021-09-16 ENCOUNTER — Ambulatory Visit (INDEPENDENT_AMBULATORY_CARE_PROVIDER_SITE_OTHER): Payer: Medicare Other | Admitting: Family Medicine

## 2021-09-16 VITALS — BP 132/78 | HR 70 | Ht 63.0 in | Wt 112.2 lb

## 2021-09-16 DIAGNOSIS — M5412 Radiculopathy, cervical region: Secondary | ICD-10-CM

## 2021-09-16 DIAGNOSIS — I2584 Coronary atherosclerosis due to calcified coronary lesion: Secondary | ICD-10-CM | POA: Diagnosis not present

## 2021-09-16 DIAGNOSIS — R29898 Other symptoms and signs involving the musculoskeletal system: Secondary | ICD-10-CM | POA: Diagnosis not present

## 2021-09-16 DIAGNOSIS — M542 Cervicalgia: Secondary | ICD-10-CM

## 2021-09-16 DIAGNOSIS — I2510 Atherosclerotic heart disease of native coronary artery without angina pectoris: Secondary | ICD-10-CM

## 2021-09-16 MED ORDER — GABAPENTIN 100 MG PO CAPS: 100.0000 mg | ORAL_CAPSULE | Freq: Three times a day (TID) | ORAL | 3 refills | Status: DC | PRN

## 2021-09-16 NOTE — Patient Instructions (Addendum)
Thank you for coming in today.   Please get an Xray today before you leave c-spine  I sent the prescription for Gabapentin to your pharmacy.  Recheck back in 1 month.

## 2021-09-16 NOTE — Progress Notes (Signed)
   I, Patricia Underwood, LAT, ATC, am serving as scribe for Dr. Clementeen Graham.  Patricia Underwood is a 70 y.o. female who presents to [facility] at [facility] today for L arm pain.  She was last seen by Dr. Denyse Underwood on 03/04/21 for R knee pain.  Today, she reports L arm pain that worsened over the past 2 months.  She locates her pain to L upper arm and describes pain as a "cramp." Pt notes that her PCP prescribed prednisone, which has resolved pain, taking the last dose this morning. Pt c/o increased pain when trying to sleep at night.  She notes some weakness to left arm elbow flexion.  Neck pain: no- but thoracic L UE numbness/tingling: no Aggravating factors: nothing in particular Treatments tried: prednisone, massage   Pertinent review of systems: No fevers or chills  Relevant historical information: Rheumatoid arthritis   Exam:  BP 132/78   Pulse 70   Ht 5\' 3"  (1.6 m)   Wt 112 lb 3.2 oz (50.9 kg)   SpO2 98%   BMI 19.88 kg/m  General: Well Developed, well nourished, and in no acute distress.   MSK: C-spine: Normal-appearing Nontender midline normal cervical motion. Negative Spurling's test. Left upper extremity strength is intact with exception of left elbow flexion which is diminished 4/5. Reflexes are intact. Sensation is intact.   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  No acute fractures. Await formal radiology review     Assessment and Plan: 70 y.o. female with left arm pain in the left anterior upper arm with some paresthesias distally associated with weakness to left elbow flexion.  This is all consistent with left C6 radiculopathy.  She does have degenerative changes most severe at C5-C6 on her cervical spine x-ray today per my read.  Radiology overread is still pending.  Her x-ray findings also correspond to C6 radiculopathy.  Additionally she has had response to the oral  steroids prescribed by her PCP.  However the steroids are ending today and will find out if it is going to be temporary response or more long-lasting response.  Will prescribe prednisone.  If the pain returns and weakness worsens we will proceed to MRI directly for further diagnostic and for potential epidural steroid injection planning.  Her weakness and progressive neurologic symptoms would push towards MRI faster.   PDMP not reviewed this encounter. Orders Placed This Encounter  Procedures   DG Cervical Spine 2 or 3 views    Standing Status:   Future    Number of Occurrences:   1    Standing Expiration Date:   09/16/2022    Order Specific Question:   Reason for Exam (SYMPTOM  OR DIAGNOSIS REQUIRED)    Answer:   neck pain    Order Specific Question:   Preferred imaging location?    Answer:   13/05/2022   Meds ordered this encounter  Medications   gabapentin (NEURONTIN) 100 MG capsule    Sig: Take 1 capsule (100 mg total) by mouth 3 (three) times daily as needed.    Dispense:  30 capsule    Refill:  3    Take 100-300mg  as needed     Discussed warning signs or symptoms. Please see discharge instructions. Patient expresses understanding.   The above documentation has been reviewed and is accurate and complete Kyra Searles, M.D.

## 2021-09-18 NOTE — Progress Notes (Signed)
Cervical spine x-ray shows multilevel arthritis changes in the neck.  If symptoms worsen an MRI would give [place] more information.

## 2021-09-23 ENCOUNTER — Telehealth: Payer: Self-pay

## 2021-09-23 NOTE — Telephone Encounter (Signed)
Pt called requesting a referral placed to [facility] with Dr. Barbee Cough. She stated that the referral can be faxed to [phone] with [facility]. Pt would also like her x-ray results sent over as well. Please Advise.

## 2021-09-25 ENCOUNTER — Other Ambulatory Visit: Payer: Self-pay

## 2021-09-25 ENCOUNTER — Ambulatory Visit (INDEPENDENT_AMBULATORY_CARE_PROVIDER_SITE_OTHER): Payer: Medicare Other | Admitting: Family Medicine

## 2021-09-25 ENCOUNTER — Other Ambulatory Visit: Payer: Self-pay | Admitting: *Deleted

## 2021-09-25 ENCOUNTER — Encounter: Payer: Self-pay | Admitting: Family Medicine

## 2021-09-25 VITALS — BP 135/72 | HR 73 | Temp 97.8°F | Ht 63.0 in | Wt 112.8 lb

## 2021-09-25 DIAGNOSIS — M5412 Radiculopathy, cervical region: Secondary | ICD-10-CM | POA: Insufficient documentation

## 2021-09-25 DIAGNOSIS — G8929 Other chronic pain: Secondary | ICD-10-CM | POA: Diagnosis not present

## 2021-09-25 DIAGNOSIS — L659 Nonscarring hair loss, unspecified: Secondary | ICD-10-CM | POA: Insufficient documentation

## 2021-09-25 DIAGNOSIS — M5440 Lumbago with sciatica, unspecified side: Secondary | ICD-10-CM | POA: Diagnosis not present

## 2021-09-25 DIAGNOSIS — M069 Rheumatoid arthritis, unspecified: Secondary | ICD-10-CM

## 2021-09-25 MED ORDER — PREDNISONE 20 MG PO TABS: 20.0000 mg | ORAL_TABLET | Freq: Every day | ORAL | 0 refills | Status: DC

## 2021-09-25 MED ORDER — MINOXIDIL 5 % EX FOAM: CUTANEOUS | 3 refills | Status: DC

## 2021-09-25 NOTE — Assessment & Plan Note (Addendum)
No red flags.  Had x-ray 2 years ago which showed stable degenerative changes stable hardware.  Given her current cervical radiculopathy as well as history of low back pain with sciatica will refer to neurosurgery per patient request.  Discussed reasons to return to care.

## 2021-09-25 NOTE — Assessment & Plan Note (Signed)
No red flags of persistent symptoms.  will place referral to neurosurgery and start prednisone burst.  she was prescribed gabapentin by sports medicine.

## 2021-09-25 NOTE — Telephone Encounter (Signed)
Referral placed  Patient had appointment today

## 2021-09-25 NOTE — Assessment & Plan Note (Addendum)
We will start minoxidil.  Can consider referral to dermatology if this continues to be an issue.

## 2021-09-25 NOTE — Patient Instructions (Signed)
It was very nice to see you today!  We will refer you to see a spine specialist and rheumatologist.  Please start the prednisone.  Please start the minoxidil.  Take care, Dr Jimmey Ralph  PLEASE NOTE:  If you had any lab tests please let us know if you have not heard back within a few days. You may see your results on mychart before we have a chance to review them but we will give you a call once they are reviewed by [place]. If we ordered any referrals today, please let us know if you have not heard from their office within the next week.   Please try these tips to maintain a healthy lifestyle:  Eat at least 3 REAL meals and 1-2 snacks per day.  Aim for no more than 5 hours between eating.  If you eat breakfast, please do so within one hour of getting up.   Each meal should contain half fruits/vegetables, one quarter protein, and one quarter carbs (no bigger than a computer mouse)  Cut down on sweet beverages. This includes juice, soda, and sweet tea.   Drink at least 1 glass of water with each meal and aim for at least 8 glasses per day  Exercise at least 150 minutes every week.

## 2021-09-25 NOTE — Assessment & Plan Note (Signed)
We will place referral for her to see rheumatology at [facility].  Her rheumatoid arthritis seems to be worsening.  We will start prednisone burst for the next 14 days.

## 2021-09-25 NOTE — Progress Notes (Signed)
   Patricia Underwood is a 70 y.o. female who presents today for an office visit.  Assessment/Plan:  Chronic Problems Addressed Today: Chronic low back pain with sciatica No red flags.  Had x-ray 2 years ago which showed stable degenerative changes stable hardware.  Given her current cervical radiculopathy as well as history of low back pain with sciatica will refer to neurosurgery per patient request.  Discussed reasons to return to care.  Rheumatoid arthritis (HCC) We will place referral for her to see rheumatology at [facility].  Her rheumatoid arthritis seems to be worsening.  We will start prednisone burst for the next 14 days.  Cervical radiculopathy No red flags of persistent symptoms.  will place referral to neurosurgery and start prednisone burst.  she was prescribed gabapentin by sports medicine.  Alopecia We will start minoxidil.  Can consider referral to dermatology if this continues to be an issue.    Subjective:  HPI:  Patient here for follow-up on cervical radiculopathy.  We saw her 1 month ago for this.  Since her last visit she did also follow-up with sports medicine.  Had x-ray performed which showed significant degenerative changes and concern for possible foraminal stenosis in C4 through C7.  MRI was recommended however she is reluctant to do this at this point.  We had given her a prednisone burst about a month ago which helped significantly with her symptoms though she stopped this a few weeks ago and symptoms have returned.  She also has an ongoing history of chronic low back pain with sciatica.  She had lumbar fusion performed about 6 years ago in [place].  She has had worsening issues with sciatica for the last week or so.  Patient also has history of rheumatoid arthritis.  She has been on chronic low-dose prednisone in the past.  Pain is most predominant in her bilateral hands.  She would like to be referred to see a rheumatologist.  She is not interested in  hydroxychloroquine.  She is also concerned about hair loss.  This has been going on for 1 months.  She is interested in possibly starting minoxidil.        Objective:  Physical Exam: BP 135/72   Pulse 73   Temp 97.8 F (36.6 C) (Temporal)   Ht 5\' 3"  (1.6 m)   Wt 112 lb 12.8 oz (51.2 kg)   SpO2 99%   BMI 19.98 kg/m   Gen: No acute distress, resting comfortably CV: Regular rate and rhythm with no murmurs appreciated Pulm: Normal work of breathing, clear to auscultation bilaterally with no crackles, wheezes, or rhonchi Neuro: Grossly normal, moves all extremities Psych: Normal affect and thought content       M. , MD 09/25/2021 10:13 AM

## 2021-09-27 DIAGNOSIS — Z20822 Contact with and (suspected) exposure to covid-19: Secondary | ICD-10-CM | POA: Diagnosis not present

## 2021-09-29 DIAGNOSIS — M545 Low back pain, unspecified: Secondary | ICD-10-CM | POA: Diagnosis not present

## 2021-09-29 DIAGNOSIS — R030 Elevated blood-pressure reading, without diagnosis of hypertension: Secondary | ICD-10-CM | POA: Diagnosis not present

## 2021-09-29 DIAGNOSIS — Z79899 Other long term (current) drug therapy: Secondary | ICD-10-CM | POA: Diagnosis not present

## 2021-09-29 DIAGNOSIS — G8929 Other chronic pain: Secondary | ICD-10-CM | POA: Diagnosis not present

## 2021-09-29 DIAGNOSIS — Z681 Body mass index (BMI) 19 or less, adult: Secondary | ICD-10-CM | POA: Diagnosis not present

## 2021-10-02 DIAGNOSIS — Z79899 Other long term (current) drug therapy: Secondary | ICD-10-CM | POA: Diagnosis not present

## 2021-10-07 ENCOUNTER — Ambulatory Visit: Payer: Medicare Other | Admitting: Cardiology

## 2021-10-10 ENCOUNTER — Ambulatory Visit: Payer: Medicare Other

## 2021-10-15 ENCOUNTER — Ambulatory Visit: Payer: Medicare Other | Admitting: Family Medicine

## 2021-10-18 ENCOUNTER — Telehealth: Payer: Self-pay

## 2021-10-18 ENCOUNTER — Telehealth (INDEPENDENT_AMBULATORY_CARE_PROVIDER_SITE_OTHER): Payer: Medicare Other | Admitting: Family Medicine

## 2021-10-18 VITALS — Ht 63.0 in | Wt 110.0 lb

## 2021-10-18 DIAGNOSIS — E038 Other specified hypothyroidism: Secondary | ICD-10-CM

## 2021-10-18 DIAGNOSIS — E538 Deficiency of other specified B group vitamins: Secondary | ICD-10-CM

## 2021-10-18 DIAGNOSIS — L650 Telogen effluvium: Secondary | ICD-10-CM | POA: Diagnosis not present

## 2021-10-18 DIAGNOSIS — L578 Other skin changes due to chronic exposure to nonionizing radiation: Secondary | ICD-10-CM | POA: Diagnosis not present

## 2021-10-18 DIAGNOSIS — F4323 Adjustment disorder with mixed anxiety and depressed mood: Secondary | ICD-10-CM | POA: Diagnosis not present

## 2021-10-18 DIAGNOSIS — E559 Vitamin D deficiency, unspecified: Secondary | ICD-10-CM

## 2021-10-18 MED ORDER — CYANOCOBALAMIN 1000 MCG/ML IJ SOLN: INTRAMUSCULAR | 3 refills | Status: DC

## 2021-10-18 NOTE — Assessment & Plan Note (Signed)
Worsened but relatively well controlled.  She does not wish to have referral to see a therapist at this time.  She will come back in soon for labs.

## 2021-10-18 NOTE — Assessment & Plan Note (Signed)
She will come back next week to check vitamin D.  She is taking 1000 IUs daily.

## 2021-10-18 NOTE — Telephone Encounter (Signed)
Spoke with patient stared visit. Patient has question for Dr Jimmey Ralph about husband  Let patient know we can not disclosed husband visit, verbalized understanding

## 2021-10-18 NOTE — Progress Notes (Signed)
   Patricia Underwood is a 70 y.o. female who presents today for a virtual office visit.  Assessment/Plan:  Chronic Problems Addressed Today: Situational mixed anxiety and depressive disorder Worsened but relatively well controlled.  She does not wish to have referral to see a therapist at this time.  She will come back in soon for labs.  B12 deficiency, Rx B12 injections Refill B12 injections.  She will come back soon to have B12 checked.  Vitamin D deficiency She will come back next week to check vitamin D.  She is taking 1000 IUs daily.    Subjective:  HPI:  See A/P for status of chronic conditions.       Objective/Observations  Physical Exam: Gen: NAD, resting comfortably Pulm: Normal work of breathing Neuro: Grossly normal, moves all extremities Psych: Normal affect and thought content  Virtual Visit via Video   I connected with Patricia Underwood on 10/18/21 at  4:00 PM EST by a video enabled telemedicine application and verified that I am speaking with the correct person using two identifiers. The limitations of evaluation and management by telemedicine and the availability of in person appointments were discussed. The patient expressed understanding and agreed to proceed.   Patient location: Home Provider location: [place] [employer] Persons participating in the virtual visit: Myself and Patient     Katina Degree. Jimmey Ralph, MD 10/18/2021 4:20 PM

## 2021-10-18 NOTE — Telephone Encounter (Signed)
Patient is requesting a call back from [name] in regard to her virtual visit at 4pm.  Would not disclose what the concern was.  Stated she only wanted to speak with Patricia Underwood before the appt time.

## 2021-10-18 NOTE — Assessment & Plan Note (Signed)
Refill B12 injections.  She will come back soon to have B12 checked.

## 2021-10-22 ENCOUNTER — Other Ambulatory Visit: Payer: Self-pay

## 2021-10-22 ENCOUNTER — Ambulatory Visit (INDEPENDENT_AMBULATORY_CARE_PROVIDER_SITE_OTHER): Payer: Medicare Other

## 2021-10-22 DIAGNOSIS — Z0000 Encounter for general adult medical examination without abnormal findings: Secondary | ICD-10-CM

## 2021-10-22 NOTE — Progress Notes (Signed)
Virtual Visit via Telephone Note  I connected with  Henryetta Corriveau on 10/22/21 at 10:15 AM EST by telephone and verified that I am speaking with the correct person using two identifiers.  Medicare Annual Wellness visit completed telephonically due to Covid-19 pandemic.   Persons participating in this call: This Health Coach and this patient.   Location: Patient: Home  Provider: office   I discussed the limitations, risks, security and privacy concerns of performing an evaluation and management service by telephone and the availability of in person appointments. The patient expressed understanding and agreed to proceed.  Unable to perform video visit due to video visit attempted and failed and/or patient does not have video capability.   Some vital signs may be absent or patient reported.   Willette Brace, LPN   Subjective:   Victoriah Wilds is a 70 y.o. female who presents for Medicare Annual (Subsequent) preventive examination.  Review of Systems           Objective:    Today's Vitals   10/22/21 1019  PainSc: 6    There is no height or weight on file to calculate BMI.  Advanced Directives 10/22/2021 11/22/2018  Does Patient Have a Medical Advance Directive? Yes No  Does patient want to make changes to medical advance directive? No - Patient declined -  Would patient like information on creating a medical advance directive? - No - Patient declined    Current Medications (verified) Outpatient Encounter Medications as of 10/22/2021  Medication Sig   atorvastatin (LIPITOR) 20 MG tablet Take by mouth.   cyanocobalamin (,VITAMIN B-12,) 1000 MCG/ML injection 1000 mcg (1 mg) injection once every other week   triazolam (HALCION) 0.25 MG tablet Take 1 tablet (0.25 mg total) by mouth at bedtime as needed for sleep.   [DISCONTINUED] albuterol (VENTOLIN HFA) 108 (90 Base) MCG/ACT inhaler INHALE 2 PUFFS INTO THE LUNGS EVERY 6 HOURS AS NEEDED FOR WHEEZING OR FOR SHORTNESS OF BREATH  (Patient not taking: Reported on 10/22/2021)   [DISCONTINUED] atorvastatin (LIPITOR) 20 MG tablet Take 1 tablet (20 mg total) by mouth at bedtime.   [DISCONTINUED] blood glucose meter kit and supplies KIT Dispense based on patient and insurance preference. Use up to four times daily as directed. (FOR ICD-9 250.00, 250.01).   [DISCONTINUED] Cholecalciferol 1.25 MG (50000 UT) TABS 50,000 units PO qwk for 12 weeks.   [DISCONTINUED] Cholecalciferol 25 MCG (1000 UT) tablet Take by mouth.   [DISCONTINUED] diazepam (VALIUM) 5 MG tablet Take 1 tablet (5 mg total) by mouth every 12 (twelve) hours as needed for anxiety. (Patient not taking: Reported on 10/22/2021)   [DISCONTINUED] donepezil (ARICEPT) 5 MG tablet Take by mouth.   [DISCONTINUED] donepezil (ARICEPT) 5 MG tablet Take 5 mg by mouth daily.   [DISCONTINUED] gabapentin (NEURONTIN) 100 MG capsule Take 1 capsule (100 mg total) by mouth 3 (three) times daily as needed. (Patient not taking: Reported on 10/22/2021)   [DISCONTINUED] glucose blood test strip Check glucose 1 time daily. E11.9   [DISCONTINUED] Minoxidil 5 % FOAM Apply daily. Massage into scalp and leave for at least 4 hours. (Patient not taking: Reported on 10/22/2021)   [DISCONTINUED] pilocarpine (SALAGEN) 5 MG tablet Take 5 mg by mouth. 3 times a week as needed for saliva   [DISCONTINUED] predniSONE (DELTASONE) 20 MG tablet Take 1 tablet (20 mg total) by mouth daily with breakfast. (Patient not taking: Reported on 10/22/2021)   [DISCONTINUED] SUMAtriptan (IMITREX) 50 MG tablet Take 1 tablet (50 mg total)  by mouth every 2 (two) hours as needed for migraine. May repeat in 2 hours if headache persists or recurs.   [DISCONTINUED] Syringe/Needle, Disp, (SYRINGE 3CC/25GX1") 25G X 1" 3 ML MISC 1 application by Does not apply route once a week.   [DISCONTINUED] triamcinolone ointment (KENALOG) 0.5 % Apply 1 application topically 2 (two) times daily.   No facility-administered encounter medications  on file as of 10/22/2021.    Allergies (verified) Ambien [zolpidem tartrate], Flexeril [cyclobenzaprine], and Naproxen   History: Past Medical History:  Diagnosis Date   Aortic atherosclerosis ([place])    B12 deficiency, Rx B12 injections 07/05/2017   Chronic back pain    Chronic right shoulder pain 05/30/2017   Coronary artery calcification seen on CAT scan    Esophageal spasm    Fibromyalgia    HLD (hyperlipidemia) 05/30/2017   Past Surgical History:  Procedure Laterality Date   ABDOMINAL HYSTERECTOMY     KNEE SURGERY Right    LUMBAR FUSION     L4-L5   SHOULDER ARTHROSCOPY Right    Family History  Problem Relation Age of Onset   CAD Mother        died of massive MI at 71   CVA Father        x 2   Hyperlipidemia Sister    Diabetes Neg Hx    Social History   Socioeconomic History   Marital status: Married    Spouse name: Not on file   Number of children: Not on file   Years of education: Not on file   Highest education level: Not on file  Occupational History   Occupation: Retired   Tobacco Use   Smoking status: Former   Smokeless tobacco: Never   Tobacco comments:    quit in [facility] 2021  Substance and Sexual Activity   Alcohol use: Yes    Alcohol/week: 7.0 standard drinks    Types: 7 Glasses of wine per week   Drug use: No   Sexual activity: Not Currently    Partners: Male    Birth control/protection: None  Other Topics Concern   Not on file  Social History Narrative   Not on file   Social Determinants of Health   Financial Resource Strain: Low Risk    Difficulty of Paying Living Expenses: Not hard at all  Food Insecurity: No Food Insecurity   Worried About Charity fundraiser in the Last Year: Never true   [place] in the Last Year: Never true  Transportation Needs: No Transportation Needs   Lack of Transportation (Medical): No   Lack of Transportation (Non-Medical): No  Physical Activity: Sufficiently Active   Days of Exercise per Week: 7  days   Minutes of Exercise per Session: 30 min  Stress: No Stress Concern Present   Feeling of Stress : Not at all  Social Connections: Moderately Isolated   Frequency of Communication with Friends and Family: More than three times a week   Frequency of Social Gatherings with Friends and Family: More than three times a week   Attends Religious Services: Never   Marine scientist or Organizations: No   Attends Archivist Meetings: Never   Marital Status: Married    Tobacco Counseling Counseling given: Not Answered Tobacco comments: quit in [facility] 2021   Clinical Intake:  Pre-visit preparation completed: Yes  Pain : 0-10 Pain Score: 6  Pain Type: Chronic pain Pain Location: Generalized Pain Descriptors / Indicators: Aching, Larence Penning,  Nagging Pain Onset: More than a month ago Pain Frequency: Constant     BMI - recorded: 19.99 Nutritional Status: BMI of 19-24  Normal Nutritional Risks: None Diabetes: No  How often do you need to have someone help you when you read instructions, pamphlets, or other written materials from your doctor or pharmacy?: 1 - Never  Diabetic?No  Interpreter Needed?: No  Information entered by :: Charlott Rakes, LPN   Activities of Daily Living In your present state of health, do you have any difficulty performing the following activities: 10/22/2021 03/04/2021  Hearing? Y N  Comment wears hearing aids -  Vision? N N  Difficulty concentrating or making decisions? N N  Walking or climbing stairs? N N  Dressing or bathing? N N  Doing errands, shopping? N N  Preparing Food and eating ? N -  Using the Toilet? N -  In the past six months, have you accidently leaked urine? N -  Do you have problems with loss of bowel control? N -  Managing your Medications? N -  Managing your Finances? N -  Housekeeping or managing your Housekeeping? N -  Some recent data might be hidden    Patient Care Team: Vivi Barrack, MD as PCP -  General (Family Medicine) Buford Dresser, MD as PCP - Cardiology (Cardiology) Margaretha Seeds, MD as Consulting Physician (Pulmonary Disease) Gerda Diss, DO as Consulting Physician (Sports Medicine)  Indicate any recent Medical Services you may have received from other than [facility] providers in the past year (date may be approximate).     Assessment:   This is a routine wellness examination for Tatijana.  Hearing/Vision screen Hearing Screening - Comments:: Wears hearing aids  Vision Screening - Comments:: Pt follows up with provider for annual eye exams   Dietary issues and exercise activities discussed:     Goals Addressed             This Visit's Progress    Patient Stated       Continue exercise        Depression Screen PHQ 2/9 Scores 10/22/2021 07/23/2021 03/04/2021 02/24/2020 08/24/2019 04/19/2019 04/19/2019  PHQ - 2 Score 0 0 0 5 0 0 2  PHQ- 9 Score - - - 13 5 0 13    Fall Risk Fall Risk  10/22/2021 07/23/2021 02/14/2021 02/14/2021 02/14/2021  Falls in the past year? 0 0 1 1 0  Number falls in past yr: 0 0 0 - -  Injury with Fall? 0 0 1 - -  Risk for fall due to : Impaired vision No Fall Risks - - -  Follow up Falls prevention discussed - - - -    FALL RISK PREVENTION PERTAINING TO THE HOME:  Any stairs in or around the home? Yes  If so, are there any without handrails? No  Home free of loose throw rugs in walkways, pet beds, electrical cords, etc? Yes  Adequate lighting in your home to reduce risk of falls? Yes   ASSISTIVE DEVICES UTILIZED TO PREVENT FALLS:  Life alert? No  Use of a cane, walker or w/c? No  Grab bars in the bathroom? No  Shower chair or bench in shower? No  Elevated toilet seat or a handicapped toilet? No   TIMED UP AND GO:  Was the test performed? No .  Cognitive Function: Declined at this time MMSE - Mini Mental State Exam 09/11/2018  Orientation to time 5  Orientation to Place 5  Registration  3  Attention/ Calculation 5   Recall 3  Language- name 2 objects 2  Language- repeat 1  Language- follow 3 step command 3  Language- read & follow direction 1  Write a sentence 1  Copy design 1  Total score 30        Immunizations Immunization History  Administered Date(s) Administered   Influenza-Unspecified 11/10/2018   PFIZER Comirnaty(Gray Top)Covid-19 Tri-Sucrose Vaccine 12/13/2019, 01/03/2020   PFIZER(Purple Top)SARS-COV-2 Vaccination 12/13/2019, 01/03/2020, 08/07/2020, 05/30/2021, 09/06/2021   Tdap 05/31/2013    TDAP status: Up to date  Flu Vaccine status: Declined, Education has been provided regarding the importance of this vaccine but patient still declined. Advised may receive this vaccine at local pharmacy or Health Dept. Aware to provide a copy of the vaccination record if obtained from local pharmacy or Health Dept. Verbalized acceptance and understanding.  Pneumococcal vaccine status: Up to date  Covid-19 vaccine status: Completed vaccines  Qualifies for Shingles Vaccine? Yes   Zostavax completed No   Shingrix Completed?: No.    Education has been provided regarding the importance of this vaccine. Patient has been advised to call insurance company to determine out of pocket expense if they have not yet received this vaccine. Advised may also receive vaccine at local pharmacy or Health Dept. Verbalized acceptance and understanding.  Screening Tests Health Maintenance  Topic Date Due   Pneumonia Vaccine 70+ Years old (1 - PCV) Never done   Zoster Vaccines- Shingrix (1 of 2) Never done   MAMMOGRAM  03/28/2022 (Originally 11/05/2020)   COVID-19 Vaccine (6 - Booster for Pfizer series) 11/01/2021   TETANUS/TDAP  06/01/2023   DEXA SCAN  Completed   HPV VACCINES  Aged Out   INFLUENZA VACCINE  Discontinued   COLONOSCOPY (Pts 45-86yr Insurance coverage will need to be confirmed)  Discontinued    Health Maintenance  Health Maintenance Due  Topic Date Due   Pneumonia Vaccine 70 Years old  (1 - PCV) Never done   Zoster Vaccines- Shingrix (1 of 2) Never done    Colorectal cancer screening: No longer required.   Mammogram status: Completed 11/05/18. Repeat every year  Bone Density status: Completed 10/15/18. Results reflect: Bone density results: OSTEOPENIA. Repeat every 5 years.  Additional Screening:  Hepatitis C Screening: does not qualify  Vision Screening: Recommended annual ophthalmology exams for early detection of glaucoma and other disorders of the eye. Is the patient up to date with their annual eye exam?  Yes  Who is the provider or what is the name of the office in which the patient attends annual eye exams? Unsure of provider name If pt is not established with a provider, would they like to be referred to a provider to establish care? No .   Dental Screening: Recommended annual dental exams for proper oral hygiene  Community Resource Referral / Chronic Care Management: CRR required this visit?  No   CCM required this visit?  No      Plan:     I have personally reviewed and noted the following in the patients chart:   Medical and social history Use of alcohol, tobacco or illicit drugs  Current medications and supplements including opioid prescriptions.  Functional ability and status Nutritional status Physical activity Advanced directives List of other physicians Hospitalizations, surgeries, and ER visits in previous 12 months Vitals Screenings to include cognitive, depression, and falls Referrals and appointments  In addition, I have reviewed and discussed with patient certain preventive protocols, quality metrics, and best practice  recommendations. A written personalized care plan for preventive services as well as general preventive health recommendations were provided to patient.     Willette Brace, LPN   [id number]   Nurse Notes: none

## 2021-10-22 NOTE — Patient Instructions (Signed)
[name] , Thank you for taking time to come for your Medicare Wellness Visit. I appreciate your ongoing commitment to your health goals. Please review the following plan we discussed and let me know if I can assist you in the future.   Screening recommendations/referrals: Colonoscopy: No longer required  Mammogram: Done 11/05/18 pt will schedule Bone Density: Done 10/15/18 repeat every 2 years  Recommended yearly ophthalmology/optometry visit for glaucoma screening and checkup Recommended yearly dental visit for hygiene and checkup  Vaccinations: Influenza vaccine: Declined  Pneumococcal vaccine: Up to date per pt Tdap vaccine: Done 05/31/13 repeat every 10 years  Shingles vaccine: Shingrix discussed. Please contact your pharmacy for coverage information.    Covid-19:Completed 2/2, 2/23, 08/07/20 & 7/21, 09/06/21  Advanced directives: Advance directive discussed with you today. Even though you declined this today please call our office should you change your mind and we can give you the proper paperwork for you to fill out.  Conditions/risks identified: Continue exercsie  Next appointment: Follow up in one year for your annual wellness visit    Preventive Care 65 Years and Older, Female Preventive care refers to lifestyle choices and visits with your health care provider that can promote health and wellness. What does preventive care include? A yearly physical exam. This is also called an annual well check. Dental exams once or twice a year. Routine eye exams. Ask your health care provider how often you should have your eyes checked. Personal lifestyle choices, including: Daily care of your teeth and gums. Regular physical activity. Eating a healthy diet. Avoiding tobacco and drug use. Limiting alcohol use. Practicing safe sex. Taking low-dose aspirin every day. Taking vitamin and mineral supplements as recommended by your health care provider. What happens during an annual well  check? The services and screenings done by your health care provider during your annual well check will depend on your age, overall health, lifestyle risk factors, and family history of disease. Counseling  Your health care provider may ask you questions about your: Alcohol use. Tobacco use. Drug use. Emotional well-being. Home and relationship well-being. Sexual activity. Eating habits. History of falls. Memory and ability to understand (cognition). Work and work Astronomer. Reproductive health. Screening  You may have the following tests or measurements: Height, weight, and BMI. Blood pressure. Lipid and cholesterol levels. These may be checked every 5 years, or more frequently if you are over 4 years old. Skin check. Lung cancer screening. You may have this screening every year starting at age 78 if you have a 30-pack-year history of smoking and currently smoke or have quit within the past 15 years. Fecal occult blood test (FOBT) of the stool. You may have this test every year starting at age 66. Flexible sigmoidoscopy or colonoscopy. You may have a sigmoidoscopy every 5 years or a colonoscopy every 10 years starting at age 62. Hepatitis C blood test. Hepatitis B blood test. Sexually transmitted disease (STD) testing. Diabetes screening. This is done by checking your blood sugar (glucose) after you have not eaten for a while (fasting). You may have this done every 1-3 years. Bone density scan. This is done to screen for osteoporosis. You may have this done starting at age 73. Mammogram. This may be done every 1-2 years. Talk to your health care provider about how often you should have regular mammograms. Talk with your health care provider about your test results, treatment options, and if necessary, the need for more tests. Vaccines  Your health care provider may  recommend certain vaccines, such as: Influenza vaccine. This is recommended every year. Tetanus, diphtheria, and  acellular pertussis (Tdap, Td) vaccine. You may need a Td booster every 10 years. Zoster vaccine. You may need this after age 53. Pneumococcal 13-valent conjugate (PCV13) vaccine. One dose is recommended after age 5. Pneumococcal polysaccharide (PPSV23) vaccine. One dose is recommended after age 35. Talk to your health care provider about which screenings and vaccines you need and how often you need them. This information is not intended to replace advice given to you by your health care provider. Make sure you discuss any questions you have with your health care provider. Document Released: 11/23/2015 Document Revised: 07/16/2016 Document Reviewed: 08/28/2015 Elsevier Interactive Patient Education  2017 [place] Prevention in the Home Falls can cause injuries. They can happen to people of all ages. There are many things you can do to make your home safe and to help prevent falls. What can I do on the outside of my home? Regularly fix the edges of walkways and driveways and fix any cracks. Remove anything that might make you trip as you walk through a door, such as a raised step or threshold. Trim any bushes or trees on the path to your home. Use bright outdoor lighting. Clear any walking paths of anything that might make someone trip, such as rocks or tools. Regularly check to see if handrails are loose or broken. Make sure that both sides of any steps have handrails. Any raised decks and porches should have guardrails on the edges. Have any leaves, snow, or ice cleared regularly. Use sand or salt on walking paths during winter. Clean up any spills in your garage right away. This includes oil or grease spills. What can I do in the bathroom? Use night lights. Install grab bars by the toilet and in the tub and shower. Do not use towel bars as grab bars. Use non-skid mats or decals in the tub or shower. If you need to sit down in the shower, use a plastic, non-slip stool. Keep  the floor dry. Clean up any water that spills on the floor as soon as it happens. Remove soap buildup in the tub or shower regularly. Attach bath mats securely with double-sided non-slip rug tape. Do not have throw rugs and other things on the floor that can make you trip. What can I do in the bedroom? Use night lights. Make sure that you have a light by your bed that is easy to reach. Do not use any sheets or blankets that are too big for your bed. They should not hang down onto the floor. Have a firm chair that has side arms. You can use this for support while you get dressed. Do not have throw rugs and other things on the floor that can make you trip. What can I do in the kitchen? Clean up any spills right away. Avoid walking on wet floors. Keep items that you use a lot in easy-to-reach places. If you need to reach something above you, use a strong step stool that has a grab bar. Keep electrical cords out of the way. Do not use floor polish or wax that makes floors slippery. If you must use wax, use non-skid floor wax. Do not have throw rugs and other things on the floor that can make you trip. What can I do with my stairs? Do not leave any items on the stairs. Make sure that there are handrails on both sides of  the stairs and use them. Fix handrails that are broken or loose. Make sure that handrails are as long as the stairways. Check any carpeting to make sure that it is firmly attached to the stairs. Fix any carpet that is loose or worn. Avoid having throw rugs at the top or bottom of the stairs. If you do have throw rugs, attach them to the floor with carpet tape. Make sure that you have a light switch at the top of the stairs and the bottom of the stairs. If you do not have them, ask someone to add them for you. What else can I do to help prevent falls? Wear shoes that: Do not have high heels. Have rubber bottoms. Are comfortable and fit you well. Are closed at the toe. Do not  wear sandals. If you use a stepladder: Make sure that it is fully opened. Do not climb a closed stepladder. Make sure that both sides of the stepladder are locked into place. Ask someone to hold it for you, if possible. Clearly mark and make sure that you can see: Any grab bars or handrails. First and last steps. Where the edge of each step is. Use tools that help you move around (mobility aids) if they are needed. These include: Canes. Walkers. Scooters. Crutches. Turn on the lights when you go into a dark area. Replace any light bulbs as soon as they burn out. Set up your furniture so you have a clear path. Avoid moving your furniture around. If any of your floors are uneven, fix them. If there are any pets around you, be aware of where they are. Review your medicines with your doctor. Some medicines can make you feel dizzy. This can increase your chance of falling. Ask your doctor what other things that you can do to help prevent falls. This information is not intended to replace advice given to you by your health care provider. Make sure you discuss any questions you have with your health care provider. Document Released: 08/23/2009 Document Revised: 04/03/2016 Document Reviewed: 12/01/2014 Elsevier Interactive Patient Education  2017 [employer].

## 2021-10-27 DIAGNOSIS — G8929 Other chronic pain: Secondary | ICD-10-CM | POA: Diagnosis not present

## 2021-10-27 DIAGNOSIS — Z681 Body mass index (BMI) 19 or less, adult: Secondary | ICD-10-CM | POA: Diagnosis not present

## 2021-10-27 DIAGNOSIS — M545 Low back pain, unspecified: Secondary | ICD-10-CM | POA: Diagnosis not present

## 2021-10-27 DIAGNOSIS — R030 Elevated blood-pressure reading, without diagnosis of hypertension: Secondary | ICD-10-CM | POA: Diagnosis not present

## 2021-10-27 DIAGNOSIS — Z79899 Other long term (current) drug therapy: Secondary | ICD-10-CM | POA: Diagnosis not present

## 2021-10-30 DIAGNOSIS — Z79899 Other long term (current) drug therapy: Secondary | ICD-10-CM | POA: Diagnosis not present

## 2021-10-30 NOTE — Progress Notes (Signed)
I, [name], LAT, ATC, am serving as scribe for Dr. Clementeen Graham.  Patricia Underwood is a 70 y.o. female who presents to [facility] today for L hand trigger finger.  She was last seen by Dr. Denyse Amass 09/16/21 for L arm pain.  Today, pt reports L hand/finger pain and trigger fingers in her L thumb and L middle finger.  She also reports radiating pain down her left arm and a sensation of cramping in her L arm and L side of her spine that wake her at night.  The left arm pain has been ongoing since she was seen in clinic on November 7 and has not improved despite home exercise program taught at that time.  Pertinent review of systems: No fevers or chills  Relevant historical information: Rheumatoid arthritis   Exam:  BP 120/70 (BP Location: Right Arm, Patient Position: Sitting, Cuff Size: Normal)    Pulse 69    Ht 5\' 3"  (1.6 m)    Wt 114 lb (51.7 kg)    SpO2 98%    BMI 20.19 kg/m  General: Well Developed, well nourished, and in no acute distress.   MSK: C-spine: Normal-appearing Nontender midline. Decreased cervical range of motion. Upper summary strength is intact. Reflexes are intact. Positive Spurling's test left side.  Left hand swelling of MCP and PIP joints. Triggering present with flexion of thumb IP joint and third digit PIP joint. Tender palpation first and third MCP palmar aspect.    Lab and Radiology Results  Procedure: Real-time Ultrasound Guided Injection of left hand first MCP A1 pulley tendon sheath (trigger thumb injection) Device: Philips Affiniti 50G Images permanently stored and available for review in PACS Verbal informed consent obtained.  Discussed risks and benefits of procedure. Warned about infection bleeding damage to structures skin hypopigmentation and fat atrophy among others. Patient expresses understanding and agreement Time-out conducted.   Noted no overlying erythema, induration, or other signs of local infection.   Skin  prepped in a sterile fashion.   Local anesthesia: Topical Ethyl chloride.   With sterile technique and under real time ultrasound guidance: 40 mg of Kenalog and 1 mL of lidocaine injected into tendon sheath at A1 pulley. Fluid seen entering the tendon sheath.   Completed without difficulty   Pain immediately resolved suggesting accurate placement of the medication.   Advised to call if fevers/chills, erythema, induration, drainage, or persistent bleeding.   Images permanently stored and available for review in the ultrasound unit.  Impression: Technically successful ultrasound guided injection.    Procedure: Real-time Ultrasound Guided Injection of left hand third MCP A1 pulley (trigger finger injection) Device: Philips Affiniti 50G Images permanently stored and available for review in PACS Verbal informed consent obtained.  Discussed risks and benefits of procedure. Warned about infection bleeding damage to structures skin hypopigmentation and fat atrophy among others. Patient expresses understanding and agreement Time-out conducted.   Noted no overlying erythema, induration, or other signs of local infection.   Skin prepped in a sterile fashion.   Local anesthesia: Topical Ethyl chloride.   With sterile technique and under real time ultrasound guidance: 40 mg of Kenalog and 1 mL of lidocaine injected into tendon sheath. Fluid seen entering the tendon sheath.   Completed without difficulty   Pain immediately resolved suggesting accurate placement of the medication.   Advised to call if fevers/chills, erythema, induration, drainage, or persistent bleeding.   Images permanently stored and available for review in the ultrasound unit.  Impression: Technically successful ultrasound guided injection.    EXAM: CERVICAL SPINE - 2-3 VIEW   COMPARISON:  None.   FINDINGS: No fracture or static subluxation of the cervical spine moderate multilevel disc space height loss and osteophytosis,  worst from C4 through C7. The partially imaged skull base cervical soft tissues, and upper chest are unremarkable.   IMPRESSION: 1.  No fracture or static subluxation of the cervical spine.   2. Moderate multilevel disc space height loss and osteophytosis, worst from C4 through C7. Cervical disc and neural foraminal pathology may be further evaluated by MRI if indicated by neurologically localizing signs and symptoms.     Electronically Signed   By: Jearld Lesch M.D.   On: 09/17/2021 14:14    I, Clementeen Graham, personally (independently) visualized and performed the interpretation of the images attached in this note.      Assessment and Plan: 70 y.o. female with left hand trigger finger at thumb and third digit.  She has had similar episodes in the past.  Plan for injection today and splinting if possible.  Additionally she has left arm cervical radiculopathy and a C7 dermatomal pattern very likely.  There may be some C6 component as well.  She certainly has potential for impingement at these level based on her x-ray obtained on November 7.  She has not improved despite conservative management attempts over the last greater than 6 weeks.  Plan for MRI C-spine to further evaluate source of pain and likely for an epidural steroid injection planning. Likely proceed directly to epidural steroid injection following MRI.   PDMP not reviewed this encounter. Orders Placed This Encounter  Procedures   [place] LIMITED JOINT SPACE STRUCTURES UP LEFT(NO LINKED CHARGES)    Order Specific Question:   Reason for Exam (SYMPTOM  OR DIAGNOSIS REQUIRED)    Answer:   L thumb trigger finger    Order Specific Question:   Preferred imaging location?    Answer:   [facility]   MR Cervical Spine Wo Contrast    C-spine MRI to evaluate cervical radiculopathy into L UE    Standing Status:   Future    Standing Expiration Date:   10/31/2022    Order Specific Question:   What is the  patient's sedation requirement?    Answer:   No Sedation    Order Specific Question:   Does the patient have a pacemaker or implanted devices?    Answer:   No    Order Specific Question:   Preferred imaging location?    Answer:   [facility] (table limit-550lbs)   No orders of the defined types were placed in this encounter.    Discussed warning signs or symptoms. Please see discharge instructions. Patient expresses understanding.   The above documentation has been reviewed and is accurate and complete Clementeen Graham, M.D.

## 2021-10-31 ENCOUNTER — Ambulatory Visit: Payer: Self-pay

## 2021-10-31 ENCOUNTER — Encounter: Payer: Self-pay | Admitting: Family Medicine

## 2021-10-31 ENCOUNTER — Ambulatory Visit (INDEPENDENT_AMBULATORY_CARE_PROVIDER_SITE_OTHER): Payer: Medicare Other | Admitting: Family Medicine

## 2021-10-31 ENCOUNTER — Other Ambulatory Visit: Payer: Self-pay

## 2021-10-31 VITALS — BP 120/70 | HR 69 | Ht 63.0 in | Wt 114.0 lb

## 2021-10-31 DIAGNOSIS — M542 Cervicalgia: Secondary | ICD-10-CM | POA: Diagnosis not present

## 2021-10-31 DIAGNOSIS — M65332 Trigger finger, left middle finger: Secondary | ICD-10-CM | POA: Diagnosis not present

## 2021-10-31 DIAGNOSIS — M5412 Radiculopathy, cervical region: Secondary | ICD-10-CM

## 2021-10-31 DIAGNOSIS — I2510 Atherosclerotic heart disease of native coronary artery without angina pectoris: Secondary | ICD-10-CM

## 2021-10-31 DIAGNOSIS — I2584 Coronary atherosclerosis due to calcified coronary lesion: Secondary | ICD-10-CM | POA: Diagnosis not present

## 2021-10-31 DIAGNOSIS — M65312 Trigger thumb, left thumb: Secondary | ICD-10-CM | POA: Diagnosis not present

## 2021-10-31 NOTE — Patient Instructions (Addendum)
Nice to see you.  You had a L thumb and middle finger trigger finger injection.  Call or go to the ER if you develop a large red swollen joint with extreme pain or oozing puss.   I've ordered a cervical MRI to [facility].  They will call you to schedule but if you haven't heard from them by the end of next week, you can call [phone] to schedule.  Follow-up: as needed  Happy Holidays!

## 2021-11-16 ENCOUNTER — Other Ambulatory Visit: Payer: Self-pay | Admitting: Family Medicine

## 2021-11-18 ENCOUNTER — Other Ambulatory Visit: Payer: Self-pay | Admitting: *Deleted

## 2021-11-18 MED ORDER — TRIAZOLAM 0.25 MG PO TABS: 0.2500 mg | ORAL_TABLET | Freq: Every evening | ORAL | 0 refills | Status: DC | PRN

## 2021-12-18 DIAGNOSIS — Z9889 Other specified postprocedural states: Secondary | ICD-10-CM | POA: Diagnosis not present

## 2021-12-18 DIAGNOSIS — M5412 Radiculopathy, cervical region: Secondary | ICD-10-CM | POA: Diagnosis not present

## 2021-12-25 DIAGNOSIS — Z79899 Other long term (current) drug therapy: Secondary | ICD-10-CM | POA: Diagnosis not present

## 2021-12-25 DIAGNOSIS — M545 Low back pain, unspecified: Secondary | ICD-10-CM | POA: Diagnosis not present

## 2021-12-25 DIAGNOSIS — Z6820 Body mass index (BMI) 20.0-20.9, adult: Secondary | ICD-10-CM | POA: Diagnosis not present

## 2021-12-25 DIAGNOSIS — G8929 Other chronic pain: Secondary | ICD-10-CM | POA: Diagnosis not present

## 2021-12-25 DIAGNOSIS — R030 Elevated blood-pressure reading, without diagnosis of hypertension: Secondary | ICD-10-CM | POA: Diagnosis not present

## 2021-12-25 DIAGNOSIS — Z681 Body mass index (BMI) 19 or less, adult: Secondary | ICD-10-CM | POA: Diagnosis not present

## 2021-12-31 ENCOUNTER — Other Ambulatory Visit: Payer: Self-pay

## 2021-12-31 ENCOUNTER — Ambulatory Visit (INDEPENDENT_AMBULATORY_CARE_PROVIDER_SITE_OTHER): Payer: Medicare Other | Admitting: Family Medicine

## 2021-12-31 ENCOUNTER — Encounter: Payer: Self-pay | Admitting: Family Medicine

## 2021-12-31 ENCOUNTER — Other Ambulatory Visit: Payer: Self-pay | Admitting: Pain Medicine

## 2021-12-31 VITALS — BP 128/86 | HR 82 | Temp 97.7°F | Ht 63.0 in | Wt 106.2 lb

## 2021-12-31 DIAGNOSIS — R739 Hyperglycemia, unspecified: Secondary | ICD-10-CM | POA: Diagnosis not present

## 2021-12-31 DIAGNOSIS — E7800 Pure hypercholesterolemia, unspecified: Secondary | ICD-10-CM

## 2021-12-31 DIAGNOSIS — E538 Deficiency of other specified B group vitamins: Secondary | ICD-10-CM

## 2021-12-31 DIAGNOSIS — M5412 Radiculopathy, cervical region: Secondary | ICD-10-CM | POA: Diagnosis not present

## 2021-12-31 DIAGNOSIS — F419 Anxiety disorder, unspecified: Secondary | ICD-10-CM

## 2021-12-31 DIAGNOSIS — E559 Vitamin D deficiency, unspecified: Secondary | ICD-10-CM

## 2021-12-31 DIAGNOSIS — M069 Rheumatoid arthritis, unspecified: Secondary | ICD-10-CM | POA: Diagnosis not present

## 2021-12-31 DIAGNOSIS — R5383 Other fatigue: Secondary | ICD-10-CM

## 2021-12-31 DIAGNOSIS — F5102 Adjustment insomnia: Secondary | ICD-10-CM | POA: Diagnosis not present

## 2021-12-31 DIAGNOSIS — R7302 Impaired glucose tolerance (oral): Secondary | ICD-10-CM | POA: Diagnosis not present

## 2021-12-31 MED ORDER — PREDNISONE 20 MG PO TABS: 20.0000 mg | ORAL_TABLET | Freq: Every day | ORAL | 0 refills | Status: DC

## 2021-12-31 NOTE — Assessment & Plan Note (Signed)
Check B12.  Last injection was about a month ago.

## 2021-12-31 NOTE — Assessment & Plan Note (Signed)
Check vitamin D. 

## 2021-12-31 NOTE — Assessment & Plan Note (Signed)
Mild flare recently.  She has upcoming trip to [facility] that she is also worried about.  We will start prednisone burst 20 mg daily for the next 14 days.  She did well with this a few months ago.  We will defer further management to rheumatology and sports medicine.

## 2021-12-31 NOTE — Assessment & Plan Note (Signed)
Check A1c. 

## 2021-12-31 NOTE — Progress Notes (Signed)
° °  Patricia Underwood is a 71 y.o. female who presents today for an office visit.  Assessment/Plan:  New/Acute Problems: Other Fatigue Multifactorial.  We will check requested labs.  May benefit from switching calcium to alternative plan we will wait on labs first.  Chronic Problems Addressed Today: Cervical radiculopathy Continue management per neurosurgery and sports medicine.  She will be having an MRI done soon however this needs to be scheduled.  IGT (impaired glucose tolerance) Check A1c.  Insomnia Not controlled on Halcion.  We discussed switching to gabapentin or Lyrica however she would like to defer for now until her labs come back.  B12 deficiency, Rx B12 injections Check B12.  Last injection was about a month ago.  Vitamin D deficiency Check vitamin D.  HLD (hyperlipidemia) She is on Lipitor 20 mg daily.  Check lipids.  Rheumatoid arthritis (HCC) Mild flare recently.  She has upcoming trip to [place] that she is also worried about.  We will start prednisone burst 20 mg daily for the next 14 days.  She did well with this a few months ago.  We will defer further management to rheumatology and sports medicine.     Subjective:  HPI:  See A/P for status of chronic conditions.  She has had worsening fatigue for the last couple of months.  She would like to have blood work done to check her vitamin levels.  No recent changes in medications.  She has seen sports medicine and neurosurgery since her last visit.  She has still has ongoing issues with cervical radiculopathy.  She is also having ongoing issues with rheumatoid arthritis.  She has an upcoming trip to [facility] and is concerned about pain management during this time.  She has had ongoing issues with sleep as well.  Halcion is not particularly effective however she would like to continue with current prescription for now.        Objective:  Physical Exam: BP 128/86 (BP Location: Right Arm)    Pulse 82    Temp 97.7 F  (36.5 C) (Temporal)    Ht 5\' 3"  (1.6 m)    Wt 106 lb 3.2 oz (48.2 kg)    SpO2 97%    BMI 18.81 kg/m   Gen: No acute distress, resting comfortably Neuro: Grossly normal, moves all extremities Psych: Normal affect and thought content       M. Jimmey Ralph, MD 12/31/2021 11:01 AM

## 2021-12-31 NOTE — Patient Instructions (Signed)
It was very nice to see you today!  Please start the prednisone prior to your trip.  Please come back later this week to have blood work done.  Let me know if you would like to change your sleep medication.  Take care, Dr Jimmey Ralph  PLEASE NOTE:  If you had any lab tests please let us know if you have not heard back within a few days. You may see your results on mychart before we have a chance to review them but we will give you a call once they are reviewed by [place]. If we ordered any referrals today, please let us know if you have not heard from their office within the next week.   Please try these tips to maintain a healthy lifestyle:  Eat at least 3 REAL meals and 1-2 snacks per day.  Aim for no more than 5 hours between eating.  If you eat breakfast, please do so within one hour of getting up.   Each meal should contain half fruits/vegetables, one quarter protein, and one quarter carbs (no bigger than a computer mouse)  Cut down on sweet beverages. This includes juice, soda, and sweet tea.   Drink at least 1 glass of water with each meal and aim for at least 8 glasses per day  Exercise at least 150 minutes every week.

## 2021-12-31 NOTE — Assessment & Plan Note (Signed)
Not controlled on Halcion.  We discussed switching to gabapentin or Lyrica however she would like to defer for now until her labs come back.

## 2021-12-31 NOTE — Assessment & Plan Note (Signed)
Continue management per neurosurgery and sports medicine.  She will be having an MRI done soon however this needs to be scheduled.

## 2021-12-31 NOTE — Assessment & Plan Note (Signed)
She is on Lipitor 20 mg daily.  Check lipids.

## 2022-01-01 ENCOUNTER — Other Ambulatory Visit: Payer: Self-pay | Admitting: Pain Medicine

## 2022-01-01 DIAGNOSIS — M5412 Radiculopathy, cervical region: Secondary | ICD-10-CM

## 2022-01-02 ENCOUNTER — Other Ambulatory Visit: Payer: Self-pay

## 2022-01-02 ENCOUNTER — Other Ambulatory Visit: Payer: Medicare Other

## 2022-01-02 DIAGNOSIS — R739 Hyperglycemia, unspecified: Secondary | ICD-10-CM

## 2022-01-02 DIAGNOSIS — E7800 Pure hypercholesterolemia, unspecified: Secondary | ICD-10-CM

## 2022-01-02 DIAGNOSIS — E559 Vitamin D deficiency, unspecified: Secondary | ICD-10-CM

## 2022-01-02 DIAGNOSIS — E538 Deficiency of other specified B group vitamins: Secondary | ICD-10-CM

## 2022-01-02 DIAGNOSIS — R5383 Other fatigue: Secondary | ICD-10-CM

## 2022-01-06 ENCOUNTER — Telehealth: Payer: Self-pay

## 2022-01-06 NOTE — Telephone Encounter (Signed)
Patient states she has lost script of prednisone.  Is requesting another script to be [facility].

## 2022-01-07 ENCOUNTER — Telehealth: Payer: Self-pay | Admitting: Family Medicine

## 2022-01-07 ENCOUNTER — Telehealth (INDEPENDENT_AMBULATORY_CARE_PROVIDER_SITE_OTHER): Payer: Medicare Other | Admitting: Family Medicine

## 2022-01-07 ENCOUNTER — Other Ambulatory Visit: Payer: Self-pay

## 2022-01-07 ENCOUNTER — Other Ambulatory Visit: Payer: Self-pay | Admitting: *Deleted

## 2022-01-07 ENCOUNTER — Encounter: Payer: Self-pay | Admitting: Family Medicine

## 2022-01-07 VITALS — Temp 97.5°F | Ht 63.0 in | Wt 106.0 lb

## 2022-01-07 DIAGNOSIS — F5102 Adjustment insomnia: Secondary | ICD-10-CM | POA: Diagnosis not present

## 2022-01-07 DIAGNOSIS — I2510 Atherosclerotic heart disease of native coronary artery without angina pectoris: Secondary | ICD-10-CM | POA: Diagnosis not present

## 2022-01-07 DIAGNOSIS — E7800 Pure hypercholesterolemia, unspecified: Secondary | ICD-10-CM

## 2022-01-07 DIAGNOSIS — I2584 Coronary atherosclerosis due to calcified coronary lesion: Secondary | ICD-10-CM | POA: Diagnosis not present

## 2022-01-07 DIAGNOSIS — U071 COVID-19: Secondary | ICD-10-CM | POA: Diagnosis not present

## 2022-01-07 MED ORDER — BENZONATATE 200 MG PO CAPS: 200.0000 mg | ORAL_CAPSULE | Freq: Two times a day (BID) | ORAL | 0 refills | Status: DC | PRN

## 2022-01-07 MED ORDER — NIRMATRELVIR/RITONAVIR (PAXLOVID)TABLET
3.0000 | ORAL_TABLET | Freq: Two times a day (BID) | ORAL | 0 refills | Status: AC
Start: 2022-01-07 — End: 2022-01-12

## 2022-01-07 MED ORDER — PREDNISONE 20 MG PO TABS: 20.0000 mg | ORAL_TABLET | Freq: Every day | ORAL | 0 refills | Status: AC

## 2022-01-07 NOTE — Telephone Encounter (Signed)
Contacted pharmacy to advise GFR for pt.

## 2022-01-07 NOTE — Telephone Encounter (Signed)
[facility] called reqquesting EGFR for medication PAXLOVID. Please call [phone].

## 2022-01-07 NOTE — Progress Notes (Signed)
° °  Patricia Underwood is a 71 y.o. female who presents today for a virtual office visit.  Assessment/Plan:  New/Acute Problems: COVID No red flags.  She is at increased risk for complication from COVID due to her age and comorbidities.  We discussed starting molnupiravir due to medication interactions however she prefers paxlovid as this is what her sister was prescribed.  We will start paxlovid-she has been off of Lipitor for the last few days.  Also instructed patient to avoid her triazolam while on Paxlovid.  Also start Tessalon for cough.  Encouraged hydration.  Discussed reasons to return to care.  Follow-up as needed.  Chronic Problems Addressed Today: CAD -on statin.  Increases risk for complication from COVID HLD -she is holding statin while on paxlovid as above. Insomnia - Hold Halcion while on paxlovid.     Subjective:  HPI:  Patient here with covid. Her symptoms started about 5 days ago. Symptoms include sore throat, chest congestion, and malaise. Her covid test this morning was positive.  Husband has been sick with similar symptoms.  No reported chest pain or shortness of breath.  Sister has also been sick with similar symptoms and had a COVID positive test yesterday.       Objective/Observations  Physical Exam: Gen: NAD, resting comfortably Pulm: Normal work of breathing Neuro: Grossly normal, moves all extremities Psych: Normal affect and thought content  Virtual Visit via Video   I connected with Thomasenia Delashmutt on 01/07/22 at 11:40 AM EST by a video enabled telemedicine application and verified that I am speaking with the correct person using two identifiers. The limitations of evaluation and management by telemedicine and the availability of in person appointments were discussed. The patient expressed understanding and agreed to proceed.   Patient location: Home Provider location: [place] [facility] Persons participating in the virtual visit: Myself and Patient      Katina Degree. Jimmey Ralph, MD 01/07/2022 12:18 PM

## 2022-01-07 NOTE — Telephone Encounter (Signed)
Please advise 

## 2022-01-07 NOTE — Telephone Encounter (Signed)
Rx send to pharmacy  

## 2022-01-07 NOTE — Telephone Encounter (Signed)
Ok to refill prednisone.  Patricia Underwood. Jimmey Ralph, MD 01/07/2022 8:36 AM

## 2022-01-11 DIAGNOSIS — Z20822 Contact with and (suspected) exposure to covid-19: Secondary | ICD-10-CM | POA: Diagnosis not present

## 2022-01-27 ENCOUNTER — Other Ambulatory Visit (INDEPENDENT_AMBULATORY_CARE_PROVIDER_SITE_OTHER): Payer: Medicare Other

## 2022-01-27 DIAGNOSIS — E538 Deficiency of other specified B group vitamins: Secondary | ICD-10-CM

## 2022-01-27 DIAGNOSIS — E559 Vitamin D deficiency, unspecified: Secondary | ICD-10-CM | POA: Diagnosis not present

## 2022-01-27 DIAGNOSIS — E7800 Pure hypercholesterolemia, unspecified: Secondary | ICD-10-CM

## 2022-01-27 DIAGNOSIS — R5383 Other fatigue: Secondary | ICD-10-CM

## 2022-01-27 DIAGNOSIS — R739 Hyperglycemia, unspecified: Secondary | ICD-10-CM | POA: Diagnosis not present

## 2022-01-27 LAB — CBC WITH DIFFERENTIAL/PLATELET
Basophils Absolute: 0 10*3/uL (ref 0.0–0.1)
Basophils Relative: 0.7 % (ref 0.0–3.0)
Eosinophils Absolute: 0.2 10*3/uL (ref 0.0–0.7)
Eosinophils Relative: 2.9 % (ref 0.0–5.0)
HCT: 39.8 % (ref 36.0–46.0)
Hemoglobin: 13.4 g/dL (ref 12.0–15.0)
Lymphocytes Relative: 50.4 % — ABNORMAL HIGH (ref 12.0–46.0)
Lymphs Abs: 3.2 10*3/uL (ref 0.7–4.0)
MCHC: 33.6 g/dL (ref 30.0–36.0)
MCV: 95.6 fl (ref 78.0–100.0)
Monocytes Absolute: 0.5 10*3/uL (ref 0.1–1.0)
Monocytes Relative: 7.8 % (ref 3.0–12.0)
Neutro Abs: 2.4 10*3/uL (ref 1.4–7.7)
Neutrophils Relative %: 38.2 % — ABNORMAL LOW (ref 43.0–77.0)
Platelets: 318 10*3/uL (ref 150.0–400.0)
RBC: 4.16 Mil/uL (ref 3.87–5.11)
RDW: 13.8 % (ref 11.5–15.5)
WBC: 6.3 10*3/uL (ref 4.0–10.5)

## 2022-01-27 LAB — COMPREHENSIVE METABOLIC PANEL
ALT: 19 U/L (ref 0–35)
AST: 20 U/L (ref 0–37)
Albumin: 4.3 g/dL (ref 3.5–5.2)
Alkaline Phosphatase: 72 U/L (ref 39–117)
BUN: 16 mg/dL (ref 6–23)
CO2: 28 mEq/L (ref 19–32)
Calcium: 8.9 mg/dL (ref 8.4–10.5)
Chloride: 103 mEq/L (ref 96–112)
Creatinine, Ser: 0.72 mg/dL (ref 0.40–1.20)
GFR: 84.33 mL/min (ref >60.00)
Glucose, Bld: 95 mg/dL (ref 70–99)
Potassium: 4.5 mEq/L (ref 3.5–5.1)
Sodium: 139 mEq/L (ref 135–145)
Total Bilirubin: 0.5 mg/dL (ref 0.2–1.2)
Total Protein: 6.4 g/dL (ref 6.0–8.3)

## 2022-01-27 LAB — TSH: TSH: 4.31 u[IU]/mL (ref 0.35–5.50)

## 2022-01-27 LAB — VITAMIN B12: Vitamin B-12: 324 pg/mL (ref 211–911)

## 2022-01-27 LAB — VITAMIN D 25 HYDROXY (VIT D DEFICIENCY, FRACTURES): VITD: 24.06 ng/mL — ABNORMAL LOW (ref 30.00–100.00)

## 2022-01-27 LAB — HEMOGLOBIN A1C: Hgb A1c MFr Bld: 6.3 % (ref 4.6–6.5)

## 2022-01-27 NOTE — Progress Notes (Signed)
Please inform patient of the following: ? ?Her A1c is in the prediabetic range.  Do not need to start meds but she should continue to work on diet and exercise.  We should recheck in 3 to 6 months. ? ?Vitamin D is low.  Recommend taking 1000 to 2000 IUs vitamin D daily.  We should recheck in 3 to 6 months. ? ?Everything else is NORMAL. ? ?Katina Degree. Jimmey Ralph, MD ?01/27/2022 12:35 PM  ?

## 2022-01-28 ENCOUNTER — Encounter: Payer: Self-pay | Admitting: Family Medicine

## 2022-01-28 ENCOUNTER — Ambulatory Visit (INDEPENDENT_AMBULATORY_CARE_PROVIDER_SITE_OTHER): Payer: Medicare Other | Admitting: Family Medicine

## 2022-01-28 VITALS — BP 124/78 | HR 94 | Temp 97.2°F | Ht 63.0 in | Wt 109.8 lb

## 2022-01-28 DIAGNOSIS — E559 Vitamin D deficiency, unspecified: Secondary | ICD-10-CM

## 2022-01-28 DIAGNOSIS — M069 Rheumatoid arthritis, unspecified: Secondary | ICD-10-CM | POA: Diagnosis not present

## 2022-01-28 DIAGNOSIS — L0591 Pilonidal cyst without abscess: Secondary | ICD-10-CM | POA: Diagnosis not present

## 2022-01-28 DIAGNOSIS — E7800 Pure hypercholesterolemia, unspecified: Secondary | ICD-10-CM | POA: Diagnosis not present

## 2022-01-28 MED ORDER — DOXYCYCLINE HYCLATE 100 MG PO TABS: 100.0000 mg | ORAL_TABLET | Freq: Two times a day (BID) | ORAL | 0 refills | Status: DC

## 2022-01-28 MED ORDER — PREDNISONE 20 MG PO TABS: 20.0000 mg | ORAL_TABLET | Freq: Every day | ORAL | 0 refills | Status: DC

## 2022-01-28 MED ORDER — VITAMIN D (ERGOCALCIFEROL) 1.25 MG (50000 UNIT) PO CAPS: 50000.0000 [IU] | ORAL_CAPSULE | ORAL | 0 refills | Status: DC

## 2022-01-28 NOTE — Patient Instructions (Signed)
It was very nice to see you today! ? ?You have a pilonidal cyst.  We will refer you to see a surgeon.  Please take the antibiotic if it starts to look infected. ? ?Please start vitamin D. ? ?I will refill your prednisone. ? ?Take care, ?Dr Jimmey Ralph ? ?PLEASE NOTE: ? ?If you had any lab tests please let us know if you have not heard back within a few days. You may see your results on mychart before we have a chance to review them but we will give you a call once they are reviewed by [place]. If we ordered any referrals today, please let us know if you have not heard from their office within the next week.  ? ?Please try these tips to maintain a healthy lifestyle: ? ?Eat at least 3 REAL meals and 1-2 snacks per day.  Aim for no more than 5 hours between eating.  If you eat breakfast, please do so within one hour of getting up.  ? ?Each meal should contain half fruits/vegetables, one quarter protein, and one quarter carbs (no bigger than a computer mouse) ? ?Cut down on sweet beverages. This includes juice, soda, and sweet tea.  ? ?Drink at least 1 glass of water with each meal and aim for at least 8 glasses per day ? ?Exercise at least 150 minutes every week.   ?

## 2022-01-28 NOTE — Assessment & Plan Note (Signed)
Mildly low at 24 on last blood draw.  We will start 50,000 IUs daily for 3 months and then recheck. ?

## 2022-01-28 NOTE — Assessment & Plan Note (Signed)
Mild flare recently.  Has upcoming trip to [facility].  We will start prednisone burst 20 mg daily for the next 14 days. ?

## 2022-01-28 NOTE — Progress Notes (Signed)
? ?  Patricia Underwood is a 71 y.o. female who presents today for an office visit. ? ?Assessment/Plan:  ?New/Acute Problems: ?Pilonidal cyst ?No red flags.  No signs of infection.  We will place referral to surgery for excision.  We will send in pocket prescription for doxycycline with instruction to not start unless she develops any symptoms concerning for infection. ? ?Chronic Problems Addressed Today: ?Rheumatoid arthritis (HCC) ?Mild flare recently.  Has upcoming trip to [facility].  We will start prednisone burst 20 mg daily for the next 14 days. ? ?Vitamin D deficiency ?Mildly low at 24 on last blood draw.  We will start 50,000 IUs daily for 3 months and then recheck. ? ? ?  ?Subjective:  ?HPI: ? ?See A/P for status of chronic conditions.  She has had increasing tailbone pain for the last several weeks.  She noticed a small lesion to the area.  No drainage.  This made it difficult for her to sit due to the pain.  No fevers or chills. ? ?   ?  ?Objective:  ?Physical Exam: ?BP 124/78 (BP Location: Right Arm)   Pulse 94   Temp (!) 97.2 ?F (36.2 ?C) (Temporal)   Ht  (1.6 m)   Wt 109 lb 12.8 oz (49.8 kg)   SpO2 100%   BMI 19.45 kg/m?   ?Gen: No acute distress, resting comfortably ?MSK: Small approximately 3 to 4 mm cystic lesion at gluteal cleft.  No drainage.  No surrounding erythema. ?Neuro: Grossly normal, moves all extremities ?Psych: Normal affect and thought content ? ?   ? ?Katina Degree. Jimmey Ralph, MD ?01/28/2022 2:54 PM  ? ?

## 2022-02-05 DIAGNOSIS — Z20822 Contact with and (suspected) exposure to covid-19: Secondary | ICD-10-CM | POA: Diagnosis not present

## 2022-02-06 ENCOUNTER — Other Ambulatory Visit: Payer: Medicare Other

## 2022-02-12 DIAGNOSIS — Z79899 Other long term (current) drug therapy: Secondary | ICD-10-CM | POA: Diagnosis not present

## 2022-02-12 DIAGNOSIS — R030 Elevated blood-pressure reading, without diagnosis of hypertension: Secondary | ICD-10-CM | POA: Diagnosis not present

## 2022-02-12 DIAGNOSIS — M545 Low back pain, unspecified: Secondary | ICD-10-CM | POA: Diagnosis not present

## 2022-02-12 DIAGNOSIS — Z6820 Body mass index (BMI) 20.0-20.9, adult: Secondary | ICD-10-CM | POA: Diagnosis not present

## 2022-02-12 DIAGNOSIS — G8929 Other chronic pain: Secondary | ICD-10-CM | POA: Diagnosis not present

## 2022-02-12 DIAGNOSIS — Z681 Body mass index (BMI) 19 or less, adult: Secondary | ICD-10-CM | POA: Diagnosis not present

## 2022-02-17 ENCOUNTER — Telehealth: Payer: Self-pay

## 2022-02-17 ENCOUNTER — Other Ambulatory Visit: Payer: Self-pay | Admitting: *Deleted

## 2022-02-17 MED ORDER — PREDNISONE 20 MG PO TABS: 20.0000 mg | ORAL_TABLET | Freq: Every day | ORAL | 0 refills | Status: DC

## 2022-02-17 NOTE — Telephone Encounter (Signed)
Ok to refill one more time but we need to have her follow up with rheumatology or sports medicine if not improving. ? ?Patricia Underwood. Jimmey Ralph, MD ?02/17/2022 12:53 PM  ? ?

## 2022-02-17 NOTE — Telephone Encounter (Signed)
States Dr. Jerline Pain recently prescribed prednisone.  Patient states she has completed this script.  States her arthritis is bothering her so much that she believes she needs another round.  Would like to know if Dr. Jerline Pain could send another round to Patricia Underwood at [facility].

## 2022-02-17 NOTE — Telephone Encounter (Signed)
Patient aware Rx send to pharmacy  ?Notified if no changes need Rheumatology or sport medicine  ?

## 2022-02-17 NOTE — Telephone Encounter (Signed)
See note and advise °

## 2022-02-24 DIAGNOSIS — I700 Atherosclerosis of aorta: Secondary | ICD-10-CM | POA: Diagnosis not present

## 2022-02-24 DIAGNOSIS — L720 Epidermal cyst: Secondary | ICD-10-CM | POA: Diagnosis not present

## 2022-03-07 ENCOUNTER — Encounter: Payer: Self-pay | Admitting: Family Medicine

## 2022-03-07 ENCOUNTER — Ambulatory Visit (INDEPENDENT_AMBULATORY_CARE_PROVIDER_SITE_OTHER): Payer: Medicare Other | Admitting: Family Medicine

## 2022-03-07 VITALS — BP 102/60 | HR 70 | Temp 97.8°F | Ht 63.0 in | Wt 106.2 lb

## 2022-03-07 DIAGNOSIS — M069 Rheumatoid arthritis, unspecified: Secondary | ICD-10-CM

## 2022-03-07 DIAGNOSIS — M797 Fibromyalgia: Secondary | ICD-10-CM | POA: Diagnosis not present

## 2022-03-07 MED ORDER — PREDNISONE 20 MG PO TABS: 20.0000 mg | ORAL_TABLET | Freq: Every day | ORAL | 0 refills | Status: DC

## 2022-03-07 NOTE — Progress Notes (Signed)
? ?  Patricia Underwood is a 71 y.o. female who presents today for an office visit. ? ?Assessment/Plan:  ?Chronic Problems Addressed Today: ?Rheumatoid arthritis (HCC) ?Pain is not controlled.  We will give another 14 day course of prednisone 20 mg daily.  She needs to establish with a rheumatologist.  We will place referral today. ? ?  ?Subjective:  ?HPI: ? ?See A/P for status of chronic conditions.  She is having worsening rheumatoid arthritis pain.  She has seen rheumatology in the past but has not been seen for over a year or so.  Pain comes and goes.  We have done a few prednisone burst over the last few months and she would like to have this done again.  Pain is located all over her body and in most of her joints.  At 1 point there was discussion for her to start hydroxychloroquine however she did not wish to start this.  She has not been on any DMARDs.  ? ?   ?  ?Objective:  ?Physical Exam: ?BP 102/60   Pulse 70   Temp 97.8 ?F (36.6 ?C)   Ht  (1.6 m)   Wt 106 lb 3.2 oz (48.2 kg)   SpO2 98%   BMI 18.81 kg/m?   ?Gen: No acute distress, resting comfortably ?Neuro: Grossly normal, moves all extremities ?Psych: Normal affect and thought content ? ?   ? ?Katina Degree. Jimmey Ralph, MD ?03/07/2022 3:28 PM  ?

## 2022-03-07 NOTE — Assessment & Plan Note (Signed)
Pain is not controlled.  We will give another 14 day course of prednisone 20 mg daily.  She needs to establish with a rheumatologist.  We will place referral today. ?

## 2022-03-14 ENCOUNTER — Telehealth: Payer: Self-pay | Admitting: Family Medicine

## 2022-03-14 ENCOUNTER — Other Ambulatory Visit: Payer: Self-pay

## 2022-03-14 MED ORDER — PREDNISONE 20 MG PO TABS: 20.0000 mg | ORAL_TABLET | Freq: Every day | ORAL | 0 refills | Status: DC

## 2022-03-14 NOTE — Telephone Encounter (Signed)
Please advise for refill?  

## 2022-03-14 NOTE — Telephone Encounter (Signed)
We sent in 2 weeks worth a week a go. She should have another week left. ? ?Katina Degree. Jimmey Ralph, MD ?03/14/2022 2:00 PM  ? ?

## 2022-03-14 NOTE — Telephone Encounter (Signed)
Patient going out of town this weekend- running out of predsnisone on Thursday- would like a refill before she runs out -  ?

## 2022-03-14 NOTE — Telephone Encounter (Signed)
Sent in Rx to pharmacy per provider request and pt asked about referral to Rheumatologist being denied, advised note as to why denied and pt verbalized understanding.  ?

## 2022-03-14 NOTE — Telephone Encounter (Signed)
Ok to send in refill. ? ?Patricia Underwood. Jimmey Ralph, MD ?03/14/2022 3:14 PM  ? ?

## 2022-03-14 NOTE — Telephone Encounter (Signed)
Patient states she is leaving this Sunday and won't be back until 03/24/22. She will be out of medicine this Thursday. Not wanting to run out while out of town. Pt states in previous discussion it was mentioned she would stay on it a little longer since it is working and making her feel better. Please advise ?

## 2022-03-17 DIAGNOSIS — Z79899 Other long term (current) drug therapy: Secondary | ICD-10-CM | POA: Diagnosis not present

## 2022-03-17 DIAGNOSIS — Z681 Body mass index (BMI) 19 or less, adult: Secondary | ICD-10-CM | POA: Diagnosis not present

## 2022-03-17 DIAGNOSIS — R030 Elevated blood-pressure reading, without diagnosis of hypertension: Secondary | ICD-10-CM | POA: Diagnosis not present

## 2022-03-17 DIAGNOSIS — G8929 Other chronic pain: Secondary | ICD-10-CM | POA: Diagnosis not present

## 2022-03-17 DIAGNOSIS — M545 Low back pain, unspecified: Secondary | ICD-10-CM | POA: Diagnosis not present

## 2022-03-30 ENCOUNTER — Encounter (HOSPITAL_COMMUNITY): Payer: Self-pay | Admitting: Emergency Medicine

## 2022-03-30 ENCOUNTER — Emergency Department (HOSPITAL_COMMUNITY)
Admission: EM | Admit: 2022-03-30 | Discharge: 2022-03-30 | Disposition: A | Discharge status: Home / Self Care | Payer: Medicare Other | Attending: Emergency Medicine | Admitting: Emergency Medicine

## 2022-03-30 DIAGNOSIS — Z23 Encounter for immunization: Secondary | ICD-10-CM | POA: Diagnosis not present

## 2022-03-30 DIAGNOSIS — L539 Erythematous condition, unspecified: Secondary | ICD-10-CM | POA: Insufficient documentation

## 2022-03-30 DIAGNOSIS — L03114 Cellulitis of left upper limb: Secondary | ICD-10-CM | POA: Diagnosis not present

## 2022-03-30 DIAGNOSIS — W5501XA Bitten by cat, initial encounter: Secondary | ICD-10-CM | POA: Insufficient documentation

## 2022-03-30 MED ORDER — TETANUS-DIPHTH-ACELL PERTUSSIS 5-2.5-18.5 LF-MCG/0.5 IM SUSY
0.5000 mL | PREFILLED_SYRINGE | Freq: Once | INTRAMUSCULAR | Status: AC
  Administered 2022-03-30: 0.5 mL via INTRAMUSCULAR
  Filled 2022-03-30: qty 0.5

## 2022-03-30 MED ORDER — AMOXICILLIN-POT CLAVULANATE 875-125 MG PO TABS: 1.0000 | ORAL_TABLET | Freq: Two times a day (BID) | ORAL | 0 refills | Status: DC

## 2022-03-30 MED ORDER — AMOXICILLIN-POT CLAVULANATE 875-125 MG PO TABS
1.0000 | ORAL_TABLET | Freq: Once | ORAL | Status: AC
Start: 2022-03-30 — End: 2022-03-30
  Administered 2022-03-30: 1 via ORAL
  Filled 2022-03-30: qty 1

## 2022-03-30 NOTE — ED Provider Notes (Signed)
[facility]-EMERGENCY DEPT Provider Note   CSN: [id number] Arrival date & time: 03/30/22  1219     History  Chief Complaint  Patient presents with   Insect Bite    Patricia Underwood is a 71 y.o. female.  HPI  Patient presents due to cat bite on left wrist.  This happened earlier this morning, it was her cat and is up-to-date on vaccines.  Associated with erythema and warmth, the erythema has not spread elsewhere.  Patent patient is unsure when her last tetanus shot was.  Denies any fevers or chills.  She cleaned out the wound, no retained foreign bodies.  Home Medications Prior to Admission medications   Medication Sig Start Date End Date Taking? Authorizing Provider  atorvastatin (LIPITOR) 20 MG tablet Take by mouth.    [provider]  benzonatate (TESSALON) 200 MG capsule Take 1 capsule (200 mg total) by mouth 2 (two) times daily as needed for cough. 01/07/22   Ardith Dark, MD  cyanocobalamin (,VITAMIN B-12,) 1000 MCG/ML injection 1000 mcg (1 mg) injection once every other week 10/18/21   Ardith Dark, MD  predniSONE (DELTASONE) 20 MG tablet Take 1 tablet (20 mg total) by mouth daily with breakfast. 03/14/22   Ardith Dark, MD  triazolam (HALCION) 0.25 MG tablet Take 1 tablet (0.25 mg total) by mouth at bedtime as needed. for sleep 11/18/21   Ardith Dark, MD  Vitamin D, Ergocalciferol, (DRISDOL) 1.25 MG (50000 UNIT) CAPS capsule Take 1 capsule (50,000 Units total) by mouth every 7 (seven) days. 01/28/22   Ardith Dark, MD      Allergies    Ambien [zolpidem tartrate], Flexeril [cyclobenzaprine], and Naproxen    Review of Systems   Review of Systems  Physical Exam Updated Vital Signs BP (!) 132/91 (BP Location: Left Arm)   Pulse 89   Temp 98.9 F (37.2 C) (Oral)   Resp 18   SpO2 100%  Physical Exam Vitals and nursing note reviewed. Exam conducted with a chaperone present.  Constitutional:      General: She is not in acute distress.     Appearance: Normal appearance.  HENT:     Head: Normocephalic and atraumatic.  Eyes:     General: No scleral icterus.    Extraocular Movements: Extraocular movements intact.     Pupils: Pupils are equal, round, and reactive to light.  Cardiovascular:     Pulses: Normal pulses.  Musculoskeletal:        General: Normal range of motion.  Skin:    General: Skin is warm.     Capillary Refill: Capillary refill takes less than 2 seconds.     Coloration: Skin is not jaundiced.     Findings: Erythema present.  Neurological:     Mental Status: She is alert. Mental status is at baseline.     Coordination: Coordination normal.     ED Results / Procedures / Treatments   Labs (all labs ordered are listed, but only abnormal results are displayed) Labs Reviewed - No data to display  EKG None  Radiology No results found.  Procedures Procedures    Medications Ordered in ED Medications  amoxicillin-clavulanate (AUGMENTIN) 875-125 MG per tablet 1 tablet (has no administration in time range)  Tdap (BOOSTRIX) injection 0.5 mL (has no administration in time range)    ED Course/ Medical Decision Making/ A&P  Medical Decision Making Risk Prescription drug management.   Patient presents due to cat bite.  She is neurovascular intact with brisk cap refill and radial pulse 2+.  Complete ROM.  Tetanus not up-to-date, updated today.  Patient owns a pet and is up-to-date on vaccines so no rabies indicated.  Will discharge with Augmentin and strict return precautions.        Final Clinical Impression(s) / ED Diagnoses Final diagnoses:  None    Rx / DC Orders ED Discharge Orders     None         Theron Arista, [place] 03/30/22 1300    Milagros Loll, MD 03/30/22 1402

## 2022-03-30 NOTE — ED Triage Notes (Signed)
Patient here from home reporting insect bite to left wrist. Redness and inflammation noted.

## 2022-03-30 NOTE — Discharge Instructions (Addendum)
Take Augmentin twice daily for the next 10 days.  Tetanus was updated today, this should be good for the next 10 years.  Follow-up with your primary next week for wound recheck.  If the redness spreads after 48 hours return back to the ED urgent care for second evaluation.

## 2022-04-18 ENCOUNTER — Other Ambulatory Visit: Payer: Self-pay | Admitting: Family Medicine

## 2022-04-24 DIAGNOSIS — Z79899 Other long term (current) drug therapy: Secondary | ICD-10-CM | POA: Diagnosis not present

## 2022-04-24 DIAGNOSIS — M545 Low back pain, unspecified: Secondary | ICD-10-CM | POA: Diagnosis not present

## 2022-04-24 DIAGNOSIS — R030 Elevated blood-pressure reading, without diagnosis of hypertension: Secondary | ICD-10-CM | POA: Diagnosis not present

## 2022-04-24 DIAGNOSIS — G8929 Other chronic pain: Secondary | ICD-10-CM | POA: Diagnosis not present

## 2022-04-24 DIAGNOSIS — Z681 Body mass index (BMI) 19 or less, adult: Secondary | ICD-10-CM | POA: Diagnosis not present

## 2022-04-28 DIAGNOSIS — Z79899 Other long term (current) drug therapy: Secondary | ICD-10-CM | POA: Diagnosis not present

## 2022-05-05 ENCOUNTER — Encounter: Payer: Self-pay | Admitting: Family Medicine

## 2022-05-05 ENCOUNTER — Ambulatory Visit (INDEPENDENT_AMBULATORY_CARE_PROVIDER_SITE_OTHER): Payer: Medicare Other | Admitting: Family Medicine

## 2022-05-05 VITALS — BP 120/66 | HR 71 | Temp 98.4°F | Ht 63.0 in | Wt 103.0 lb

## 2022-05-05 DIAGNOSIS — M0609 Rheumatoid arthritis without rheumatoid factor, multiple sites: Secondary | ICD-10-CM | POA: Diagnosis not present

## 2022-05-05 MED ORDER — PREDNISONE 20 MG PO TABS: 20.0000 mg | ORAL_TABLET | Freq: Every day | ORAL | 0 refills | Status: DC

## 2022-05-06 ENCOUNTER — Ambulatory Visit: Payer: Medicare Other | Admitting: Family Medicine

## 2022-05-22 ENCOUNTER — Encounter: Payer: Self-pay | Admitting: Family Medicine

## 2022-05-22 ENCOUNTER — Ambulatory Visit (INDEPENDENT_AMBULATORY_CARE_PROVIDER_SITE_OTHER): Payer: Medicare Other | Admitting: Family Medicine

## 2022-05-22 VITALS — BP 110/62 | HR 70 | Temp 98.0°F | Ht 63.0 in | Wt 102.6 lb

## 2022-05-22 DIAGNOSIS — F5102 Adjustment insomnia: Secondary | ICD-10-CM

## 2022-05-22 DIAGNOSIS — M0609 Rheumatoid arthritis without rheumatoid factor, multiple sites: Secondary | ICD-10-CM | POA: Diagnosis not present

## 2022-05-22 MED ORDER — PREDNISONE 20 MG PO TABS: 20.0000 mg | ORAL_TABLET | Freq: Every day | ORAL | 0 refills | Status: DC

## 2022-05-22 NOTE — Assessment & Plan Note (Signed)
Pain still not controlled.  She is aware of long-term consequences of chronic and repeated prednisone burst however at this point she feels like the benefit of pain relief outweighs these risk.  We will give another 14-day burst of prednisone 20 mg daily.  She has been referred to rheumatology and will need to follow-up with them soon.  We discussed reasons to return to care.  Gave contact information for rheumatologist.

## 2022-05-22 NOTE — Progress Notes (Signed)
   Patricia Underwood is a 70 y.o. female who presents today for an office visit.  Assessment/Plan:  Chronic Problems Addressed Today: Rheumatoid arthritis (HCC) Pain still not controlled.  She is aware of long-term consequences of chronic and repeated prednisone burst however at this point she feels like the benefit of pain relief outweighs these risk.  We will give another 14-day burst of prednisone 20 mg daily.  She has been referred to rheumatology and will need to follow-up with them soon.  We discussed reasons to return to care.  Gave contact information for rheumatologist.  Insomnia This is not controlled however she has not been taking her Halcion while she is taking care of her dog.    Subjective:  HPI:  See A/p for status of chronic conditions.  She is here today with flareup of rheumatoid arthritis pain.  We last saw her for this about 3 months ago.  At that time she was given a 14-day burst of prednisone 20 mg daily.  This worked well.  Recently she has been sleeping downstairs with her ill dog on a blow up mattress which has exacerbated her pain.  She request a refill on prednisone.  She was referred to rheumatologist has not heard anything back yet.       Objective:  Physical Exam: BP 110/62   Pulse 70   Temp 98 F (36.7 C) (Temporal)   Ht 5\' 3"  (1.6 m)   Wt 102 lb 9.6 oz (46.5 kg)   SpO2 96%   BMI 18.17 kg/m   Gen: No acute distress, resting comfortably Neuro: Grossly normal, moves all extremities Psych: Normal affect and thought content       M. , MD 05/22/2022 12:02 PM

## 2022-05-22 NOTE — Assessment & Plan Note (Signed)
This is not controlled however she has not been taking her Halcion while she is taking care of her dog.

## 2022-05-22 NOTE — Patient Instructions (Signed)
It was very nice to see you today!  We will refill your prednisone.  Please call to schedule appointment with rheumatologist at [phone]  Take care, Dr Jimmey Ralph  PLEASE NOTE:  If you had any lab tests please let us know if you have not heard back within a few days. You may see your results on mychart before we have a chance to review them but we will give you a call once they are reviewed by [place]. If we ordered any referrals today, please let us know if you have not heard from their office within the next week.   Please try these tips to maintain a healthy lifestyle:  Eat at least 3 REAL meals and 1-2 snacks per day.  Aim for no more than 5 hours between eating.  If you eat breakfast, please do so within one hour of getting up.   Each meal should contain half fruits/vegetables, one quarter protein, and one quarter carbs (no bigger than a computer mouse)  Cut down on sweet beverages. This includes juice, soda, and sweet tea.   Drink at least 1 glass of water with each meal and aim for at least 8 glasses per day  Exercise at least 150 minutes every week.

## 2022-05-27 DIAGNOSIS — G8929 Other chronic pain: Secondary | ICD-10-CM | POA: Diagnosis not present

## 2022-05-27 DIAGNOSIS — Z79899 Other long term (current) drug therapy: Secondary | ICD-10-CM | POA: Diagnosis not present

## 2022-05-27 DIAGNOSIS — M130 Polyarthritis, unspecified: Secondary | ICD-10-CM | POA: Diagnosis not present

## 2022-05-27 DIAGNOSIS — R030 Elevated blood-pressure reading, without diagnosis of hypertension: Secondary | ICD-10-CM | POA: Diagnosis not present

## 2022-05-27 DIAGNOSIS — M545 Low back pain, unspecified: Secondary | ICD-10-CM | POA: Diagnosis not present

## 2022-05-27 DIAGNOSIS — Z681 Body mass index (BMI) 19 or less, adult: Secondary | ICD-10-CM | POA: Diagnosis not present

## 2022-05-29 DIAGNOSIS — Z79899 Other long term (current) drug therapy: Secondary | ICD-10-CM | POA: Diagnosis not present

## 2022-06-05 ENCOUNTER — Encounter: Payer: Self-pay | Admitting: Family Medicine

## 2022-06-05 ENCOUNTER — Ambulatory Visit (INDEPENDENT_AMBULATORY_CARE_PROVIDER_SITE_OTHER): Payer: Medicare Other | Admitting: Family Medicine

## 2022-06-05 VITALS — BP 127/67 | HR 82 | Temp 98.0°F | Ht 63.0 in | Wt 102.6 lb

## 2022-06-05 DIAGNOSIS — F419 Anxiety disorder, unspecified: Secondary | ICD-10-CM

## 2022-06-05 DIAGNOSIS — M0609 Rheumatoid arthritis without rheumatoid factor, multiple sites: Secondary | ICD-10-CM | POA: Diagnosis not present

## 2022-06-05 MED ORDER — PREDNISONE 20 MG PO TABS
20.0000 mg | ORAL_TABLET | Freq: Every day | ORAL | 0 refills | Status: DC
Start: 2022-06-05 — End: 2022-06-13

## 2022-06-05 NOTE — Progress Notes (Signed)
   Patricia Underwood is a 71 y.o. female who presents today for an office visit.  Assessment/Plan:  Chronic Problems Addressed Today: Rheumatoid arthritis (HCC) Pain is still not controlled though she does well with prednisone.  She is aware of long-term side effects of chronic and repeated prednisone burst however at this point she would like to continue with this as this is the only thing that she has found recently to help with the pain.  She does have a referral to rheumatology pending but will be about another month or so before she can see them.  We will refill her prednisone.  Discussed with patient that further management should ideally come from her rheumatologist.  Anxiety Still has quite a bit of stress due to life stressors.  She is on Halcion as needed which works well.  We discussed referral for behavioral health or medication changes however she deferred for today.     Subjective:  HPI:  See A/p for status of chronic conditions.         Objective:  Physical Exam: BP 127/67   Pulse 82   Temp 98 F (36.7 C) (Temporal)   Ht 5\' 3"  (1.6 m)   Wt 102 lb 9.6 oz (46.5 kg)   SpO2 98%   BMI 18.17 kg/m   Gen: No acute distress, resting comfortably CV: Regular rate and rhythm with no murmurs appreciated Pulm: Normal work of breathing, clear to auscultation bilaterally with no crackles, wheezes, or rhonchi Neuro: Grossly normal, moves all extremities Psych: Normal affect and thought content       M. , MD 06/05/2022 11:32 AM

## 2022-06-05 NOTE — Assessment & Plan Note (Signed)
Pain is still not controlled though she does well with prednisone.  She is aware of long-term side effects of chronic and repeated prednisone burst however at this point she would like to continue with this as this is the only thing that she has found recently to help with the pain.  She does have a referral to rheumatology pending but will be about another month or so before she can see them.  We will refill her prednisone.  Discussed with patient that further management should ideally come from her rheumatologist.

## 2022-06-05 NOTE — Assessment & Plan Note (Signed)
Still has quite a bit of stress due to life stressors.  She is on Halcion as needed which works well.  We discussed referral for behavioral health or medication changes however she deferred for today.

## 2022-06-05 NOTE — Patient Instructions (Signed)
It was very nice to see you today!  I will send in more the prednisone.  Please let us know if we can do anything else to help.  Take care, Dr Jimmey Ralph  PLEASE NOTE:  If you had any lab tests please let us know if you have not heard back within a few days. You may see your results on mychart before we have a chance to review them but we will give you a call once they are reviewed by [place]. If we ordered any referrals today, please let us know if you have not heard from their office within the next week.   Please try these tips to maintain a healthy lifestyle:  Eat at least 3 REAL meals and 1-2 snacks per day.  Aim for no more than 5 hours between eating.  If you eat breakfast, please do so within one hour of getting up.   Each meal should contain half fruits/vegetables, one quarter protein, and one quarter carbs (no bigger than a computer mouse)  Cut down on sweet beverages. This includes juice, soda, and sweet tea.   Drink at least 1 glass of water with each meal and aim for at least 8 glasses per day  Exercise at least 150 minutes every week.

## 2022-06-09 ENCOUNTER — Other Ambulatory Visit: Payer: Self-pay

## 2022-06-09 ENCOUNTER — Emergency Department (HOSPITAL_BASED_OUTPATIENT_CLINIC_OR_DEPARTMENT_OTHER)
Admission: EM | Admit: 2022-06-09 | Discharge: 2022-06-09 | Disposition: A | Discharge status: Home / Self Care | Payer: Medicare Other | Attending: Emergency Medicine | Admitting: Emergency Medicine

## 2022-06-09 DIAGNOSIS — H6122 Impacted cerumen, left ear: Secondary | ICD-10-CM | POA: Diagnosis not present

## 2022-06-09 DIAGNOSIS — H6123 Impacted cerumen, bilateral: Secondary | ICD-10-CM | POA: Diagnosis not present

## 2022-06-09 DIAGNOSIS — H9201 Otalgia, right ear: Secondary | ICD-10-CM | POA: Insufficient documentation

## 2022-06-09 NOTE — ED Provider Notes (Signed)
[facility] EMERGENCY DEPT  Provider Note  CSN: [id number] Arrival date & time: 06/09/22 5784  History Chief Complaint  Patient presents with   Foreign Body in Ear    Patricia Underwood is a 71 y.o. female with history of hearing loss wears hearing aid has had trouble with her right ear for the last couple of days and is concerned a plastic piece from her hearing aid is stuck in her canal. She has been trying to dig in there with her finger and thought she was able to feel it. No bleeding or drainage.    Home Medications Prior to Admission medications   Medication Sig Start Date End Date Taking? Authorizing Provider  atorvastatin (LIPITOR) 20 MG tablet Take by mouth.    [provider]  cyanocobalamin (,VITAMIN B-12,) 1000 MCG/ML injection 1000 mcg (1 mg) injection once every other week 10/18/21   Ardith Dark, MD  predniSONE (DELTASONE) 20 MG tablet Take 1 tablet (20 mg total) by mouth daily with breakfast. 06/05/22   Ardith Dark, MD  triazolam (HALCION) 0.25 MG tablet Take 1 tablet (0.25 mg total) by mouth at bedtime as needed. for sleep 11/18/21   Ardith Dark, MD  Vitamin D, Ergocalciferol, (DRISDOL) 1.25 MG (50000 UNIT) CAPS capsule Take 1 capsule (50,000 Units total) by mouth every 7 (seven) days. 01/28/22   Ardith Dark, MD     Allergies    Ambien [zolpidem tartrate], Flexeril [cyclobenzaprine], and Naproxen   Review of Systems   Review of Systems Please see HPI for pertinent positives and negatives  Physical Exam BP (!) 166/98   Pulse 79   Temp 98 F (36.7 C) (Oral)   Resp 17   Ht 5\' 3"  (1.6 m)   Wt 46.5 kg   SpO2 99%   BMI 18.16 kg/m   Physical Exam Vitals and nursing note reviewed.  HENT:     Head: Normocephalic.     Right Ear: Tympanic membrane and ear canal normal.     Ears:     Comments: Cerumen impaction on L canal (not the side of concern)    Nose: Nose normal.  Eyes:     Extraocular Movements: Extraocular movements intact.   Pulmonary:     Effort: Pulmonary effort is normal.  Musculoskeletal:        General: Normal range of motion.     Cervical back: Neck supple.  Skin:    Findings: No rash (on exposed skin).  Neurological:     Mental Status: She is alert and oriented to person, place, and time.  Psychiatric:        Mood and Affect: Mood normal.     ED Results / Procedures / Treatments   EKG None  Procedures Procedures  Medications Ordered in the ED Medications - No data to display  Initial Impression and Plan  Large ball of wax was removed from the L canal with a lighted curette. Subsequent exam shows a mildly friable canal, but normal TM. No FB seen on the Right side. She was encouraged to follow up with ENT and/or audiology regarding her difficulties with her R sided hearing aid.   ED Course       MDM Rules/Calculators/A&P Medical Decision Making Problems Addressed: Impacted cerumen of left ear: acute illness or injury Otalgia of right ear: acute illness or injury    Final Clinical Impression(s) / ED Diagnoses Final diagnoses:  Otalgia of right ear  Impacted cerumen of left ear  Rx / DC Orders ED Discharge Orders     None        Pollyann Savoy, MD 06/09/22 [phone]

## 2022-06-09 NOTE — ED Triage Notes (Signed)
POV, hearing aid earpiece cover in pt right ear. Sts it feels full. Amb, alert and oriented x 4.

## 2022-06-13 ENCOUNTER — Other Ambulatory Visit: Payer: Self-pay | Admitting: *Deleted

## 2022-06-13 ENCOUNTER — Telehealth: Payer: Self-pay | Admitting: Family Medicine

## 2022-06-13 ENCOUNTER — Telehealth: Payer: Self-pay | Admitting: *Deleted

## 2022-06-13 MED ORDER — TRIAZOLAM 0.25 MG PO TABS: 0.2500 mg | ORAL_TABLET | Freq: Every evening | ORAL | 5 refills | Status: DC | PRN

## 2022-06-13 MED ORDER — TRIAZOLAM 0.25 MG PO TABS: 0.2500 mg | ORAL_TABLET | Freq: Every evening | ORAL | 0 refills | Status: DC | PRN

## 2022-06-13 MED ORDER — PREDNISONE 20 MG PO TABS: 20.0000 mg | ORAL_TABLET | Freq: Every day | ORAL | 0 refills | Status: DC

## 2022-06-13 NOTE — Telephone Encounter (Signed)
Left detailed message on personal voicemail Rx's were sent to pharmacy as requested. Any questions please contact the office.

## 2022-06-13 NOTE — Telephone Encounter (Signed)
     Patient  visited [facility] on 06/09/2022  for foreign object in ear    Have you been able to follow up with your primary care physician? yes  The patient was able to obtain any needed medicine or equipment.  Are there diet recommendations that you are having difficulty following?n/a  Patient expresses understanding of discharge instructions and education provided has no other needs at this time.    Yehuda Mao Greenauer -[facility] [place], [facility] [phone] [address] [place] , [place] [address] Email : [email or web address]

## 2022-06-13 NOTE — Telephone Encounter (Signed)
Prednisone send [facility] pharmacy

## 2022-06-13 NOTE — Telephone Encounter (Signed)
Ok with me. Please place any necessary orders. 

## 2022-06-13 NOTE — Telephone Encounter (Signed)
Pt states: -left all of her medications out of town -will not have for 2 weeks -thinks she has a refill on her Statin pills     Pt requests -Call back with approval/denial -2 weeks worth of script for two medications.   -"Sleeping pills"  -predniSONE (DELTASONE) 20 MG tablet [[id number]]    Preferred pharmacy: [facility] [id number] - [address] [place] - [address]  [address] [place] [address]  Phone:  [phone]  Fax:  [phone]

## 2022-06-26 ENCOUNTER — Ambulatory Visit (INDEPENDENT_AMBULATORY_CARE_PROVIDER_SITE_OTHER): Payer: Medicare Other | Admitting: Family Medicine

## 2022-06-26 ENCOUNTER — Encounter: Payer: Self-pay | Admitting: Family Medicine

## 2022-06-26 ENCOUNTER — Ambulatory Visit (INDEPENDENT_AMBULATORY_CARE_PROVIDER_SITE_OTHER)
Admission: RE | Admit: 2022-06-26 | Discharge: 2022-06-26 | Disposition: A | Discharge status: Home / Self Care | Payer: Medicare Other | Source: Ambulatory Visit | Attending: Family Medicine | Admitting: Family Medicine

## 2022-06-26 VITALS — BP 138/88 | HR 90 | Temp 97.9°F | Ht 63.0 in | Wt 100.8 lb

## 2022-06-26 DIAGNOSIS — M0609 Rheumatoid arthritis without rheumatoid factor, multiple sites: Secondary | ICD-10-CM

## 2022-06-26 DIAGNOSIS — M5440 Lumbago with sciatica, unspecified side: Secondary | ICD-10-CM | POA: Diagnosis not present

## 2022-06-26 DIAGNOSIS — G8929 Other chronic pain: Secondary | ICD-10-CM

## 2022-06-26 DIAGNOSIS — M545 Low back pain, unspecified: Secondary | ICD-10-CM | POA: Diagnosis not present

## 2022-06-26 MED ORDER — BACLOFEN 5 MG PO TABS: 5.0000 mg | ORAL_TABLET | Freq: Three times a day (TID) | ORAL | 0 refills | Status: DC | PRN

## 2022-06-26 MED ORDER — PREDNISONE 20 MG PO TABS: 20.0000 mg | ORAL_TABLET | Freq: Every day | ORAL | 0 refills | Status: DC

## 2022-06-26 NOTE — Assessment & Plan Note (Signed)
Has upcoming appointment with rheumatology.  She will continue prednisone until she can get into see him.

## 2022-06-26 NOTE — Progress Notes (Signed)
   Patricia Underwood is a 71 y.o. female who presents today for an office visit.  Assessment/Plan:  Chronic Problems Addressed Today: Chronic low back pain with sciatica No red flags.  She did have a flareup a few weeks ago after moving potted plants at home.  Likely muscular strain.  Reassuring exam today though she has been on prolonged prednisone recently due to her rheumatoid arthritis.  We will check x-ray to rule out compression fracture.  We will start baclofen.  She cannot take NSAIDs.  We discussed tramadol however she deferred. She has previously seen orthopedics a few years ago and likely referred again today.  We will place referral today.  We discussed reasons to return to care.  Follow-up as needed.  Rheumatoid arthritis (HCC) Has upcoming appointment with rheumatology.  She will continue prednisone until she can get into see him.     Subjective:  HPI:  Patient here with back pain. Started about a week ago after moving large potted plants. Pain seems to be worsening.  She fell yesterday because the pain was so severe. Mobility is very limited due to the pain.        Objective:  Physical Exam: BP 138/88   Pulse 90   Temp 97.9 F (36.6 C) (Temporal)   Ht 5\' 3"  (1.6 m)   Wt 100 lb 12.8 oz (45.7 kg)   SpO2 95%   BMI 17.86 kg/m   Gen: No acute distress, resting comfortably MSK: Back pain to bilateral lower lumbar paraspinal muscles.  Neurovascular intact distally. Neuro: Grossly normal, moves all extremities Psych: Normal affect and thought content       M. , MD 06/26/2022 9:58 AM

## 2022-06-26 NOTE — Patient Instructions (Signed)
It was very nice to see you today!  We will get an xray of your back today.  Please try the baclofen.  I will refer you to see an orthopedist.  Please let us know if your symptoms worsen or change.  Take care, Dr Jimmey Ralph  PLEASE NOTE:  If you had any lab tests please let us know if you have not heard back within a few days. You may see your results on mychart before we have a chance to review them but we will give you a call once they are reviewed by [place]. If we ordered any referrals today, please let us know if you have not heard from their office within the next week.   Please try these tips to maintain a healthy lifestyle:  Eat at least 3 REAL meals and 1-2 snacks per day.  Aim for no more than 5 hours between eating.  If you eat breakfast, please do so within one hour of getting up.   Each meal should contain half fruits/vegetables, one quarter protein, and one quarter carbs (no bigger than a computer mouse)  Cut down on sweet beverages. This includes juice, soda, and sweet tea.   Drink at least 1 glass of water with each meal and aim for at least 8 glasses per day  Exercise at least 150 minutes every week.

## 2022-06-26 NOTE — Assessment & Plan Note (Addendum)
No red flags.  She did have a flareup a few weeks ago after moving potted plants at home.  Likely muscular strain.  Reassuring exam today though she has been on prolonged prednisone recently due to her rheumatoid arthritis.  We will check x-ray to rule out compression fracture.  We will start baclofen.  She cannot take NSAIDs.  We discussed tramadol however she deferred. She has previously seen orthopedics a few years ago and likely referred again today.  We will place referral today.  We discussed reasons to return to care.  Follow-up as needed.

## 2022-06-27 ENCOUNTER — Telehealth: Payer: Self-pay | Admitting: Family Medicine

## 2022-06-27 ENCOUNTER — Ambulatory Visit (HOSPITAL_BASED_OUTPATIENT_CLINIC_OR_DEPARTMENT_OTHER): Payer: Medicare Other | Admitting: Orthopaedic Surgery

## 2022-06-27 NOTE — Telephone Encounter (Signed)
Patient requests to be called at ph# [phone] to be given her Xray results

## 2022-06-27 NOTE — Telephone Encounter (Signed)
Patient states: - Baclofen 5 mg that was prescribed on 08/16 is $60  when she goes to pick it up - She does not want to pay that much for a medication only to have to throw it away depending on how it could affect her.    Patient requests: - A different/more affordable medication be called in - Patricia Underwood give her a call back at [phone]

## 2022-06-28 NOTE — Telephone Encounter (Signed)
Please advise 

## 2022-06-30 ENCOUNTER — Encounter: Payer: Self-pay | Admitting: Family Medicine

## 2022-06-30 ENCOUNTER — Other Ambulatory Visit: Payer: Self-pay | Admitting: *Deleted

## 2022-06-30 ENCOUNTER — Ambulatory Visit (HOSPITAL_BASED_OUTPATIENT_CLINIC_OR_DEPARTMENT_OTHER): Payer: Medicare Other | Admitting: Orthopaedic Surgery

## 2022-06-30 ENCOUNTER — Telehealth: Payer: Self-pay | Admitting: Family Medicine

## 2022-06-30 ENCOUNTER — Ambulatory Visit (INDEPENDENT_AMBULATORY_CARE_PROVIDER_SITE_OTHER): Payer: Medicare Other | Admitting: Family Medicine

## 2022-06-30 VITALS — BP 152/74 | HR 77 | Temp 98.1°F | Ht 63.0 in | Wt 101.2 lb

## 2022-06-30 DIAGNOSIS — E538 Deficiency of other specified B group vitamins: Secondary | ICD-10-CM

## 2022-06-30 DIAGNOSIS — M0609 Rheumatoid arthritis without rheumatoid factor, multiple sites: Secondary | ICD-10-CM | POA: Diagnosis not present

## 2022-06-30 DIAGNOSIS — G8929 Other chronic pain: Secondary | ICD-10-CM | POA: Diagnosis not present

## 2022-06-30 DIAGNOSIS — M5440 Lumbago with sciatica, unspecified side: Secondary | ICD-10-CM

## 2022-06-30 DIAGNOSIS — M25552 Pain in left hip: Secondary | ICD-10-CM

## 2022-06-30 DIAGNOSIS — M25551 Pain in right hip: Secondary | ICD-10-CM | POA: Diagnosis not present

## 2022-06-30 DIAGNOSIS — E559 Vitamin D deficiency, unspecified: Secondary | ICD-10-CM

## 2022-06-30 MED ORDER — TIZANIDINE HCL 4 MG PO TABS: 4.0000 mg | ORAL_TABLET | Freq: Three times a day (TID) | ORAL | 0 refills | Status: DC

## 2022-06-30 MED ORDER — PREDNISONE 20 MG PO TABS: 20.0000 mg | ORAL_TABLET | Freq: Every day | ORAL | 0 refills | Status: DC

## 2022-06-30 NOTE — Telephone Encounter (Signed)
Patient have OV today  

## 2022-06-30 NOTE — Assessment & Plan Note (Signed)
Check vitamin D.  She is on 50,000 units weekly.

## 2022-06-30 NOTE — Assessment & Plan Note (Signed)
Check B12 

## 2022-06-30 NOTE — Telephone Encounter (Signed)
Rx send to pharmacy  

## 2022-06-30 NOTE — Assessment & Plan Note (Signed)
She will continue her current dose of prednisone until she can get into see rheumatology in the next few weeks.

## 2022-06-30 NOTE — Assessment & Plan Note (Signed)
Has upcoming appointment with orthopedics.  We recently got x-rays which showed degenerative changes.  She is currently on prednisone for rheumatoid arthritis which is helping with her pain.

## 2022-06-30 NOTE — Progress Notes (Signed)
   Patricia Underwood is a 71 y.o. female who presents today for an office visit.  Assessment/Plan:  Chronic Problems Addressed Today: Chronic low back pain with sciatica Has upcoming appointment with orthopedics.  We recently got x-rays which showed degenerative changes.  She is currently on prednisone for rheumatoid arthritis which is helping with her pain.  Rheumatoid arthritis (HCC) She will continue her current dose of prednisone until she can get into see rheumatology in the next few weeks.  B12 deficiency, Rx B12 injections Check B12.  Vitamin D deficiency Check vitamin D.  She is on 50,000 units weekly.    Subjective:  HPI:  See A/p for status chronic conditions.         Objective:  Physical Exam: BP (!) 152/74   Pulse 77   Temp 98.1 F (36.7 C) (Temporal)   Ht 5\' 3"  (1.6 m)   Wt 101 lb 3.2 oz (45.9 kg)   SpO2 99%   BMI 17.93 kg/m   Gen: No acute distress, resting comfortably Neuro: Grossly normal, moves all extremities Psych: Normal affect and thought content       M. , MD 06/30/2022 2:17 PM

## 2022-06-30 NOTE — Telephone Encounter (Signed)
Patient would like to receive xray results- patient aware MD has not reviewed yet and MD would have to do that in order for her to receive results -   Patient viewed results on mychart and has questions + terrible back pain - would like a call soon.

## 2022-06-30 NOTE — Patient Instructions (Signed)
It was very nice to see you today!  We will check blood work today.  Please go get the x-ray in your hip.  I will refill your medications today.  Take care, Dr Jimmey Ralph  PLEASE NOTE:  If you had any lab tests please let us know if you have not heard back within a few days. You may see your results on mychart before we have a chance to review them but we will give you a call once they are reviewed by [place]. If we ordered any referrals today, please let us know if you have not heard from their office within the next week.   Please try these tips to maintain a healthy lifestyle:  Eat at least 3 REAL meals and 1-2 snacks per day.  Aim for no more than 5 hours between eating.  If you eat breakfast, please do so within one hour of getting up.   Each meal should contain half fruits/vegetables, one quarter protein, and one quarter carbs (no bigger than a computer mouse)  Cut down on sweet beverages. This includes juice, soda, and sweet tea.   Drink at least 1 glass of water with each meal and aim for at least 8 glasses per day  Exercise at least 150 minutes every week.

## 2022-06-30 NOTE — Telephone Encounter (Signed)
We can send in tizanidine 4mg  tid prn instead.  . Katina Degree, MD 06/30/2022 8:07 AM

## 2022-07-01 ENCOUNTER — Ambulatory Visit (INDEPENDENT_AMBULATORY_CARE_PROVIDER_SITE_OTHER)
Admission: RE | Admit: 2022-07-01 | Discharge: 2022-07-01 | Disposition: A | Discharge status: Home / Self Care | Payer: Medicare Other | Source: Ambulatory Visit | Attending: Family Medicine | Admitting: Family Medicine

## 2022-07-01 ENCOUNTER — Other Ambulatory Visit: Payer: Medicare Other

## 2022-07-01 DIAGNOSIS — G8929 Other chronic pain: Secondary | ICD-10-CM | POA: Diagnosis not present

## 2022-07-01 DIAGNOSIS — Z79899 Other long term (current) drug therapy: Secondary | ICD-10-CM | POA: Diagnosis not present

## 2022-07-01 DIAGNOSIS — M25552 Pain in left hip: Secondary | ICD-10-CM

## 2022-07-01 DIAGNOSIS — R030 Elevated blood-pressure reading, without diagnosis of hypertension: Secondary | ICD-10-CM | POA: Diagnosis not present

## 2022-07-01 DIAGNOSIS — Z681 Body mass index (BMI) 19 or less, adult: Secondary | ICD-10-CM | POA: Diagnosis not present

## 2022-07-01 DIAGNOSIS — M25551 Pain in right hip: Secondary | ICD-10-CM

## 2022-07-01 DIAGNOSIS — M545 Low back pain, unspecified: Secondary | ICD-10-CM | POA: Diagnosis not present

## 2022-07-02 ENCOUNTER — Other Ambulatory Visit: Payer: Medicare Other

## 2022-07-02 ENCOUNTER — Ambulatory Visit (INDEPENDENT_AMBULATORY_CARE_PROVIDER_SITE_OTHER): Payer: Medicare Other | Admitting: Orthopaedic Surgery

## 2022-07-02 DIAGNOSIS — M67951 Unspecified disorder of synovium and tendon, right thigh: Secondary | ICD-10-CM

## 2022-07-02 DIAGNOSIS — M67952 Unspecified disorder of synovium and tendon, left thigh: Secondary | ICD-10-CM | POA: Diagnosis not present

## 2022-07-02 DIAGNOSIS — M25552 Pain in left hip: Secondary | ICD-10-CM

## 2022-07-02 DIAGNOSIS — M25551 Pain in right hip: Secondary | ICD-10-CM | POA: Diagnosis not present

## 2022-07-02 DIAGNOSIS — M67959 Unspecified disorder of synovium and tendon, unspecified thigh: Secondary | ICD-10-CM

## 2022-07-02 MED ORDER — TRIAMCINOLONE ACETONIDE 40 MG/ML IJ SUSP
80.0000 mg | INTRAMUSCULAR | Status: AC | PRN
  Administered 2022-07-02: 80 mg via INTRA_ARTICULAR

## 2022-07-02 MED ORDER — LIDOCAINE HCL 1 % IJ SOLN
4.0000 mL | INTRAMUSCULAR | Status: AC | PRN
  Administered 2022-07-02: 4 mL

## 2022-07-02 NOTE — Progress Notes (Signed)
Chief Complaint: Legs and hip pain     History of Present Illness:    Patricia Underwood is a 71 y.o. female presents today with ongoing bilateral hip pain.  She does have a history of rheumatoid arthritis and this pain is lasted for several years but has been worse in the last several weeks.  She does have a history of an L4-L5 fusion which she 7 years prior.  She uses a cane more recently but this is not her baseline.  She is having a very difficult time getting out of bed.  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  She presents here today for further assessment.    Surgical History:   none  PMH/PSH/Family History/Social History/Meds/Allergies:    Past Medical History:  Diagnosis Date  . Aortic atherosclerosis ([place])   . B12 deficiency, Rx B12 injections 07/05/2017  . Chronic back pain   . Chronic right shoulder pain 05/30/2017  . Coronary artery calcification seen on CAT scan   . Esophageal spasm   . Fibromyalgia   . HLD (hyperlipidemia) 05/30/2017   Past Surgical History:  Procedure Laterality Date  . ABDOMINAL HYSTERECTOMY    . KNEE SURGERY Right   . LUMBAR FUSION     L4-L5  . SHOULDER ARTHROSCOPY Right    Social History   Socioeconomic History  . Marital status: Married    Spouse name: Not on file  . Number of children: Not on file  . Years of education: Not on file  . Highest education level: Not on file  Occupational History  . Occupation: Retired   Tobacco Use  . Smoking status: Former  . Smokeless tobacco: Never  . Tobacco comments:    quit in [place] 2021  Substance and Sexual Activity  . Alcohol use: Yes    Alcohol/week: 7.0 standard drinks of alcohol    Types: 7 Glasses of wine per week  . Drug use: No  . Sexual activity: Not Currently    Partners: Male    Birth control/protection: None  Other Topics  Concern  . Not on file  Social History Narrative  . Not on file   Social Determinants of Health   Financial Resource Strain: Low Risk  (10/22/2021)   Overall Financial Resource Strain (CARDIA)   . Difficulty of Paying Living Expenses: Not hard at all  Food Insecurity: No Food Insecurity (10/22/2021)   Hunger Vital Sign   . Worried About Charity fundraiser in the Last Year: Never true   . Ran Out of Food in the Last Year: Never true  Transportation Needs: No Transportation Needs (10/22/2021)   PRAPARE - Transportation   . Lack of Transportation (Medical): No   . Lack of Transportation (Non-Medical): No  Physical Activity: Sufficiently Active (10/22/2021)   Exercise Vital Sign   . Days of Exercise per Week: 7 days   . Minutes of Exercise per Session: 30 min  Stress: No Stress Concern Present (10/22/2021)   [place]   . Feeling of Stress : Not at all  Social Connections: Moderately Isolated (10/22/2021)   Social Connection and Isolation Panel [NHANES]   . Frequency of  Communication with Friends and Family: More than three times a week   . Frequency of Social Gatherings with Friends and Family: More than three times a week   . Attends Religious Services: Never   . Active Member of Clubs or Organizations: No   . Attends Banker Meetings: Never   . Marital Status: Married   Family History  Problem Relation Age of Onset  . CAD Mother        died of massive MI at 62  . CVA Father        x 2  . Hyperlipidemia Sister   . Diabetes Neg Hx    Allergies  Allergen Reactions  . Ambien [Zolpidem Tartrate]   . Flexeril [Cyclobenzaprine] Other (See Comments)    Patient unsure of reaction  . Naproxen Other (See Comments)    Patient unsure of reaction.   Current Outpatient Medications  Medication Sig Dispense Refill  . atorvastatin (LIPITOR) 20 MG tablet Take by mouth.    . cyanocobalamin (,VITAMIN  B-12,) 1000 MCG/ML injection 1000 mcg (1 mg) injection once every other week 6 mL 3  . predniSONE (DELTASONE) 20 MG tablet Take 1 tablet (20 mg total) by mouth daily with breakfast. 14 tablet 0  . tiZANidine (ZANAFLEX) 4 MG tablet Take 1 tablet (4 mg total) by mouth 3 (three) times daily. 90 tablet 0  . triazolam (HALCION) 0.25 MG tablet Take 1 tablet (0.25 mg total) by mouth at bedtime as needed. for sleep 30 tablet 5  . Vitamin D, Ergocalciferol, (DRISDOL) 1.25 MG (50000 UNIT) CAPS capsule Take 1 capsule (50,000 Units total) by mouth every 7 (seven) days. 12 capsule 0   No current facility-administered medications for this visit.   DG HIPS BILAT W OR W/O PELVIS MIN 5 VIEWS  Result Date: 07/02/2022 CLINICAL DATA:  Bilateral hip pain for 2 weeks without known injury. EXAM: DG HIP (WITH OR WITHOUT PELVIS) 5+V BILAT COMPARISON:  None Available. FINDINGS: There is no evidence of hip fracture or dislocation. No significant joint space narrowing is noted. Mild osteophyte formation is seen involving both hips. IMPRESSION: Mild degenerative changes are seen involving both hips. No acute abnormality seen. Electronically Signed   By: Lupita Raider M.D.   On: 07/02/2022 14:00    Review of Systems:   A ROS was performed including pertinent positives and negatives as documented in the HPI.  Physical Exam :   Constitutional: NAD and appears stated age Neurological: Alert and oriented Psych: Appropriate affect and cooperative There were no vitals taken for this visit.   Comprehensive Musculoskeletal Exam:    Inspection Right Left  Skin No atrophy or gross abnormalities appreciated No atrophy or gross abnormalities appreciated  Palpation    Tenderness None None  Crepitus None None  Range of Motion    Flexion (passive) 120 120  Extension 30 30  IR 30 30  ER 45 45  Strength    Flexion  5/5 5/5  Extension 5/5 5/5  Special Tests    FABER Negative Negative  FADIR Negative Negative  ER  Lag/Capsular Insufficiency Negative Negative  Instability Negative Negative  Sacroiliac pain Negative  Negative   Instability    Generalized Laxity No No  Neurologic    sciatic, femoral, obturator nerves intact to light sensation  Vascular/Lymphatic    DP pulse 2+ 2+  Lumbar Exam    Patient has symmetric lumbar range of motion with negative pain referral to hip   Weakness with resisted  hip abduction bilaterally  Imaging:   Xray (AP pelvis, 2 views right hip, 2 views left hip): normal  I personally reviewed and interpreted the radiographs.   Assessment:   71 y.o. female presents with evidence of gluteus medius tendinopathy bilaterally.  At this time for both diagnostic and therapeutic reasons I recommended ultrasound-guided injections of the gluteus medius.  She would like to proceed with this.  I will plan to see her back if she does not get definitive relief from this.  Plan :    -Bilateral gluteus medius injections performed after verbal consent obtained    Procedure Note  Patient: Patricia Underwood             Date of Birth: Oct 07, 1951           MRN: [id number]             Visit Date: 07/02/2022  Procedures: Visit Diagnoses: No diagnosis found.  Large Joint Inj: R greater trochanter on 07/02/2022 2:16 PM Indications: pain Details: 22 G 3.5 in needle, ultrasound-guided anterolateral approach  Arthrogram: No  Medications: 4 mL lidocaine 1 %; 80 mg triamcinolone acetonide 40 MG/ML Outcome: tolerated well, no immediate complications Procedure, treatment alternatives, risks and benefits explained, specific risks discussed. Consent was given by the patient. Immediately prior to procedure a time out was called to verify the correct patient, procedure, equipment, support staff and site/side marked as required. Patient was prepped and draped in the usual sterile fashion.    Large Joint Inj: L greater trochanter on 07/02/2022 2:16 PM Indications: pain Details: 22 G 3.5 in needle,  ultrasound-guided anterolateral approach  Arthrogram: No  Medications: 4 mL lidocaine 1 %; 80 mg triamcinolone acetonide 40 MG/ML Outcome: tolerated well, no immediate complications Procedure, treatment alternatives, risks and benefits explained, specific risks discussed. Consent was given by the patient. Immediately prior to procedure a time out was called to verify the correct patient, procedure, equipment, support staff and site/side marked as required. Patient was prepped and draped in the usual sterile fashion.          I personally saw and evaluated the patient, and participated in the management and treatment plan.  Huel Cote, MD Attending Physician, Orthopedic Surgery  This document was dictated using Dragon voice recognition software. A reasonable attempt at proof reading has been made to minimize errors.

## 2022-07-03 ENCOUNTER — Other Ambulatory Visit (INDEPENDENT_AMBULATORY_CARE_PROVIDER_SITE_OTHER): Payer: Medicare Other

## 2022-07-03 ENCOUNTER — Other Ambulatory Visit: Payer: Self-pay | Admitting: *Deleted

## 2022-07-03 DIAGNOSIS — E559 Vitamin D deficiency, unspecified: Secondary | ICD-10-CM

## 2022-07-03 DIAGNOSIS — E538 Deficiency of other specified B group vitamins: Secondary | ICD-10-CM

## 2022-07-04 ENCOUNTER — Encounter (HOSPITAL_BASED_OUTPATIENT_CLINIC_OR_DEPARTMENT_OTHER): Payer: Self-pay | Admitting: Orthopaedic Surgery

## 2022-07-04 LAB — VITAMIN D 25 HYDROXY (VIT D DEFICIENCY, FRACTURES): VITD: 49.61 ng/mL (ref 30.00–100.00)

## 2022-07-04 LAB — VITAMIN B12: Vitamin B-12: 334 pg/mL (ref 211–911)

## 2022-07-04 NOTE — Progress Notes (Signed)
Please inform patient of the following:  X-ray shows that she has arthritis in both of her hips.  No fractures or other significant findings.  She should follow-up with orthopedics for further management.

## 2022-07-08 ENCOUNTER — Ambulatory Visit (INDEPENDENT_AMBULATORY_CARE_PROVIDER_SITE_OTHER): Payer: Medicare Other | Admitting: Family Medicine

## 2022-07-08 ENCOUNTER — Encounter: Payer: Self-pay | Admitting: Family Medicine

## 2022-07-08 VITALS — BP 169/83 | HR 89 | Temp 97.6°F | Ht 63.0 in | Wt 99.6 lb

## 2022-07-08 DIAGNOSIS — M5440 Lumbago with sciatica, unspecified side: Secondary | ICD-10-CM | POA: Diagnosis not present

## 2022-07-08 DIAGNOSIS — E559 Vitamin D deficiency, unspecified: Secondary | ICD-10-CM

## 2022-07-08 DIAGNOSIS — E538 Deficiency of other specified B group vitamins: Secondary | ICD-10-CM

## 2022-07-08 DIAGNOSIS — M0609 Rheumatoid arthritis without rheumatoid factor, multiple sites: Secondary | ICD-10-CM

## 2022-07-08 DIAGNOSIS — G8929 Other chronic pain: Secondary | ICD-10-CM

## 2022-07-08 MED ORDER — CYANOCOBALAMIN 1000 MCG/ML IJ SOLN: INTRAMUSCULAR | 3 refills | Status: AC

## 2022-07-08 MED ORDER — PREDNISONE 20 MG PO TABS: 20.0000 mg | ORAL_TABLET | Freq: Every day | ORAL | 0 refills | Status: DC

## 2022-07-08 MED ORDER — VITAMIN D (ERGOCALCIFEROL) 1.25 MG (50000 UNIT) PO CAPS
50000.0000 [IU] | ORAL_CAPSULE | ORAL | 3 refills | Status: DC
Start: 2022-07-08 — End: 2023-06-05

## 2022-07-08 NOTE — Patient Instructions (Signed)
It was very nice to see you today!  I will refill the prednisone.  Refill your vitamin D and B12 as well.  Please follow-up with orthopedist and rheumatologist soon.  Take care, Dr Jimmey Ralph  PLEASE NOTE:  If you had any lab tests please let us know if you have not heard back within a few days. You may see your results on mychart before we have a chance to review them but we will give you a call once they are reviewed by [place]. If we ordered any referrals today, please let us know if you have not heard from their office within the next week.   Please try these tips to maintain a healthy lifestyle:  Eat at least 3 REAL meals and 1-2 snacks per day.  Aim for no more than 5 hours between eating.  If you eat breakfast, please do so within one hour of getting up.   Each meal should contain half fruits/vegetables, one quarter protein, and one quarter carbs (no bigger than a computer mouse)  Cut down on sweet beverages. This includes juice, soda, and sweet tea.   Drink at least 1 glass of water with each meal and aim for at least 8 glasses per day  Exercise at least 150 minutes every week.

## 2022-07-08 NOTE — Assessment & Plan Note (Signed)
Continue management per orthopedics.  She may be considering steroid injection at some point in the future.

## 2022-07-08 NOTE — Progress Notes (Signed)
   Latyra Jaye is a 71 y.o. female who presents today for an office visit.  Assessment/Plan:  Chronic Problems Addressed Today: Rheumatoid arthritis (HCC) Currently on prednisone daily until she can get into see rheumatologist.  She has an upcoming appointment in a few weeks.  We will send in refill today.  She will need to get further refills from them  Chronic low back pain with sciatica Continue management per orthopedics.  She may be considering steroid injection at some point in the future.  B12 deficiency, Rx B12 injections B12 on the low side of normal.  We will continue B12 supplementation 1000 mcg IM every other week.  Vitamin D deficiency Vitamin D at goal though she typically runs low during the winter months.  We will continue 50,000 IUs once weekly.  Recheck in 6 to 12 months.     Subjective:  HPI:  See a/p for status of chronic conditions.         Objective:  Physical Exam: BP (!) 177/76   Pulse 89   Temp 97.6 F (36.4 C) (Temporal)   Ht 5\' 3"  (1.6 m)   Wt 99 lb 9.6 oz (45.2 kg)   SpO2 98%   BMI 17.64 kg/m   Gen: No acute distress, resting comfortably Neuro: Grossly normal, moves all extremities Psych: Normal affect and thought content       M. , MD 07/08/2022 8:28 AM

## 2022-07-08 NOTE — Assessment & Plan Note (Signed)
Currently on prednisone daily until she can get into see rheumatologist.  She has an upcoming appointment in a few weeks.  We will send in refill today.  She will need to get further refills from them

## 2022-07-08 NOTE — Assessment & Plan Note (Signed)
Vitamin D at goal though she typically runs low during the winter months.  We will continue 50,000 IUs once weekly.  Recheck in 6 to 12 months.

## 2022-07-08 NOTE — Assessment & Plan Note (Signed)
B12 on the low side of normal.  We will continue B12 supplementation 1000 mcg IM every other week.

## 2022-07-11 ENCOUNTER — Ambulatory Visit (INDEPENDENT_AMBULATORY_CARE_PROVIDER_SITE_OTHER): Payer: Medicare Other | Admitting: Orthopaedic Surgery

## 2022-07-11 DIAGNOSIS — M5459 Other low back pain: Secondary | ICD-10-CM | POA: Diagnosis not present

## 2022-07-11 DIAGNOSIS — M546 Pain in thoracic spine: Secondary | ICD-10-CM | POA: Diagnosis not present

## 2022-07-11 DIAGNOSIS — M545 Low back pain, unspecified: Secondary | ICD-10-CM

## 2022-07-11 NOTE — Progress Notes (Signed)
Chief Complaint: Legs and hip pain     History of Present Illness:   07/11/2022: Presents today for follow-up of her back.  She states that she is experiencing significant pain and weakness in the mid back at the thoracolumbar junction.  This is worse with any type of forward flexion.  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  Patricia Underwood is a 71 y.o. female presents today with ongoing bilateral hip pain.  She does have a history of rheumatoid arthritis and this pain is lasted for several years but has been worse in the last several weeks.  She does have a history of an L4-L5 fusion which she 7 years prior.  She uses a cane more recently but this is not her baseline.  She is having a very difficult time getting out of bed.  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  She presents here today for further assessment.    Surgical History:   none  PMH/PSH/Family History/Social History/Meds/Allergies:    Past Medical History:  Diagnosis Date   Aortic atherosclerosis (HCC)    B12 deficiency, Rx B12 injections 07/05/2017   Chronic back pain    Chronic right shoulder pain 05/30/2017   Coronary artery calcification seen on CAT scan    Esophageal spasm    Fibromyalgia    HLD (hyperlipidemia) 05/30/2017   Past Surgical History:  Procedure Laterality Date   ABDOMINAL HYSTERECTOMY     KNEE SURGERY Right    LUMBAR FUSION     L4-L5   SHOULDER ARTHROSCOPY Right    Social History   Socioeconomic History   Marital status: Married    Spouse name: Not on file   Number of children: Not on file   Years of education: Not on file   Highest education level: Not on file  Occupational History   Occupation: Retired   Tobacco Use   Smoking status: Former   Smokeless tobacco: Never    Tobacco comments:    quit in [place] 2021  Substance and Sexual Activity   Alcohol use: Yes    Alcohol/week: 7.0 standard drinks of alcohol    Types: 7 Glasses of wine per week   Drug use: No   Sexual activity: Not Currently    Partners: Male    Birth control/protection: None  Other Topics Concern   Not on file  Social History Narrative   Not on file   Social Determinants of Health   Financial Resource Strain: Low Risk  (10/22/2021)   Overall Financial Resource Strain (CARDIA)    Difficulty of Paying Living Expenses: Not hard at all  Food Insecurity: No Food Insecurity (10/22/2021)   Hunger Vital Sign    Worried About Running Out of Food in the Last Year: Never true    Ran Out of Food in the Last Year: Never true  Transportation Needs: No Transportation Needs (10/22/2021)   PRAPARE - Administrator, Civil Service (Medical): No    Lack of Transportation (Non-Medical): No  Physical Activity: Sufficiently Active (10/22/2021)   Exercise Vital Sign    Days of Exercise per Week: 7  days    Minutes of Exercise per Session: 30 min  Stress: No Stress Concern Present (10/22/2021)   [employer] of Occupational Health - Occupational Stress Questionnaire    Feeling of Stress : Not at all  Social Connections: Moderately Isolated (10/22/2021)   Social Connection and Isolation Panel [NHANES]    Frequency of Communication with Friends and Family: More than three times a week    Frequency of Social Gatherings with Friends and Family: More than three times a week    Attends Religious Services: Never    Database administrator or Organizations: No    Attends Engineer, structural: Never    Marital Status: Married   Family History  Problem Relation Age of Onset   CAD Mother        died of massive MI at 79   CVA Father        x 2   Hyperlipidemia Sister    Diabetes Neg Hx    Allergies  Allergen Reactions   Ambien [Zolpidem Tartrate]    Flexeril  [Cyclobenzaprine] Other (See Comments)    Patient unsure of reaction   Naproxen Other (See Comments)    Patient unsure of reaction.   Current Outpatient Medications  Medication Sig Dispense Refill   atorvastatin (LIPITOR) 20 MG tablet Take by mouth.     cyanocobalamin (VITAMIN B12) 1000 MCG/ML injection 1000 mcg (1 mg) injection once every other week 6 mL 3   predniSONE (DELTASONE) 20 MG tablet Take 1 tablet (20 mg total) by mouth daily with breakfast. 21 tablet 0   tiZANidine (ZANAFLEX) 4 MG tablet Take 1 tablet (4 mg total) by mouth 3 (three) times daily. 90 tablet 0   triazolam (HALCION) 0.25 MG tablet Take 1 tablet (0.25 mg total) by mouth at bedtime as needed. for sleep 30 tablet 5   Vitamin D, Ergocalciferol, (DRISDOL) 1.25 MG (50000 UNIT) CAPS capsule Take 1 capsule (50,000 Units total) by mouth every 7 (seven) days. 12 capsule 3   No current facility-administered medications for this visit.   No results found.  Review of Systems:   A ROS was performed including pertinent positives and negatives as documented in the HPI.  Physical Exam :   Constitutional: NAD and appears stated age Neurological: Alert and oriented Psych: Appropriate affect and cooperative There were no vitals taken for this visit.   Comprehensive Musculoskeletal Exam:    Inspection Right Left  Skin No atrophy or gross abnormalities appreciated No atrophy or gross abnormalities appreciated  Palpation    Tenderness None None  Crepitus None None  Range of Motion    Flexion (passive) 120 120  Extension 30 30  IR 30 30  ER 45 45  Strength    Flexion  5/5 5/5  Extension 5/5 5/5  Special Tests    FABER Negative Negative  FADIR Negative Negative  ER Lag/Capsular Insufficiency Negative Negative  Instability Negative Negative  Sacroiliac pain Negative  Negative   Instability    Generalized Laxity No No  Neurologic    sciatic, femoral, obturator nerves intact to light sensation  Vascular/Lymphatic     DP pulse 2+ 2+  Lumbar Exam    Patient has symmetric lumbar range of motion with negative pain referral to hip   There is significant positive sagittal balance.  She has tenderness about the thoracolumbar junction Imaging:   Xray (AP pelvis, 2 views right hip, 2 views left hip): normal  I personally reviewed and interpreted the  radiographs.   Assessment:   71 y.o. female presents with positive sagittal balance and continued thoracolumbar junction pain.  I did discuss that this is likely result of having previous spinal fusion and significant positive sagittal imbalance.  To that effect I do believe that she would benefit from ongoing discussion with her spinal surgeon regarding this.  The meantime I recommended a thoracolumbar type brace to help give her some support.  Plan :    -Return to clinic as necessary      I personally saw and evaluated the patient, and participated in the management and treatment plan.  Huel Cote, MD Attending Physician, Orthopedic Surgery  This document was dictated using Dragon voice recognition software. A reasonable attempt at proof reading has been made to minimize errors.

## 2022-07-17 DIAGNOSIS — M2550 Pain in unspecified joint: Secondary | ICD-10-CM | POA: Diagnosis not present

## 2022-07-17 DIAGNOSIS — Z7952 Long term (current) use of systemic steroids: Secondary | ICD-10-CM | POA: Diagnosis not present

## 2022-07-17 DIAGNOSIS — Z79899 Other long term (current) drug therapy: Secondary | ICD-10-CM | POA: Diagnosis not present

## 2022-07-28 ENCOUNTER — Ambulatory Visit (INDEPENDENT_AMBULATORY_CARE_PROVIDER_SITE_OTHER): Payer: Medicare Other | Admitting: Family Medicine

## 2022-07-28 ENCOUNTER — Telehealth: Payer: Self-pay | Admitting: Family Medicine

## 2022-07-28 ENCOUNTER — Encounter: Payer: Self-pay | Admitting: Family Medicine

## 2022-07-28 VITALS — BP 163/103 | HR 123 | Temp 97.8°F | Ht 63.0 in | Wt 97.4 lb

## 2022-07-28 DIAGNOSIS — F5102 Adjustment insomnia: Secondary | ICD-10-CM | POA: Diagnosis not present

## 2022-07-28 DIAGNOSIS — G8929 Other chronic pain: Secondary | ICD-10-CM

## 2022-07-28 DIAGNOSIS — M5440 Lumbago with sciatica, unspecified side: Secondary | ICD-10-CM | POA: Diagnosis not present

## 2022-07-28 DIAGNOSIS — M0609 Rheumatoid arthritis without rheumatoid factor, multiple sites: Secondary | ICD-10-CM

## 2022-07-28 MED ORDER — PREDNISONE 20 MG PO TABS
20.0000 mg | ORAL_TABLET | Freq: Every day | ORAL | 0 refills | Status: DC
Start: 2022-07-28 — End: 2022-08-14

## 2022-07-28 MED ORDER — OXYCODONE-ACETAMINOPHEN 5-325 MG PO TABS: 1.0000 | ORAL_TABLET | ORAL | 0 refills | Status: DC | PRN

## 2022-07-28 NOTE — Assessment & Plan Note (Signed)
We will give 1 more fill of prednisone.  She does not wish to be on chronic steroids after coming back from her trip.  She will continue management per rheumatology otherwise.

## 2022-07-28 NOTE — Progress Notes (Signed)
   Patricia Underwood is a 71 y.o. female who presents today for an office visit.  Assessment/Plan:  Chronic Problems Addressed Today: Chronic low back pain with sciatica No red flags.  Continue management per orthopedics.  She will be seeing a back specialist in a few weeks.  Drug database was reviewed without any red flags.  She has been previously following with pain clinic but however has not been prescribing narcotics for the last couple of months.  She does not wish to be on chronic narcotics any longer.   We will send in small supply of oxycodone to use as needed during her upcoming trip.  We will also refill her prednisone.  She will follow-up with orthopedics soon as above.  Will place referral to PT as well.   Insomnia Stable on Halcion as needed.  She will not be using this on her trip.  She is aware not to mix this with any narcotics.   Rheumatoid arthritis ([place]) We will give 1 more fill of prednisone.  She does not wish to be on chronic steroids after coming back from her trip.  She will continue management per rheumatology otherwise.     Subjective:  HPI:  See A/p for status of chronic conditions.    She is here today with concerns for bilateral hip pain and back pain.  She has seen orthopedics a couple times over the last couple of weeks for this.  She did have a corticosteroid injection performed about 4 weeks ago which helped however the symptoms have returned.  She will be going on a trip to [place] soon and request pain medications on her trip.  She has been on oxycodone in the past and has done well with this.  She does not want to do this is a long-term thing but instead to have something on hand in case symptoms flareup.  She will be following up with orthopedics again for her back in a few weeks.       Objective:  Physical Exam: BP (!) 180/111   Pulse (!) 123   Temp 97.8 F (36.6 C) (Temporal)   Ht 5\' 3"  (1.6 m)   Wt 97 lb 6.4 oz (44.2 kg)   SpO2 98%   BMI 17.25  kg/m   Gen: No acute distress, resting comfortably  Neuro: Grossly normal, moves all extremities Psych: Normal affect and thought content       M. Jerline Pain, MD 07/28/2022 1:33 PM

## 2022-07-28 NOTE — Assessment & Plan Note (Addendum)
No red flags.  Continue management per orthopedics.  She will be seeing a back specialist in a few weeks.  Drug database was reviewed without any red flags.  She has been previously following with pain clinic but however has not been prescribing narcotics for the last couple of months.  She does not wish to be on chronic narcotics any longer.   We will send in small supply of oxycodone to use as needed during her upcoming trip.  We will also refill her prednisone.  She will follow-up with orthopedics soon as above.  Will place referral to PT as well.

## 2022-07-28 NOTE — Assessment & Plan Note (Signed)
Stable on Halcion as needed.  She will not be using this on her trip.  She is aware not to mix this with any narcotics.

## 2022-07-28 NOTE — Patient Instructions (Signed)
It was very nice to see you today!  I will send in more prednisone and pain meds.  We will refer you to see a physical therapist as well.  Please let me know in a few weeks how you are doing.  Take care, Dr Jerline Pain  PLEASE NOTE:  If you had any lab tests please let us know if you have not heard back within a few days. You may see your results on mychart before we have a chance to review them but we will give you a call once they are reviewed by [place]. If we ordered any referrals today, please let us know if you have not heard from their office within the next week.   Please try these tips to maintain a healthy lifestyle:  Eat at least 3 REAL meals and 1-2 snacks per day.  Aim for no more than 5 hours between eating.  If you eat breakfast, please do so within one hour of getting up.   Each meal should contain half fruits/vegetables, one quarter protein, and one quarter carbs (no bigger than a computer mouse)  Cut down on sweet beverages. This includes juice, soda, and sweet tea.   Drink at least 1 glass of water with each meal and aim for at least 8 glasses per day  Exercise at least 150 minutes every week.

## 2022-07-28 NOTE — Telephone Encounter (Signed)
Patient states: -She forgot to ask a question during her OV with PCP on 09/18 - It is a personal question that she didn't wan to ask anyone other than Junious Dresser  Patient requests: -A callback from [facility] at [phone] when she is able

## 2022-07-29 NOTE — Telephone Encounter (Signed)
Patient calling re medication - patient leaving to [facility] . -

## 2022-07-30 ENCOUNTER — Encounter: Payer: Self-pay | Admitting: Family Medicine

## 2022-07-30 NOTE — Telephone Encounter (Signed)
Spoke with patient today  Stated going out of stated and requesting Rx oxycodone

## 2022-07-31 ENCOUNTER — Ambulatory Visit (INDEPENDENT_AMBULATORY_CARE_PROVIDER_SITE_OTHER): Payer: Medicare Other | Admitting: Family Medicine

## 2022-07-31 ENCOUNTER — Encounter: Payer: Self-pay | Admitting: Family Medicine

## 2022-07-31 VITALS — BP 179/94 | HR 104 | Temp 98.1°F | Ht 63.0 in | Wt 97.0 lb

## 2022-07-31 DIAGNOSIS — M5440 Lumbago with sciatica, unspecified side: Secondary | ICD-10-CM

## 2022-07-31 DIAGNOSIS — M0609 Rheumatoid arthritis without rheumatoid factor, multiple sites: Secondary | ICD-10-CM

## 2022-07-31 DIAGNOSIS — G8929 Other chronic pain: Secondary | ICD-10-CM | POA: Diagnosis not present

## 2022-07-31 MED ORDER — OXYCODONE-ACETAMINOPHEN 5-325 MG PO TABS: 1.0000 | ORAL_TABLET | ORAL | 0 refills | Status: DC | PRN

## 2022-07-31 NOTE — Progress Notes (Signed)
   Patricia Underwood is a 71 y.o. female who presents today for an office visit.  Assessment/Plan:  Chronic Problems Addressed Today: Chronic low back pain with sciatica No red flags.  We saw her for this 3 days ago.  Drug database was reviewed without red flags.  We sent in a small supply of oxycodone to use as needed for her upcoming trip.  She will be gone for a couple of weeks and thinks that she may need a refill before going.  We will send in another small supply.  Discussed with patient she would likely not be able to get this filled sooner than 5 days after initial prescription due to the  stop act.  She is aware this is not to be an ongoing prescription.  She does not wish to be on chronic narcotics any longer.  She will be following up with orthopedics when she comes back from her trip next week.  Rheumatoid arthritis ([place]) Continue management per rheumatology.    Subjective:  HPI:  See A/p for status of chronic conditions.         Objective:  Physical Exam: BP (!) 179/94   Pulse (!) 104   Temp 98.1 F (36.7 C) (Temporal)   Ht 5\' 3"  (1.6 m)   Wt 97 lb (44 kg)   SpO2 98%   BMI 17.18 kg/m   Gen: No acute distress, resting comfortably Neuro: Grossly normal, moves all extremities Psych: Normal affect and thought content       M. Jerline Pain, MD 07/31/2022 12:40 PM

## 2022-07-31 NOTE — Assessment & Plan Note (Signed)
No red flags.  We saw her for this 3 days ago.  Drug database was reviewed without red flags.  We sent in a small supply of oxycodone to use as needed for her upcoming trip.  She will be gone for a couple of weeks and thinks that she may need a refill before going.  We will send in another small supply.  Discussed with patient she would likely not be able to get this filled sooner than 5 days after initial prescription due to the  stop act.  She is aware this is not to be an ongoing prescription.  She does not wish to be on chronic narcotics any longer.  She will be following up with orthopedics when she comes back from her trip next week.

## 2022-07-31 NOTE — Patient Instructions (Signed)
It was very nice to see you today!  We will send medicine into the pharmacy.  Keep an eye on your blood pressure.  Let me know if persistently elevated.  Take care, Dr Jerline Pain  PLEASE NOTE:  If you had any lab tests please let us know if you have not heard back within a few days. You may see your results on mychart before we have a chance to review them but we will give you a call once they are reviewed by [place]. If we ordered any referrals today, please let us know if you have not heard from their office within the next week.   Please try these tips to maintain a healthy lifestyle:  Eat at least 3 REAL meals and 1-2 snacks per day.  Aim for no more than 5 hours between eating.  If you eat breakfast, please do so within one hour of getting up.   Each meal should contain half fruits/vegetables, one quarter protein, and one quarter carbs (no bigger than a computer mouse)  Cut down on sweet beverages. This includes juice, soda, and sweet tea.   Drink at least 1 glass of water with each meal and aim for at least 8 glasses per day  Exercise at least 150 minutes every week.

## 2022-07-31 NOTE — Assessment & Plan Note (Signed)
Continue management per rheumatology. 

## 2022-08-14 ENCOUNTER — Ambulatory Visit (INDEPENDENT_AMBULATORY_CARE_PROVIDER_SITE_OTHER): Payer: Medicare Other | Admitting: Family Medicine

## 2022-08-14 ENCOUNTER — Encounter: Payer: Self-pay | Admitting: Family Medicine

## 2022-08-14 VITALS — BP 124/73 | HR 91 | Temp 98.2°F | Ht 63.0 in | Wt 106.2 lb

## 2022-08-14 DIAGNOSIS — G8929 Other chronic pain: Secondary | ICD-10-CM | POA: Diagnosis not present

## 2022-08-14 DIAGNOSIS — I10 Essential (primary) hypertension: Secondary | ICD-10-CM

## 2022-08-14 DIAGNOSIS — M5440 Lumbago with sciatica, unspecified side: Secondary | ICD-10-CM | POA: Diagnosis not present

## 2022-08-14 DIAGNOSIS — M0609 Rheumatoid arthritis without rheumatoid factor, multiple sites: Secondary | ICD-10-CM | POA: Diagnosis not present

## 2022-08-14 MED ORDER — KETOROLAC TROMETHAMINE 10 MG PO TABS: 10.0000 mg | ORAL_TABLET | Freq: Four times a day (QID) | ORAL | 0 refills | Status: DC | PRN

## 2022-08-14 MED ORDER — AMLODIPINE BESYLATE 5 MG PO TABS: 5.0000 mg | ORAL_TABLET | Freq: Every day | ORAL | 3 refills | Status: DC

## 2022-08-14 MED ORDER — KETOROLAC TROMETHAMINE 60 MG/2ML IM SOLN
60.0000 mg | Freq: Once | INTRAMUSCULAR | Status: AC
  Administered 2022-08-14: 60 mg via INTRAMUSCULAR

## 2022-08-14 NOTE — Progress Notes (Signed)
   Patricia Underwood is a 72 y.o. female who presents today for an office visit.  Assessment/Plan:  Chronic Problems Addressed Today: Chronic low back pain with sciatica No red flags though she has had significantly worsening pain over the last couple of days.  May have flared up during her trip to [facility].  We discussed that oxycodone should not be a long-term prescription.  She is no longer on prednisone either.  She does have an upcoming appoint with neurosurgery in a few weeks.  We will give 60 mg of Toradol today for her acute flare and continue oral Toradol for the next several days afterwards.  I will defer further management to neurosurgery.  Essential hypertension Elevated today and its been elevated the last few office visits.  May be elevated due to her worsening low back pain.  She had previously been well controlled until a couple of months ago.  We discussed treatment options.  We will start low-dose amlodipine 5 mg daily until we can get her pain under better control.  Potentially could be possible to discontinue this medication at some point in the future once her pain is better controlled.  Rheumatoid arthritis ([place]) She has seen rheumatology in the past.  Not currently on any medications for this.  Has previously been recommended to start hydroxychloroquine but is not currently on any DMARDs.  It is believed that some of her joint pain could be related to this.  We will place referral for her to establish care with rheumatologist in [place].     Subjective:  HPI:  See a/p for status of chronic conditions.   We will saw her a few weeks ago.  Since her last visit she has significantly worsening pain in both of her legs.  Pain comes and goes.  Significantly worsened a couple of days ago.  She recently returned from a trip to [facility] where she was visiting family.  She is no longer taking the prednisone or oxycodone that was prescribed to her a few weeks ago.  She does have an  upcoming appointment with neurosurgeon later this month to discuss her chronic back pain.  Pain comes and goes.  Seems to be improving in the last couple of days.  Describes a sharp pain in both of her legs.  Worse with certain motions.  She has had difficulty with walking due to the pain.       Objective:  Physical Exam: BP (!) 164/85   Pulse 91   Temp 98.2 F (36.8 C) (Temporal)   Ht 5\' 3"  (1.6 m)   Wt 106 lb 3.2 oz (48.2 kg)   SpO2 96%   BMI 18.81 kg/m   Gen: No acute distress, resting comfortably Neuro: Grossly normal, moves all extremities Psych: Normal affect and thought content       M. Jerline Pain, MD 08/14/2022 1:32 PM

## 2022-08-14 NOTE — Assessment & Plan Note (Signed)
No red flags though she has had significantly worsening pain over the last couple of days.  May have flared up during her trip to [facility].  We discussed that oxycodone should not be a long-term prescription.  She is no longer on prednisone either.  She does have an upcoming appoint with neurosurgery in a few weeks.  We will give 60 mg of Toradol today for her acute flare and continue oral Toradol for the next several days afterwards.  I will defer further management to neurosurgery.

## 2022-08-14 NOTE — Assessment & Plan Note (Signed)
Elevated today and its been elevated the last few office visits.  May be elevated due to her worsening low back pain.  She had previously been well controlled until a couple of months ago.  We discussed treatment options.  We will start low-dose amlodipine 5 mg daily until we can get her pain under better control.  Potentially could be possible to discontinue this medication at some point in the future once her pain is better controlled.

## 2022-08-14 NOTE — Addendum Note (Signed)
Addended by: Betti Cruz on: 08/14/2022 01:38 PM   Modules accepted: Orders

## 2022-08-14 NOTE — Patient Instructions (Addendum)
It was very nice to see you today!  We will give you an injection of Toradol today.  Please start the Toradol till tomorrow.  Your blood pressure is high today.  It is probably high due to your pain however we should treat this at least temporarily with a blood pressure pill.  Please start amlodipine 5 mg daily.  We will refer you to rheumatology.  Take care, Dr Jerline Pain  PLEASE NOTE:  If you had any lab tests please let us know if you have not heard back within a few days. You may see your results on mychart before we have a chance to review them but we will give you a call once they are reviewed by [place]. If we ordered any referrals today, please let us know if you have not heard from their office within the next week.   Please try these tips to maintain a healthy lifestyle:  Eat at least 3 REAL meals and 1-2 snacks per day.  Aim for no more than 5 hours between eating.  If you eat breakfast, please do so within one hour of getting up.   Each meal should contain half fruits/vegetables, one quarter protein, and one quarter carbs (no bigger than a computer mouse)  Cut down on sweet beverages. This includes juice, soda, and sweet tea.   Drink at least 1 glass of water with each meal and aim for at least 8 glasses per day  Exercise at least 150 minutes every week.

## 2022-08-14 NOTE — Assessment & Plan Note (Signed)
She has seen rheumatology in the past.  Not currently on any medications for this.  Has previously been recommended to start hydroxychloroquine but is not currently on any DMARDs.  It is believed that some of her joint pain could be related to this.  We will place referral for her to establish care with rheumatologist in [place].

## 2022-08-18 ENCOUNTER — Telehealth: Payer: Self-pay | Admitting: Family Medicine

## 2022-08-18 ENCOUNTER — Encounter: Payer: Self-pay | Admitting: Family Medicine

## 2022-08-18 ENCOUNTER — Telehealth (INDEPENDENT_AMBULATORY_CARE_PROVIDER_SITE_OTHER): Payer: Medicare Other | Admitting: Family Medicine

## 2022-08-18 VITALS — Ht 63.0 in | Wt 106.0 lb

## 2022-08-18 DIAGNOSIS — M5440 Lumbago with sciatica, unspecified side: Secondary | ICD-10-CM | POA: Diagnosis not present

## 2022-08-18 DIAGNOSIS — G8929 Other chronic pain: Secondary | ICD-10-CM

## 2022-08-18 DIAGNOSIS — M0609 Rheumatoid arthritis without rheumatoid factor, multiple sites: Secondary | ICD-10-CM

## 2022-08-18 MED ORDER — OXYCODONE-ACETAMINOPHEN 5-325 MG PO TABS: 1.0000 | ORAL_TABLET | Freq: Three times a day (TID) | ORAL | 0 refills | Status: DC | PRN

## 2022-08-18 NOTE — Assessment & Plan Note (Signed)
No red flags.  She had modest improvement with Toradol though is having significant side effects and would like to make a change.  Discussed with patient that our treatment options are very limited at this point.  Would like to avoid prednisone given her heavy use over the last year or so.  Only other option at this point would be narcotics.  Discussed with patient that I can do this in the short-term but I would be unable to provide this 104 long-term supply.  She was agreeable to this.  We will send in a refill on her oxycodone.  She does have appointment with her neurosurgeon coming up in the next couple of weeks.  We discussed reasons to return to care.

## 2022-08-18 NOTE — Progress Notes (Signed)
   Patricia Underwood is a 71 y.o. female who presents today for a virtual office visit.  Assessment/Plan:  Chronic Problems Addressed Today: Chronic low back pain with sciatica No red flags.  She had modest improvement with Toradol though is having significant side effects and would like to make a change.  Discussed with patient that our treatment options are very limited at this point.  Would like to avoid prednisone given her heavy use over the last year or so.  Only other option at this point would be narcotics.  Discussed with patient that I can do this in the short-term but I would be unable to provide this 104 long-term supply.  She was agreeable to this.  We will send in a refill on her oxycodone.  She does have appointment with her neurosurgeon coming up in the next couple of weeks.  We discussed reasons to return to care.  Rheumatoid arthritis ([place]) We will continue pain management as above.  She would like to establish with a new rheumatologist.  Referral is pending for this.     Subjective:  HPI:  Patient here for back pain follow up. We saw her 4 days ago for a flare of her back pain. We started her on toradol. She does not think this has helped much and she is still having issues with side effects including sweating, fatigue, and nausea. She did notice some improvement in pain but does not want to continue due to her side effects.        Objective/Observations  Physical Exam: Gen: NAD, resting comfortably Pulm: Normal work of breathing Neuro: Grossly normal, moves all extremities Psych: Normal affect and thought content  Virtual Visit via Video   I connected with Jennesis Ramaswamy on 08/18/22 at  9:00 AM EDT by a video enabled telemedicine application and verified that I am speaking with the correct person using two identifiers. The limitations of evaluation and management by telemedicine and the availability of in person appointments were discussed. The patient expressed understanding  and agreed to proceed.   Patient location: Home Provider location: [place] participating in the virtual visit: Myself and Patient     Algis Greenhouse. Jerline Pain, MD 08/18/2022 9:58 AM

## 2022-08-18 NOTE — Assessment & Plan Note (Signed)
We will continue pain management as above.  She would like to establish with a new rheumatologist.  Referral is pending for this.

## 2022-08-20 DIAGNOSIS — M8589 Other specified disorders of bone density and structure, multiple sites: Secondary | ICD-10-CM | POA: Diagnosis not present

## 2022-08-20 DIAGNOSIS — M85852 Other specified disorders of bone density and structure, left thigh: Secondary | ICD-10-CM | POA: Diagnosis not present

## 2022-08-20 DIAGNOSIS — Z79899 Other long term (current) drug therapy: Secondary | ICD-10-CM | POA: Diagnosis not present

## 2022-08-20 DIAGNOSIS — Z7952 Long term (current) use of systemic steroids: Secondary | ICD-10-CM | POA: Diagnosis not present

## 2022-08-25 ENCOUNTER — Telehealth: Payer: Self-pay | Admitting: Family Medicine

## 2022-08-25 ENCOUNTER — Encounter: Payer: Self-pay | Admitting: Family Medicine

## 2022-08-25 NOTE — Telephone Encounter (Signed)
Patient states Pain medication (Oxycodone) is not working at all. Patient states she is not taking it anymore-states Prednisone works better.  Patient requests a RX for Prednisone be sent to: [facility] [id number] - [address], [place] Phone: [phone]  Fax: [phone]    Patient requests to be called at ph# [phone] for status of request.

## 2022-08-25 NOTE — Telephone Encounter (Signed)
Please adivse

## 2022-08-26 NOTE — Telephone Encounter (Signed)
Patient requests to be called at ph# [phone]  for status of Message request

## 2022-08-26 NOTE — Telephone Encounter (Signed)
Please advise 

## 2022-08-26 NOTE — Telephone Encounter (Signed)
Patient requests to be called at ph# Patient requests to be called at ph# [phone]  for RX request

## 2022-08-27 ENCOUNTER — Other Ambulatory Visit: Payer: Self-pay

## 2022-08-27 MED ORDER — PREDNISONE 20 MG PO TABS: 20.0000 mg | ORAL_TABLET | Freq: Every day | ORAL | 0 refills | Status: DC

## 2022-08-27 NOTE — Telephone Encounter (Signed)
Please see mychart message.  Patricia Underwood. Jerline Pain, MD 08/27/2022 10:20 AM

## 2022-08-27 NOTE — Telephone Encounter (Signed)
Prednisone 20 mg x 10 days sent to pharmacy and mychart message sent to patient

## 2022-08-27 NOTE — Telephone Encounter (Signed)
Ok to send in prednisone 20mg  daily x 10 days.  Algis Greenhouse. Jerline Pain, MD 08/27/2022 10:20 AM

## 2022-08-30 ENCOUNTER — Emergency Department (HOSPITAL_BASED_OUTPATIENT_CLINIC_OR_DEPARTMENT_OTHER): Payer: Medicare Other | Admitting: Radiology

## 2022-08-30 ENCOUNTER — Encounter (HOSPITAL_BASED_OUTPATIENT_CLINIC_OR_DEPARTMENT_OTHER): Payer: Self-pay

## 2022-08-30 ENCOUNTER — Emergency Department (HOSPITAL_BASED_OUTPATIENT_CLINIC_OR_DEPARTMENT_OTHER)
Admission: EM | Admit: 2022-08-30 | Discharge: 2022-08-30 | Disposition: A | Discharge status: Home / Self Care | Payer: Medicare Other | Attending: Emergency Medicine | Admitting: Emergency Medicine

## 2022-08-30 DIAGNOSIS — W19XXXA Unspecified fall, initial encounter: Secondary | ICD-10-CM | POA: Insufficient documentation

## 2022-08-30 DIAGNOSIS — S63502A Unspecified sprain of left wrist, initial encounter: Secondary | ICD-10-CM | POA: Insufficient documentation

## 2022-08-30 DIAGNOSIS — S6992XA Unspecified injury of left wrist, hand and finger(s), initial encounter: Secondary | ICD-10-CM | POA: Diagnosis present

## 2022-08-30 MED ORDER — HYDROCODONE-ACETAMINOPHEN 5-325 MG PO TABS: 1.0000 | ORAL_TABLET | Freq: Four times a day (QID) | ORAL | 0 refills | Status: DC | PRN

## 2022-08-30 NOTE — ED Triage Notes (Signed)
She reports losing her balance as she was attempting "to hand something" while standing on a step-stool. She c/o pain and swelling of left wrist. She states she is left-handed. She has no rings on either hand.

## 2022-08-30 NOTE — ED Provider Notes (Signed)
[facility] EMERGENCY DEPT Provider Note   CSN: [id number] Arrival date & time: 08/30/22  0800     History  Chief Complaint  Patient presents with   Wrist Pain    Patricia Underwood is a 71 y.o. female.  HPI Patient reports she was on a small stepladder yesterday doing some chores at home.  She lost her balance and fell onto her outstretched left hand and wrist.  Denies any other injury.  She reports she has gotten swelling in the left wrist now and is quite painful with certain movements.  Is also noted some bruising.  Patient is not on any blood thinners.  She has however recently been treated with steroids for back pain.    Home Medications Prior to Admission medications   Medication Sig Start Date End Date Taking? Authorizing Provider  HYDROcodone-acetaminophen (NORCO/VICODIN) 5-325 MG tablet Take 1 tablet by mouth every 6 (six) hours as needed for moderate pain or severe pain. 08/30/22  Yes Arby Barrette, MD  predniSONE (DELTASONE) 20 MG tablet Take 1 tablet (20 mg total) by mouth daily with breakfast. 08/27/22   Ardith Dark, MD  amLODipine (NORVASC) 5 MG tablet Take 1 tablet (5 mg total) by mouth daily. 08/14/22   Ardith Dark, MD  atorvastatin (LIPITOR) 20 MG tablet Take by mouth.    [provider]  cyanocobalamin (VITAMIN B12) 1000 MCG/ML injection 1000 mcg (1 mg) injection once every other week 07/08/22   Ardith Dark, MD  oxyCODONE-acetaminophen (PERCOCET/ROXICET) 5-325 MG tablet Take 1 tablet by mouth every 8 (eight) hours as needed for severe pain. 08/18/22   Ardith Dark, MD  tiZANidine (ZANAFLEX) 4 MG tablet Take 1 tablet (4 mg total) by mouth 3 (three) times daily. 06/30/22   Ardith Dark, MD  triazolam (HALCION) 0.25 MG tablet Take 1 tablet (0.25 mg total) by mouth at bedtime as needed. for sleep 06/13/22   Ardith Dark, MD  Vitamin D, Ergocalciferol, (DRISDOL) 1.25 MG (50000 UNIT) CAPS capsule Take 1 capsule (50,000 Units total) by  mouth every 7 (seven) days. 07/08/22   Ardith Dark, MD      Allergies    Ambien [zolpidem tartrate], Flexeril [cyclobenzaprine], Toradol [ketorolac tromethamine], and Naproxen    Review of Systems   Review of Systems  Physical Exam Updated Vital Signs BP 135/72   Pulse 63   Temp 98.7 F (37.1 C)   Resp 16   SpO2 98%  Physical Exam Constitutional:      Comments: Alert nontoxic clinically well in appearance.  Well-nourished well-developed.  HENT:     Head: Normocephalic and atraumatic.  Eyes:     Extraocular Movements: Extraocular movements intact.  Pulmonary:     Effort: Pulmonary effort is normal.  Musculoskeletal:     Comments: Patient has some swelling of the left wrist at the radial aspect of the hand and about the area of the scaphoid, is very tender.  Also slight bruising at the ulnar styloid.  Radial pulse 2+ and strong.  Hand is warm and dry.  Good perfusion.  No diffuse swelling of the hand.  She can move all fingers but has pain with range of motion of the thumb.  Skin:    General: Skin is warm and dry.  Neurological:     General: No focal deficit present.     Mental Status: She is oriented to person, place, and time.     Coordination: Coordination normal.  Psychiatric:  Mood and Affect: Mood normal.     ED Results / Procedures / Treatments   Labs (all labs ordered are listed, but only abnormal results are displayed) Labs Reviewed - No data to display  EKG None  Radiology DG Wrist Complete Left  Result Date: 08/30/2022 CLINICAL DATA:  Acute LEFT wrist pain following fall. Initial encounter. EXAM: LEFT WRIST - COMPLETE 4 VIEW COMPARISON:  None Available. FINDINGS: There is no evidence of acute fracture or dislocation. Mild soft tissue swelling is noted. Degenerative changes at the 1st carpometacarpal joint are present. No suspicious focal bony lesions are identified. IMPRESSION: Mild soft tissue swelling without acute bony abnormality.  Electronically Signed   By: Margarette Canada M.D.   On: 08/30/2022 08:59    Procedures Procedures    Medications Ordered in ED Medications - No data to display  ED Course/ Medical Decision Making/ A&P                           Medical Decision Making Amount and/or Complexity of Data Reviewed Radiology: ordered.  Risk Prescription drug management.   Patient had a fall onto outstretched wrist.  No other areas of pain or complaints.  There is some swelling focal but no deformity.  Patient is neurovascularly intact.  X-ray images reviewed by myself as well as interpretation of radiology.  No apparent fractures.  Given area of swelling and pain, scaphoid fracture is considered.  Patient reports she has to have mobility of her left hand.  She is left-handed.  She is caring for her husband at home who requires a lot of care.  Patient is placed in a removable wrist splint with a thumb stabilization.  Patient did not feel that she could adequately attend to home needs with a bulky Ortho-Glass spica.  Patient plans to follow-up with orthopedics as soon as possible.   Patient has a prescription for Percocet that is listed in her scribing history.  Patient reports that she has problems with esophageal irritation and certain medications exacerbate that.  She reports she tried the Percocet and got nauseated and it seemed to exacerbate her GERD type symptoms.  Reports she is not taking it.  She reports she does feel that she needs something for pain.  She will try hydrocodone to see if it has any different effect and if it is not effective or gives her difficulty with her esophageal reflux type symptoms, she will not take it.        Final Clinical Impression(s) / ED Diagnoses Final diagnoses:  Sprain of left wrist, initial encounter    Rx / DC Orders ED Discharge Orders          Ordered    HYDROcodone-acetaminophen (NORCO/VICODIN) 5-325 MG tablet  Every 6 hours PRN        08/30/22 1103               Charlesetta Shanks, MD 08/30/22 1113

## 2022-08-30 NOTE — Discharge Instructions (Signed)
1.  You appear to have at least a significant sprain to the wrist.  The x-ray did not show broken bones however, sometimes a crack is present in small bones in the wrist that are not easily seen on x-ray.  You have been put in a splint and are advised to follow-up with the orthopedic hand specialist for recheck. 2.  Try to wear your splint is much as possible.  Elevate your arm and apply well wrapped ice pack to areas of swelling on the wrist and the hand. 3.  You have been given a prescription for hydrocodone acetaminophen to take for pain.  If this makes you nauseated or you do not tolerate it, do not take it.

## 2022-09-01 DIAGNOSIS — S63502A Unspecified sprain of left wrist, initial encounter: Secondary | ICD-10-CM | POA: Diagnosis not present

## 2022-09-15 DIAGNOSIS — M4316 Spondylolisthesis, lumbar region: Secondary | ICD-10-CM | POA: Diagnosis not present

## 2022-09-16 DIAGNOSIS — S63502A Unspecified sprain of left wrist, initial encounter: Secondary | ICD-10-CM | POA: Diagnosis not present

## 2022-09-25 ENCOUNTER — Ambulatory Visit (INDEPENDENT_AMBULATORY_CARE_PROVIDER_SITE_OTHER): Payer: Medicare Other | Admitting: Family Medicine

## 2022-09-25 ENCOUNTER — Encounter: Payer: Self-pay | Admitting: Family Medicine

## 2022-09-25 VITALS — BP 130/80 | HR 77 | Temp 97.8°F | Ht 63.0 in | Wt 111.4 lb

## 2022-09-25 DIAGNOSIS — E7800 Pure hypercholesterolemia, unspecified: Secondary | ICD-10-CM | POA: Diagnosis not present

## 2022-09-25 DIAGNOSIS — M0609 Rheumatoid arthritis without rheumatoid factor, multiple sites: Secondary | ICD-10-CM

## 2022-09-25 DIAGNOSIS — I10 Essential (primary) hypertension: Secondary | ICD-10-CM

## 2022-09-25 DIAGNOSIS — F4323 Adjustment disorder with mixed anxiety and depressed mood: Secondary | ICD-10-CM

## 2022-09-25 MED ORDER — SIMVASTATIN 10 MG PO TABS: 10.0000 mg | ORAL_TABLET | Freq: Every day | ORAL | 3 refills | Status: DC

## 2022-09-25 MED ORDER — PREDNISONE 10 MG PO TABS: 20.0000 mg | ORAL_TABLET | Freq: Every day | ORAL | 1 refills | Status: DC

## 2022-09-25 NOTE — Assessment & Plan Note (Signed)
Initially elevated though is at goal on recheck.  She has been compliant with amlodipine 5 mg daily.  Home readings have been at goal as well.  We will continue amlodipine for now.  She will continue home monitoring and let us know if persistently elevated.

## 2022-09-25 NOTE — Assessment & Plan Note (Signed)
She does have upcoming appointment with rheumatologist.  Her pain is still not fully controlled.  She has been on chronic low-dose daily prednisone in the past and has done reasonably well with this and would like to restart today.  She is aware of long-term side effects of chronic prednisone use including decreased immunity, osteoporosis, and metabolic side effects.  We will give enough prednisone to last until she gets into see her rheumatologist though discussed with patient that this is not something that should be provided by a primary care provider and that she will need to have further management pended by her rheumatologist.

## 2022-09-25 NOTE — Progress Notes (Signed)
   Patricia Underwood is a 71 y.o. female who presents today for an office visit.  Assessment/Plan:  Chronic Problems Addressed Today: Essential hypertension Initially elevated though is at goal on recheck.  She has been compliant with amlodipine 5 mg daily.  Home readings have been at goal as well.  We will continue amlodipine for now.  She will continue home monitoring and let us know if persistently elevated.  Situational mixed anxiety and depressive disorder Still has a lot of stress at home.  She does not wish for further intervention at this point.  HLD (hyperlipidemia) She is having some myalgias with her atorvastatin.  We will switch to lower intensity statin with simvastatin 10 mg nightly.  She will come back in 3 to 6 months and we can recheck lipids at that time and titrate the dose of simvastatin as needed.  Rheumatoid arthritis ([facility]) She does have upcoming appointment with rheumatologist.  Her pain is still not fully controlled.  She has been on chronic low-dose daily prednisone in the past and has done reasonably well with this and would like to restart today.  She is aware of long-term side effects of chronic prednisone use including decreased immunity, osteoporosis, and metabolic side effects.  We will give enough prednisone to last until she gets into see her rheumatologist though discussed with patient that this is not something that should be provided by a primary care provider and that she will need to have further management pended by her rheumatologist.     Subjective:  HPI:  See A/p for status of chronic conditions.  Main concern today is statin intolerance.  She has been on Lipitor intermittently for the last several months.  She does note that after she takes medication she will get more aches and pains in joints and muscles.  If she stops the medication pain goes away.       Objective:  Physical Exam: BP 130/80   Pulse 77   Temp 97.8 F (36.6 C) (Temporal)   Ht 5\' 3"   (1.6 m)   Wt 111 lb 6.4 oz (50.5 kg)   SpO2 98%   BMI 19.73 kg/m   Gen: No acute distress, resting comfortably CV: Regular rate and rhythm with no murmurs appreciated Pulm: Normal work of breathing, clear to auscultation bilaterally with no crackles, wheezes, or rhonchi Neuro: Grossly normal, moves all extremities Psych: Normal affect and thought content       M. , MD 09/25/2022 11:21 AM

## 2022-09-25 NOTE — Assessment & Plan Note (Signed)
She is having some myalgias with her atorvastatin.  We will switch to lower intensity statin with simvastatin 10 mg nightly.  She will come back in 3 to 6 months and we can recheck lipids at that time and titrate the dose of simvastatin as needed.

## 2022-09-25 NOTE — Patient Instructions (Signed)
It was very nice to see you today!  We will switch Lipitor Lipitor over to simvastatin.  We will send in more prednisone.  This is something that should come from her rheumatologist.  This is not something that we should be prescribing as a primary care provider.  Please keep an eye on your blood pressure and let me know if it is persistently elevated.  Take care, Dr Jimmey Ralph  PLEASE NOTE:  If you had any lab tests please let us know if you have not heard back within a few days. You may see your results on mychart before we have a chance to review them but we will give you a call once they are reviewed by [place]. If we ordered any referrals today, please let us know if you have not heard from their office within the next week.   Please try these tips to maintain a healthy lifestyle:  Eat at least 3 REAL meals and 1-2 snacks per day.  Aim for no more than 5 hours between eating.  If you eat breakfast, please do so within one hour of getting up.   Each meal should contain half fruits/vegetables, one quarter protein, and one quarter carbs (no bigger than a computer mouse)  Cut down on sweet beverages. This includes juice, soda, and sweet tea.   Drink at least 1 glass of water with each meal and aim for at least 8 glasses per day  Exercise at least 150 minutes every week.

## 2022-09-25 NOTE — Assessment & Plan Note (Signed)
Still has a lot of stress at home.  She does not wish for further intervention at this point.

## 2022-10-09 ENCOUNTER — Encounter: Payer: Self-pay | Admitting: Family Medicine

## 2022-10-09 ENCOUNTER — Other Ambulatory Visit: Payer: Self-pay | Admitting: *Deleted

## 2022-10-09 ENCOUNTER — Ambulatory Visit (INDEPENDENT_AMBULATORY_CARE_PROVIDER_SITE_OTHER): Payer: Medicare Other | Admitting: Family Medicine

## 2022-10-09 VITALS — BP 159/83 | HR 76 | Temp 97.7°F | Ht 63.0 in | Wt 112.4 lb

## 2022-10-09 DIAGNOSIS — I10 Essential (primary) hypertension: Secondary | ICD-10-CM

## 2022-10-09 DIAGNOSIS — M8580 Other specified disorders of bone density and structure, unspecified site: Secondary | ICD-10-CM

## 2022-10-09 DIAGNOSIS — M0609 Rheumatoid arthritis without rheumatoid factor, multiple sites: Secondary | ICD-10-CM

## 2022-10-09 DIAGNOSIS — E559 Vitamin D deficiency, unspecified: Secondary | ICD-10-CM | POA: Diagnosis not present

## 2022-10-09 MED ORDER — DENOSUMAB 60 MG/ML ~~LOC~~ SOSY
60.0000 mg | PREFILLED_SYRINGE | Freq: Once | SUBCUTANEOUS | Status: AC
  Administered 2023-10-07: 60 mg via SUBCUTANEOUS

## 2022-10-09 MED ORDER — PREDNISONE 20 MG PO TABS
20.0000 mg | ORAL_TABLET | Freq: Every day | ORAL | 1 refills | Status: DC
Start: 2022-10-09 — End: 2022-11-27

## 2022-10-09 NOTE — Assessment & Plan Note (Signed)
Had DEXA scan done at [facility] a month ago which showed osteopenia however did have elevated risk of hip fracture at 8.3% and risk of major osteoporotic fracture at 30%.  She would like to start treatment for this.  The physicians at [facility] had recommended Prolia and she would like to start this.  We will check with her insurance to see if we can get this approved.  She is on vitamin D supplementation and gets plenty of calcium in her diet.  We did discuss that her chronic prednisone is likely contributing to her bone loss and increased risk for fracture.  She would like to continue with her current dose of prednisone for now given that it does significantly improve her rheumatoid symptoms.  She will be following up with rheumatology soon as below.  Discussed with patient that her primary care office should not be prescribing her chronic prednisone.

## 2022-10-09 NOTE — Assessment & Plan Note (Signed)
Initially elevated today though she has been typically well controlled on amlodipine 5 mg daily.  Home readings have been at goal as well.  Patient left the office before we were able to recheck again today.  She will continue to monitor at home and let us know if persistently elevated.

## 2022-10-09 NOTE — Patient Instructions (Signed)
It was very nice to see you today!  We will refill your prednisone today.  We will check about getting you approved for Prolia.  Please keep an eye on your blood pressure and let me know if it is persistently 140/90 or higher.  Take care, Dr Jimmey Ralph  PLEASE NOTE:  If you had any lab tests please let us know if you have not heard back within a few days. You may see your results on mychart before we have a chance to review them but we will give you a call once they are reviewed by [place]. If we ordered any referrals today, please let us know if you have not heard from their office within the next week.   Please try these tips to maintain a healthy lifestyle:  Eat at least 3 REAL meals and 1-2 snacks per day.  Aim for no more than 5 hours between eating.  If you eat breakfast, please do so within one hour of getting up.   Each meal should contain half fruits/vegetables, one quarter protein, and one quarter carbs (no bigger than a computer mouse)  Cut down on sweet beverages. This includes juice, soda, and sweet tea.   Drink at least 1 glass of water with each meal and aim for at least 8 glasses per day  Exercise at least 150 minutes every week.

## 2022-10-09 NOTE — Assessment & Plan Note (Signed)
>>  ASSESSMENT AND PLAN FOR OSTEOPENIA WRITTEN ON 10/09/2022 11:06 AM BY Emon Lance M, MD  Had DEXA scan done at [facility] a month ago which showed osteopenia however did have elevated risk of hip fracture at 8.3% and risk of major osteoporotic fracture at 30%.  She would like to start treatment for this.  The physicians at [facility] had recommended Prolia  and she would like to start this.  We will check with her insurance to see if we can get this approved.  She is on vitamin D  supplementation and gets plenty of calcium  in her diet.  We did discuss that her chronic prednisone  is likely contributing to her bone loss and increased risk for fracture.  She would like to continue with her current dose of prednisone  for now given that it does significantly improve her rheumatoid symptoms.  She will be following up with rheumatology soon as below.  Discussed with patient that her primary care office should not be prescribing her chronic prednisone .

## 2022-10-09 NOTE — Assessment & Plan Note (Signed)
She is on chronic prednisone 20 mg daily for this.  She does have an upcoming appoint with rheumatology.  Her pain seems to be relatively well controlled on this dose however she does need a refill.  We again discussed long-term side effects of chronic prednisone use such as osteoporosis, decreased immunity, and metabolic side effects.  Advised patient again that chronic prednisone should not be coming from her primary care office and that this should be managed by her rheumatologist going forward.  She is agreeable with this.  We will refill her prednisone today.

## 2022-10-09 NOTE — Progress Notes (Signed)
   Patricia Underwood is a 71 y.o. female who presents today for an office visit.  Assessment/Plan:  Chronic Problems Addressed Today: Osteopenia Had DEXA scan done at [facility] a month ago which showed osteopenia however did have elevated risk of hip fracture at 8.3% and risk of major osteoporotic fracture at 30%.  She would like to start treatment for this.  The physicians at [facility] had recommended Prolia and she would like to start this.  We will check with her insurance to see if we can get this approved.  She is on vitamin D supplementation and gets plenty of calcium in her diet.  We did discuss that her chronic prednisone is likely contributing to her bone loss and increased risk for fracture.  She would like to continue with her current dose of prednisone for now given that it does significantly improve her rheumatoid symptoms.  She will be following up with rheumatology soon as below.  Discussed with patient that her primary care office should not be prescribing her chronic prednisone.  Rheumatoid arthritis (HCC) She is on chronic prednisone 20 mg daily for this.  She does have an upcoming appoint with rheumatology.  Her pain seems to be relatively well controlled on this dose however she does need a refill.  We again discussed long-term side effects of chronic prednisone use such as osteoporosis, decreased immunity, and metabolic side effects.  Advised patient again that chronic prednisone should not be coming from her primary care office and that this should be managed by her rheumatologist going forward.  She is agreeable with this.  We will refill her prednisone today.  Essential hypertension Initially elevated today though she has been typically well controlled on amlodipine 5 mg daily.  Home readings have been at goal as well.  Patient left the office before we were able to recheck again today.  She will continue to monitor at home and let us know if persistently elevated.      Subjective:  HPI:  See A/p for status of chronic conditions.         Objective:  Physical Exam: BP (!) 159/83   Pulse 76   Temp 97.7 F (36.5 C) (Temporal)   Ht 5\' 3"  (1.6 m)   Wt 112 lb 6.4 oz (51 kg)   SpO2 98%   BMI 19.91 kg/m   Gen: No acute distress, resting comfortably Neuro: Grossly normal, moves all extremities Psych: Normal affect and thought content       M. , MD 10/09/2022 11:10 AM

## 2022-10-28 ENCOUNTER — Ambulatory Visit (INDEPENDENT_AMBULATORY_CARE_PROVIDER_SITE_OTHER): Payer: Medicare Other

## 2022-10-28 VITALS — Wt 112.0 lb

## 2022-10-28 DIAGNOSIS — Z0000 Encounter for general adult medical examination without abnormal findings: Secondary | ICD-10-CM

## 2022-10-28 DIAGNOSIS — Z1231 Encounter for screening mammogram for malignant neoplasm of breast: Secondary | ICD-10-CM

## 2022-10-28 NOTE — Addendum Note (Signed)
Addended by: Marzella Schlein on: 10/28/2022 02:09 PM   Modules accepted: Orders

## 2022-10-28 NOTE — Progress Notes (Signed)
I connected with  Lallie Latourette on 10/28/22 by a audio enabled telemedicine application and verified that I am speaking with the correct person using two identifiers.  Patient Location: Home  Provider Location: Office/Clinic  I discussed the limitations of evaluation and management by telemedicine. The patient expressed understanding and agreed to proceed.   Subjective:   Patricia Underwood is a 71 y.o. female who presents for Medicare Annual (Subsequent) preventive examination.  Review of Systems     Cardiac Risk Factors include: advanced age (>20men, >55 women);dyslipidemia;hypertension     Objective:    Today's Vitals   10/28/22 1111  Weight: 112 lb (50.8 kg)   Body mass index is 19.84 kg/m.     10/28/2022   11:18 AM 08/30/2022    8:32 AM 06/09/2022    6:41 AM 03/30/2022   12:38 PM 10/22/2021   10:27 AM 11/22/2018   12:31 PM  Advanced Directives  Does Patient Have a Medical Advance Directive? Yes No No No Yes No  Type of Paramedic of [place];Living will       Does patient want to make changes to medical advance directive?     No - Patient declined   Copy of [place] in Chart? No - copy requested       Would patient like information on creating a medical advance directive?  No - Patient declined No - Patient declined   No - Patient declined    Current Medications (verified) Outpatient Encounter Medications as of 10/28/2022  Medication Sig   cyanocobalamin (VITAMIN B12) 1000 MCG/ML injection 1000 mcg (1 mg) injection once every other week   predniSONE (DELTASONE) 20 MG tablet Take 1 tablet (20 mg total) by mouth daily with breakfast.   simvastatin (ZOCOR) 10 MG tablet Take 1 tablet (10 mg total) by mouth at bedtime.   triazolam (HALCION) 0.25 MG tablet Take 1 tablet (0.25 mg total) by mouth at bedtime as needed. for sleep   Vitamin D, Ergocalciferol, (DRISDOL) 1.25 MG (50000 UNIT) CAPS capsule Take 1 capsule (50,000 Units total) by  mouth every 7 (seven) days.   [DISCONTINUED] amLODipine (NORVASC) 5 MG tablet Take 1 tablet (5 mg total) by mouth daily.   [DISCONTINUED] tiZANidine (ZANAFLEX) 4 MG tablet Take 1 tablet (4 mg total) by mouth 3 (three) times daily.   Facility-Administered Encounter Medications as of 10/28/2022  Medication   denosumab (PROLIA) injection 60 mg    Allergies (verified) Ambien [zolpidem tartrate], Flexeril [cyclobenzaprine], Toradol [ketorolac tromethamine], and Naproxen   History: Past Medical History:  Diagnosis Date   Aortic atherosclerosis ([place])    B12 deficiency, Rx B12 injections 07/05/2017   Chronic back pain    Chronic right shoulder pain 05/30/2017   Coronary artery calcification seen on CAT scan    Esophageal spasm    Fibromyalgia    HLD (hyperlipidemia) 05/30/2017   Past Surgical History:  Procedure Laterality Date   ABDOMINAL HYSTERECTOMY     KNEE SURGERY Right    LUMBAR FUSION     L4-L5   SHOULDER ARTHROSCOPY Right    Family History  Problem Relation Age of Onset   CAD Mother        died of massive MI at 71   CVA Father        x 2   Hyperlipidemia Sister    Diabetes Neg Hx    Social History   Socioeconomic History   Marital status: Married    Spouse name: Not on  file   Number of children: Not on file   Years of education: Not on file   Highest education level: Not on file  Occupational History   Occupation: Retired   Tobacco Use   Smoking status: Former   Smokeless tobacco: Never   Tobacco comments:    quit in [place] 2021  Substance and Sexual Activity   Alcohol use: Yes    Alcohol/week: 7.0 standard drinks of alcohol    Types: 7 Glasses of wine per week   Drug use: No   Sexual activity: Not Currently    Partners: Male    Birth control/protection: None  Other Topics Concern   Not on file  Social History Narrative   Not on file   Social Determinants of Health   Financial Resource Strain: Low Risk  (10/28/2022)   Overall Financial Resource  Strain (CARDIA)    Difficulty of Paying Living Expenses: Not hard at all  Food Insecurity: No Food Insecurity (10/28/2022)   Hunger Vital Sign    Worried About Running Out of Food in the Last Year: Never true    Ran Out of Food in the Last Year: Never true  Transportation Needs: No Transportation Needs (10/28/2022)   PRAPARE - Hydrologist (Medical): No    Lack of Transportation (Non-Medical): No  Physical Activity: Sufficiently Active (10/28/2022)   Exercise Vital Sign    Days of Exercise per Week: 7 days    Minutes of Exercise per Session: 120 min  Stress: No Stress Concern Present (10/28/2022)   [place]    Feeling of Stress : Not at all  Social Connections: Moderately Isolated (10/28/2022)   Social Connection and Isolation Panel [NHANES]    Frequency of Communication with Friends and Family: More than three times a week    Frequency of Social Gatherings with Friends and Family: More than three times a week    Attends Religious Services: Never    Marine scientist or Organizations: No    Attends Archivist Meetings: Never    Marital Status: Married    Tobacco Counseling Counseling given: Not Answered Tobacco comments: quit in [place] 2021   Clinical Intake:  Pre-visit preparation completed: Yes  Pain : No/denies pain     BMI - recorded: 19.84 Nutritional Status: BMI of 19-24  Normal Nutritional Risks: None Diabetes: No  How often do you need to have someone help you when you read instructions, pamphlets, or other written materials from your doctor or pharmacy?: 1 - Never  Diabetic?no  Interpreter Needed?: No  Information entered by :: Charlott Rakes, LPN   Activities of Daily Living    10/28/2022   11:20 AM  In your present state of health, do you have any difficulty performing the following activities:  Hearing? 1  Comment wears a hearing aid   Vision? 0  Difficulty concentrating or making decisions? 0  Walking or climbing stairs? 0  Dressing or bathing? 0  Doing errands, shopping? 0  Preparing Food and eating ? N  Using the Toilet? N  In the past six months, have you accidently leaked urine? N  Do you have problems with loss of bowel control? N  Managing your Medications? N  Managing your Finances? N  Housekeeping or managing your Housekeeping? N    Patient Care Team: Vivi Barrack, MD as PCP - General (Family Medicine) Buford Dresser, MD as PCP - Cardiology (Cardiology)  Luciano Cutter, MD as Consulting Physician (Pulmonary Disease) Andrena Mews, DO as Consulting Physician (Sports Medicine) Barnett Abu, MD as Consulting Physician (Neurosurgery)  Indicate any recent Medical Services you may have received from other than [facility] providers in the past year (date may be approximate).     Assessment:   This is a routine wellness examination for Deepa.  Hearing/Vision screen Hearing Screening - Comments:: Wears hearing aids  Vision Screening - Comments:: Pt follows up with provider near [employer]   Dietary issues and exercise activities discussed: Current Exercise Habits: Home exercise routine, Type of exercise: walking, Time (Minutes): > 60, Frequency (Times/Week): 7, Weekly Exercise (Minutes/Week): 0   Goals Addressed             This Visit's Progress    Patient Stated       Stay healthy        Depression Screen    10/28/2022   11:17 AM 10/09/2022   10:30 AM 06/05/2022   10:50 AM 05/22/2022   11:14 AM 10/22/2021   10:26 AM 07/23/2021   10:20 AM 03/04/2021   11:20 AM  PHQ 2/9 Scores  PHQ - 2 Score 0 0 0 0 0 0 0    Fall Risk    10/28/2022   11:19 AM 10/09/2022   10:30 AM 06/05/2022   10:50 AM 05/22/2022   11:14 AM 10/22/2021   10:27 AM  Fall Risk   Falls in the past year? 1 0 0 0 0  Number falls in past yr: 1 0 0 0 0  Injury with Fall? 1 0 0 0 0  Comment broke left wrist       Risk for fall due to : Impaired vision No Fall Risks No Fall Risks No Fall Risks Impaired vision  Follow up Falls prevention discussed    Falls prevention discussed    FALL RISK PREVENTION PERTAINING TO THE HOME:  Any stairs in or around the home? Yes  If so, are there any without handrails? No  Home free of loose throw rugs in walkways, pet beds, electrical cords, etc? Yes  Adequate lighting in your home to reduce risk of falls? Yes   ASSISTIVE DEVICES UTILIZED TO PREVENT FALLS:  Life alert? No  Use of a cane, walker or w/c? No  Grab bars in the bathroom? No  Shower chair or bench in shower? No  Elevated toilet seat or a handicapped toilet? No   TIMED UP AND GO:  Was the test performed? No .   Cognitive Function:    09/11/2018    6:20 PM  MMSE - Mini Mental State Exam  Orientation to time 5  Orientation to Place 5  Registration 3  Attention/ Calculation 5  Recall 3  Language- name 2 objects 2  Language- repeat 1  Language- follow 3 step command 3  Language- read & follow direction 1  Write a sentence 1  Copy design 1  Total score 30        10/28/2022   11:21 AM  6CIT Screen  What Year? 0 points  What month? 0 points  What time? 0 points  Count back from 20 0 points  Months in reverse 0 points  Repeat phrase 0 points  Total Score 0 points    Immunizations Immunization History  Administered Date(s) Administered   Influenza-Unspecified 11/10/2018   PFIZER Comirnaty(Gray Top)Covid-19 Tri-Sucrose Vaccine 12/13/2019, 01/03/2020   PFIZER(Purple Top)SARS-COV-2 Vaccination 12/13/2019, 01/03/2020, 08/07/2020, 05/30/2021, 09/06/2021   Tdap 05/31/2013,  03/30/2022    TDAP status: Up to date  Flu Vaccine status: Up to date  Pneumococcal vaccine status: Due, Education has been provided regarding the importance of this vaccine. Advised may receive this vaccine at local pharmacy or Health Dept. Aware to provide a copy of the vaccination record if obtained from  local pharmacy or Health Dept. Verbalized acceptance and understanding.  Covid-19 vaccine status: Completed vaccines  Qualifies for Shingles Vaccine? Yes   Zostavax completed No   Shingrix Completed?: No.    Education has been provided regarding the importance of this vaccine. Patient has been advised to call insurance company to determine out of pocket expense if they have not yet received this vaccine. Advised may also receive vaccine at local pharmacy or Health Dept. Verbalized acceptance and understanding.  Screening Tests Health Maintenance  Topic Date Due   COVID-19 Vaccine (8 - 2023-24 season) 07/11/2022   Pneumonia Vaccine 31+ Years old (1 - PCV) 12/31/2022 (Originally 01/03/2016)   MAMMOGRAM  05/23/2023 (Originally 11/05/2020)   Medicare Annual Wellness (AWV)  10/29/2023   DTaP/Tdap/Td (3 - Td or Tdap) 03/30/2032   DEXA SCAN  Completed   HPV VACCINES  Aged Out   INFLUENZA VACCINE  Discontinued   COLONOSCOPY (Pts 45-41yrs Insurance coverage will need to be confirmed)  Discontinued   Zoster Vaccines- Shingrix  Discontinued    Health Maintenance  Health Maintenance Due  Topic Date Due   COVID-19 Vaccine (8 - 2023-24 season) 07/11/2022    Colorectal cancer screening: No longer required.   Mammogram status: Ordered 10/28/22. Pt provided with contact info and advised to call to schedule appt.   Bone Density status: Completed 10/15/18. Results reflect: Bone density results: OSTEOPENIA. Repeat every 2 years.  Additional Screening:  Hepatitis C Screening: does not qualify;  Vision Screening: Recommended annual ophthalmology exams for early detection of glaucoma and other disorders of the eye. Is the patient up to date with their annual eye exam?  Yes  Who is the provider or what is the name of the office in which the patient attends annual eye exams? Provider near [employer]  If pt is not established with a provider, would they like to be referred to a provider to establish  care? No .   Dental Screening: Recommended annual dental exams for proper oral hygiene  Community Resource Referral / Chronic Care Management: CRR required this visit?  No   CCM required this visit?  No      Plan:     I have personally reviewed and noted the following in the patient's chart:   Medical and social history Use of alcohol, tobacco or illicit drugs  Current medications and supplements including opioid prescriptions. Patient is not currently taking opioid prescriptions. Functional ability and status Nutritional status Physical activity Advanced directives List of other physicians Hospitalizations, surgeries, and ER visits in previous 12 months Vitals Screenings to include cognitive, depression, and falls Referrals and appointments  In addition, I have reviewed and discussed with patient certain preventive protocols, quality metrics, and best practice recommendations. A written personalized care plan for preventive services as well as general preventive health recommendations were provided to patient.     Willette Brace, LPN   [id number]   Nurse Notes: none

## 2022-10-28 NOTE — Patient Instructions (Signed)
[name] , Thank you for taking time to come for your Medicare Wellness Visit. I appreciate your ongoing commitment to your health goals. Please review the following plan we discussed and let me know if I can assist you in the future.   These are the goals we discussed:  Goals      Patient Stated     Continue exercise      Patient Stated     Stay healthy         This is a list of the screening recommended for you and due dates:  Health Maintenance  Topic Date Due   COVID-19 Vaccine (8 - 2023-24 season) 07/11/2022   Pneumonia Vaccine (1 - PCV) 12/31/2022*   Mammogram  05/23/2023*   Medicare Annual Wellness Visit  10/29/2023   DTaP/Tdap/Td vaccine (3 - Td or Tdap) 03/30/2032   DEXA scan (bone density measurement)  Completed   HPV Vaccine  Aged Out   Flu Shot  Discontinued   Colon Cancer Screening  Discontinued   Zoster (Shingles) Vaccine  Discontinued  *Topic was postponed. The date shown is not the original due date.    Advanced directives: Please bring a copy of your health care power of attorney and living will to the office at your convenience.   Conditions/risks identified: stay active and healthy   Next appointment: Follow up in one year for your annual wellness visit    Preventive Care 65 Years and Older, Female Preventive care refers to lifestyle choices and visits with your health care provider that can promote health and wellness. What does preventive care include? A yearly physical exam. This is also called an annual well check. Dental exams once or twice a year. Routine eye exams. Ask your health care provider how often you should have your eyes checked. Personal lifestyle choices, including: Daily care of your teeth and gums. Regular physical activity. Eating a healthy diet. Avoiding tobacco and drug use. Limiting alcohol use. Practicing safe sex. Taking low-dose aspirin every day. Taking vitamin and mineral supplements as recommended by your health  care provider. What happens during an annual well check? The services and screenings done by your health care provider during your annual well check will depend on your age, overall health, lifestyle risk factors, and family history of disease. Counseling  Your health care provider may ask you questions about your: Alcohol use. Tobacco use. Drug use. Emotional well-being. Home and relationship well-being. Sexual activity. Eating habits. History of falls. Memory and ability to understand (cognition). Work and work Astronomer. Reproductive health. Screening  You may have the following tests or measurements: Height, weight, and BMI. Blood pressure. Lipid and cholesterol levels. These may be checked every 5 years, or more frequently if you are over 28 years old. Skin check. Lung cancer screening. You may have this screening every year starting at age 61 if you have a 30-pack-year history of smoking and currently smoke or have quit within the past 15 years. Fecal occult blood test (FOBT) of the stool. You may have this test every year starting at age 38. Flexible sigmoidoscopy or colonoscopy. You may have a sigmoidoscopy every 5 years or a colonoscopy every 10 years starting at age 20. Hepatitis C blood test. Hepatitis B blood test. Sexually transmitted disease (STD) testing. Diabetes screening. This is done by checking your blood sugar (glucose) after you have not eaten for a while (fasting). You may have this done every 1-3 years. Bone density scan. This is done to  screen for osteoporosis. You may have this done starting at age 42. Mammogram. This may be done every 1-2 years. Talk to your health care provider about how often you should have regular mammograms. Talk with your health care provider about your test results, treatment options, and if necessary, the need for more tests. Vaccines  Your health care provider may recommend certain vaccines, such as: Influenza vaccine. This is  recommended every year. Tetanus, diphtheria, and acellular pertussis (Tdap, Td) vaccine. You may need a Td booster every 10 years. Zoster vaccine. You may need this after age 82. Pneumococcal 13-valent conjugate (PCV13) vaccine. One dose is recommended after age 20. Pneumococcal polysaccharide (PPSV23) vaccine. One dose is recommended after age 13. Talk to your health care provider about which screenings and vaccines you need and how often you need them. This information is not intended to replace advice given to you by your health care provider. Make sure you discuss any questions you have with your health care provider. Document Released: 11/23/2015 Document Revised: 07/16/2016 Document Reviewed: 08/28/2015 Elsevier Interactive Patient Education  2017 [place] Prevention in the Home Falls can cause injuries. They can happen to people of all ages. There are many things you can do to make your home safe and to help prevent falls. What can I do on the outside of my home? Regularly fix the edges of walkways and driveways and fix any cracks. Remove anything that might make you trip as you walk through a door, such as a raised step or threshold. Trim any bushes or trees on the path to your home. Use bright outdoor lighting. Clear any walking paths of anything that might make someone trip, such as rocks or tools. Regularly check to see if handrails are loose or broken. Make sure that both sides of any steps have handrails. Any raised decks and porches should have guardrails on the edges. Have any leaves, snow, or ice cleared regularly. Use sand or salt on walking paths during winter. Clean up any spills in your garage right away. This includes oil or grease spills. What can I do in the bathroom? Use night lights. Install grab bars by the toilet and in the tub and shower. Do not use towel bars as grab bars. Use non-skid mats or decals in the tub or shower. If you need to sit down in  the shower, use a plastic, non-slip stool. Keep the floor dry. Clean up any water that spills on the floor as soon as it happens. Remove soap buildup in the tub or shower regularly. Attach bath mats securely with double-sided non-slip rug tape. Do not have throw rugs and other things on the floor that can make you trip. What can I do in the bedroom? Use night lights. Make sure that you have a light by your bed that is easy to reach. Do not use any sheets or blankets that are too big for your bed. They should not hang down onto the floor. Have a firm chair that has side arms. You can use this for support while you get dressed. Do not have throw rugs and other things on the floor that can make you trip. What can I do in the kitchen? Clean up any spills right away. Avoid walking on wet floors. Keep items that you use a lot in easy-to-reach places. If you need to reach something above you, use a strong step stool that has a grab bar. Keep electrical cords out of the way.  Do not use floor polish or wax that makes floors slippery. If you must use wax, use non-skid floor wax. Do not have throw rugs and other things on the floor that can make you trip. What can I do with my stairs? Do not leave any items on the stairs. Make sure that there are handrails on both sides of the stairs and use them. Fix handrails that are broken or loose. Make sure that handrails are as long as the stairways. Check any carpeting to make sure that it is firmly attached to the stairs. Fix any carpet that is loose or worn. Avoid having throw rugs at the top or bottom of the stairs. If you do have throw rugs, attach them to the floor with carpet tape. Make sure that you have a light switch at the top of the stairs and the bottom of the stairs. If you do not have them, ask someone to add them for you. What else can I do to help prevent falls? Wear shoes that: Do not have high heels. Have rubber bottoms. Are comfortable  and fit you well. Are closed at the toe. Do not wear sandals. If you use a stepladder: Make sure that it is fully opened. Do not climb a closed stepladder. Make sure that both sides of the stepladder are locked into place. Ask someone to hold it for you, if possible. Clearly mark and make sure that you can see: Any grab bars or handrails. First and last steps. Where the edge of each step is. Use tools that help you move around (mobility aids) if they are needed. These include: Canes. Walkers. Scooters. Crutches. Turn on the lights when you go into a dark area. Replace any light bulbs as soon as they burn out. Set up your furniture so you have a clear path. Avoid moving your furniture around. If any of your floors are uneven, fix them. If there are any pets around you, be aware of where they are. Review your medicines with your doctor. Some medicines can make you feel dizzy. This can increase your chance of falling. Ask your doctor what other things that you can do to help prevent falls. This information is not intended to replace advice given to you by your health care provider. Make sure you discuss any questions you have with your health care provider. Document Released: 08/23/2009 Document Revised: 04/03/2016 Document Reviewed: 12/01/2014 Elsevier Interactive Patient Education  2017 [employer].

## 2022-11-05 NOTE — Progress Notes (Unsigned)
   I, [name], LAT, ATC acting as a scribe for Patricia Graham, MD.  Patricia Underwood is a 71 y.o. female who presents to [facility] today for reoccurring left thumb pain and triggering.  Pt is L-hand dominate. Patient was last seen by Dr. Denyse Amass on 10/31/2021 and was given a left first MCP steroid injection and advised to splint.  Today, patient reports she suffered a L wrist fx in Oct. About 2 weeks ago, she felt a pop in her L forearm and now is unable to extend her left thumb IP joint.  She is left-hand dominant.  She was seen by Dr. Maurice Small at [facility] orthopedics for her wrist fracture.   Pertinent review of systems: No fevers or chills  Relevant historical information: Rheumatoid arthritis   Exam:  BP (!) 162/90   Pulse 87   Ht 5\' 3"  (1.6 m)   Wt 114 lb (51.7 kg)   SpO2 97%   BMI 20.19 kg/m  General: Well Developed, well nourished, and in no acute distress.   MSK: Left thumb and wrist bossing and degenerative changes visible at the base of the thumb. Passive thumb range of motion is intact however patient has very limited active extension of the IP joint.  She does have some extension strength at the MCP and CMC and wrist.  Flexion and opposition strength are intact. Pulses cap refill and sensation are intact distally.    Lab and Radiology Results  [facility] MSK Ultrasound of: Left wrist I am unable to find a clear avulsed or torn extensor tendon in the wrist or thumb. Impression: No obvious clear avulsed tendon visible on ultrasound      Assessment and Plan: 71 y.o. female with 3-week history of concern for avulsion of the extensor tendon of the left thumb. She describes a pop or tear in her forearm occurring about 3 weeks ago with lifting a small object followed by inability to extend her thumb fully.  This is highly concerning for tendon rupture.  I am concerned that this may require surgical correction and delay would  complicate surgery significantly.  Will refer immediately to hand surgery.  She does not have an established relationship with a 62.  Will refer to [facility].     PDMP not reviewed this encounter. Orders Placed This Encounter  Procedures   Hydrographic surveyor LIMITED JOINT SPACE STRUCTURES UP LEFT(NO LINKED CHARGES)    Order Specific Question:   Reason for Exam (SYMPTOM  OR DIAGNOSIS REQUIRED)    Answer:   Left hand pain    Order Specific Question:   Preferred imaging location?    Answer:   [facility] referral to Hand Surgery    Referral Priority:   Urgent    Referral Type:   Surgical    Referral Reason:   Specialty Services Required    Requested Specialty:   Hand Surgery    Number of Visits Requested:   1   No orders of the defined types were placed in this encounter.    Discussed warning signs or symptoms. Please see discharge instructions. Patient expresses understanding.   The above documentation has been reviewed and is accurate and complete [facility], M.D.

## 2022-11-06 ENCOUNTER — Ambulatory Visit: Payer: Self-pay

## 2022-11-06 ENCOUNTER — Ambulatory Visit (INDEPENDENT_AMBULATORY_CARE_PROVIDER_SITE_OTHER): Payer: Medicare Other | Admitting: Family Medicine

## 2022-11-06 VITALS — BP 162/90 | HR 87 | Ht 63.0 in | Wt 114.0 lb

## 2022-11-06 DIAGNOSIS — I2510 Atherosclerotic heart disease of native coronary artery without angina pectoris: Secondary | ICD-10-CM

## 2022-11-06 DIAGNOSIS — S66812A Strain of other specified muscles, fascia and tendons at wrist and hand level, left hand, initial encounter: Secondary | ICD-10-CM

## 2022-11-06 DIAGNOSIS — M65312 Trigger thumb, left thumb: Secondary | ICD-10-CM | POA: Diagnosis not present

## 2022-11-06 DIAGNOSIS — I2584 Coronary atherosclerosis due to calcified coronary lesion: Secondary | ICD-10-CM

## 2022-11-06 NOTE — Patient Instructions (Signed)
Thank you for coming in today.   I am referring you to the hand surgeons at [facility] orthopedics [address] ([address] 2) [place], [place] [address]  CONTACT Phone: [phone]  Fax: [phone]  Ok to call now and get an appointment. Let them know I am referring today with a fax.   This is similar to a mallet finger.

## 2022-11-07 DIAGNOSIS — M66242 Spontaneous rupture of extensor tendons, left hand: Secondary | ICD-10-CM | POA: Diagnosis not present

## 2022-11-13 DIAGNOSIS — M79642 Pain in left hand: Secondary | ICD-10-CM | POA: Diagnosis not present

## 2022-11-19 NOTE — Telephone Encounter (Signed)
Error

## 2022-11-21 ENCOUNTER — Telehealth: Payer: Self-pay | Admitting: Cardiology

## 2022-11-21 ENCOUNTER — Telehealth: Payer: Self-pay | Admitting: Family Medicine

## 2022-11-21 NOTE — Telephone Encounter (Signed)
Christ, Print production planner, states: -Final disposition is to see PCP within 24 hours  - He told patient that since late in the day he recommends going to UC  - Patient declined going to UC/ED and wanted to be scheduled an OV   Patient was then transferred to me directly.   Patient states: -She has been told by access nurse that more than likely another provider wouldn't change medication so she prefers to see PCP  - She doesn't see point in going to UC/ED  I informed patient that we will be able to follow up about next steps once PCP has advised [place]. Pt verbalized understanding. Please Advise.

## 2022-11-21 NOTE — Telephone Encounter (Signed)
Patient states: - She has been experiencing elevated blood pressure readings - Systolic has been staying in 160 range and diastolic has been ranging from 50-80s - Currently experiencing headache and some vision blurriness   Triage informed me they will call patient back. Informed patient of message and that [place] may appear [place]. Pt verbalized understanding.

## 2022-11-21 NOTE — Telephone Encounter (Signed)
Patient Name: Patricia Underwood Gender: Female DOB: 09-Jun-1951 Age: 72 Y 10 M 20 D Return Phone Number: [phone] (Primary) Address: City/ State/ Zip: [place]  [address] [address] [facility] [place] [facility] [place] [facility] Provider Dimas Chyle- MD Contact Type Call Who Is Calling Patient / Member / Family / Caregiver Call Type Triage / Clinical Caller Name Hope. Relationship To Patient Other Return Phone Number [phone] (Primary) Chief Complaint Headache Reason for Call Symptomatic / Request for [place] states she is calling from the office. She states she is calling regarding a pt. with high blood pressure readings along with symptoms. Her blood pressure is 160/50-80. She states she is also experiencing eye blurriness and a headache. Translation No Nurse Assessment Nurse: Ronnell Freshwater, RN, Harrell Gave Date/Time (Eastern Time): 11/21/2022 3:52:45 PM Confirm and document reason for call. If symptomatic, describe symptoms. ---Caller states she has been having high blood pressure readings along with blurry vision and HA. Her most recent blood pressure is 165/77. Has recently started taking Amlodipine, about a week ago. She wants to know if she should take more. Does the patient have any new or worsening symptoms? ---Yes Will a triage be completed? ---Yes Related visit to physician within the last 2 weeks? ---No Does the PT have any chronic conditions? (i.e. diabetes, asthma, this includes High risk factors for pregnancy, etc.) ---Yes List chronic conditions. ---HTN Is this a behavioral health or substance abuse call? ---No PLEASE NOTE: All timestamps contained within this report are represented as [place] Standard Time. CONFIDENTIALTY NOTICE: This fax transmission is intended only for the addressee. It contains information that is legally privileged, confidential  or otherwise protected from use or disclosure. If you are not the intended recipient, you are strictly prohibited from reviewing, disclosing, copying using or disseminating any of this information or taking any action in reliance on or regarding this information. If you have received this fax in error, please notify us immediately by telephone so that we can arrange for its return to [place]. Phone: [phone], Toll-Free: [phone], Fax: [phone] Page: 2 of 2 Call Id: [id number] Guidelines Guideline Title Affirmed Question Affirmed Notes Nurse Date/Time Eilene Ghazi Time) Vision Loss or Change [1] Brief (now gone) blurred vision AND [2] unexplained Ronnell Freshwater, Charolette Child 11/21/2022 3:55:42 PM Headache [1] New headache AND [2] age > 44 Ronnell Freshwater, RN, Harrell Gave 11/21/2022 3:59:58 PM Disp. Time Eilene Ghazi Time) Disposition Final User 11/21/2022 3:57:47 PM SEE PCP WITHIN 3 DAYS Noe Gens 11/21/2022 4:09:59 PM See PCP within 24 Hours Yes Ronnell Freshwater, RN, Harrell Gave Final Disposition 11/21/2022 4:09:59 PM See PCP within 24 Hours Yes Ronnell Freshwater, RN, Eusebio Friendly Disagree/Comply Disagree Caller Understands Yes PreDisposition Call Doctor Care Advice Given Per Guideline SEE PCP WITHIN 3 DAYS: CALL BACK IF: * Blurred vision recurs * You develop other problems with vision. CARE ADVICE given per Vision Loss or Change (Adult) guideline. SEE PCP WITHIN 24 HOURS: * IF OFFICE WILL BE OPEN: You need to be examined within the next 24 hours. Call your doctor (or NP/PA) when the office opens and make an appointment. CALL BACK IF: * Blurred vision or double vision occurs * Numbness or weakness of the face, arm or leg * Difficulty with speaking * You become worse CARE ADVICE given per Headache (Adult) guideline. Comments User: Margret Chance, RN Date/Time Eilene Ghazi Time): 11/21/2022 4:09:56 PM Caller does not want to go to UC. Requests to talk to someone in the  office for appointment.  Warm transferred caller to backline. Referrals REFERRED TO PCP OFFICE

## 2022-11-23 NOTE — Progress Notes (Signed)
Office Visit Note  Patient: Patricia Underwood             Date of Birth: 28-Nov-1950           MRN: [id number]             PCP: Vivi Barrack, MD Referring: Vivi Barrack, MD Visit Date: 11/24/2022   Subjective:  New Patient (Initial Visit) (Arthritis )   History of Present Illness: Patricia Underwood is a 72 y.o. female here for rheumatoid arthritis and sicca syndrome. She has a history of chronic joint pain in multiple areas with known  osteoarthritis and with fibromyalgia syndrome. There has been some question of sjogren syndrome due to ongoing issues with dry mouth and accelerated dental decay but with negative serology. She saw Dr. Gerilyn Nestle last year for evaluation findings showing no objective peripheral joint synovitis and serum inflammatory markers and RA related antibodies were negative. She has been treated with intermittent prednisone which works well but the symptoms return when off treatment. More recently sustained a fall with associated wrist fracture and rupture of EPL tendon seeing hand surgery for this.  She does have somewhat generalized joint pains and morning stiffness lasting up to about 1 hour.  Typically does not see any obvious joint swelling or effusions.  She is not able to safely take oral nonsteroidal anti-inflammatory drugs. Dry mouth symptoms are pretty bothersome and she has experienced change in taste along with chronic irritation of the tongue.  She has been prescribed pilocarpine previously but did not take the medicine very consistently due to somewhat cost prohibitive and also had increased sweating side effects.  She does not have any particular history with inflammatory eye complications does not generally notice lymphadenopathy or any swelling in the area near major salivary glands.    Activities of Daily Living:  Patient reports morning stiffness for 1 hour.   Patient Reports nocturnal pain.  Difficulty dressing/grooming: Denies Difficulty climbing stairs:  Denies Difficulty getting out of chair: Denies Difficulty using hands for taps, buttons, cutlery, and/or writing: Reports  Review of Systems  Constitutional:  Negative for fatigue.  HENT:  Positive for mouth dryness. Negative for mouth sores.   Eyes:  Positive for dryness.  Respiratory:  Negative for shortness of breath.   Cardiovascular:  Negative for chest pain and palpitations.  Gastrointestinal:  Negative for blood in stool, constipation and diarrhea.  Endocrine: Negative for increased urination.  Genitourinary:  Negative for involuntary urination.  Musculoskeletal:  Positive for joint pain, joint pain, joint swelling and morning stiffness. Negative for gait problem, myalgias, muscle weakness, muscle tenderness and myalgias.  Skin:  Positive for hair loss. Negative for color change, rash and sensitivity to sunlight.  Allergic/Immunologic: Negative for susceptible to infections.  Neurological:  Negative for dizziness and headaches.  Hematological:  Negative for swollen glands.  Psychiatric/Behavioral:  Negative for depressed mood and sleep disturbance. The patient is not nervous/anxious.     PMFS History:  Patient Active Problem List   Diagnosis Date Noted   Osteoarthritis of hand 11/24/2022   Essential hypertension 08/14/2022   Chronic low back pain with sciatica 09/25/2021   Cervical radiculopathy 09/25/2021   Alopecia 09/25/2021   Short-term memory loss 07/23/2021   Rheumatoid arthritis ([place]) 03/28/2021   Migraine 03/28/2021   Knee pain 02/14/2021   Anxiety 02/14/2021   Esophageal spasm    Aortic atherosclerosis (HCC)    Coronary artery disease due to calcified coronary lesion 07/30/2020   IGT (impaired glucose tolerance)  03/20/2020   Chronic pain syndrome 07/04/2019   Dysphasia 04/21/2019   Sicca syndrome ([place]) 04/21/2019   Dental caries 04/21/2019   Osteopenia 10/25/2018   Situational mixed anxiety and depressive disorder 09/30/2018   Fibromyalgia 09/11/2018    Sensorineural hearing loss (SNHL) of both ears, followed by [facility], Arlie Solomons 07/20/2018   Opioid contract exists, [facility] 04/19/2018   Vitamin D deficiency 07/05/2017   B12 deficiency, Rx B12 injections 07/05/2017   Insomnia 07/05/2017   Chronic right shoulder pain 05/30/2017   HLD (hyperlipidemia) 05/30/2017    Past Medical History:  Diagnosis Date   Aortic atherosclerosis ([place])    B12 deficiency, Rx B12 injections 07/05/2017   Chronic back pain    Chronic right shoulder pain 05/30/2017   Coronary artery calcification seen on CAT scan    Esophageal spasm    Fibromyalgia    HLD (hyperlipidemia) 05/30/2017    Family History  Problem Relation Age of Onset   CAD Mother        died of massive MI at 70   CVA Father        x 2   Hyperlipidemia Sister    Diabetes Neg Hx    Past Surgical History:  Procedure Laterality Date   ABDOMINAL HYSTERECTOMY     KNEE SURGERY Right    LUMBAR FUSION     L4-L5   SHOULDER ARTHROSCOPY Right    Social History   Social History Narrative   Not on file   Immunization History  Administered Date(s) Administered   Influenza-Unspecified 11/10/2018   PFIZER Comirnaty(Gray Top)Covid-19 Tri-Sucrose Vaccine 12/13/2019, 01/03/2020   PFIZER(Purple Top)SARS-COV-2 Vaccination 12/13/2019, 01/03/2020, 08/07/2020, 05/30/2021, 09/06/2021   Tdap 05/31/2013, 03/30/2022     Objective: Vital Signs: BP 126/76 (BP Location: Right Arm, Patient Position: Sitting, Cuff Size: Normal)   Pulse 87   Resp 16   Ht 5\' 2"  (1.575 m)   Wt 114 lb (51.7 kg)   BMI 20.85 kg/m    Physical Exam HENT:     Mouth/Throat:     Mouth: Mucous membranes are dry.     Pharynx: Oropharynx is clear.     Comments: Flattening of lingual papillae Eyes:     Conjunctiva/sclera: Conjunctivae normal.  Cardiovascular:     Rate and Rhythm: Normal rate and regular rhythm.  Pulmonary:     Effort: Pulmonary effort is normal.     Breath sounds: Normal breath sounds.   Lymphadenopathy:     Cervical: No cervical adenopathy.  Skin:    General: Skin is warm and dry.  Neurological:     Mental Status: She is alert.  Psychiatric:        Mood and Affect: Mood normal.      Musculoskeletal Exam:  Shoulders full ROM no tenderness or swelling Elbows full ROM no tenderness or swelling Wrists full ROM no tenderness or swelling Right thumb tenderness to pressure, unable to extend and abduct and tenderness to pressure, squaring of right 1st CMC as well, DIP heberdon's nodes most advanced 2nd and 3rd fingers Knees full ROM no tenderness or swelling Bony nodule on dorsal side of midfoot worse on left than right MTPs full ROM no tenderness or swelling   Investigation: No additional findings.  Imaging: No results found.  Recent Labs: Lab Results  Component Value Date   WBC 6.3 01/27/2022   HGB 13.4 01/27/2022   PLT 318.0 01/27/2022   NA 139 01/27/2022   K 4.5 01/27/2022   CL 103 01/27/2022  CO2 28 01/27/2022   GLUCOSE 95 01/27/2022   BUN 16 01/27/2022   CREATININE 0.72 01/27/2022   BILITOT 0.5 01/27/2022   ALKPHOS 72 01/27/2022   AST 20 01/27/2022   ALT 19 01/27/2022   PROT 6.4 01/27/2022   ALBUMIN 4.3 01/27/2022   CALCIUM 8.9 01/27/2022    Speciality Comments: No specialty comments available.  Procedures:  No procedures performed Allergies: Ambien [zolpidem tartrate], Flexeril [cyclobenzaprine], Toradol [ketorolac tromethamine], and Naproxen   Assessment / Plan:     Visit Diagnoses: Rheumatoid arthritis of multiple sites with negative rheumatoid factor (HCC)  Chronic low back pain with sciatica, sciatica laterality unspecified, unspecified back pain laterality - Plan: DULoxetine (CYMBALTA) 30 MG capsule  Unable to really confirm rheumatoid arthritis diagnosis at this time there is no peripheral joint synovitis.  Tendon rupture issue could be associated with inflammatory disease activity but sounds more mechanical in her case.  Provided  information on supplement and topical treatment options for osteoarthritis pain management also recommend start duloxetine 30 mg daily for her fibromyalgia and arthritis pain.  Fibromyalgia  Currently does not seem be too bad or exacerbation with pretty low amount of the widespread systemic symptoms troubles more focused to her joint pain and sicca symptoms.  Sicca syndrome (HCC) - Plan: pilocarpine (SALAGEN) 5 MG tablet  Does have some chronic changes discussed importance of maintaining good hydration and dry mouth association with pain taste change dental issues and swallowing issues.  Will send new prescription for pilocarpine reviewed this is much more affordable with good Rx and will try going up to actually taking this twice daily.   Orders: No orders of the defined types were placed in this encounter.  Meds ordered this encounter  Medications   DULoxetine (CYMBALTA) 30 MG capsule    Sig: Take 1 capsule (30 mg total) by mouth daily.    Dispense:  30 capsule    Refill:  1   pilocarpine (SALAGEN) 5 MG tablet    Sig: Take 1 tablet (5 mg total) by mouth 2 (two) times daily as needed.    Dispense:  60 tablet    Refill:  1     Follow-Up Instructions: Return in about 4 weeks (around 12/22/2022) for New pt OA/?RA/?pSS f/u 4wks.   Fuller Plan, MD  Note - This record has been created using [employer].  Chart creation errors have been sought, but may not always  have been located. Such creation errors do not reflect on  the standard of medical care.

## 2022-11-24 ENCOUNTER — Ambulatory Visit: Payer: Medicare Other | Attending: Internal Medicine | Admitting: Internal Medicine

## 2022-11-24 ENCOUNTER — Telehealth: Payer: Self-pay | Admitting: Family Medicine

## 2022-11-24 ENCOUNTER — Encounter: Payer: Self-pay | Admitting: Internal Medicine

## 2022-11-24 VITALS — BP 126/76 | HR 87 | Resp 16 | Ht 62.0 in | Wt 114.0 lb

## 2022-11-24 DIAGNOSIS — M19042 Primary osteoarthritis, left hand: Secondary | ICD-10-CM | POA: Insufficient documentation

## 2022-11-24 DIAGNOSIS — M797 Fibromyalgia: Secondary | ICD-10-CM

## 2022-11-24 DIAGNOSIS — M5440 Lumbago with sciatica, unspecified side: Secondary | ICD-10-CM | POA: Diagnosis not present

## 2022-11-24 DIAGNOSIS — M19041 Primary osteoarthritis, right hand: Secondary | ICD-10-CM | POA: Diagnosis not present

## 2022-11-24 DIAGNOSIS — M3500 Sicca syndrome, unspecified: Secondary | ICD-10-CM | POA: Diagnosis not present

## 2022-11-24 DIAGNOSIS — G8929 Other chronic pain: Secondary | ICD-10-CM

## 2022-11-24 DIAGNOSIS — M0609 Rheumatoid arthritis without rheumatoid factor, multiple sites: Secondary | ICD-10-CM | POA: Diagnosis not present

## 2022-11-24 DIAGNOSIS — M19049 Primary osteoarthritis, unspecified hand: Secondary | ICD-10-CM | POA: Insufficient documentation

## 2022-11-24 MED ORDER — DULOXETINE HCL 30 MG PO CPEP: 30.0000 mg | ORAL_CAPSULE | Freq: Every day | ORAL | 1 refills | Status: DC

## 2022-11-24 MED ORDER — PILOCARPINE HCL 5 MG PO TABS: 5.0000 mg | ORAL_TABLET | Freq: Two times a day (BID) | ORAL | 1 refills | Status: DC | PRN

## 2022-11-24 NOTE — Patient Instructions (Addendum)
For osteoarthritis several treatments may be beneficial:  - Topical antiinflammatory medicine such as diclofenac or Voltaren can be applied to  affected area as needed. Topical analgesics containing CBD, menthol, or lidocaine can be tried.  - Oral nonsteroidal antiinflammatory drugs (NSAIDs) such as ibuprofen, aleve, celebrex, or mobic are usually helpful for osteoarthritis. These should be taken intermittently or as needed, and always taken with food.  - Turmeric has some antiinflammatory effect similar to NSAIDs and may help, if taken as a supplement should not be taken above recommended doses.   - Compressive gloves or sleeve can be helpful to support the joint especially if hurting or swelling with certain activities.  - Physical therapy referral can discuss exercises or activity modification to improve symptoms or strength if needed.  - Local steroid injection is an option if symptoms become worse and not controlled by the above options.    I recommend looking into the [facility] program for the pilocarpine appears to be a much lower out of pocket cost with the pilocarpine for dry mouth symptoms.

## 2022-11-24 NOTE — Telephone Encounter (Signed)
Pt would like a call back about her osteoporosis diagnosis, she would like to start meds for it but it needs to be diagnosed in the chart so insurance can pay. Please advise.

## 2022-11-25 ENCOUNTER — Encounter: Payer: Self-pay | Admitting: Family Medicine

## 2022-11-25 NOTE — Telephone Encounter (Signed)
Patient called back for an update. Requests a call from Junious Dresser to discuss this and her BP issue.

## 2022-11-26 NOTE — Telephone Encounter (Signed)
See note

## 2022-11-26 NOTE — Telephone Encounter (Signed)
Please have her increase amlodipine to 10 mg daily and have her follow-up with [place] soon for recheck.  Please check on status of her prolia PA.  We discussed this at her last office visit.

## 2022-11-27 ENCOUNTER — Encounter (HOSPITAL_BASED_OUTPATIENT_CLINIC_OR_DEPARTMENT_OTHER): Payer: Self-pay | Admitting: Family

## 2022-11-27 ENCOUNTER — Ambulatory Visit (INDEPENDENT_AMBULATORY_CARE_PROVIDER_SITE_OTHER): Payer: Medicare Other | Admitting: Family

## 2022-11-27 VITALS — BP 142/80 | HR 81 | Ht 62.0 in | Wt 115.0 lb

## 2022-11-27 DIAGNOSIS — I2510 Atherosclerotic heart disease of native coronary artery without angina pectoris: Secondary | ICD-10-CM

## 2022-11-27 DIAGNOSIS — E785 Hyperlipidemia, unspecified: Secondary | ICD-10-CM | POA: Diagnosis not present

## 2022-11-27 DIAGNOSIS — I10 Essential (primary) hypertension: Secondary | ICD-10-CM

## 2022-11-27 DIAGNOSIS — E559 Vitamin D deficiency, unspecified: Secondary | ICD-10-CM | POA: Diagnosis not present

## 2022-11-27 NOTE — Progress Notes (Unsigned)
Office Visit    Patient Name: Patricia Underwood Date of Encounter: 12/03/2022  PCP:  Vivi Barrack, [place]  Cardiologist:  Buford Dresser, MD  Advanced Practice Provider:  No care team member to display Electrophysiologist:  None      Chief Complaint    Patricia Underwood is a 72 y.o. female presents today for blood pressure concerns.    Past Medical History    Past Medical History:  Diagnosis Date   Aortic atherosclerosis ([place])    B12 deficiency, Rx B12 injections 07/05/2017   Chronic back pain    Chronic right shoulder pain 05/30/2017   Coronary artery calcification seen on CAT scan    Esophageal spasm    Fibromyalgia    HLD (hyperlipidemia) 05/30/2017   Past Surgical History:  Procedure Laterality Date   ABDOMINAL HYSTERECTOMY     KNEE SURGERY Right    LUMBAR FUSION     L4-L5   SHOULDER ARTHROSCOPY Right     Allergies  Allergies  Allergen Reactions   Ambien [Zolpidem Tartrate]    Flexeril [Cyclobenzaprine] Other (See Comments)    Patient unsure of reaction   Toradol [Ketorolac Tromethamine]    Naproxen Other (See Comments)    Patient unsure of reaction.    History of Present Illness    Patricia Underwood is a 72 y.o. female with a hx of HLD, coronary calcification on T, aorthic atherosclerosis last seen 07/31/20 by Dr. Radford Pax.  07/2020 coronary calcium score of 277 placing her in 87th percentile. Echo 07/2020 normal LVEF, gr1DD, trivial MR.   She contacted primary care 11/25/22 noting elevated blood pressure and was recommended to increase her Amlodipine.  She has not yet made this change.  Presents today independently. She notes she is concerned about labile blood pressure at home with readings such as 172/86, 148/74, 136/72.  Understandably concerned about her cardiovascular risk as her mother had a major heart attack at 33 years old and her father had a stroke.  Notes when she took atorvastatin had issues with her legs such as  cramps.  EKGs/Labs/Other Studies Reviewed:   The following studies were reviewed today:   EKG:  EKG is ordered today.  The ekg ordered today demonstrates NSR 81 bpm with no acute ST/T wave changes.   Recent Labs: 01/27/2022: ALT 19; BUN 16; Creatinine, Ser 0.72; Hemoglobin 13.4; Platelets 318.0; Potassium 4.5; Sodium 139; TSH 4.31  Recent Lipid Panel    Component Value Date/Time   CHOL 240 (H) 05/30/2021 0842   CHOL 191 09/13/2020 1059   TRIG 79.0 05/30/2021 0842   HDL 88.70 05/30/2021 0842   HDL 120 09/13/2020 1059   CHOLHDL 3 05/30/2021 0842   VLDL 15.8 05/30/2021 0842   LDLCALC 136 (H) 05/30/2021 0842   LDLCALC 99 03/01/2021 0750   Home Medications   Current Meds  Medication Sig   amLODipine (NORVASC) 5 MG tablet Take 5 mg by mouth daily.   atorvastatin (LIPITOR) 20 MG tablet Take 20 mg by mouth daily.   cyanocobalamin (VITAMIN B12) 1000 MCG/ML injection 1000 mcg (1 mg) injection once every other week   DULoxetine (CYMBALTA) 30 MG capsule Take 1 capsule (30 mg total) by mouth daily.   pilocarpine (SALAGEN) 5 MG tablet Take 1 tablet (5 mg total) by mouth 2 (two) times daily as needed.   triazolam (HALCION) 0.25 MG tablet Take 1 tablet (0.25 mg total) by mouth at bedtime as needed. for sleep   Vitamin  D, Ergocalciferol, (DRISDOL) 1.25 MG (50000 UNIT) CAPS capsule Take 1 capsule (50,000 Units total) by mouth every 7 (seven) days.   Current Facility-Administered Medications for the 11/27/22 encounter (Office Visit) with Loel Dubonnet, NP  Medication   denosumab (PROLIA) injection 60 mg     Review of Systems      All other systems reviewed and are otherwise negative except as noted above.  Physical Exam    VS:  BP (!) 142/80   Pulse 81   Ht 5\' 2"  (1.575 m)   Wt 115 lb (52.2 kg)   BMI 21.03 kg/m  , BMI Body mass index is 21.03 kg/m.  Wt Readings from Last 3 Encounters:  11/27/22 115 lb (52.2 kg)  11/24/22 114 lb (51.7 kg)  11/06/22 114 lb (51.7 kg)     GEN:  Well nourished, well developed, in no acute distress. HEENT: normal. Neck: Supple, no JVD, carotid bruits, or masses. Cardiac: RRR, no murmurs, rubs, or gallops. No clubbing, cyanosis, edema.  Radials/PT 2+ and equal bilaterally.  Respiratory:  Respirations regular and unlabored, clear to auscultation bilaterally. GI: Soft, nontender, nondistended. MS: No deformity or atrophy. Skin: Warm and dry, no rash. Neuro:  Strength and sensation are intact. Psych: Normal affect.  Assessment & Plan    Hypertension - Blood pressure labile. Recommend labs including catecholamines, metanephrines, TSH, cortisol to rule out secondary hypertension given her very labile blood pressure.  She prefers to await lab results prior to making medication changes.  Encouraged her to bring blood pressure cuff to follow-up to ensure accuracy.  Could consider increased dose of amlodipine versus addition of second agent. Discussed to monitor BP at home at least 2 hours after medications and sitting for 5-10 minutes.   Vitamin D deficiency -requests we update labs and will add on vitamin D testing to her upcoming lab work.  Coronary calcification on CT -reports no chest pain, exertional dyspnea.  If noted could consider ischemic evaluation with My[facility] in the future.  We discussed symptoms to monitor for such as exertional chest pain or dyspnea.  HLD, LDL goal <70 -update CMP and lipid panel     Disposition: Follow up  as scheduled  with Buford Dresser, MD or APP.  Signed, Loel Dubonnet, NP 12/03/2022, 2:27 PM [place]

## 2022-11-27 NOTE — Patient Instructions (Signed)
Medication Instructions:  Your Physician recommend you continue on your current medication as directed.    We may change medications based on your lab work!   *If you need a refill on your cardiac medications before your next appointment, please call your pharmacy*   Lab Work: Please return for Lab work on Monday or Tuesday fasting for Lipid Panel, CMP, CBC, TSH, Vitamin D, Catecholamines, Metanephrines, cortisol and Renin- aldosterone. You may come to the...   [facility] (3rd floor) [address], [place], [place] [address]  Open: 8am-Noon and 1pm-4:30pm  Please ring the doorbell on the small table when you exit the elevator and the Lab Tech will come get you  [place] [facility] [address] [place], [place], [place] [address] Open: 8am-1pm, then 2pm-4:30pm   [place]- Please see attached locations sheet stapled to your lab work with address and hours.   If you have labs (blood work) drawn today and your tests are completely normal, you will receive your results only by: [place] (if you have MyChart) OR A paper copy in the mail If you have any lab test that is abnormal or we need to change your treatment, we will call you to review the results.  Follow-Up: At [facility], you and your health needs are our priority.  As part of our continuing mission to provide you with exceptional heart care, we have created designated Provider Care Teams.  These Care Teams include your primary Cardiologist (physician) and Advanced Practice Providers (APPs -  Physician Assistants and Nurse Practitioners) who all work together to provide you with the care you need, when you need it.  We recommend signing up for the patient portal called "MyChart".  Sign up information is provided on this After Visit Summary.  MyChart is used to connect with patients for Virtual Visits (Telemedicine).  Patients are able to view lab/test results,  encounter notes, upcoming appointments, etc.  Non-urgent messages can be sent to your provider as well.   To learn more about what you can do with MyChart, go to [email or web address].    Your next appointment:   Follow up as scheduled with Dr. Harrell Gave   Other Instructions Tips to Measure your Blood Pressure Correctly  To determine whether you have hypertension, a medical professional will take a blood pressure reading. How you prepare for the test, the position of your arm, and other factors can change a blood pressure reading by 10% or more. That could be enough to hide high blood pressure, start you on a drug you don't really need, or lead your doctor to incorrectly adjust your medications.  National and international guidelines offer specific instructions for measuring blood pressure. If a doctor, nurse, or medical assistant isn't doing it right, don't hesitate to ask him or her to get with the guidelines.  Here's what you can do to ensure a correct reading:  Don't drink a caffeinated beverage or smoke during the 30 minutes before the test.  Sit quietly for five minutes before the test begins.  During the measurement, sit in a chair with your feet on the floor and your arm supported so your elbow is at about heart level.  The inflatable part of the cuff should completely cover at least 80% of your upper arm, and the cuff should be placed on bare skin, not over a shirt.  Don't talk during the measurement.  Have your blood pressure measured twice, with a brief break in  between. If the readings are different by 5 points or more, have it done a third time.  In 2017, new guidelines from the [place], the [employer] of Cardiology, and nine other health organizations lowered the diagnosis of high blood pressure to 130/80 mm Hg or higher for all adults. The guidelines also redefined the various blood pressure categories to now include normal, elevated, Stage 1  hypertension, Stage 2 hypertension, and hypertensive crisis (see "Blood pressure categories").  Blood pressure categories  Blood pressure category SYSTOLIC (upper number)  DIASTOLIC (lower number)  Normal Less than 120 mm Hg and Less than 80 mm Hg  Elevated 120-129 mm Hg and Less than 80 mm Hg  High blood pressure: Stage 1 hypertension 130-139 mm Hg or 80-89 mm Hg  High blood pressure: Stage 2 hypertension 140 mm Hg or higher or 90 mm Hg or higher  Hypertensive crisis (consult your doctor immediately) Higher than 180 mm Hg and/or Higher than 120 mm Hg  Source: American Heart Association and American Stroke Association. For more on getting your blood pressure under control, buy Controlling Your Blood Pressure, a Special Health Report from [facility].   Blood Pressure Log   Date   Time  Blood Pressure  Position  Example: Nov 1 9 AM 124/78 sitting

## 2022-11-28 NOTE — Telephone Encounter (Signed)
Send my chart message to patient.

## 2022-12-03 ENCOUNTER — Encounter (HOSPITAL_BASED_OUTPATIENT_CLINIC_OR_DEPARTMENT_OTHER): Payer: Self-pay | Admitting: Family

## 2022-12-03 NOTE — Progress Notes (Signed)
Change meds based on labs per patient preference

## 2022-12-08 DIAGNOSIS — Z23 Encounter for immunization: Secondary | ICD-10-CM | POA: Diagnosis not present

## 2022-12-09 NOTE — Telephone Encounter (Signed)
Pt would like a call back pertaining Prolia and the status. Please advise.

## 2022-12-11 ENCOUNTER — Telehealth (INDEPENDENT_AMBULATORY_CARE_PROVIDER_SITE_OTHER): Payer: Medicare Other | Admitting: Physician Assistant

## 2022-12-11 ENCOUNTER — Encounter: Payer: Self-pay | Admitting: Physician Assistant

## 2022-12-11 VITALS — Wt 115.0 lb

## 2022-12-11 DIAGNOSIS — I10 Essential (primary) hypertension: Secondary | ICD-10-CM | POA: Diagnosis not present

## 2022-12-11 NOTE — Progress Notes (Signed)
   Virtual Visit via Video Note  I connected with  Patricia Underwood  on 12/11/22 at  8:30 AM EST by a video enabled telemedicine application and verified that I am speaking with the correct person using two identifiers.  Location: Patient: home Provider: Therapist, [facility] at [place] present: Patient and myself   I discussed the limitations of evaluation and management by telemedicine and the availability of in person appointments. The patient expressed understanding and agreed to proceed.   History of Present Illness:  72 yo female presents for virtual visit to discuss concerns about blood pressure. States that she has an Appt Feb 20th with cardiology.  She was recently prescribed amlodipine; states last week both feet and ankles were very swollen and could only wear sandals; resolved after stopping amlodipine 5 mg. Yesterday morning 126/68 at 8:30 am; 114/60 at 10 am.   She had been on prednisone recently because of arthritis pain.  Surgery is scheduled for Monday since fall on wrist.     Observations/Objective:   Gen: Awake, alert, no acute distress Resp: Breathing is even and non-labored Psych: calm/pleasant demeanor Neuro: Alert and Oriented x 3, + facial symmetry, speech is clear.   Assessment and Plan:  1. Essential hypertension Agreed with patient, amlodipine was causing swelling, glad this is resolved. BP readings from yesterday normotensive. Advised to continue monitoring at home, limit salt, keep f/up with cardiologist as scheduled. Reach out if BP's consistently >140s/90s.   Video connection was lost at >50% of the duration of the visit, at which time the remainder of the visit was completed via audio only.  Total time on the phone was 9 minutes.    Follow Up Instructions:    I discussed the assessment and treatment plan with the patient. The patient was provided an opportunity to ask questions and all were answered. The patient agreed with the  plan and demonstrated an understanding of the instructions.   The patient was advised to call back or seek an in-person evaluation if the symptoms worsen or if the condition fails to improve as anticipated.   M , PA-C

## 2022-12-12 NOTE — Telephone Encounter (Deleted)
Can someone please check on the Prolia status for this patient?

## 2022-12-12 NOTE — Telephone Encounter (Signed)
Can you please check on Rx Prolia

## 2022-12-12 NOTE — Telephone Encounter (Signed)
Can someone please check on the Prolia status for this patient? 

## 2022-12-15 DIAGNOSIS — S66202A Unspecified injury of extensor muscle, fascia and tendon of left thumb at wrist and hand level, initial encounter: Secondary | ICD-10-CM | POA: Diagnosis not present

## 2022-12-15 DIAGNOSIS — S66212A Strain of extensor muscle, fascia and tendon of left thumb at wrist and hand level, initial encounter: Secondary | ICD-10-CM | POA: Diagnosis not present

## 2022-12-15 DIAGNOSIS — X58XXXA Exposure to other specified factors, initial encounter: Secondary | ICD-10-CM | POA: Diagnosis not present

## 2022-12-15 DIAGNOSIS — Y999 Unspecified external cause status: Secondary | ICD-10-CM | POA: Diagnosis not present

## 2022-12-15 HISTORY — PX: LIGAMENT REPAIR: SHX5444

## 2022-12-16 ENCOUNTER — Other Ambulatory Visit (HOSPITAL_COMMUNITY): Payer: Self-pay

## 2022-12-16 ENCOUNTER — Telehealth: Payer: Self-pay

## 2022-12-16 NOTE — Telephone Encounter (Signed)
Prolia VOB initiated via [email or web address] 

## 2022-12-17 NOTE — Telephone Encounter (Signed)
Pt ready for scheduling on or after 12/17/22  Out-of-pocket cost due at time of visit: $0  Primary: Medicare Prolia co-insurance: 0% (approximately $302) Admin fee co-insurance: 0% (approximately $25)  Deductible: $240 met of $240 required  Secondary: [employer] Prolia co-insurance:  Admin fee co-insurance:    Prior Auth: Not required PA# Valid:   ** This summary of benefits is an estimation of the patient's out-of-pocket cost. Exact cost may vary based on individual plan coverage.

## 2022-12-17 NOTE — Telephone Encounter (Signed)
Pt sch for prolia on 2/14 at 1030am

## 2022-12-17 NOTE — Telephone Encounter (Signed)
Please schedule Nurse visit for Prolia inj

## 2022-12-18 NOTE — Telephone Encounter (Signed)
Pt sch for prolia on 2/14 at 1030am  

## 2022-12-24 ENCOUNTER — Ambulatory Visit (INDEPENDENT_AMBULATORY_CARE_PROVIDER_SITE_OTHER): Payer: Medicare Other

## 2022-12-24 ENCOUNTER — Other Ambulatory Visit (INDEPENDENT_AMBULATORY_CARE_PROVIDER_SITE_OTHER): Payer: Medicare Other

## 2022-12-24 DIAGNOSIS — M810 Age-related osteoporosis without current pathological fracture: Secondary | ICD-10-CM | POA: Diagnosis not present

## 2022-12-24 DIAGNOSIS — I10 Essential (primary) hypertension: Secondary | ICD-10-CM | POA: Diagnosis not present

## 2022-12-24 DIAGNOSIS — E7800 Pure hypercholesterolemia, unspecified: Secondary | ICD-10-CM | POA: Diagnosis not present

## 2022-12-24 DIAGNOSIS — E559 Vitamin D deficiency, unspecified: Secondary | ICD-10-CM

## 2022-12-24 LAB — CBC WITH DIFFERENTIAL/PLATELET
Basophils Absolute: 0 10*3/uL (ref 0.0–0.1)
Basophils Relative: 0.6 % (ref 0.0–3.0)
Eosinophils Absolute: 0.2 10*3/uL (ref 0.0–0.7)
Eosinophils Relative: 2.3 % (ref 0.0–5.0)
HCT: 42.1 % (ref 36.0–46.0)
Hemoglobin: 14.2 g/dL (ref 12.0–15.0)
Lymphocytes Relative: 25.2 % (ref 12.0–46.0)
Lymphs Abs: 2.2 10*3/uL (ref 0.7–4.0)
MCHC: 33.6 g/dL (ref 30.0–36.0)
MCV: 92.6 fl (ref 78.0–100.0)
Monocytes Absolute: 0.8 10*3/uL (ref 0.1–1.0)
Monocytes Relative: 8.7 % (ref 3.0–12.0)
Neutro Abs: 5.5 10*3/uL (ref 1.4–7.7)
Neutrophils Relative %: 63.2 % (ref 43.0–77.0)
Platelets: 362 10*3/uL (ref 150.0–400.0)
RBC: 4.54 Mil/uL (ref 3.87–5.11)
RDW: 12.9 % (ref 11.5–15.5)
WBC: 8.7 10*3/uL (ref 4.0–10.5)

## 2022-12-24 LAB — LIPID PANEL
Cholesterol: 209 mg/dL — ABNORMAL HIGH (ref 0–200)
HDL: 85.5 mg/dL (ref >39.00)
LDL Cholesterol: 105 mg/dL — ABNORMAL HIGH (ref 0–99)
NonHDL: 123.01
Total CHOL/HDL Ratio: 2
Triglycerides: 88 mg/dL (ref 0.0–149.0)
VLDL: 17.6 mg/dL (ref 0.0–40.0)

## 2022-12-24 LAB — COMPREHENSIVE METABOLIC PANEL
ALT: 11 U/L (ref 0–35)
AST: 15 U/L (ref 0–37)
Albumin: 4 g/dL (ref 3.5–5.2)
Alkaline Phosphatase: 87 U/L (ref 39–117)
BUN: 12 mg/dL (ref 6–23)
CO2: 30 mEq/L (ref 19–32)
Calcium: 9.5 mg/dL (ref 8.4–10.5)
Chloride: 104 mEq/L (ref 96–112)
Creatinine, Ser: 0.58 mg/dL (ref 0.40–1.20)
GFR: 90.69 mL/min (ref >60.00)
Glucose, Bld: 89 mg/dL (ref 70–99)
Potassium: 4.9 mEq/L (ref 3.5–5.1)
Sodium: 141 mEq/L (ref 135–145)
Total Bilirubin: 0.5 mg/dL (ref 0.2–1.2)
Total Protein: 6.3 g/dL (ref 6.0–8.3)

## 2022-12-24 LAB — TSH: TSH: 1.72 u[IU]/mL (ref 0.35–5.50)

## 2022-12-24 LAB — CORTISOL: Cortisol, Plasma: 5.3 ug/dL

## 2022-12-24 MED ORDER — DENOSUMAB 60 MG/ML ~~LOC~~ SOSY
60.0000 mg | PREFILLED_SYRINGE | Freq: Once | SUBCUTANEOUS | Status: AC
  Administered 2022-12-24: 60 mg via SUBCUTANEOUS

## 2022-12-24 NOTE — Progress Notes (Signed)
Patricia Underwood 72 yr old female presents to office today for PROLIA injection per Dimas Chyle, MD. Administered DENOSUMAB 60 mg/mL subcutaneous right arm. Patient tolerated well.

## 2022-12-24 NOTE — Addendum Note (Signed)
Addended by: Doran Clay A on: 12/24/2022 11:27 AM   Modules accepted: Orders

## 2022-12-25 ENCOUNTER — Telehealth (HOSPITAL_BASED_OUTPATIENT_CLINIC_OR_DEPARTMENT_OTHER): Payer: Self-pay

## 2022-12-25 DIAGNOSIS — E785 Hyperlipidemia, unspecified: Secondary | ICD-10-CM

## 2022-12-25 MED ORDER — ATORVASTATIN CALCIUM 40 MG PO TABS: 40.0000 mg | ORAL_TABLET | Freq: Every day | ORAL | 3 refills | Status: DC

## 2022-12-25 NOTE — Telephone Encounter (Addendum)
Called results to patient and left results on VM (ok per DPR), instructions left to call office back if patient has any questions!     ----- Message from Loel Dubonnet, NP sent at 12/25/2022 12:51 PM EST ----- CBC with no evidence of anemia nor infection.  Normal kidneys, liver, electrolytes.  Normal thyroid function.   Cholesterol panel shows LDL not at goal less than 70.  Recommend increase atorvastatin to 40 mg daily with repeat LFT/FLP in 3 months.

## 2022-12-26 DIAGNOSIS — M25642 Stiffness of left hand, not elsewhere classified: Secondary | ICD-10-CM | POA: Diagnosis not present

## 2022-12-26 DIAGNOSIS — Z4889 Encounter for other specified surgical aftercare: Secondary | ICD-10-CM | POA: Diagnosis not present

## 2022-12-26 DIAGNOSIS — M25542 Pain in joints of left hand: Secondary | ICD-10-CM | POA: Diagnosis not present

## 2022-12-26 DIAGNOSIS — S66812A Strain of other specified muscles, fascia and tendons at wrist and hand level, left hand, initial encounter: Secondary | ICD-10-CM | POA: Diagnosis not present

## 2022-12-29 ENCOUNTER — Ambulatory Visit: Payer: Medicare Other | Admitting: Internal Medicine

## 2022-12-30 ENCOUNTER — Other Ambulatory Visit (HOSPITAL_BASED_OUTPATIENT_CLINIC_OR_DEPARTMENT_OTHER): Payer: Self-pay | Admitting: *Deleted

## 2022-12-30 ENCOUNTER — Encounter (HOSPITAL_BASED_OUTPATIENT_CLINIC_OR_DEPARTMENT_OTHER): Payer: Self-pay | Admitting: Cardiology

## 2022-12-30 ENCOUNTER — Ambulatory Visit (INDEPENDENT_AMBULATORY_CARE_PROVIDER_SITE_OTHER): Payer: Medicare Other | Admitting: Cardiology

## 2022-12-30 VITALS — BP 100/58 | HR 75 | Ht 62.0 in | Wt 114.8 lb

## 2022-12-30 DIAGNOSIS — Z8249 Family history of ischemic heart disease and other diseases of the circulatory system: Secondary | ICD-10-CM | POA: Diagnosis not present

## 2022-12-30 DIAGNOSIS — E7800 Pure hypercholesterolemia, unspecified: Secondary | ICD-10-CM | POA: Diagnosis not present

## 2022-12-30 DIAGNOSIS — Z7189 Other specified counseling: Secondary | ICD-10-CM

## 2022-12-30 DIAGNOSIS — R0989 Other specified symptoms and signs involving the circulatory and respiratory systems: Secondary | ICD-10-CM

## 2022-12-30 DIAGNOSIS — I2510 Atherosclerotic heart disease of native coronary artery without angina pectoris: Secondary | ICD-10-CM

## 2022-12-30 MED ORDER — ATORVASTATIN CALCIUM 20 MG PO TABS: 20.0000 mg | ORAL_TABLET | Freq: Every day | ORAL | 3 refills | Status: DC

## 2022-12-30 NOTE — Patient Instructions (Signed)
Medication Instructions:  The current medical regimen is effective;  continue present plan and medications.  *If you need a refill on your cardiac medications before your next appointment, please call your pharmacy*   Lab Work: Lipids and Liver function   If you have labs (blood work) drawn today and your tests are completely normal, you will receive your results only by: [place] (if you have MyChart) OR A paper copy in the mail If you have any lab test that is abnormal or we need to change your treatment, we will call you to review the results.   Testing/Procedures: None   Follow-Up: At [facility], you and your health needs are our priority.  As part of our continuing mission to provide you with exceptional heart care, we have created designated Provider Care Teams.  These Care Teams include your primary Cardiologist (physician) and Advanced Practice Providers (APPs -  Physician Assistants and Nurse Practitioners) who all work together to provide you with the care you need, when you need it.  We recommend signing up for the patient portal called "MyChart".  Sign up information is provided on this After Visit Summary.  MyChart is used to connect with patients for Virtual Visits (Telemedicine).  Patients are able to view lab/test results, encounter notes, upcoming appointments, etc.  Non-urgent messages can be sent to your provider as well.   To learn more about what you can do with MyChart, go to [email or web address].    Your next appointment:   1 year(s)  Provider:   Buford Dresser, MD    Other Instructions None

## 2022-12-30 NOTE — Progress Notes (Signed)
Cardiology Office Note:    Date:  12/31/2022   ID:  Patricia Underwood, DOB [birth year], MRN [id number]  PCP:  Vivi Barrack, MD  Cardiologist:  Buford Dresser, MD PhD  Referring MD: Vivi Barrack, MD   CC: follow up  History of Present Illness:    Patricia Underwood is a 72 y.o. female with a hx of hyperlipidemia who is seen for follow up today. I initially met her 07/28/2018 as a new consult at the request of Vivi Barrack, MD for the evaluation and management of coronary artery calcium seen on CT scan.   Today she mentioned that she has not been consistent with her statin medications as was recommended to her. She had not been taking the medication often in the 2 months prior to her blood work being done. She has been on the 20 mg of the atorvastatin, but never increased it to 40 mg. She would like to work on taking the 20 mg dose more consistently and then rechecking labs prior to increasing the dose.  She had an ultrasound of her arteries and would like to discuss because she was not sure what the results were. Discussed her Bilateral Carotid Doppler as well as her Coronary Calcium score.   She denies any palpitations, chest pain, shortness of breath, or peripheral edema. No lightheadedness, headaches, syncope, orthopnea, or PND.   Past Medical History:  Diagnosis Date   Aortic atherosclerosis ([place])    B12 deficiency, Rx B12 injections 07/05/2017   Chronic back pain    Chronic right shoulder pain 05/30/2017   Coronary artery calcification seen on CAT scan    Esophageal spasm    Fibromyalgia    HLD (hyperlipidemia) 05/30/2017    Past Surgical History:  Procedure Laterality Date   ABDOMINAL HYSTERECTOMY     KNEE SURGERY Right    LUMBAR FUSION     L4-L5   SHOULDER ARTHROSCOPY Right     Current Medications: Current Outpatient Medications on File Prior to Visit  Medication Sig   cyanocobalamin (VITAMIN B12) 1000 MCG/ML injection 1000 mcg (1 mg) injection once every other week    pilocarpine (SALAGEN) 5 MG tablet Take 1 tablet (5 mg total) by mouth 2 (two) times daily as needed.   triazolam (HALCION) 0.25 MG tablet Take 1 tablet (0.25 mg total) by mouth at bedtime as needed. for sleep   Vitamin D, Ergocalciferol, (DRISDOL) 1.25 MG (50000 UNIT) CAPS capsule Take 1 capsule (50,000 Units total) by mouth every 7 (seven) days.   Current Facility-Administered Medications on File Prior to Visit  Medication   denosumab (PROLIA) injection 60 mg     Allergies:   Ambien [zolpidem tartrate], Amlodipine, Flexeril [cyclobenzaprine], Toradol [ketorolac tromethamine], and Naproxen   Social History   Tobacco Use   Smoking status: Former    Types: Cigarettes    Passive exposure: Never   Smokeless tobacco: Never   Tobacco comments:    quit in [place] 2021  Vaping Use   Vaping Use: Never used  Substance Use Topics   Alcohol use: Yes    Alcohol/week: 7.0 standard drinks of alcohol    Types: 7 Glasses of wine per week   Drug use: No    Family History: The patient's FH notable for: says "all" of her family members have high cholesterol. One of her children has a Tchol >300. Family members with MI, CVA.  ROS:   Please see the history of present illness.  All other systems are reviewed and  negative.   EKGs/Labs/Other Studies Reviewed:    The following studies were reviewed today: Bilateral Carotid Doppler 07/26/2021: IMPRESSION: 1. Bilateral carotid bifurcation plaque resulting in less than 50% diameter ICA stenosis. 2. Antegrade bilateral vertebral arterial flow.  Coronary Calcium Score 08/08/2020: IMPRESSION: Coronary calcium score of 277. This was 87th percentile for age and sex matched control.  ECHO 08/07/2020: IMPRESSIONS    1. Left ventricular ejection fraction, by estimation, is 55 to 60%. The  left ventricle has normal function. The left ventricle has no regional  wall motion abnormalities. Left ventricular diastolic parameters are  consistent with Grade  I diastolic  dysfunction (impaired relaxation).   2. Right ventricular systolic function is normal. The right ventricular  size is normal.   3. The mitral valve is normal in structure. Trivial mitral valve  regurgitation. No evidence of mitral stenosis.   4. The aortic valve is tricuspid. Aortic valve regurgitation is not  visualized. No aortic stenosis is present.   5. The inferior vena cava is normal in size with greater than 50%  respiratory variability, suggesting right atrial pressure of 3 mmHg.   NM Stress Test 08/06/2020: Nuclear stress EF: 65%. There was no ST segment deviation noted during stress. Blood pressure demonstrated a normal response to exercise. The study is normal. This is a low risk study. The left ventricular ejection fraction is normal (55-65%).   Normal resting and stress perfusion. No ischemia or infarction EF 65%  CT chest 07/22/18 (cardiac findings only) FINDINGS: Cardiovascular: Heart size is normal. There is no significant pericardial fluid, thickening or pericardial calcification. There is aortic atherosclerosis, as well as atherosclerosis of the great vessels of the mediastinum and the coronary arteries, including calcified atherosclerotic plaque in the left main, left anterior descending and left circumflex coronary arteries.  EKG:  EKG is personally reviewed.  12/30/2022: not ordered today 07/28/2018: normal sinus rhythm  Recent Labs: 12/24/2022: ALT 11; BUN 12; Creatinine, Ser 0.58; Hemoglobin 14.2; Platelets 362.0; Potassium 4.9; Sodium 141; TSH 1.72  Recent Lipid Panel    Component Value Date/Time   CHOL 209 (H) 12/24/2022 1113   CHOL 191 09/13/2020 1059   TRIG 88.0 12/24/2022 1113   HDL 85.50 12/24/2022 1113   HDL 120 09/13/2020 1059   CHOLHDL 2 12/24/2022 1113   VLDL 17.6 12/24/2022 1113   LDLCALC 105 (H) 12/24/2022 1113   LDLCALC 99 03/01/2021 0750    Physical Exam:    VS:  BP (!) 100/58 (BP Location: Left Arm, Patient Position:  Sitting, Cuff Size: Normal)   Pulse 75   Ht 5' 2"$  (1.575 m)   Wt 114 lb 12.8 oz (52.1 kg)   SpO2 93%   BMI 21.00 kg/m     Wt Readings from Last 3 Encounters:  12/30/22 114 lb 12.8 oz (52.1 kg)  12/11/22 115 lb (52.2 kg)  11/27/22 115 lb (52.2 kg)    GEN: Well nourished, well developed in no acute distress HEENT: Normal, moist mucous membranes NECK: No JVD CARDIAC: regular rhythm, normal S1 and S2, no rubs or gallops. No murmur. VASCULAR: Radial and DP pulses 2+ bilaterally. No carotid bruits RESPIRATORY:  Clear to auscultation without rales, wheezing or rhonchi  ABDOMEN: Soft, non-tender, non-distended MUSCULOSKELETAL:  Ambulates independently SKIN: Warm and dry, no edema NEUROLOGIC:  Alert and oriented x 3. No focal neuro deficits noted. PSYCHIATRIC:  Normal affect    ASSESSMENT:    1. Pure hypercholesterolemia   2. Coronary artery calcification seen on CT scan  3. Cardiac risk counseling   4. Labile blood pressure   5. Family history of heart disease    PLAN:    Coronary artery calcification:  -no indication for additional testing given lack of symptoms -risk factor modification, as below  Hypercholesterolemia, possible familial hypercholesterolemia: Her family history is very strong, including one of her children having a Tchol >300.  -has had difficulty taking statin consistently in the evening. Ok to take in the AM. She will work to take the atorvastatin 20 mg more consistently and then recheck labs.  Labile blood pressure: well controlled today  CV risk counseling and Prevention: -recommend heart healthy/Mediterranean diet, with whole grains, fruits, vegetable, fish, lean meats, nuts, and olive oil. Limit salt. -recommend moderate walking, 3-5 times/week for 30-50 minutes each session. Aim for at least 150 minutes.week. Goal should be pace of 3 miles/hours, or walking 1.5 miles in 30 minutes -recommend avoidance of tobacco products. Avoid excess alcohol.  Plan  for follow up: labs in 2 months, follow up 1 year  Medication Adjustments/Labs and Tests Ordered: Current medicines are reviewed at length with the patient today.  Concerns regarding medicines are outlined above.  No orders of the defined types were placed in this encounter.  Meds ordered this encounter  Medications   atorvastatin (LIPITOR) 20 MG tablet    Sig: Take 1 tablet (20 mg total) by mouth daily.    Dispense:  90 tablet    Refill:  3    Patient Instructions  Medication Instructions:  The current medical regimen is effective;  continue present plan and medications.  *If you need a refill on your cardiac medications before your next appointment, please call your pharmacy*   Lab Work: Lipids and Liver function   If you have labs (blood work) drawn today and your tests are completely normal, you will receive your results only by: [place] (if you have MyChart) OR A paper copy in the mail If you have any lab test that is abnormal or we need to change your treatment, we will call you to review the results.   Testing/Procedures: None   Follow-Up: At [facility], you and your health needs are our priority.  As part of our continuing mission to provide you with exceptional heart care, we have created designated Provider Care Teams.  These Care Teams include your primary Cardiologist (physician) and Advanced Practice Providers (APPs -  Physician Assistants and Nurse Practitioners) who all work together to provide you with the care you need, when you need it.  We recommend signing up for the patient portal called "MyChart".  Sign up information is provided on this After Visit Summary.  MyChart is used to connect with patients for Virtual Visits (Telemedicine).  Patients are able to view lab/test results, encounter notes, upcoming appointments, etc.  Non-urgent messages can be sent to your provider as well.   To learn more about what you can do with MyChart, go to  [email or web address].    Your next appointment:   1 year(s)  Provider:   Buford Dresser, MD    Other Instructions None    Burnett Kanaris Ford,acting as a scribe for Buford Dresser, MD.,have documented all relevant documentation on the behalf of Buford Dresser, MD,as directed by  Buford Dresser, MD while in the presence of Buford Dresser, MD.   I, Buford Dresser, MD, have reviewed all documentation for this visit. The documentation on 12/31/22 for the exam, diagnosis, procedures, and orders are all accurate and complete.  Signed, Buford Dresser, MD PhD 12/31/2022 9:48 AM    [place]

## 2023-01-03 NOTE — Progress Notes (Unsigned)
Office Visit Note  Patient: Patricia Underwood             Date of Birth: 12/29/1950           MRN: [id number]             PCP: Vivi Barrack, MD Referring: Vivi Barrack, MD Visit Date: 01/05/2023   Subjective:  No chief complaint on file.   History of Present Illness: Patricia Underwood is a 72 y.o. female here for follow up ***   Previous HPI 11/24/22 Patricia Underwood is a 72 y.o. female here for rheumatoid arthritis and sicca syndrome. She has a history of chronic joint pain in multiple areas with known  osteoarthritis and with fibromyalgia syndrome. There has been some question of sjogren syndrome due to ongoing issues with dry mouth and accelerated dental decay but with negative serology. She saw Dr. Gerilyn Nestle last year for evaluation findings showing no objective peripheral joint synovitis and serum inflammatory markers and RA related antibodies were negative. She has been treated with intermittent prednisone which works well but the symptoms return when off treatment. More recently sustained a fall with associated wrist fracture and rupture of EPL tendon seeing hand surgery for this.  She does have somewhat generalized joint pains and morning stiffness lasting up to about 1 hour.  Typically does not see any obvious joint swelling or effusions.  She is not able to safely take oral nonsteroidal anti-inflammatory drugs. Dry mouth symptoms are pretty bothersome and she has experienced change in taste along with chronic irritation of the tongue.  She has been prescribed pilocarpine previously but did not take the medicine very consistently due to somewhat cost prohibitive and also had increased sweating side effects.  She does not have any particular history with inflammatory eye complications does not generally notice lymphadenopathy or any swelling in the area near major salivary glands.   No Rheumatology ROS completed.   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   Essential hypertension 08/14/2022   Chronic low back pain with sciatica 09/25/2021   Cervical radiculopathy 09/25/2021   Alopecia 09/25/2021   Short-term memory loss 07/23/2021   Rheumatoid arthritis ([place]) 03/28/2021   Migraine 03/28/2021   Knee pain 02/14/2021   Anxiety 02/14/2021   Esophageal spasm    Aortic atherosclerosis ([facility])    Coronary artery disease due to calcified coronary lesion 07/30/2020   IGT (impaired glucose tolerance) 03/20/2020   Chronic pain syndrome 07/04/2019   Dysphasia 04/21/2019   Sicca syndrome ([place]) 04/21/2019   Dental caries 04/21/2019   Osteopenia 10/25/2018   Situational mixed anxiety and depressive disorder 09/30/2018   Fibromyalgia 09/11/2018   Sensorineural hearing loss (SNHL) of both ears, followed by [facility], Arlie Solomons 07/20/2018   Opioid contract exists, [facility] 04/19/2018   Vitamin D deficiency 07/05/2017   B12 deficiency, Rx B12 injections 07/05/2017   Insomnia 07/05/2017   Chronic right shoulder pain 05/30/2017   HLD (hyperlipidemia) 05/30/2017    Past Medical History:  Diagnosis Date   Aortic atherosclerosis ([place])    B12 deficiency, Rx B12 injections 07/05/2017   Chronic back pain    Chronic right shoulder pain 05/30/2017   Coronary artery calcification seen on CAT scan    Esophageal spasm    Fibromyalgia    HLD (hyperlipidemia) 05/30/2017    Family History  Problem Relation Age of Onset   CAD Mother  died of massive MI at 45   CVA Father        x 2   Hyperlipidemia Sister    Diabetes Neg Hx    Past Surgical History:  Procedure Laterality Date   ABDOMINAL HYSTERECTOMY     KNEE SURGERY Right    LUMBAR FUSION     L4-L5   SHOULDER ARTHROSCOPY Right    Social History   Social History Narrative   Not on file   Immunization History  Administered Date(s) Administered   Influenza-Unspecified 11/10/2018   PFIZER Comirnaty(Gray Top)Covid-19 Tri-Sucrose Vaccine  12/13/2019, 01/03/2020   PFIZER(Purple Top)SARS-COV-2 Vaccination 12/13/2019, 01/03/2020, 08/07/2020, 05/30/2021, 09/06/2021   Tdap 05/31/2013, 03/30/2022     Objective: Vital Signs: There were no vitals taken for this visit.   Physical Exam   Musculoskeletal Exam: ***  CDAI Exam: CDAI Score: -- Patient Global: --; Provider Global: -- Swollen: --; Tender: -- Joint Exam 01/05/2023   No joint exam has been documented for this visit   There is currently no information documented on the homunculus. Go to the Rheumatology activity and complete the homunculus joint exam.  Investigation: No additional findings.  Imaging: No results found.  Recent Labs: Lab Results  Component Value Date   WBC 8.7 12/24/2022   HGB 14.2 12/24/2022   PLT 362.0 12/24/2022   NA 141 12/24/2022   K 4.9 12/24/2022   CL 104 12/24/2022   CO2 30 12/24/2022   GLUCOSE 89 12/24/2022   BUN 12 12/24/2022   CREATININE 0.58 12/24/2022   BILITOT 0.5 12/24/2022   ALKPHOS 87 12/24/2022   AST 15 12/24/2022   ALT 11 12/24/2022   PROT 6.3 12/24/2022   ALBUMIN 4.0 12/24/2022   CALCIUM 9.5 12/24/2022    Speciality Comments: No specialty comments available.  Procedures:  No procedures performed Allergies: Ambien [zolpidem tartrate], Amlodipine, Flexeril [cyclobenzaprine], Toradol [ketorolac tromethamine], and Naproxen   Assessment / Plan:     Visit Diagnoses: No diagnosis found.  ***  Orders: No orders of the defined types were placed in this encounter.  No orders of the defined types were placed in this encounter.    Follow-Up Instructions: No follow-ups on file.   Collier Salina, MD  Note - This record has been created using [employer].  Chart creation errors have been sought, but may not always  have been located. Such creation errors do not reflect on  the standard of medical care.

## 2023-01-05 ENCOUNTER — Encounter: Payer: Self-pay | Admitting: Internal Medicine

## 2023-01-05 ENCOUNTER — Ambulatory Visit: Payer: Medicare Other | Attending: Internal Medicine | Admitting: Internal Medicine

## 2023-01-05 VITALS — BP 139/82 | HR 74 | Resp 12 | Ht 62.0 in | Wt 115.0 lb

## 2023-01-05 DIAGNOSIS — M3500 Sicca syndrome, unspecified: Secondary | ICD-10-CM

## 2023-01-05 DIAGNOSIS — M19042 Primary osteoarthritis, left hand: Secondary | ICD-10-CM | POA: Insufficient documentation

## 2023-01-05 DIAGNOSIS — M0609 Rheumatoid arthritis without rheumatoid factor, multiple sites: Secondary | ICD-10-CM | POA: Diagnosis not present

## 2023-01-05 DIAGNOSIS — M19041 Primary osteoarthritis, right hand: Secondary | ICD-10-CM

## 2023-01-05 DIAGNOSIS — M797 Fibromyalgia: Secondary | ICD-10-CM | POA: Diagnosis not present

## 2023-01-05 MED ORDER — TRAMADOL HCL 50 MG PO TABS: 50.0000 mg | ORAL_TABLET | Freq: Four times a day (QID) | ORAL | 0 refills | Status: AC | PRN

## 2023-01-09 ENCOUNTER — Telehealth: Payer: Self-pay | Admitting: *Deleted

## 2023-01-09 DIAGNOSIS — M0609 Rheumatoid arthritis without rheumatoid factor, multiple sites: Secondary | ICD-10-CM

## 2023-01-09 MED ORDER — PREDNISONE 5 MG PO TABS: ORAL_TABLET | ORAL | 0 refills | Status: AC

## 2023-01-09 NOTE — Telephone Encounter (Signed)
Will send new Rx for prednisone taper for 12 days. Starting at 20 mg daily which has been the dose for her in the past.

## 2023-01-09 NOTE — Addendum Note (Signed)
Addended by: Collier Salina on: 01/09/2023 04:25 PM   Modules accepted: Orders

## 2023-01-09 NOTE — Telephone Encounter (Signed)
I called patient 

## 2023-01-09 NOTE — Telephone Encounter (Signed)
Patient wants RX for prednisone to [facility] on [address]. Patient states Tramadol is not helping. Please advise.

## 2023-01-14 ENCOUNTER — Encounter: Payer: Self-pay | Admitting: Family Medicine

## 2023-01-14 ENCOUNTER — Ambulatory Visit (INDEPENDENT_AMBULATORY_CARE_PROVIDER_SITE_OTHER): Payer: Medicare Other | Admitting: Family Medicine

## 2023-01-14 VITALS — BP 159/71 | HR 92 | Temp 98.0°F | Ht 62.0 in | Wt 109.8 lb

## 2023-01-14 DIAGNOSIS — M0609 Rheumatoid arthritis without rheumatoid factor, multiple sites: Secondary | ICD-10-CM | POA: Diagnosis not present

## 2023-01-14 DIAGNOSIS — I10 Essential (primary) hypertension: Secondary | ICD-10-CM

## 2023-01-14 MED ORDER — OXYCODONE-ACETAMINOPHEN 5-325 MG PO TABS: 1.0000 | ORAL_TABLET | Freq: Three times a day (TID) | ORAL | 0 refills | Status: AC | PRN

## 2023-01-14 NOTE — Assessment & Plan Note (Signed)
Mildly elevated today in setting of acute pain.  She has typically been well-controlled.  She was at goal at most recent office visit with cardiology.  They will continue to monitor at home and let us know if persistently elevated.  May need to restart amlodipine if persistently elevated.

## 2023-01-14 NOTE — Patient Instructions (Addendum)
It was very nice to see you today!  We will send in pain medication.  Please follow-up with your rheumatologist and orthopedist soon.  Take care, Dr Jerline Pain  PLEASE NOTE:  If you had any lab tests, please let us know if you have not heard back within a few days. You may see your results on mychart before we have a chance to review them but we will give you a call once they are reviewed by [place].   If we ordered any referrals today, please let us know if you have not heard from their office within the next week.   If you had any urgent prescriptions sent in today, please check with the pharmacy within an hour of our visit to make sure the prescription was transmitted appropriately.   Please try these tips to maintain a healthy lifestyle:  Eat at least 3 REAL meals and 1-2 snacks per day.  Aim for no more than 5 hours between eating.  If you eat breakfast, please do so within one hour of getting up.   Each meal should contain half fruits/vegetables, one quarter protein, and one quarter carbs (no bigger than a computer mouse)  Cut down on sweet beverages. This includes juice, soda, and sweet tea.   Drink at least 1 glass of water with each meal and aim for at least 8 glasses per day  Exercise at least 150 minutes every week.

## 2023-01-14 NOTE — Progress Notes (Addendum)
Patricia Underwood is a 72 y.o. female who presents today for an office visit.  Assessment/Plan:  Chronic Problems Addressed Today: Rheumatoid arthritis ([place]) Had lengthy discussion with patient today and her husband regarding her management for rheumatoid arthritis and pain management.  She does not wish to be on prednisone for this any longer due side effects.  She did not feel like tramadol was providing adequate relief.  She recently was given prescription for Percocet for postoperative pain related to her hand surgery a few weeks ago and requests refill for this to help with her generalized pain as well.  Discussed with patient that we could give a short supply for Percocet however that we would not be able to do this for chronic pain management or on a recurrent basis.  They do have follow-up with orthopedics scheduled in a couple of weeks.  We will give a couple weeks supply of Percocet today.  She has previously done well with this without any significant side effects.  Side effects were discussed today.  Database was reviewed without red flags.  Again reiterated that this would not be able to be done on a chronic or recurrent basis.  Patient and her husband voiced understanding and agreed to this.  We discussed having her follow-up with the pain clinic for ongoing pain management however they declined at this time. Advised her to follow up with rheumatology soon.   Essential hypertension Mildly elevated today in setting of acute pain.  She has typically been well-controlled.  She was at goal at most recent office visit with cardiology.  They will continue to monitor at home and let us know if persistently elevated.  May need to restart amlodipine if persistently elevated.     Subjective:  HPI:  See A/p for status of chronic conditions.  Patient is here with her husband today.  Primary concern today is pain from rheumatoid arthritis.  We last saw her a few months ago.  Since her last visit she  unfortunately broke her left wrist.  She has been following with orthopedics for this.  She additionally had tendon rupture that had to be repaired.  Surgery was about 4 weeks ago.  She is doing relatively well postoperatively regarding her wrist however still has ongoing and diffuse arthritis pain.  We referred her to see rheumatology and she has seen them a couple of times over the last couple of months.  Most recently saw dermatology last week for worsening rheumatoid arthritis pain.  They had discussed treatment options at that visit and initially had elected to avoid prednisone due to concern with complications.  She was given a short-term prescription for tramadol.  Unfortunately her pain did not respond much to the tramadol and she contacted the rheumatologist office again and she was started on a short course of prednisone.  She has had some issues with prednisone affecting her vision and general mood and does not wish to be on this any longer.  Her pain is currently significantly impacting her functional ability and ability to perform ADLs.  She has been on oxycodone in the past for flareups of pain and would like to restart a short course of this.  She has done well this previously without any significant side effects.        Objective:  Physical Exam: BP (!) 159/71   Pulse 92   Temp 98 F (36.7 C) (Temporal)   Ht '5\' 2"'$  (1.575 m)   Wt 109 lb 12.8 oz (  49.8 kg)   SpO2 98%   BMI 20.08 kg/m   Gen: No acute distress, resting comfortably CV: Regular rate and rhythm with no murmurs appreciated Pulm: Normal work of breathing, clear to auscultation bilaterally with no crackles, wheezes, or rhonchi Neuro: Grossly normal, moves all extremities Psych: Normal affect and thought content       M. Jerline Pain, MD 01/14/2023 10:51 AM

## 2023-01-14 NOTE — Assessment & Plan Note (Addendum)
Had lengthy discussion with patient today and her husband regarding her management for rheumatoid arthritis and pain management.  She does not wish to be on prednisone for this any longer due side effects.  She did not feel like tramadol was providing adequate relief.  She recently was given prescription for Percocet for postoperative pain related to her hand surgery a few weeks ago and requests refill for this to help with her generalized pain as well.  Discussed with patient that we could give a short supply for Percocet however that we would not be able to do this for chronic pain management or on a recurrent basis.  They do have follow-up with orthopedics scheduled in a couple of weeks.  We will give a couple weeks supply of Percocet today.  She has previously done well with this without any significant side effects.  Side effects were discussed today.  Database was reviewed without red flags.  Again reiterated that this would not be able to be done on a chronic or recurrent basis.  Patient and her husband voiced understanding and agreed to this.  We discussed having her follow-up with the pain clinic for ongoing pain management however they declined at this time. Advised her to follow up with rheumatology soon.

## 2023-01-18 LAB — ALDOSTERONE + RENIN ACTIVITY W/ RATIO
ALDO / PRA Ratio: 1.1 Ratio (ref 0.9–28.9)
Aldosterone: 1 ng/dL
Renin Activity: 0.94 ng/mL/h (ref 0.25–5.82)

## 2023-01-18 LAB — CATECHOLAMINES, FRACTIONATED, PLASMA
Dopamine: <10 pg/mL
Epinephrine: 28 pg/mL
Norepinephrine: 381 pg/mL
Total Catecholamines: <419 pg/mL

## 2023-01-18 LAB — VITAMIN D 1,25 DIHYDROXY

## 2023-01-18 LAB — METANEPHRINES, PLASMA
Metanephrine, Free: 43 pg/mL (ref <=57)
Normetanephrine, Free: 48 pg/mL (ref <=148)
Total Metanephrines-Plasma: 91 pg/mL (ref <=205)

## 2023-01-27 ENCOUNTER — Encounter: Payer: Self-pay | Admitting: Family Medicine

## 2023-01-27 ENCOUNTER — Ambulatory Visit (INDEPENDENT_AMBULATORY_CARE_PROVIDER_SITE_OTHER): Payer: Medicare Other | Admitting: Family Medicine

## 2023-01-27 VITALS — BP 138/82 | HR 97 | Temp 97.3°F | Ht 62.0 in | Wt 109.8 lb

## 2023-01-27 DIAGNOSIS — G894 Chronic pain syndrome: Secondary | ICD-10-CM

## 2023-01-27 DIAGNOSIS — I10 Essential (primary) hypertension: Secondary | ICD-10-CM

## 2023-01-27 DIAGNOSIS — M0609 Rheumatoid arthritis without rheumatoid factor, multiple sites: Secondary | ICD-10-CM

## 2023-01-27 DIAGNOSIS — J029 Acute pharyngitis, unspecified: Secondary | ICD-10-CM

## 2023-01-27 LAB — POC COVID19 BINAXNOW: SARS Coronavirus 2 Ag: NEGATIVE

## 2023-01-27 LAB — POCT RAPID STREP A (OFFICE): Rapid Strep A Screen: POSITIVE — AB

## 2023-01-27 MED ORDER — ONDANSETRON 4 MG PO TBDP: 4.0000 mg | ORAL_TABLET | Freq: Three times a day (TID) | ORAL | 0 refills | Status: DC | PRN

## 2023-01-27 MED ORDER — AMOXICILLIN 500 MG PO CAPS: 500.0000 mg | ORAL_CAPSULE | Freq: Two times a day (BID) | ORAL | 0 refills | Status: AC

## 2023-01-27 NOTE — Assessment & Plan Note (Signed)
Patient last seen a couple of weeks ago for this.  At our last visit we refilled Percocet that she had received for postoperative pain for hand surgery.  This seems to be working well for her pain.  She has noticed any significant side effects from this.  She does feel like her rheumatoid arthritis pain has worsened over the last week or so since being sick.  She has follow-up with orthopedics next week.  Discussed with patient that I would not be able to give narcotics on a chronic or recurrent basis.

## 2023-01-27 NOTE — Assessment & Plan Note (Addendum)
Initially elevated but at goal on recheck.  She will continue to monitor at home and let us know if persistently elevated.

## 2023-01-27 NOTE — Patient Instructions (Signed)
It was very nice to see you today!  You have strep pharyngitis.  Please start the amoxicillin.  Use the Zofran as needed.  Make sure that you are getting plenty of fluids.  Let us know if not improving.  Take care, Dr Jerline Pain  PLEASE NOTE:  If you had any lab tests, please let us know if you have not heard back within a few days. You may see your results on mychart before we have a chance to review them but we will give you a call once they are reviewed by [place].   If we ordered any referrals today, please let us know if you have not heard from their office within the next week.   If you had any urgent prescriptions sent in today, please check with the pharmacy within an hour of our visit to make sure the prescription was transmitted appropriately.   Please try these tips to maintain a healthy lifestyle:  Eat at least 3 REAL meals and 1-2 snacks per day.  Aim for no more than 5 hours between eating.  If you eat breakfast, please do so within one hour of getting up.   Each meal should contain half fruits/vegetables, one quarter protein, and one quarter carbs (no bigger than a computer mouse)  Cut down on sweet beverages. This includes juice, soda, and sweet tea.   Drink at least 1 glass of water with each meal and aim for at least 8 glasses per day  Exercise at least 150 minutes every week.

## 2023-01-27 NOTE — Progress Notes (Signed)
   Patricia Underwood is a 72 y.o. female who presents today for an office visit.  Assessment/Plan:  New/Acute Problems: Strep Pharyngitis  Rapid strep positive.  Will start amoxicillin.  She is having some nausea as well.  Will start Zofran for this.  Encouraged hydration.  She can use over-the-counter meds as needed.  We discussed reasons to return to care.  Follow-up as needed.  Chronic Problems Addressed Today: Rheumatoid arthritis ([place]) Patient last seen a couple of weeks ago for this.  At our last visit we refilled Percocet that she had received for postoperative pain for hand surgery.  This seems to be working well for her pain.  She has noticed any significant side effects from this.  She does feel like her rheumatoid arthritis pain has worsened over the last week or so since being sick.  She has follow-up with orthopedics next week.  Discussed with patient that I would not be able to give narcotics on a chronic or recurrent basis.  Essential hypertension Initially elevated but at goal on recheck.  She will continue to monitor at home and let us know if persistently elevated.     Subjective:  HPI:  See A/P for status of chronic conditions.  Patient is here with sore throat congestion, nausea, and diarrhea.  Started about a week ago. She has not had much appetite. Feels like her esophagus is on fire. Some decreased appetite. No treatments tried. She has vomited a few times.        Objective:  Physical Exam: BP 138/82   Pulse 97   Temp (!) 97.3 F (36.3 C) (Temporal)   Ht 5\' 2"  (1.575 m)   Wt 109 lb 12.8 oz (49.8 kg)   SpO2 97%   BMI 20.08 kg/m   Gen: No acute distress, resting comfortably HEENT: OP erythematous.  No exudates.  TMs with clear effusion. CV: Regular rate and rhythm with no murmurs appreciated Pulm: Normal work of breathing, clear to auscultation bilaterally with no crackles, wheezes, or rhonchi Neuro: Grossly normal, moves all extremities Psych: Normal affect  and thought content       M. Jerline Pain, MD 01/27/2023 12:21 PM

## 2023-01-30 ENCOUNTER — Telehealth: Payer: Self-pay | Admitting: Family Medicine

## 2023-01-30 MED ORDER — OXYCODONE-ACETAMINOPHEN 5-325 MG PO TABS: 1.0000 | ORAL_TABLET | Freq: Three times a day (TID) | ORAL | 0 refills | Status: AC | PRN

## 2023-01-30 NOTE — Telephone Encounter (Signed)
Patient requests Partial RX (approx. 1 week supply) of oxyCODONE-acetaminophen  be sent to [id number] - [place], [place] Phone: [phone]  Fax: [phone]

## 2023-02-10 ENCOUNTER — Ambulatory Visit (INDEPENDENT_AMBULATORY_CARE_PROVIDER_SITE_OTHER): Payer: Medicare Other | Admitting: Family Medicine

## 2023-02-10 ENCOUNTER — Encounter: Payer: Self-pay | Admitting: Family Medicine

## 2023-02-10 VITALS — BP 138/86 | HR 102 | Temp 97.7°F | Ht 62.0 in | Wt 108.6 lb

## 2023-02-10 DIAGNOSIS — R5381 Other malaise: Secondary | ICD-10-CM | POA: Diagnosis not present

## 2023-02-10 DIAGNOSIS — E538 Deficiency of other specified B group vitamins: Secondary | ICD-10-CM

## 2023-02-10 DIAGNOSIS — G894 Chronic pain syndrome: Secondary | ICD-10-CM | POA: Diagnosis not present

## 2023-02-10 DIAGNOSIS — E559 Vitamin D deficiency, unspecified: Secondary | ICD-10-CM

## 2023-02-10 DIAGNOSIS — J029 Acute pharyngitis, unspecified: Secondary | ICD-10-CM

## 2023-02-10 DIAGNOSIS — R5383 Other fatigue: Secondary | ICD-10-CM

## 2023-02-10 DIAGNOSIS — R739 Hyperglycemia, unspecified: Secondary | ICD-10-CM | POA: Diagnosis not present

## 2023-02-10 DIAGNOSIS — I10 Essential (primary) hypertension: Secondary | ICD-10-CM

## 2023-02-10 LAB — POCT RAPID STREP A (OFFICE): Rapid Strep A Screen: NEGATIVE

## 2023-02-10 NOTE — Progress Notes (Signed)
   Patricia Underwood is a 72 y.o. female who presents today for an office visit.  Assessment/Plan:  New/Acute Problems: Sore Throat / Malaise Her rapid strep today is negative and she has already completed treatment for this a couple of weeks ago with amoxicillin.  Given that symptoms have been going on for about a month at this point would be reasonable to check for other possible causes.  Will check labs today including CBC.  If labs are negative and symptoms persist will need referral to ENT.  We discussed reasons to return to care.  Chronic Problems Addressed Today: Vitamin D deficiency Check vitamin D.   B12 deficiency, Rx B12 injections Check B12.   Chronic pain syndrome Has longstanding chronic pain secondary to rheumatoid arthritis and osteoarthritis.  She has previously seen pain clinic in the past however has not seen them for quite a while as she was hoping to avoid chronic narcotics.  Her pain has progressed to the point where she is now willing to go back.  She has been following with orthopedics and they have been managing her orthopedic pain however pain is still been persistent.  Will place referral for her to go back to pain clinic.  Essential hypertension Initially elevated today however at goal on recheck.  Patient is concerned about blood pressure readings.  Discussed with patient it is common for her blood pressure to fluctuate.  They will monitor at home for the next week or so.  If blood pressures persistently elevated will need to restart antihypertensives.  We have previously prescribed her Norvasc however she has discontinued this due to side effects including peripheral edema.     Subjective:  HPI:  See A/P for status of chronic conditions.  Patient is here today for follow-up.  We last saw her a couple weeks ago with sore throat.  Rapid strep was positive.  We started her on amoxicillin.  She is not sure if the amoxicillin helped.   She still has headache, diarrhea,  bilateral ear pain, throat pain, sinus pressure and drainage. Symptoms are stable. No other treatments tried. No other sick contacts. She feels very tired an fatigued. She has a lot of malaise. No cough. No shortness of breath. Feels like she has burning in her esophagus and stomach. This discomfort is constant. She is a lot of phlegm production. No heat burn or reflux.        Objective:  Physical Exam: BP 138/86   Pulse (!) 102   Temp 97.7 F (36.5 C) (Temporal)   Ht 5\' 2"  (1.575 m)   Wt 108 lb 9.6 oz (49.3 kg)   SpO2 98%   BMI 19.86 kg/m   Gen: No acute distress, resting comfortably HEENT: TMs are clear effusion.  OP erythematous.  No exudates. CV: Regular rate and rhythm with no murmurs appreciated Pulm: Normal work of breathing, clear to auscultation bilaterally with no crackles, wheezes, or rhonchi Neuro: Grossly normal, moves all extremities Psych: Normal affect and thought content       M. Jerline Pain, MD 02/10/2023 3:21 PM

## 2023-02-10 NOTE — Assessment & Plan Note (Signed)
Check B12 

## 2023-02-10 NOTE — Assessment & Plan Note (Signed)
Check vitamin D. 

## 2023-02-10 NOTE — Assessment & Plan Note (Signed)
Has longstanding chronic pain secondary to rheumatoid arthritis and osteoarthritis.  She has previously seen pain clinic in the past however has not seen them for quite a while as she was hoping to avoid chronic narcotics.  Her pain has progressed to the point where she is now willing to go back.  She has been following with orthopedics and they have been managing her orthopedic pain however pain is still been persistent.  Will place referral for her to go back to pain clinic.

## 2023-02-10 NOTE — Patient Instructions (Signed)
It was very nice to see you today!  Your strep test today is negative.  Will check blood work.  We may need to refer you to see an ENT doctor depending on results.  Please keep an eye on your blood pressure.  Monitor daily and send me a message in a week or so to let me know how it is reading at home.  We will refer you to see the pain clinic.  Take care, Dr Jerline Pain  PLEASE NOTE:  If you had any lab tests, please let us know if you have not heard back within a few days. You may see your results on mychart before we have a chance to review them but we will give you a call once they are reviewed by [place].   If we ordered any referrals today, please let us know if you have not heard from their office within the next week.   If you had any urgent prescriptions sent in today, please check with the pharmacy within an hour of our visit to make sure the prescription was transmitted appropriately.   Please try these tips to maintain a healthy lifestyle:  Eat at least 3 REAL meals and 1-2 snacks per day.  Aim for no more than 5 hours between eating.  If you eat breakfast, please do so within one hour of getting up.   Each meal should contain half fruits/vegetables, one quarter protein, and one quarter carbs (no bigger than a computer mouse)  Cut down on sweet beverages. This includes juice, soda, and sweet tea.   Drink at least 1 glass of water with each meal and aim for at least 8 glasses per day  Exercise at least 150 minutes every week.

## 2023-02-10 NOTE — Assessment & Plan Note (Signed)
Initially elevated today however at goal on recheck.  Patient is concerned about blood pressure readings.  Discussed with patient it is common for her blood pressure to fluctuate.  They will monitor at home for the next week or so.  If blood pressures persistently elevated will need to restart antihypertensives.  We have previously prescribed her Norvasc however she has discontinued this due to side effects including peripheral edema.

## 2023-02-11 ENCOUNTER — Other Ambulatory Visit (INDEPENDENT_AMBULATORY_CARE_PROVIDER_SITE_OTHER): Payer: Medicare Other

## 2023-02-11 DIAGNOSIS — R5381 Other malaise: Secondary | ICD-10-CM | POA: Diagnosis not present

## 2023-02-11 DIAGNOSIS — R739 Hyperglycemia, unspecified: Secondary | ICD-10-CM | POA: Diagnosis not present

## 2023-02-11 DIAGNOSIS — E559 Vitamin D deficiency, unspecified: Secondary | ICD-10-CM

## 2023-02-11 DIAGNOSIS — R5383 Other fatigue: Secondary | ICD-10-CM

## 2023-02-11 DIAGNOSIS — J029 Acute pharyngitis, unspecified: Secondary | ICD-10-CM | POA: Diagnosis not present

## 2023-02-11 LAB — CBC
HCT: 40.9 % (ref 36.0–46.0)
Hemoglobin: 13.9 g/dL (ref 12.0–15.0)
MCHC: 34.1 g/dL (ref 30.0–36.0)
MCV: 91.6 fl (ref 78.0–100.0)
Platelets: 461 10*3/uL — ABNORMAL HIGH (ref 150.0–400.0)
RBC: 4.47 Mil/uL (ref 3.87–5.11)
RDW: 13.1 % (ref 11.5–15.5)
WBC: 8.3 10*3/uL (ref 4.0–10.5)

## 2023-02-11 LAB — COMPREHENSIVE METABOLIC PANEL
ALT: 14 U/L (ref 0–35)
AST: 15 U/L (ref 0–37)
Albumin: 4.4 g/dL (ref 3.5–5.2)
Alkaline Phosphatase: 76 U/L (ref 39–117)
BUN: 18 mg/dL (ref 6–23)
CO2: 28 mEq/L (ref 19–32)
Calcium: 9.8 mg/dL (ref 8.4–10.5)
Chloride: 106 mEq/L (ref 96–112)
Creatinine, Ser: 0.54 mg/dL (ref 0.40–1.20)
GFR: 92.18 mL/min (ref >60.00)
Glucose, Bld: 71 mg/dL (ref 70–99)
Potassium: 4.8 mEq/L (ref 3.5–5.1)
Sodium: 141 mEq/L (ref 135–145)
Total Bilirubin: 0.6 mg/dL (ref 0.2–1.2)
Total Protein: 6.5 g/dL (ref 6.0–8.3)

## 2023-02-11 LAB — TSH: TSH: 1.22 u[IU]/mL (ref 0.35–5.50)

## 2023-02-11 LAB — HEMOGLOBIN A1C: Hgb A1c MFr Bld: 5.8 % (ref 4.6–6.5)

## 2023-02-11 LAB — VITAMIN D 25 HYDROXY (VIT D DEFICIENCY, FRACTURES): VITD: 41.89 ng/mL (ref 30.00–100.00)

## 2023-02-11 LAB — VITAMIN B12: Vitamin B-12: 195 pg/mL — ABNORMAL LOW (ref 211–911)

## 2023-02-11 NOTE — Progress Notes (Unsigned)
kk

## 2023-02-11 NOTE — Addendum Note (Signed)
Addended by: Vivi Barrack on: 02/11/2023 11:05 AM   Modules accepted: Orders

## 2023-02-12 ENCOUNTER — Encounter: Payer: Self-pay | Admitting: Gastroenterology

## 2023-02-12 ENCOUNTER — Encounter: Payer: Self-pay | Admitting: Family Medicine

## 2023-02-12 NOTE — Telephone Encounter (Signed)
Patient requests lab order to have her iron level checked. Patient requests to be called to be advised re: earlier message.

## 2023-02-12 NOTE — Telephone Encounter (Signed)
I am still waiting on all of her tests to come back. We will contact her once we have everything back.  It is fine for [place] to add on an iron panel.  Algis Greenhouse. Jerline Pain, MD 02/12/2023 3:25 PM

## 2023-02-13 ENCOUNTER — Other Ambulatory Visit (INDEPENDENT_AMBULATORY_CARE_PROVIDER_SITE_OTHER): Payer: Medicare Other

## 2023-02-13 ENCOUNTER — Other Ambulatory Visit: Payer: Self-pay

## 2023-02-13 DIAGNOSIS — Z8639 Personal history of other endocrine, nutritional and metabolic disease: Secondary | ICD-10-CM | POA: Diagnosis not present

## 2023-02-13 DIAGNOSIS — R5381 Other malaise: Secondary | ICD-10-CM

## 2023-02-13 DIAGNOSIS — G894 Chronic pain syndrome: Secondary | ICD-10-CM

## 2023-02-13 DIAGNOSIS — R5383 Other fatigue: Secondary | ICD-10-CM

## 2023-02-13 DIAGNOSIS — R7989 Other specified abnormal findings of blood chemistry: Secondary | ICD-10-CM

## 2023-02-13 LAB — IBC + FERRITIN
Ferritin: 124.1 ng/mL (ref 10.0–291.0)
Iron: 166 ug/dL — ABNORMAL HIGH (ref 42–145)
Saturation Ratios: 45.4 % (ref 20.0–50.0)
TIBC: 365.4 ug/dL (ref 250.0–450.0)
Transferrin: 261 mg/dL (ref 212.0–360.0)

## 2023-02-13 NOTE — Progress Notes (Signed)
Please inform patient of the following:  We are still waiting on her mono test to come back however her B12 is low.  This could explain some of her symptoms.  Recommend we start her on B12 replacement protocol here.  Her blood sugar is very slightly borderline elevated but this should not cause any issues.  We can recheck this in a year or so.  Platelets are mildly elevated.  Recommend she come back in a few weeks to recheck.  Please place future order for CBC.  The rest of her labs are all normal.   M. Jimmey Ralph, MD 02/13/2023 12:53 PM

## 2023-02-14 LAB — MONONUCLEOSIS SCREEN: Heterophile, Mono Screen: NEGATIVE

## 2023-02-16 ENCOUNTER — Encounter: Payer: Self-pay | Admitting: Family Medicine

## 2023-02-16 NOTE — Progress Notes (Signed)
Please inform patient of the following:  Mono test is negative.  Katina Degree. Jimmey Ralph, MD 02/16/2023 12:36 PM

## 2023-02-16 NOTE — Telephone Encounter (Signed)
Please advise 

## 2023-02-17 ENCOUNTER — Other Ambulatory Visit: Payer: Self-pay | Admitting: *Deleted

## 2023-02-17 DIAGNOSIS — R202 Paresthesia of skin: Secondary | ICD-10-CM | POA: Diagnosis not present

## 2023-02-17 DIAGNOSIS — M4316 Spondylolisthesis, lumbar region: Secondary | ICD-10-CM | POA: Diagnosis not present

## 2023-02-17 DIAGNOSIS — Z1211 Encounter for screening for malignant neoplasm of colon: Secondary | ICD-10-CM

## 2023-02-17 DIAGNOSIS — R200 Anesthesia of skin: Secondary | ICD-10-CM | POA: Diagnosis not present

## 2023-02-17 DIAGNOSIS — M545 Low back pain, unspecified: Secondary | ICD-10-CM | POA: Diagnosis not present

## 2023-02-17 NOTE — Telephone Encounter (Signed)
Elevated platelets is nonspecific and can be a normal result.  I do not think this is related to anything that she has going on.  She can come back this week or next week to recheck to make sure that her numbers are normal.  Ok to refer to gastroenterology outside of our system if needed.   Ok to order cologuard.   Katina Degree. Jimmey Ralph, MD 02/17/2023 12:59 PM

## 2023-02-18 ENCOUNTER — Encounter: Payer: Self-pay | Admitting: Family Medicine

## 2023-02-18 NOTE — Telephone Encounter (Signed)
See note

## 2023-02-18 NOTE — Telephone Encounter (Signed)
She had a slight elevation in her iron count. This can be elevated for many reasons including stress or infection. We can recheck it again in a few weeks but it is not currently a cause for concern.  Katina Degree. Jimmey Ralph, MD 02/18/2023 9:28 AM

## 2023-02-19 DIAGNOSIS — G8929 Other chronic pain: Secondary | ICD-10-CM | POA: Diagnosis not present

## 2023-02-19 DIAGNOSIS — M545 Low back pain, unspecified: Secondary | ICD-10-CM | POA: Diagnosis not present

## 2023-02-19 DIAGNOSIS — Z79899 Other long term (current) drug therapy: Secondary | ICD-10-CM | POA: Diagnosis not present

## 2023-02-19 NOTE — Telephone Encounter (Signed)
Message sent to Dr Denyse Amass through patient's Patricia Underwood) chart.

## 2023-02-24 DIAGNOSIS — Z79899 Other long term (current) drug therapy: Secondary | ICD-10-CM | POA: Diagnosis not present

## 2023-02-24 DIAGNOSIS — G8929 Other chronic pain: Secondary | ICD-10-CM | POA: Diagnosis not present

## 2023-02-24 DIAGNOSIS — M545 Low back pain, unspecified: Secondary | ICD-10-CM | POA: Diagnosis not present

## 2023-02-24 DIAGNOSIS — Z6820 Body mass index (BMI) 20.0-20.9, adult: Secondary | ICD-10-CM | POA: Diagnosis not present

## 2023-02-24 DIAGNOSIS — R030 Elevated blood-pressure reading, without diagnosis of hypertension: Secondary | ICD-10-CM | POA: Diagnosis not present

## 2023-02-26 DIAGNOSIS — M4316 Spondylolisthesis, lumbar region: Secondary | ICD-10-CM | POA: Diagnosis not present

## 2023-02-26 DIAGNOSIS — G540 Brachial plexus disorders: Secondary | ICD-10-CM | POA: Diagnosis not present

## 2023-02-27 ENCOUNTER — Other Ambulatory Visit: Payer: Self-pay | Admitting: Neurological Surgery

## 2023-02-27 DIAGNOSIS — G540 Brachial plexus disorders: Secondary | ICD-10-CM

## 2023-03-03 ENCOUNTER — Other Ambulatory Visit: Payer: Self-pay | Admitting: *Deleted

## 2023-03-03 ENCOUNTER — Other Ambulatory Visit: Payer: Self-pay | Admitting: Family Medicine

## 2023-03-03 MED ORDER — TRIAZOLAM 0.25 MG PO TABS: ORAL_TABLET | ORAL | 0 refills | Status: DC

## 2023-03-03 NOTE — Telephone Encounter (Signed)
Patient requests to have her iron level checked (no orders in system) so that she can get all labs done at the same time.

## 2023-03-04 ENCOUNTER — Other Ambulatory Visit: Payer: Self-pay | Admitting: *Deleted

## 2023-03-04 DIAGNOSIS — R5383 Other fatigue: Secondary | ICD-10-CM

## 2023-03-04 NOTE — Telephone Encounter (Signed)
Ok to place order for iron panel.  Patricia Underwood. Jimmey Ralph, MD 03/04/2023 8:14 AM

## 2023-03-17 ENCOUNTER — Other Ambulatory Visit (INDEPENDENT_AMBULATORY_CARE_PROVIDER_SITE_OTHER): Payer: Medicare Other

## 2023-03-17 DIAGNOSIS — D75839 Thrombocytosis, unspecified: Secondary | ICD-10-CM | POA: Diagnosis not present

## 2023-03-17 DIAGNOSIS — R5383 Other fatigue: Secondary | ICD-10-CM

## 2023-03-17 DIAGNOSIS — R7989 Other specified abnormal findings of blood chemistry: Secondary | ICD-10-CM

## 2023-03-17 LAB — CBC WITH DIFFERENTIAL/PLATELET
Basophils Absolute: 0.1 10*3/uL (ref 0.0–0.1)
Basophils Relative: 0.7 % (ref 0.0–3.0)
Eosinophils Absolute: 0.1 10*3/uL (ref 0.0–0.7)
Eosinophils Relative: 1.3 % (ref 0.0–5.0)
HCT: 39.3 % (ref 36.0–46.0)
Hemoglobin: 13.6 g/dL (ref 12.0–15.0)
Lymphocytes Relative: 30.1 % (ref 12.0–46.0)
Lymphs Abs: 2.6 10*3/uL (ref 0.7–4.0)
MCHC: 34.5 g/dL (ref 30.0–36.0)
MCV: 91.3 fl (ref 78.0–100.0)
Monocytes Absolute: 0.6 10*3/uL (ref 0.1–1.0)
Monocytes Relative: 6.7 % (ref 3.0–12.0)
Neutro Abs: 5.3 10*3/uL (ref 1.4–7.7)
Neutrophils Relative %: 61.2 % (ref 43.0–77.0)
Platelets: 465 10*3/uL — ABNORMAL HIGH (ref 150.0–400.0)
RBC: 4.31 Mil/uL (ref 3.87–5.11)
RDW: 12.9 % (ref 11.5–15.5)
WBC: 8.6 10*3/uL (ref 4.0–10.5)

## 2023-03-18 ENCOUNTER — Other Ambulatory Visit: Payer: Self-pay

## 2023-03-18 DIAGNOSIS — R7989 Other specified abnormal findings of blood chemistry: Secondary | ICD-10-CM

## 2023-03-18 LAB — IRON,TIBC AND FERRITIN PANEL
%SAT: 36 % (calc) (ref 16–45)
Ferritin: 98 ng/mL (ref 16–288)
Iron: 125 ug/dL (ref 45–160)
TIBC: 348 mcg/dL (calc) (ref 250–450)

## 2023-03-18 NOTE — Progress Notes (Signed)
Iron is back to normal.  Platelets are still elevated. We need to do more testing to figure out what is causing this.   Can we call the lab to see if they can add on a peripheral smear? If not we may need to have her come back to have this redrawn.  Patricia Underwood. Jimmey Ralph, MD 03/18/2023 8:35 AM

## 2023-03-19 ENCOUNTER — Other Ambulatory Visit: Payer: Medicare Other

## 2023-03-19 DIAGNOSIS — R7989 Other specified abnormal findings of blood chemistry: Secondary | ICD-10-CM

## 2023-03-19 NOTE — Progress Notes (Signed)
It is very unlikely her results would be due to an undiagnosed cancer.   Patricia Underwood. Jimmey Ralph, MD 03/19/2023 12:54 PM

## 2023-03-20 DIAGNOSIS — Z79899 Other long term (current) drug therapy: Secondary | ICD-10-CM | POA: Diagnosis not present

## 2023-03-20 DIAGNOSIS — R030 Elevated blood-pressure reading, without diagnosis of hypertension: Secondary | ICD-10-CM | POA: Diagnosis not present

## 2023-03-20 DIAGNOSIS — M545 Low back pain, unspecified: Secondary | ICD-10-CM | POA: Diagnosis not present

## 2023-03-20 DIAGNOSIS — G8929 Other chronic pain: Secondary | ICD-10-CM | POA: Diagnosis not present

## 2023-03-20 DIAGNOSIS — Z6820 Body mass index (BMI) 20.0-20.9, adult: Secondary | ICD-10-CM | POA: Diagnosis not present

## 2023-03-20 LAB — PATHOLOGIST SMEAR REVIEW

## 2023-03-23 ENCOUNTER — Other Ambulatory Visit: Payer: Self-pay

## 2023-03-23 DIAGNOSIS — R7989 Other specified abnormal findings of blood chemistry: Secondary | ICD-10-CM

## 2023-03-23 DIAGNOSIS — R5383 Other fatigue: Secondary | ICD-10-CM

## 2023-03-23 NOTE — Progress Notes (Signed)
The neurologist reviewed her blood under microscope.  She does have elevated platelets that are most likely due to reaction due to inflammation. This is probably from all of her recent orthopedic issues, however we can refer her to hematology for further evaluation to make sure nothing else is going on if she is agreeable.  Please place referral.

## 2023-04-03 ENCOUNTER — Other Ambulatory Visit: Payer: Self-pay | Admitting: *Deleted

## 2023-04-03 ENCOUNTER — Other Ambulatory Visit: Payer: Self-pay | Admitting: Family Medicine

## 2023-04-03 MED ORDER — TRIAZOLAM 0.25 MG PO TABS: ORAL_TABLET | ORAL | 0 refills | Status: DC

## 2023-04-03 MED ORDER — TRIAZOLAM 0.25 MG PO TABS: ORAL_TABLET | ORAL | 5 refills | Status: DC

## 2023-04-03 NOTE — Addendum Note (Signed)
Addended by: Ardith Dark on: 04/03/2023 03:23 PM   Modules accepted: Orders

## 2023-04-03 NOTE — Telephone Encounter (Signed)
Prescription Request  04/03/2023  LOV: 02/10/2023  What is the name of the medication or equipment? triazolam (HALCION) 0.25 MG tablet   Have you contacted your pharmacy to request a refill? Yes   Which pharmacy would you like this sent to?  [id number] [address], [place] - [address] [place] [address] Phone: [phone] Fax: [phone]    Patient notified that their request is being sent to the clinical staff for review and that they should receive a response within 2 business days.   Please advise at Mobile [phone] (mobile)

## 2023-04-11 ENCOUNTER — Inpatient Hospital Stay: Payer: Medicare Other | Attending: Hematology | Admitting: Hematology

## 2023-04-11 DIAGNOSIS — E538 Deficiency of other specified B group vitamins: Secondary | ICD-10-CM | POA: Insufficient documentation

## 2023-04-11 DIAGNOSIS — E559 Vitamin D deficiency, unspecified: Secondary | ICD-10-CM | POA: Insufficient documentation

## 2023-04-11 DIAGNOSIS — D75839 Thrombocytosis, unspecified: Secondary | ICD-10-CM | POA: Insufficient documentation

## 2023-04-11 DIAGNOSIS — M797 Fibromyalgia: Secondary | ICD-10-CM | POA: Insufficient documentation

## 2023-04-11 DIAGNOSIS — E785 Hyperlipidemia, unspecified: Secondary | ICD-10-CM | POA: Diagnosis not present

## 2023-04-11 DIAGNOSIS — M8580 Other specified disorders of bone density and structure, unspecified site: Secondary | ICD-10-CM | POA: Diagnosis not present

## 2023-04-11 DIAGNOSIS — I10 Essential (primary) hypertension: Secondary | ICD-10-CM | POA: Insufficient documentation

## 2023-04-11 DIAGNOSIS — M069 Rheumatoid arthritis, unspecified: Secondary | ICD-10-CM | POA: Diagnosis not present

## 2023-04-11 DIAGNOSIS — G894 Chronic pain syndrome: Secondary | ICD-10-CM | POA: Insufficient documentation

## 2023-04-11 NOTE — Progress Notes (Signed)
[name]   HEMATOLOGY/ONCOLOGY CONSULTATION NOTE  Date of Service: 04/11/2023  Patient Care Team: Ardith Dark, MD as PCP - General (Family Medicine) Jodelle Red, MD as PCP - Cardiology (Cardiology) Luciano Cutter, MD as Consulting Physician (Pulmonary Disease) Andrena Mews, DO as Consulting Physician (Sports Medicine) Barnett Abu, MD as Consulting Physician (Neurosurgery)  CHIEF COMPLAINTS/PURPOSE OF CONSULTATION:  Elevated platelet counts HISTORY OF PRESENTING ILLNESS:   Patricia Underwood is a wonderful 72 y.o. female who has been referred to [place] by Dr Jacquiline Doe MD for evaluation and management of elevated platelet counts.  Patient with history of dyslipidemia, fibromyalgia, B12 deficiency, chronic back pain who on routine labs with her primary care physician on 02/11/2023 had a CBC which showed a normal hemoglobin of 13.9, normal WBC count of 8.3k and a slightly elevated platelet count of 461k.  Patient had repeat labs with her primary care physician on 03/17/2023 and platelet count remained elevated at 465k.  Prior to these 2 labs the patient has had a normal platelet counts previously. Patient notes no previous history of venous thromboembolism, heart attacks or strokes. No family history of blood disorders. Denies any recent acute infections trauma or surgeries. Notes that she is a former cigarette smoker but has quit about 3 to 4 years ago. Patient endorsed fibromyalgia and sicca syndrome with the possibility of Sjogren's disease. No fevers no chills no night sweats.  No new bone pains.  No unexpected weight loss.  No other acute new focal symptoms.  MEDICAL HISTORY:  Past Medical History:  Diagnosis Date   Aortic atherosclerosis (HCC)    B12 deficiency, Rx B12 injections 07/05/2017   Chronic back pain    Chronic right shoulder pain 05/30/2017   Coronary artery calcification seen on CAT scan    Esophageal spasm    Fibromyalgia    HLD (hyperlipidemia) 05/30/2017   . 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   Essential hypertension 08/14/2022   Chronic low back pain with sciatica 09/25/2021   Cervical radiculopathy 09/25/2021   Alopecia 09/25/2021   Short-term memory loss 07/23/2021   Rheumatoid arthritis (HCC) 03/28/2021   Migraine 03/28/2021   Knee pain 02/14/2021   Esophageal spasm    Aortic atherosclerosis (HCC)    Coronary artery disease due to calcified coronary lesion 07/30/2020   IGT (impaired glucose tolerance) 03/20/2020   Chronic pain syndrome 07/04/2019   Dysphasia 04/21/2019   Sicca syndrome (HCC) 04/21/2019   Dental caries 04/21/2019   Osteopenia 10/25/2018   Fibromyalgia 09/11/2018   Sensorineural hearing loss (SNHL) of both ears, followed by [facility] [facility] 07/20/2018   Vitamin D deficiency 07/05/2017   B12 deficiency, Rx B12 injections 07/05/2017   Insomnia 07/05/2017   Chronic right shoulder pain 05/30/2017   HLD (hyperlipidemia) 05/30/2017      SURGICAL HISTORY: Past Surgical History:  Procedure Laterality Date   ABDOMINAL HYSTERECTOMY     KNEE SURGERY Right    LIGAMENT REPAIR Left 12/15/2022   Thumb   LUMBAR FUSION     L4-L5   SHOULDER ARTHROSCOPY Right     SOCIAL HISTORY: Social History   Socioeconomic History   Marital status: Married    Spouse name: Not on file   Number of children: Not on file   Years of education: Not on file   Highest education level: Not on file  Occupational History   Occupation: Retired   Tobacco Use  Smoking status: Former    Types: Cigarettes    Passive exposure: Never   Smokeless tobacco: Never   Tobacco comments:    quit in [place] 2021  Vaping Use   Vaping Use: Never used  Substance and Sexual Activity   Alcohol use: Yes    Alcohol/week: 7.0 standard drinks of alcohol    Types: 7 Glasses of wine per week   Drug use: No   Sexual activity: Not Currently    Partners:  Male    Birth control/protection: None  Other Topics Concern   Not on file  Social History Narrative   Not on file   Social Determinants of Health   Financial Resource Strain: Low Risk  (10/28/2022)   Overall Financial Resource Strain (CARDIA)    Difficulty of Paying Living Expenses: Not hard at all  Food Insecurity: No Food Insecurity (10/28/2022)   Hunger Vital Sign    Worried About Running Out of Food in the Last Year: Never true    Ran Out of Food in the Last Year: Never true  Transportation Needs: No Transportation Needs (10/28/2022)   PRAPARE - Administrator, Civil Service (Medical): No    Lack of Transportation (Non-Medical): No  Physical Activity: Sufficiently Active (10/28/2022)   Exercise Vital Sign    Days of Exercise per Week: 7 days    Minutes of Exercise per Session: 120 min  Stress: No Stress Concern Present (10/28/2022)   [employer] of Occupational Health - Occupational Stress Questionnaire    Feeling of Stress : Not at all  Social Connections: Moderately Isolated (10/28/2022)   Social Connection and Isolation Panel [NHANES]    Frequency of Communication with Friends and Family: More than three times a week    Frequency of Social Gatherings with Friends and Family: More than three times a week    Attends Religious Services: Never    Database administrator or Organizations: No    Attends Banker Meetings: Never    Marital Status: Married  Catering manager Violence: Not At Risk (10/28/2022)   Humiliation, Afraid, Rape, and Kick questionnaire    Fear of Current or Ex-Partner: No    Emotionally Abused: No    Physically Abused: No    Sexually Abused: No    FAMILY HISTORY: Family History  Problem Relation Age of Onset   CAD Mother        died of massive MI at 14   CVA Father        x 2   Hyperlipidemia Sister    Diabetes Neg Hx     ALLERGIES:  is allergic to [employer] tartrate], amlodipine, flexeril  [cyclobenzaprine], toradol [ketorolac tromethamine], and naproxen.  MEDICATIONS:  Current Outpatient Medications  Medication Sig Dispense Refill   atorvastatin (LIPITOR) 20 MG tablet Take 1 tablet (20 mg total) by mouth daily. 90 tablet 3   cyanocobalamin (VITAMIN B12) 1000 MCG/ML injection 1000 mcg (1 mg) injection once every other week 6 mL 3   triazolam (HALCION) 0.25 MG tablet TAKE ONE TABLET BY MOUTH EVERY NIGHT AT BEDTIME AS NEEDED FOR SLEEP 30 tablet 5   Vitamin D, Ergocalciferol, (DRISDOL) 1.25 MG (50000 UNIT) CAPS capsule Take 1 capsule (50,000 Units total) by mouth every 7 (seven) days. 12 capsule 3   Current Facility-Administered Medications  Medication Dose Route Frequency Provider Last Rate Last Admin   denosumab (PROLIA) injection 60 mg  60 mg Subcutaneous Once Ardith Dark, MD  REVIEW OF SYSTEMS:    10 Point review of Systems was done is negative except as noted above.  PHYSICAL EXAMINATION: ECOG PERFORMANCE STATUS: 1 - Symptomatic but completely ambulatory  Vital signs stable GENERAL:alert, in no acute distress and comfortable SKIN: no acute rashes, no significant lesions EYES: conjunctiva are pink and non-injected, sclera anicteric OROPHARYNX: MMM, no exudates, no oropharyngeal erythema or ulceration NECK: supple, no JVD LYMPH:  no palpable lymphadenopathy in the cervical, axillary or inguinal regions LUNGS: clear to auscultation b/l with normal respiratory effort HEART: regular rate & rhythm ABDOMEN:  normoactive bowel sounds , non tender, not distended.  No palpable hepatosplenomegaly. Extremity: no pedal edema PSYCH: alert & oriented x 3 with fluent speech NEURO: no focal motor/sensory deficits  LABORATORY DATA:  I have reviewed the data as listed  .    Latest Ref Rng & Units 03/17/2023   10:48 AM 02/11/2023   11:19 AM 12/24/2022   11:13 AM  CBC  WBC 4.0 - 10.5 K/uL 8.6  8.3  8.7   Hemoglobin 12.0 - 15.0 g/dL 16.1  09.6  04.5   Hematocrit 36.0 -  46.0 % 39.3  40.9  42.1   Platelets 150.0 - 400.0 K/uL 465.0  461.0  362.0     .    Latest Ref Rng & Units 02/11/2023   11:19 AM 12/24/2022   11:13 AM 01/27/2022    8:13 AM  CMP  Glucose 70 - 99 mg/dL 71  89  95   BUN 6 - 23 mg/dL 18  12  16    Creatinine 0.40 - 1.20 mg/dL 4.09  8.11  9.14   Sodium 135 - 145 mEq/L 141  141  139   Potassium 3.5 - 5.1 mEq/L 4.8  4.9  4.5   Chloride 96 - 112 mEq/L 106  104  103   CO2 19 - 32 mEq/L 28  30  28    Calcium 8.4 - 10.5 mg/dL 9.8  9.5  8.9   Total Protein 6.0 - 8.3 g/dL 6.5  6.3  6.4   Total Bilirubin 0.2 - 1.2 mg/dL 0.6  0.5  0.5   Alkaline Phos 39 - 117 U/L 76  87  72   AST 0 - 37 U/L 15  15  20    ALT 0 - 35 U/L 14  11  19     . Lab Results  Component Value Date   IRON 125 03/17/2023   TIBC 348 03/17/2023   IRONPCTSAT 36 03/17/2023   (Iron and TIBC)  Lab Results  Component Value Date   FERRITIN 98 03/17/2023     RADIOGRAPHIC STUDIES: I have personally reviewed the radiological images as listed and agreed with the findings in the report. No results found.  ASSESSMENT & PLAN:   72 year old very pleasant female with  #1 Thrombocytosis with platelets of 465k Rule out essential thrombocytosis PLan -Patient history and physical was done in detail. -We discussed the various etiologies of elevated platelet counts including primary myeloproliferative disorders like essential thrombocytosis versus common etiology of reactive thrombocytosis. -We will send out MPN NGS panel to rule out clonal markers associated with essential thrombocytosis. -No overt reactive etiology noted.  Patient does not currently smoke.  No overt evidence of bleeding.  No other focal symptoms suggestive of malignancy. Does have sicca symptoms and possible Sjogren's disease or other autoimmune condition.  This can cause inflammation (reactive increase in platelets. -Will check soluble transferrin receptor to ensure there is no subcu iron deficiency. -Labs  ordered  for further workup -Will set up a phone visit to go over her lab results available  Follow-up Labs on Monday Phone visit with Dr. Candise Che in 2 weeks after labs.  #2  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   Essential hypertension 08/14/2022   Chronic low back pain with sciatica 09/25/2021   Cervical radiculopathy 09/25/2021   Alopecia 09/25/2021   Short-term memory loss 07/23/2021   Rheumatoid arthritis (HCC) 03/28/2021   Migraine 03/28/2021   Knee pain 02/14/2021   Esophageal spasm    Aortic atherosclerosis (HCC)    Coronary artery disease due to calcified coronary lesion 07/30/2020   IGT (impaired glucose tolerance) 03/20/2020   Chronic pain syndrome 07/04/2019   Dysphasia 04/21/2019   Sicca syndrome (HCC) 04/21/2019   Dental caries 04/21/2019   Osteopenia 10/25/2018   Fibromyalgia 09/11/2018   Sensorineural hearing loss (SNHL) of both ears, followed by [facility] 07/20/2018   Vitamin D deficiency 07/05/2017   B12 deficiency, Rx B12 injections 07/05/2017   Insomnia 07/05/2017   Chronic right shoulder pain 05/30/2017   HLD (hyperlipidemia) 05/30/2017    . Orders Placed This Encounter  Procedures   CBC with Differential (Cancer Center Only)    Standing Status:   Future    Number of Occurrences:   1    Standing Expiration Date:   04/10/2024   CMP (Cancer Center only)    Standing Status:   Future    Number of Occurrences:   1    Standing Expiration Date:   04/10/2024   JAK2 (including V617F and Exon 12), MPL, and CALR-Next Generation Sequencing    Standing Status:   Future    Number of Occurrences:   1    Standing Expiration Date:   04/10/2024   Soluble transferrin receptor    Standing Status:   Future    Number of Occurrences:   1    Standing Expiration Date:   04/10/2024   Ferritin    Standing Status:   Future    Number of Occurrences:   1    Standing Expiration Date:    04/10/2024   Sedimentation rate    Standing Status:   Future    Number of Occurrences:   1    Standing Expiration Date:   04/10/2024   C-reactive protein    Standing Status:   Future    Number of Occurrences:   1    Standing Expiration Date:   04/10/2024   The total time spent in the appointment was 60 minutes*.  All of the patient's questions were answered with apparent satisfaction. The patient knows to call the clinic with any problems, questions or concerns.   Wyvonnia Lora MD MS AAHIVMS [facility] Hematology/Oncology Physician [facility]  .*Total Encounter Time as defined by the Centers for Medicare and Medicaid Services includes, in addition to the face-to-face time of a patient visit (documented in the note above) non-face-to-face time: obtaining and reviewing outside history, ordering and reviewing medications, tests or procedures, care coordination (communications with other health care professionals or caregivers) and documentation in the medical record.

## 2023-04-13 ENCOUNTER — Inpatient Hospital Stay: Payer: Medicare Other

## 2023-04-13 DIAGNOSIS — D75839 Thrombocytosis, unspecified: Secondary | ICD-10-CM

## 2023-04-13 DIAGNOSIS — E785 Hyperlipidemia, unspecified: Secondary | ICD-10-CM | POA: Diagnosis not present

## 2023-04-13 DIAGNOSIS — M797 Fibromyalgia: Secondary | ICD-10-CM | POA: Diagnosis not present

## 2023-04-13 DIAGNOSIS — E538 Deficiency of other specified B group vitamins: Secondary | ICD-10-CM | POA: Diagnosis not present

## 2023-04-13 DIAGNOSIS — I10 Essential (primary) hypertension: Secondary | ICD-10-CM | POA: Diagnosis not present

## 2023-04-13 DIAGNOSIS — G894 Chronic pain syndrome: Secondary | ICD-10-CM | POA: Diagnosis not present

## 2023-04-13 LAB — CMP (CANCER CENTER ONLY)
ALT: 21 U/L (ref 0–44)
AST: 17 U/L (ref 15–41)
Albumin: 4.7 g/dL (ref 3.5–5.0)
Alkaline Phosphatase: 65 U/L (ref 38–126)
Anion gap: 9 (ref 5–15)
BUN: 20 mg/dL (ref 8–23)
CO2: 27 mmol/L (ref 22–32)
Calcium: 9.4 mg/dL (ref 8.9–10.3)
Chloride: 103 mmol/L (ref 98–111)
Creatinine: 0.62 mg/dL (ref 0.44–1.00)
GFR, Estimated: >60 mL/min (ref >60)
Glucose, Bld: 148 mg/dL — ABNORMAL HIGH (ref 70–99)
Potassium: 4.2 mmol/L (ref 3.5–5.1)
Sodium: 139 mmol/L (ref 135–145)
Total Bilirubin: 0.5 mg/dL (ref 0.3–1.2)
Total Protein: 7.3 g/dL (ref 6.5–8.1)

## 2023-04-13 LAB — CBC WITH DIFFERENTIAL (CANCER CENTER ONLY)
Abs Immature Granulocytes: 0.17 10*3/uL — ABNORMAL HIGH (ref 0.00–0.07)
Basophils Absolute: 0 10*3/uL (ref 0.0–0.1)
Basophils Relative: 0 %
Eosinophils Absolute: 0 10*3/uL (ref 0.0–0.5)
Eosinophils Relative: 0 %
HCT: 41.9 % (ref 36.0–46.0)
Hemoglobin: 14.3 g/dL (ref 12.0–15.0)
Immature Granulocytes: 2 %
Lymphocytes Relative: 14 %
Lymphs Abs: 1.4 10*3/uL (ref 0.7–4.0)
MCH: 31 pg (ref 26.0–34.0)
MCHC: 34.1 g/dL (ref 30.0–36.0)
MCV: 90.7 fL (ref 80.0–100.0)
Monocytes Absolute: 0.1 10*3/uL (ref 0.1–1.0)
Monocytes Relative: 1 %
Neutro Abs: 8 10*3/uL — ABNORMAL HIGH (ref 1.7–7.7)
Neutrophils Relative %: 83 %
Platelet Count: 412 10*3/uL — ABNORMAL HIGH (ref 150–400)
RBC: 4.62 MIL/uL (ref 3.87–5.11)
RDW: 12.8 % (ref 11.5–15.5)
WBC Count: 9.7 10*3/uL (ref 4.0–10.5)
nRBC: 0 % (ref 0.0–0.2)

## 2023-04-13 LAB — FERRITIN: Ferritin: 104 ng/mL (ref 11–307)

## 2023-04-13 LAB — C-REACTIVE PROTEIN: CRP: 0.6 mg/dL (ref <1.0)

## 2023-04-13 LAB — SEDIMENTATION RATE: Sed Rate: 2 mm/hr (ref 0–22)

## 2023-04-14 LAB — SOLUBLE TRANSFERRIN RECEPTOR: Transferrin Receptor: 11.1 nmol/L — ABNORMAL LOW (ref 12.2–27.3)

## 2023-04-15 ENCOUNTER — Encounter: Payer: Self-pay | Admitting: Family Medicine

## 2023-04-15 ENCOUNTER — Ambulatory Visit (INDEPENDENT_AMBULATORY_CARE_PROVIDER_SITE_OTHER): Payer: Medicare Other | Admitting: Family Medicine

## 2023-04-15 VITALS — BP 137/80 | HR 83 | Temp 97.7°F | Ht 62.0 in | Wt 114.4 lb

## 2023-04-15 DIAGNOSIS — D75839 Thrombocytosis, unspecified: Secondary | ICD-10-CM | POA: Diagnosis not present

## 2023-04-15 DIAGNOSIS — F5102 Adjustment insomnia: Secondary | ICD-10-CM | POA: Diagnosis not present

## 2023-04-15 DIAGNOSIS — I10 Essential (primary) hypertension: Secondary | ICD-10-CM

## 2023-04-15 DIAGNOSIS — G894 Chronic pain syndrome: Secondary | ICD-10-CM | POA: Diagnosis not present

## 2023-04-15 MED ORDER — OXYCODONE-ACETAMINOPHEN 5-325 MG PO TABS: 1.0000 | ORAL_TABLET | Freq: Four times a day (QID) | ORAL | 0 refills | Status: DC | PRN

## 2023-04-15 NOTE — Assessment & Plan Note (Addendum)
Patient still not controlled.  She would like to be referred to a tertiary care center for pain management.  Her current pain is not being adequately controlled with the belbuca and hydrocodone that was prescribed by her pain physician at [facility]. She has been on multiple meds in the past and has usually tolerated well though prefers oral medication due to concern for side effects.  She has worked with orthopedics and has had multiple rounds of nonnarcotic therapy which has not been successful.  She does feel like the medications improve her quality of life and ability perform ADLs.  She has not had any significant side effects with her pain medications in the past.  She requests a refill from [place] today until she can get into see the pain clinic.  Database was reviewed today without any red flags. She does have two recent fills for 30-day supply of hydrocodone on over the last couple of months but these are adequately spaced out.  She has tolerated this well without side effects.  We will give short-term supply of Percocet for her today.  This was last prescribed a few months ago.  She is also tolerated this well without side effects.  She is aware to not take this at the same time as her triazolam.  Discussed with patient that I would be unable to provide chronic narcotics and that she should not be getting multiple prescriptions from different providers.  Also discussed that I would not be able to increase her dose of medications any higher than what she is prescribed. She voiced understanding and she will not seek any refills from her previous providers.  Will be referring to pain clinic at tertiary care center as above.

## 2023-04-15 NOTE — Progress Notes (Signed)
Patricia Underwood is a 72 y.o. female who presents today for an office visit.  Assessment/Plan:  Chronic Problems Addressed Today: Chronic pain syndrome Patient still not controlled.  She would like to be referred to a tertiary care center for pain management.  Her current pain is not being adequately controlled with the belbuca and hydrocodone that was prescribed by her pain physician at [facility]. She has been on multiple meds in the past and has usually tolerated well though prefers oral medication due to concern for side effects.  She has worked with orthopedics and has had multiple rounds of nonnarcotic therapy which has not been successful.  She does feel like the medications improve her quality of life and ability perform ADLs.  She has not had any significant side effects with her pain medications in the past.  She requests a refill from [place] today until she can get into see the pain clinic.  Database was reviewed today without any red flags. She does have two recent fills for 30-day supply of hydrocodone on over the last couple of months but these are adequately spaced out.  She has tolerated this well without side effects.  We will give short-term supply of Percocet for her today.  This was last prescribed a few months ago.  She is also tolerated this well without side effects.  She is aware to not take this at the same time as her triazolam.  Discussed with patient that I would be unable to provide chronic narcotics and that she should not be getting multiple prescriptions from different providers.  Also discussed that I would not be able to increase her dose of medications any higher than what she is prescribed. She voiced understanding and she will not seek any refills from her previous providers.  Will be referring to pain clinic at tertiary care center as above.   Insomnia Uses triazolam as needed.  Does not need refill today.  She is aware to not take this at the same time as any  narcotics.  Thrombocytosis Still slightly elevated on recent labs.  She will follow-up with oncology as previously planned.  Essential hypertension Initially elevated but at goal on recheck.  She will monitor at home and let us know if persistently elevated.     Subjective:  HPI:  See Assessment / plan for status of chronic conditions.    Patient is here today for follow-up.  Last saw her a couple months ago.  At our last visit she was having ongoing issues with chronic pain.  This is lungs with history of rheumatoid arthritis and osteoarthritis.  We had been intermittently treating her pain and she is also been seeing orthopedics they have been managing her pain.  She has intermittently been on narcotics on and off for this in the past.  We did refer her to the pain clinic at our last office visit.  She was prescribed hydrocodone and belbuca.  She had concerns about using Belbuca due to her dental issues and discussed with this with her pharmacist who recommended that she not start this.  She does have an upcoming appoint with pain clinic however would like to be seen at a different clinic.  Pain is still poorly controlled.   She also saw the hematologist since her last visit.  Lab work is still pending.      Objective:  Physical Exam: BP 137/80   Pulse 83   Temp 97.7 F (36.5 C) (Temporal)   Ht 5'  2" (1.575 m)   Wt 114 lb 6.4 oz (51.9 kg)   SpO2 98%   BMI 20.92 kg/m   Gen: No acute distress, resting comfortably Neuro: Grossly normal, moves all extremities Psych: Normal affect and thought content  Time Spent: 40 minutes of total time was spent on the date of the encounter performing the following actions: chart review prior to seeing the patient including recent specialists visit, obtaining history, performing a medically necessary exam, counseling on the treatment plan, placing orders, and documenting in our EHR.        Katina Degree. Jimmey Ralph, MD 04/15/2023 10:22 AM

## 2023-04-15 NOTE — Assessment & Plan Note (Signed)
Still slightly elevated on recent labs.  She will follow-up with oncology as previously planned.

## 2023-04-15 NOTE — Assessment & Plan Note (Signed)
Uses triazolam as needed.  Does not need refill today.  She is aware to not take this at the same time as any narcotics.

## 2023-04-15 NOTE — Assessment & Plan Note (Signed)
Initially elevated but at goal on recheck.  She will monitor at home and let us know if persistently elevated.

## 2023-04-15 NOTE — Patient Instructions (Signed)
It was very nice to see you today!  I will refer you to see the pain clinic.  We will refill your pain meds today.  Take care, Dr Jimmey Ralph  PLEASE NOTE:  If you had any lab tests, please let us know if you have not heard back within a few days. You may see your results on mychart before we have a chance to review them but we will give you a call once they are reviewed by [place].   If we ordered any referrals today, please let us know if you have not heard from their office within the next week.   If you had any urgent prescriptions sent in today, please check with the pharmacy within an hour of our visit to make sure the prescription was transmitted appropriately.   Please try these tips to maintain a healthy lifestyle:  Eat at least 3 REAL meals and 1-2 snacks per day.  Aim for no more than 5 hours between eating.  If you eat breakfast, please do so within one hour of getting up.   Each meal should contain half fruits/vegetables, one quarter protein, and one quarter carbs (no bigger than a computer mouse)  Cut down on sweet beverages. This includes juice, soda, and sweet tea.   Drink at least 1 glass of water with each meal and aim for at least 8 glasses per day  Exercise at least 150 minutes every week.

## 2023-04-20 LAB — JAK2 (INCLUDING V617F AND EXON 12), MPL,& CALR-NEXT GEN SEQ

## 2023-04-21 ENCOUNTER — Ambulatory Visit (INDEPENDENT_AMBULATORY_CARE_PROVIDER_SITE_OTHER): Payer: Medicare Other | Admitting: Gastroenterology

## 2023-04-21 ENCOUNTER — Encounter: Payer: Self-pay | Admitting: Gastroenterology

## 2023-04-21 VITALS — BP 128/64 | HR 76 | Ht 62.0 in | Wt 115.5 lb

## 2023-04-21 DIAGNOSIS — Z800 Family history of malignant neoplasm of digestive organs: Secondary | ICD-10-CM | POA: Diagnosis not present

## 2023-04-21 DIAGNOSIS — R12 Heartburn: Secondary | ICD-10-CM | POA: Diagnosis not present

## 2023-04-21 MED ORDER — NA SULFATE-K SULFATE-MG SULF 17.5-3.13-1.6 GM/177ML PO SOLN: 1.0000 | Freq: Once | ORAL | 0 refills | Status: AC

## 2023-04-21 NOTE — Progress Notes (Signed)
[facility] Consult Note:  History: Patricia Underwood 04/21/2023  Referring provider: Ardith Dark, MD  Reason for consult/chief complaint: Colon Cancer Screening (Pt requesting a colonoscopy because she haven't had one in at least 10 years in [place], can't remember if had any polyps), esophageal burning (Intermittent esophageal burning, Pt states she had an Endoscopy about 14 years ago and had gastric polyps), and Family  Hx Of Colon Cancer (Father)   Subjective  HPI: 72 year old woman not previously seen by this practice referred for colon cancer screening and upper digestive symptoms..  Today, she is requesting a screening colonoscopy as she has not had one in 10 years. She reports having an EGD about 14 years ago for symptoms of reflux and phlegm where gastric polyps were found.  She complains of an intermittent throat sensation that she typically experiences when eating dinner in the evening manifest as excess mucus production.  There is somewhat less frequent heartburn.  Her symptoms would occasionally prevent her from having her dinner. She states that she hasn't experienced these symptoms in about a year.  Denies dysphagia or odynophagia.  Patricia Underwood denies diarrhea, constipation, nausea, blood in stool, black stool, vomiting, abdominal pain, bloating, unintentional weight loss, dysphagia.   ROS:  Review of Systems  Constitutional:  Negative for appetite change and fever.  HENT:  Negative for trouble swallowing.   Respiratory:  Negative for cough and shortness of breath.   Cardiovascular:  Negative for chest pain.  Gastrointestinal:  Negative for abdominal distention, abdominal pain, anal bleeding, blood in stool, constipation, diarrhea, nausea, rectal pain and vomiting.       +reflux/heartburn   Genitourinary:  Negative for dysuria.  Musculoskeletal:  Negative for back pain.  Skin:  Negative for rash.  Neurological:  Negative for weakness.  All other  systems reviewed and are negative.    Past Medical History: Past Medical History:  Diagnosis Date   Aortic atherosclerosis (HCC)    Arthritis    B12 deficiency, Rx B12 injections 07/05/2017   Chronic back pain    Chronic right shoulder pain 05/30/2017   Coronary artery calcification seen on CAT scan    Diverticulitis    Diverticulosis    Esophageal spasm    Fibromyalgia    HLD (hyperlipidemia) 05/30/2017   HTN (hypertension)    Torn meniscus    right     Past Surgical History: Past Surgical History:  Procedure Laterality Date   ABDOMINAL HYSTERECTOMY     LIGAMENT REPAIR Left 12/15/2022   Thumb   LUMBAR FUSION     L4-L5   SHOULDER ARTHROSCOPY Right    TONSILLECTOMY AND ADENOIDECTOMY       Family History: Family History  Problem Relation Age of Onset   CAD Mother        died of massive MI at 99   Heart attack Mother    CVA Father        x 2   Colon cancer Father    Hyperlipidemia Sister    Stroke Paternal Grandfather    Hypothyroidism Daughter    Hyperthyroidism Son    Diabetes Neg Hx     Social History: Social History   Socioeconomic History   Marital status: Married    Spouse name: Not on file   Number of children: 2   Years of education: Not on file   Highest education level: Not on file  Occupational History   Occupation: Retired   Tobacco Use   Smoking status: Former  Types: Cigarettes    Quit date: 2018    Years since quitting: 6.4    Passive exposure: Never   Smokeless tobacco: Never  Vaping Use   Vaping Use: Never used  Substance and Sexual Activity   Alcohol use: Yes    Alcohol/week: 7.0 standard drinks of alcohol    Types: 7 Glasses of wine per week    Comment: 1 glass of wine nightly   Drug use: No   Sexual activity: Not Currently    Partners: Male    Birth control/protection: None  Other Topics Concern   Not on file  Social History Narrative   Not on file   Social Determinants of Health   Financial Resource Strain:  Low Risk  (10/28/2022)   Overall Financial Resource Strain (CARDIA)    Difficulty of Paying Living Expenses: Not hard at all  Food Insecurity: No Food Insecurity (10/28/2022)   Hunger Vital Sign    Worried About Running Out of Food in the Last Year: Never true    Ran Out of Food in the Last Year: Never true  Transportation Needs: No Transportation Needs (10/28/2022)   PRAPARE - Administrator, Civil Service (Medical): No    Lack of Transportation (Non-Medical): No  Physical Activity: Sufficiently Active (10/28/2022)   Exercise Vital Sign    Days of Exercise per Week: 7 days    Minutes of Exercise per Session: 120 min  Stress: No Stress Concern Present (10/28/2022)   Patricia Underwood [employer] Occupational [employer] - Occupational Stress Questionnaire    Feeling of Stress : Not at all  Social Connections: Moderately Isolated (10/28/2022)   Social Connection and Isolation Panel [NHANES]    Frequency of Communication with Friends and Family: More than three times a week    Frequency of Social Gatherings with Friends and Family: More than three times a week    Attends Religious Services: Never    Database administrator or Organizations: No    Attends Banker Meetings: Never    Marital Status: Married    Allergies: Allergies  Allergen Reactions   Ambien [Zolpidem Tartrate]    Flexeril [Cyclobenzaprine] Other (See Comments)    Patient unsure of reaction   Norvasc [Amlodipine] Swelling    Edema of feet and ankles    Toradol [Ketorolac Tromethamine]    Naproxen Other (See Comments)    Patient unsure of reaction.    Outpatient Meds: Current Outpatient Medications  Medication Sig Dispense Refill   atorvastatin (LIPITOR) 20 MG tablet Take 1 tablet (20 mg total) by mouth daily. 90 tablet 3   cyanocobalamin (VITAMIN B12) 1000 MCG/ML injection 1000 mcg (1 mg) injection once every other week 6 mL 3   Na Sulfate-K Sulfate-Mg Sulf 17.5-3.13-1.6 GM/177ML SOLN Take 1 kit by  mouth once for 1 dose. 354 mL 0   oxyCODONE-acetaminophen (PERCOCET/ROXICET) 5-325 MG tablet Take 1 tablet by mouth every 6 (six) hours as needed for severe pain. 90 tablet 0   triazolam (HALCION) 0.25 MG tablet TAKE ONE TABLET BY MOUTH EVERY NIGHT AT BEDTIME AS NEEDED FOR SLEEP 30 tablet 5   Vitamin D, Ergocalciferol, (DRISDOL) 1.25 MG (50000 UNIT) CAPS capsule Take 1 capsule (50,000 Units total) by mouth every 7 (seven) days. 12 capsule 3   Current Facility-Administered Medications  Medication Dose Route Frequency Provider Last Rate Last Admin   denosumab (PROLIA) injection 60 mg  60 mg Subcutaneous Once Ardith Dark, MD  ___________________________________________________________________ Objective   Exam:  BP 128/64 (BP Location: Left Arm, Patient Position: Sitting, Cuff Size: Normal)   Pulse 76   Ht 5\' 2"  (1.575 m) Comment: height measured without shoes  Wt 115 lb 8 oz (52.4 kg)   BMI 21.13 kg/m  Wt Readings from Last 3 Encounters:  04/21/23 115 lb 8 oz (52.4 kg)  04/15/23 114 lb 6.4 oz (51.9 kg)  02/10/23 108 lb 9.6 oz (49.3 kg)    General: well appearing.  Normal vocal quality Eyes: sclera anicteric, no redness ENT: oral mucosa moist without lesions, no cervical or supraclavicular lymphadenopathy CV: RRR, no JVD, no peripheral edema Resp: clear to auscultation bilaterally, normal RR and effort noted GI: soft, no tenderness, with active bowel sounds. No guarding or palpable organomegaly noted. Skin; warm and dry, no rash or jaundice noted Neuro: awake, alert and oriented x 3. Normal gross motor function and fluent speech    Assessment: Heartburn - Plan: Ambulatory referral to Gastroenterology  Family history of colon cancer in father - Plan: Ambulatory referral to Gastroenterology   Family history of colorectal cancer, colonoscopy recommended.  Throat symptoms, somewhat difficult to characterize, primarily episodic excess mucus production when eating.   Not clear if this is truly reflux versus a somewhat irritable pharynx phenomenon with upper airway mucus production.  She was somewhat concerned about the previous gastric polyps, no records for comparison.  There may have been fundic gland polyps if acid suppression was being used at that time.  She does not currently feel the need to take acid suppression medicine regularly. Plan: Colonoscopy and EGD    The benefits and risks of the planned procedure were described in detail with the patient or (when appropriate) their health care proxy.  Risks were outlined as including, but not limited to, bleeding, infection, perforation, adverse medication reaction leading to cardiac or pulmonary decompensation, pancreatitis (if ERCP).  The limitation of incomplete mucosal visualization was also discussed.  No guarantees or warranties were given.   Thank you for the courtesy of this consult.  Please call me with any questions or concerns.   - Amada Jupiter, MD    [facility]  Ladona Mow M Kadhim,acting as a scribe for Charlie Pitter III, MD.,have documented all relevant documentation on the behalf of Sherrilyn Rist, MD,as directed by  Sherrilyn Rist, MD while in the presence of Sherrilyn Rist, MD.   Marvis Repress III, MD, have reviewed all documentation for this visit. The documentation on 04/21/23 for the exam, diagnosis, procedures, and orders are all accurate and complete.   CC: Referring provider noted above

## 2023-04-21 NOTE — Progress Notes (Deleted)
[facility] Consult Note:  History: Patricia Underwood 04/21/2023  Referring provider: Ardith Dark, MD  Reason for consult/chief complaint: No chief complaint on file.   Subjective  HPI: 72 year old woman not previously seen by this practice referred for heartburn.  ***  ? Prior colon cancer screening ROS:  Review of Systems   Past Medical History: Past Medical History:  Diagnosis Date   Aortic atherosclerosis (HCC)    B12 deficiency, Rx B12 injections 07/05/2017   Chronic back pain    Chronic right shoulder pain 05/30/2017   Coronary artery calcification seen on CAT scan    Esophageal spasm    Fibromyalgia    HLD (hyperlipidemia) 05/30/2017     Past Surgical History: Past Surgical History:  Procedure Laterality Date   ABDOMINAL HYSTERECTOMY     KNEE SURGERY Right    LIGAMENT REPAIR Left 12/15/2022   Thumb   LUMBAR FUSION     L4-L5   SHOULDER ARTHROSCOPY Right      Family History: Family History  Problem Relation Age of Onset   CAD Mother        died of massive MI at 5   CVA Father        x 2   Hyperlipidemia Sister    Diabetes Neg Hx     Social History: Social History   Socioeconomic History   Marital status: Married    Spouse name: Not on file   Number of children: Not on file   Years of education: Not on file   Highest education level: Not on file  Occupational History   Occupation: Retired   Tobacco Use   Smoking status: Former    Types: Cigarettes    Passive exposure: Never   Smokeless tobacco: Never   Tobacco comments:    quit in [place] 2021  Vaping Use   Vaping Use: Never used  Substance and Sexual Activity   Alcohol use: Yes    Alcohol/week: 7.0 standard drinks of alcohol    Types: 7 Glasses of wine per week   Drug use: No   Sexual activity: Not Currently    Partners: Male    Birth control/protection: None  Other Topics Concern   Not on file  Social History Narrative   Not on file   Social  Determinants of Health   Financial Resource Strain: Low Risk  (10/28/2022)   Overall Financial Resource Strain (CARDIA)    Difficulty of Paying Living Expenses: Not hard at all  Food Insecurity: No Food Insecurity (10/28/2022)   Hunger Vital Sign    Worried About Running Out of Food in the Last Year: Never true    Ran Out of Food in the Last Year: Never true  Transportation Needs: No Transportation Needs (10/28/2022)   PRAPARE - Administrator, Civil Service (Medical): No    Lack of Transportation (Non-Medical): No  Physical Activity: Sufficiently Active (10/28/2022)   Exercise Vital Sign    Days of Exercise per Week: 7 days    Minutes of Exercise per Session: 120 min  Stress: No Stress Concern Present (10/28/2022)   [employer] of Occupational Health - Occupational Stress Questionnaire    Feeling of Stress : Not at all  Social Connections: Moderately Isolated (10/28/2022)   Social Connection and Isolation Panel [NHANES]    Frequency of Communication with Friends and Family: More than three times a week    Frequency of Social Gatherings with Friends and Family: More than three times  a week    Attends Religious Services: Never    Active Member of Clubs or Organizations: No    Attends Banker Meetings: Never    Marital Status: Married    Allergies: Allergies  Allergen Reactions   Ambien [Zolpidem Tartrate]    Amlodipine Swelling    Edema of feet and ankles    Flexeril [Cyclobenzaprine] Other (See Comments)    Patient unsure of reaction   Toradol [Ketorolac Tromethamine]    Naproxen Other (See Comments)    Patient unsure of reaction.    Outpatient Meds: Current Outpatient Medications  Medication Sig Dispense Refill   atorvastatin (LIPITOR) 20 MG tablet Take 1 tablet (20 mg total) by mouth daily. 90 tablet 3   cyanocobalamin (VITAMIN B12) 1000 MCG/ML injection 1000 mcg (1 mg) injection once every other week 6 mL 3   oxyCODONE-acetaminophen  (PERCOCET/ROXICET) 5-325 MG tablet Take 1 tablet by mouth every 6 (six) hours as needed for severe pain. 90 tablet 0   triazolam (HALCION) 0.25 MG tablet TAKE ONE TABLET BY MOUTH EVERY NIGHT AT BEDTIME AS NEEDED FOR SLEEP 30 tablet 5   Vitamin D, Ergocalciferol, (DRISDOL) 1.25 MG (50000 UNIT) CAPS capsule Take 1 capsule (50,000 Units total) by mouth every 7 (seven) days. 12 capsule 3   Current Facility-Administered Medications  Medication Dose Route Frequency Provider Last Rate Last Admin   denosumab (PROLIA) injection 60 mg  60 mg Subcutaneous Once Ardith Dark, MD          ___________________________________________________________________ Objective   Exam:  There were no vitals taken for this visit. Wt Readings from Last 3 Encounters:  04/15/23 114 lb 6.4 oz (51.9 kg)  02/10/23 108 lb 9.6 oz (49.3 kg)  01/27/23 109 lb 12.8 oz (49.8 kg)    General: ***  Eyes: sclera anicteric, no redness ENT: oral mucosa moist without lesions, no cervical or supraclavicular lymphadenopathy CV: ***, no JVD, no peripheral edema Resp: clear to auscultation bilaterally, normal RR and effort noted GI: soft, *** tenderness, with active bowel sounds. No guarding or palpable organomegaly noted. Skin; warm and dry, no rash or jaundice noted Neuro: awake, alert and oriented x 3. Normal gross motor function and fluent speech  Labs:  ***  Radiologic Studies:  ***  Assessment: No diagnosis found.  ***  Plan:  ***  Thank you for the courtesy of this consult.  Please call me with any questions or concerns.  Charlie Pitter III  CC: Referring provider noted above

## 2023-04-21 NOTE — Patient Instructions (Signed)
_______________________________________________________  If your blood pressure at your visit was 140/90 or greater, please contact your primary care physician to follow up on this.  _______________________________________________________  If you are age 72 or older, your body mass index should be between 23-30. Your Body mass index is 21.13 kg/m. If this is out of the aforementioned range listed, please consider follow up with your Primary Care Provider.  If you are age 18 or younger, your body mass index should be between 19-25. Your Body mass index is 21.13 kg/m. If this is out of the aformentioned range listed, please consider follow up with your Primary Care Provider.   ________________________________________________________  The [facility] providers would like to encourage you to use [facility] to communicate with providers for non-urgent requests or questions.  Due to long hold times on the telephone, sending your provider a message by [facility] may be a faster and more efficient way to get a response.  Please allow 48 business hours for a response.  Please remember that this is for non-urgent requests.  _______________________________________________________  Bonita Quin have been scheduled for a colonoscopy. Please follow written instructions given to you at your visit today.  Please pick up your prep supplies at the pharmacy within the next 1-3 days. If you use inhalers (even only as needed), please bring them with you on the day of your procedure.  Due to recent changes in healthcare laws, you may see the results of your imaging and laboratory studies on MyChart before your provider has had a chance to review them.  We understand that in some cases there may be results that are confusing or concerning to you. Not all laboratory results come back in the same time frame and the provider may be waiting for multiple results in order to interpret others.  Please give [place] 48 hours in order for your  provider to thoroughly review all the results before contacting the office for clarification of your results.   It was a pleasure to see you today!  Thank you for trusting me with your gastrointestinal care!

## 2023-05-01 ENCOUNTER — Inpatient Hospital Stay (HOSPITAL_BASED_OUTPATIENT_CLINIC_OR_DEPARTMENT_OTHER): Payer: Medicare Other | Admitting: Hematology

## 2023-05-01 DIAGNOSIS — D75839 Thrombocytosis, unspecified: Secondary | ICD-10-CM

## 2023-05-01 NOTE — Progress Notes (Signed)
HEMATOLOGY/ONCOLOGY PHONE VISIT NOTE  Date of Service: 05/01/23  Patient Care Team: Ardith Dark, MD as PCP - General (Family Medicine) Jodelle Red, MD as PCP - Cardiology (Cardiology) Luciano Cutter, MD as Consulting Physician (Pulmonary Disease) Andrena Mews, DO as Consulting Physician (Sports Medicine) Barnett Abu, MD as Consulting Physician (Neurosurgery)  CHIEF COMPLAINTS/PURPOSE OF CONSULTATION:  Elevated platelet counts  HISTORY OF PRESENTING ILLNESS:   Patricia Underwood is a wonderful 72 y.o. female who has been referred to [place] by Dr Jacquiline Doe MD for evaluation and management of elevated platelet counts.  Patient with history of dyslipidemia, fibromyalgia, B12 deficiency, chronic back pain who on routine labs with her primary care physician on 02/11/2023 had a CBC which showed a normal hemoglobin of 13.9, normal WBC count of 8.3k and a slightly elevated platelet count of 461k.  Patient had repeat labs with her primary care physician on 03/17/2023 and platelet count remained elevated at 465k.  Prior to these 2 labs the patient has had a normal platelet counts previously. Patient notes no previous history of venous thromboembolism, heart attacks or strokes. No family history of blood disorders. Denies any recent acute infections trauma or surgeries. Notes that she is a former cigarette smoker but has quit about 3 to 4 years ago. Patient endorsed fibromyalgia and sicca syndrome with the possibility of Sjogren's disease. No fevers no chills no night sweats.  No new bone pains.  No unexpected weight loss.  No other acute new focal symptoms.  INTERVAL HISTORY:  Patricia Underwood is a 72 y.o. female who is being connected with via telemedicine visit for continued evaluation and management of elevated platelet counts.   Patient was initially seen by me on 04/11/2023 and was doing well overall.  I connected with Patricia Underwood on 06/21/24at  3:30 PM EDT by telephone visit  and verified that I am speaking with the correct person using two identifiers.   I discussed the limitations, risks, security and privacy concerns of performing an evaluation and management service by telemedicine and the availability of in-person appointments. I also discussed with the patient that there may be a patient responsible charge related to this service. The patient expressed understanding and agreed to proceed.   Other persons participating in the visit and their role in the encounter: none   Patient's location: home  Provider's location: [facility]   Chief Complaint: Evaluation and management of elevated platelets    Today, she complains of extreme fatigue and generalized pain which she believes may be due to her autoimmune conditions. The results of her recent lab workup was discussed with her in detail.   MEDICAL HISTORY:  Past Medical History:  Diagnosis Date   Aortic atherosclerosis (HCC)    B12 deficiency, Rx B12 injections 07/05/2017   Chronic back pain    Chronic right shoulder pain 05/30/2017   Coronary artery calcification seen on CAT scan    Esophageal spasm    Fibromyalgia    HLD (hyperlipidemia) 05/30/2017  . 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   Essential hypertension 08/14/2022   Chronic low back pain with sciatica 09/25/2021   Cervical radiculopathy 09/25/2021   Alopecia 09/25/2021   Short-term memory loss 07/23/2021   Rheumatoid arthritis (HCC) 03/28/2021   Migraine 03/28/2021   Knee pain 02/14/2021   Esophageal spasm    Aortic atherosclerosis (HCC)    Coronary artery disease due to calcified coronary  lesion 07/30/2020   IGT (impaired glucose tolerance) 03/20/2020   Chronic pain syndrome 07/04/2019   Dysphasia 04/21/2019   Sicca syndrome (HCC) 04/21/2019   Dental caries 04/21/2019   Osteopenia 10/25/2018   Fibromyalgia 09/11/2018   Sensorineural hearing loss (SNHL) of  both ears, followed by [facility] [facility] 07/20/2018   Vitamin D deficiency 07/05/2017   B12 deficiency, Rx B12 injections 07/05/2017   Insomnia 07/05/2017   Chronic right shoulder pain 05/30/2017   HLD (hyperlipidemia) 05/30/2017      SURGICAL HISTORY: Past Surgical History:  Procedure Laterality Date   ABDOMINAL HYSTERECTOMY     KNEE SURGERY Right    LIGAMENT REPAIR Left 12/15/2022   Thumb   LUMBAR FUSION     L4-L5   SHOULDER ARTHROSCOPY Right     SOCIAL HISTORY: Social History   Socioeconomic History   Marital status: Married    Spouse name: Not on file   Number of children: Not on file   Years of education: Not on file   Highest education level: Not on file  Occupational History   Occupation: Retired   Tobacco Use   Smoking status: Former    Types: Cigarettes    Passive exposure: Never   Smokeless tobacco: Never   Tobacco comments:    quit in [place] 2021  Vaping Use   Vaping Use: Never used  Substance and Sexual Activity   Alcohol use: Yes    Alcohol/week: 7.0 standard drinks of alcohol    Types: 7 Glasses of wine per week   Drug use: No   Sexual activity: Not Currently    Partners: Male    Birth control/protection: None  Other Topics Concern   Not on file  Social History Narrative   Not on file   Social Determinants of Health   Financial Resource Strain: Low Risk  (10/28/2022)   Overall Financial Resource Strain (CARDIA)    Difficulty of Paying Living Expenses: Not hard at all  Food Insecurity: No Food Insecurity (10/28/2022)   Hunger Vital Sign    Worried About Running Out of Food in the Last Year: Never true    Ran Out of Food in the Last Year: Never true  Transportation Needs: No Transportation Needs (10/28/2022)   PRAPARE - Administrator, Civil Service (Medical): No    Lack of Transportation (Non-Medical): No  Physical Activity: Sufficiently Active (10/28/2022)   Exercise Vital Sign    Days of Exercise per Week: 7  days    Minutes of Exercise per Session: 120 min  Stress: No Stress Concern Present (10/28/2022)   [employer] of Occupational Health - Occupational Stress Questionnaire    Feeling of Stress : Not at all  Social Connections: Moderately Isolated (10/28/2022)   Social Connection and Isolation Panel [NHANES]    Frequency of Communication with Friends and Family: More than three times a week    Frequency of Social Gatherings with Friends and Family: More than three times a week    Attends Religious Services: Never    Database administrator or Organizations: No    Attends Banker Meetings: Never    Marital Status: Married  Catering manager Violence: Not At Risk (10/28/2022)   Humiliation, Afraid, Rape, and Kick questionnaire    Fear of Current or Ex-Partner: No    Emotionally Abused: No    Physically Abused: No    Sexually Abused: No    FAMILY HISTORY: Family History  Problem Relation Age  of Onset   CAD Mother        died of massive MI at 20   CVA Father        x 2   Hyperlipidemia Sister    Diabetes Neg Hx     ALLERGIES:  is allergic to [employer] tartrate], amlodipine, flexeril [cyclobenzaprine], toradol [ketorolac tromethamine], and naproxen.  MEDICATIONS:  Current Outpatient Medications  Medication Sig Dispense Refill   atorvastatin (LIPITOR) 20 MG tablet Take 1 tablet (20 mg total) by mouth daily. 90 tablet 3   cyanocobalamin (VITAMIN B12) 1000 MCG/ML injection 1000 mcg (1 mg) injection once every other week 6 mL 3   triazolam (HALCION) 0.25 MG tablet TAKE ONE TABLET BY MOUTH EVERY NIGHT AT BEDTIME AS NEEDED FOR SLEEP 30 tablet 5   Vitamin D, Ergocalciferol, (DRISDOL) 1.25 MG (50000 UNIT) CAPS capsule Take 1 capsule (50,000 Units total) by mouth every 7 (seven) days. 12 capsule 3   Current Facility-Administered Medications  Medication Dose Route Frequency Provider Last Rate Last Admin   denosumab (PROLIA) injection 60 mg  60 mg Subcutaneous Once  Ardith Dark, MD        REVIEW OF SYSTEMS:    10 Point review of Systems was done is negative except as noted above.   PHYSICAL EXAMINATION: telemedicine visit ECOG PERFORMANCE STATUS: 1 - Symptomatic but completely ambulatory   LABORATORY DATA:  I have reviewed the data as listed  .    Latest Ref Rng & Units 04/13/2023    1:22 PM 03/17/2023   10:48 AM 02/11/2023   11:19 AM  CBC  WBC 4.0 - 10.5 K/uL 9.7  8.6  8.3   Hemoglobin 12.0 - 15.0 g/dL 16.1  09.6  04.5   Hematocrit 36.0 - 46.0 % 41.9  39.3  40.9   Platelets 150 - 400 K/uL 412  465.0  461.0     .    Latest Ref Rng & Units 04/13/2023    1:22 PM 02/11/2023   11:19 AM 12/24/2022   11:13 AM  CMP  Glucose 70 - 99 mg/dL 409  71  89   BUN 8 - 23 mg/dL 20  18  12    Creatinine 0.44 - 1.00 mg/dL 8.11  9.14  7.82   Sodium 135 - 145 mmol/L 139  141  141   Potassium 3.5 - 5.1 mmol/L 4.2  4.8  4.9   Chloride 98 - 111 mmol/L 103  106  104   CO2 22 - 32 mmol/L 27  28  30    Calcium 8.9 - 10.3 mg/dL 9.4  9.8  9.5   Total Protein 6.5 - 8.1 g/dL 7.3  6.5  6.3   Total Bilirubin 0.3 - 1.2 mg/dL 0.5  0.6  0.5   Alkaline Phos 38 - 126 U/L 65  76  87   AST 15 - 41 U/L 17  15  15    ALT 0 - 44 U/L 21  14  11     . Lab Results  Component Value Date   IRON 125 03/17/2023   TIBC 348 03/17/2023   IRONPCTSAT 36 03/17/2023   (Iron and TIBC)  Lab Results  Component Value Date   FERRITIN 104 04/13/2023   04/13/2023 Mutation Profile:    RADIOGRAPHIC STUDIES: I have personally reviewed the radiological images as listed and agreed with the findings in the report. No results found.  ASSESSMENT & PLAN:   72 year old very pleasant female with  #1 Thrombocytosis with platelets of  465k Rule out essential thrombocytosis  PLAN:  -Discussed lab results from 04/13/2023 in detail with patient. CBC showed WBC of 9.7K, hemoglobin of 14.3, and platelets of 412K. -platelets nearly normalized at this time -total WBC count and hemoglobin levels  normal -CMP normal -ferritin level normal at 104 -no suggestion that elevated platelets are clonal and likely not a primary bone marrow problem -no obvious iron deficiency based on soluble transferrin receptor level of 11.1 -inflammatory markers normal, shows no obvious signs of significant systemic inflammation -04/13/2023 mutation profile showed no mutation suggesting essential thrombocytosis -mild inflammation likely due to autoimmune condition, passing viral infection, change in set point of bone marrow -answered all of patient's questions regarding lab workup -continue to follow with PCP regularly to manage any inflammatory arthritis, fibromyalgia, or infections  Follow-up RTC with PCP  #2  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   Essential hypertension 08/14/2022   Chronic low back pain with sciatica 09/25/2021   Cervical radiculopathy 09/25/2021   Alopecia 09/25/2021   Short-term memory loss 07/23/2021   Rheumatoid arthritis (HCC) 03/28/2021   Migraine 03/28/2021   Knee pain 02/14/2021   Esophageal spasm    Aortic atherosclerosis (HCC)    Coronary artery disease due to calcified coronary lesion 07/30/2020   IGT (impaired glucose tolerance) 03/20/2020   Chronic pain syndrome 07/04/2019   Dysphasia 04/21/2019   Sicca syndrome (HCC) 04/21/2019   Dental caries 04/21/2019   Osteopenia 10/25/2018   Fibromyalgia 09/11/2018   Sensorineural hearing loss (SNHL) of both ears, followed by [facility] 07/20/2018   Vitamin D deficiency 07/05/2017   B12 deficiency, Rx B12 injections 07/05/2017   Insomnia 07/05/2017   Chronic right shoulder pain 05/30/2017   HLD (hyperlipidemia) 05/30/2017    . Orders Placed This Encounter  Procedures   CBC with Differential (Cancer Center Only)    Standing Status:   Future    Number of Occurrences:   1    Standing Expiration Date:   04/10/2024   CMP  (Cancer Center only)    Standing Status:   Future    Number of Occurrences:   1    Standing Expiration Date:   04/10/2024   JAK2 (including V617F and Exon 12), MPL, and CALR-Next Generation Sequencing    Standing Status:   Future    Number of Occurrences:   1    Standing Expiration Date:   04/10/2024   Soluble transferrin receptor    Standing Status:   Future    Number of Occurrences:   1    Standing Expiration Date:   04/10/2024   Ferritin    Standing Status:   Future    Number of Occurrences:   1    Standing Expiration Date:   04/10/2024   Sedimentation rate    Standing Status:   Future    Number of Occurrences:   1    Standing Expiration Date:   04/10/2024   C-reactive protein    Standing Status:   Future    Number of Occurrences:   1    Standing Expiration Date:   04/10/2024   The total time spent in the appointment was 21 minutes* .  All of the patient's questions were answered with apparent satisfaction. The patient knows to call the clinic with any problems, questions or concerns.   Wyvonnia Lora MD MS AAHIVMS [facility] Hematology/Oncology Physician [facility]  .*  Total Encounter Time as defined by the Centers for Medicare and Medicaid Services includes, in addition to the face-to-face time of a patient visit (documented in the note above) non-face-to-face time: obtaining and reviewing outside history, ordering and reviewing medications, tests or procedures, care coordination (communications with other health care professionals or caregivers) and documentation in the medical record.    I,Mitra Faeizi,acting as a Neurosurgeon for Wyvonnia Lora, MD.,have documented all relevant documentation on the behalf of Wyvonnia Lora, MD,as directed by  Wyvonnia Lora, MD while in the presence of Wyvonnia Lora, MD.  .I have reviewed the above documentation for accuracy and completeness, and I agree with the above. Johney Maine MD

## 2023-05-06 ENCOUNTER — Other Ambulatory Visit: Payer: Self-pay | Admitting: Family Medicine

## 2023-05-06 NOTE — Telephone Encounter (Signed)
Prescription Request  05/06/2023  LOV: 04/15/2023  What is the name of the medication or equipment? oxyCODONE-acetaminophen (PERCOCET/ROXICET) 5-325 MG tablet   Pt unable to get in with pain management as of yet; has 2 pills left after tomorrow; just wants 1 more fill to last until able to get in with pain management  Have you contacted your pharmacy to request a refill? No   Which pharmacy would you like this sent to?  [facility] [id number] [place], [place] - [address] [place] [address] Phone: [phone] Fax: [phone]    Patient notified that their request is being sent to the clinical staff for review and that they should receive a response within 2 business days.   Please advise at Mobile [phone] (mobile)

## 2023-05-06 NOTE — Telephone Encounter (Signed)
Please see message, requesting one more refill for Percocet till she can get in with pain management.

## 2023-05-07 MED ORDER — OXYCODONE-ACETAMINOPHEN 5-325 MG PO TABS: 1.0000 | ORAL_TABLET | Freq: Four times a day (QID) | ORAL | 0 refills | Status: DC | PRN

## 2023-05-07 NOTE — Telephone Encounter (Signed)
Patient called for an update. States she was able to get scheduled with [facility] in the middle of July. Pt was unable to give exact time and date due to not being near calendar at time of call.

## 2023-05-19 ENCOUNTER — Encounter: Payer: Self-pay | Admitting: Internal Medicine

## 2023-05-19 ENCOUNTER — Ambulatory Visit: Payer: Medicare Other | Attending: Internal Medicine | Admitting: Internal Medicine

## 2023-05-19 VITALS — BP 148/83 | HR 72 | Resp 12 | Ht 62.0 in | Wt 114.0 lb

## 2023-05-19 DIAGNOSIS — M19041 Primary osteoarthritis, right hand: Secondary | ICD-10-CM | POA: Diagnosis not present

## 2023-05-19 DIAGNOSIS — G894 Chronic pain syndrome: Secondary | ICD-10-CM | POA: Diagnosis not present

## 2023-05-19 DIAGNOSIS — M3500 Sicca syndrome, unspecified: Secondary | ICD-10-CM | POA: Insufficient documentation

## 2023-05-19 DIAGNOSIS — M0609 Rheumatoid arthritis without rheumatoid factor, multiple sites: Secondary | ICD-10-CM

## 2023-05-19 DIAGNOSIS — M19042 Primary osteoarthritis, left hand: Secondary | ICD-10-CM | POA: Diagnosis not present

## 2023-05-19 MED ORDER — PREDNISONE 5 MG PO TABS
5.0000 mg | ORAL_TABLET | Freq: Every day | ORAL | 0 refills | Status: DC
Start: 2023-05-19 — End: 2023-06-25

## 2023-05-19 NOTE — Progress Notes (Signed)
Office Visit Note  Patient: Patricia Underwood             Date of Birth: 1951-09-26           MRN: [id number]             PCP: Ardith Dark, MD Referring: Ardith Dark, MD Visit Date: 05/19/2023   Subjective:  Follow-up   History of Present Illness: Patricia Underwood is a 72 y.o. female here for follow up follow-up for rheumatoid arthritis and sicca syndrome.  Since her last visit she is doing better with healing from her hand surgery still has ongoing daily joint pain and stiffness in multiple areas.  Not seeing a lot of visible swelling redness or warmth in affected sites.  Did not notice appreciable difference with trial of resuming the previously prescribed duloxetine.  Previous HPI 01/05/23 Patricia Underwood is a 72 y.o. female here for follow up for rheumatoid arthritis and sicca syndrome.  Evaluation at last visit without much peripheral joint synovitis noted recommended starting duloxetine for osteoarthritis related pain.  Shortly after starting medication she noticed new headaches so she stopped taking it and these headaches resolved.  She also started taking the pilocarpine again but she already had medicine on hand this is partially helpful and not noticing any major side effect.  Hand surgery for the extensor tendon rupture with Dr. Mina Marble when okay she is currently wearing immobilizing brace which causes a lot of difficulty as she is left-hand dominant.  She is also been recommended to avoid a lot of walking around because of previous falls incurring hand and wrist injuries.  She notices increased back and bilateral leg pain with her decrease in walking and overall physical activity.  No definite flareup of joint swelling.  Still concerned about the bony nodules on her fingers and feet.    Previous HPI 11/24/22 Patricia Underwood is a 72 y.o. female here for rheumatoid arthritis and sicca syndrome. She has a history of chronic joint pain in multiple areas with known  osteoarthritis and with  fibromyalgia syndrome. There has been some question of sjogren syndrome due to ongoing issues with dry mouth and accelerated dental decay but with negative serology. She saw Dr. Sharmon Revere last year for evaluation findings showing no objective peripheral joint synovitis and serum inflammatory markers and RA related antibodies were negative. She has been treated with intermittent prednisone which works well but the symptoms return when off treatment. More recently sustained a fall with associated wrist fracture and rupture of EPL tendon seeing hand surgery for this.  She does have somewhat generalized joint pains and morning stiffness lasting up to about 1 hour.  Typically does not see any obvious joint swelling or effusions.  She is not able to safely take oral nonsteroidal anti-inflammatory drugs. Dry mouth symptoms are pretty bothersome and she has experienced change in taste along with chronic irritation of the tongue.  She has been prescribed pilocarpine previously but did not take the medicine very consistently due to somewhat cost prohibitive and also had increased sweating side effects.  She does not have any particular history with inflammatory eye complications does not generally notice lymphadenopathy or any swelling in the area near major salivary glands.   Review of Systems  Constitutional:  Positive for fatigue.  HENT:  Positive for mouth dryness. Negative for mouth sores.   Eyes:  Positive for dryness.  Respiratory:  Negative for shortness of breath.   Cardiovascular:  Negative for chest pain and  palpitations.  Gastrointestinal:  Negative for blood in stool, constipation and diarrhea.  Endocrine: Negative for increased urination.  Genitourinary:  Negative for involuntary urination.  Musculoskeletal:  Positive for joint pain, joint pain, joint swelling, myalgias, morning stiffness and myalgias. Negative for gait problem, muscle weakness and muscle tenderness.  Skin:  Negative for color  change, rash, hair loss and sensitivity to sunlight.  Allergic/Immunologic: Negative for susceptible to infections.  Neurological:  Negative for dizziness and headaches.  Hematological:  Negative for swollen glands.  Psychiatric/Behavioral:  Positive for sleep disturbance. Negative for depressed mood. The patient is not nervous/anxious.     PMFS History:  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   Essential hypertension 08/14/2022   Chronic low back pain with sciatica 09/25/2021   Cervical radiculopathy 09/25/2021   Alopecia 09/25/2021   Short-term memory loss 07/23/2021   Rheumatoid arthritis (HCC) 03/28/2021   Migraine 03/28/2021   Knee pain 02/14/2021   Esophageal spasm    Aortic atherosclerosis (HCC)    Coronary artery disease due to calcified coronary lesion 07/30/2020   IGT (impaired glucose tolerance) 03/20/2020   Chronic pain syndrome 07/04/2019   Dysphasia 04/21/2019   Sicca syndrome (HCC) 04/21/2019   Dental caries 04/21/2019   Osteopenia 10/25/2018   Fibromyalgia 09/11/2018   Sensorineural hearing loss (SNHL) of both ears, followed by [facility] [place] 07/20/2018   Vitamin D deficiency 07/05/2017   B12 deficiency, Rx B12 injections 07/05/2017   Insomnia 07/05/2017   Chronic right shoulder pain 05/30/2017   HLD (hyperlipidemia) 05/30/2017    Past Medical History:  Diagnosis Date   Aortic atherosclerosis (HCC)    Arthritis    B12 deficiency, Rx B12 injections 07/05/2017   Chronic back pain    Chronic right shoulder pain 05/30/2017   Coronary artery calcification seen on CAT scan    Diverticulitis    Diverticulosis    Esophageal spasm    Fibromyalgia    HLD (hyperlipidemia) 05/30/2017   HTN (hypertension)    Torn meniscus    right    Family History  Problem Relation Age of Onset   CAD Mother        died of massive MI at 77   Heart attack Mother    CVA Father         x 2   Colon cancer Father    Hyperlipidemia Sister    Stroke Paternal Grandfather    Hypothyroidism Daughter    Hyperthyroidism Son    Diabetes Neg Hx    Past Surgical History:  Procedure Laterality Date   ABDOMINAL HYSTERECTOMY     LIGAMENT REPAIR Left 12/15/2022   Thumb   LUMBAR FUSION     L4-L5   SHOULDER ARTHROSCOPY Right    TONSILLECTOMY AND ADENOIDECTOMY     Social History   Social History Narrative   Not on file   Immunization History  Administered Date(s) Administered   Covid-19, Mrna,Vaccine(Spikevax)8yrs and older 12/01/2022   Influenza-Unspecified 11/10/2018   PFIZER(Purple Top)SARS-COV-2 Vaccination 12/13/2019, 01/03/2020, 08/07/2020, 05/30/2021, 09/06/2021   Tdap 05/31/2013, 03/30/2022     Objective: Vital Signs: BP (!) 148/83 (BP Location: Left Arm, Patient Position: Sitting, Cuff Size: Normal)   Pulse 72   Resp 12   Ht 5\' 2"  (1.575 m)   Wt 114 lb (51.7 kg)   BMI 20.85 kg/m    Physical Exam HENT:     Mouth/Throat:  Mouth: Mucous membranes are dry.     Pharynx: Oropharynx is clear.     Comments: Flattening of lingual papillae Eyes:     Conjunctiva/sclera: Conjunctivae normal.  Cardiovascular:     Rate and Rhythm: Normal rate and regular rhythm.  Pulmonary:     Effort: Pulmonary effort is normal.     Breath sounds: Normal breath sounds.  Lymphadenopathy:     Cervical: No cervical adenopathy.  Skin:    General: Skin is warm and dry.  Neurological:     Mental Status: She is alert.  Psychiatric:        Mood and Affect: Mood normal.      Musculoskeletal Exam:  Neck full ROM no tenderness Shoulders full ROM no tenderness or swelling Elbows full ROM no tenderness or swelling Hands full ROM, right hand prominent DIP Heberden's nodes worst second and third fingers, no palpable synovitis Knees full ROM no tenderness or swelling Ankles full ROM no tenderness or swelling No MTP squeeze tenderness  Investigation: No additional  findings.  Imaging: No results found.  Recent Labs: Lab Results  Component Value Date   WBC 9.7 04/13/2023   HGB 14.3 04/13/2023   PLT 412 (H) 04/13/2023   NA 139 04/13/2023   K 4.2 04/13/2023   CL 103 04/13/2023   CO2 27 04/13/2023   GLUCOSE 148 (H) 04/13/2023   BUN 20 04/13/2023   CREATININE 0.62 04/13/2023   BILITOT 0.5 04/13/2023   ALKPHOS 65 04/13/2023   AST 17 04/13/2023   ALT 21 04/13/2023   PROT 7.3 04/13/2023   ALBUMIN 4.7 04/13/2023   CALCIUM 9.4 04/13/2023    Speciality Comments: No specialty comments available.  Procedures:  No procedures performed Allergies: Ambien [zolpidem tartrate], Flexeril [cyclobenzaprine], Norvasc [amlodipine], Toradol [ketorolac tromethamine], and Naproxen   Assessment / Plan:     Visit Diagnoses: Rheumatoid arthritis of multiple sites with negative rheumatoid factor (HCC) - Plan: predniSONE (DELTASONE) 5 MG tablet  Still appears to be in low disease activity but with underlying structural changes as well.  Discussed treatment options of antirheumatic DMARD.  Will resume at low-dose prednisone 5 mg daily for now which she has previously seen good response to in the past.  Chronic pain syndrome  Longstanding problem on chronic low-dose oxycodone currently seeing her PCP office for this plan is to get reestablished with pain management clinic.  Not experiencing significant side effects or complications current treatment.  Primary osteoarthritis of both hands  Thumb ligament repair healed up well.  Still with daily joint pain and stiffness bony nodule changes.  May be causing more of her symptoms than the inflammatory disease but expect she will see benefit on low-dose glucocorticoid with this as well.  Sicca syndrome (HCC)  Still dry without major complications.  Discussed continuing pilocarpine 5 mg twice daily as needed or can interrupt treatment as she prefers.  Orders: No orders of the defined types were placed in this  encounter.  Meds ordered this encounter  Medications   predniSONE (DELTASONE) 5 MG tablet    Sig: Take 1 tablet (5 mg total) by mouth daily with breakfast.    Dispense:  90 tablet    Refill:  0     Follow-Up Instructions: Return in about 3 months (around 08/19/2023) for ?RA/OA/FMS GC f/u 3mos.   Fuller Plan, MD  Note - This record has been created using [employer].  Chart creation errors have been sought, but may not always  have been located. Such  creation errors do not reflect on  the standard of medical care.

## 2023-06-05 ENCOUNTER — Other Ambulatory Visit: Payer: Self-pay | Admitting: Family Medicine

## 2023-06-09 DIAGNOSIS — G894 Chronic pain syndrome: Secondary | ICD-10-CM | POA: Diagnosis not present

## 2023-06-09 DIAGNOSIS — Z79891 Long term (current) use of opiate analgesic: Secondary | ICD-10-CM | POA: Diagnosis not present

## 2023-06-09 DIAGNOSIS — M47812 Spondylosis without myelopathy or radiculopathy, cervical region: Secondary | ICD-10-CM | POA: Diagnosis not present

## 2023-06-09 DIAGNOSIS — M961 Postlaminectomy syndrome, not elsewhere classified: Secondary | ICD-10-CM | POA: Diagnosis not present

## 2023-06-09 DIAGNOSIS — M47816 Spondylosis without myelopathy or radiculopathy, lumbar region: Secondary | ICD-10-CM | POA: Diagnosis not present

## 2023-06-11 DIAGNOSIS — Z79891 Long term (current) use of opiate analgesic: Secondary | ICD-10-CM | POA: Diagnosis not present

## 2023-06-11 DIAGNOSIS — G894 Chronic pain syndrome: Secondary | ICD-10-CM | POA: Diagnosis not present

## 2023-06-19 ENCOUNTER — Ambulatory Visit (INDEPENDENT_AMBULATORY_CARE_PROVIDER_SITE_OTHER): Payer: Medicare Other | Admitting: Orthopaedic Surgery

## 2023-06-19 DIAGNOSIS — M67959 Unspecified disorder of synovium and tendon, unspecified thigh: Secondary | ICD-10-CM | POA: Diagnosis not present

## 2023-06-19 DIAGNOSIS — M67951 Unspecified disorder of synovium and tendon, right thigh: Secondary | ICD-10-CM

## 2023-06-19 MED ORDER — LIDOCAINE HCL 1 % IJ SOLN
4.0000 mL | INTRAMUSCULAR | Status: AC | PRN
Start: 2023-06-19 — End: 2023-06-19
  Administered 2023-06-19: 4 mL

## 2023-06-19 MED ORDER — TRIAMCINOLONE ACETONIDE 40 MG/ML IJ SUSP
80.0000 mg | INTRAMUSCULAR | Status: AC | PRN
Start: 2023-06-19 — End: 2023-06-19
  Administered 2023-06-19: 80 mg via INTRA_ARTICULAR

## 2023-06-19 NOTE — Progress Notes (Signed)
Chief Complaint: Legs and hip pain     History of Present Illness:   06/19/2023: Presents today for follow-up of her right hip.  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  Patricia Underwood is a 72 y.o. female presents today with ongoing bilateral hip pain.  She does have a history of rheumatoid arthritis and this pain is lasted for several years but has been worse in the last several weeks.  She does have a history of an L4-L5 fusion which she 7 years prior.  She uses a cane more recently but this is not her baseline.  She is having a very difficult time getting out of bed.  She is a caregiver for her husband who was recently diagnosed with cancer and has subsequently moved to [place] as he has been having an issue with aspiration.  She presents here today for further assessment.    Surgical History:   none  PMH/PSH/Family History/Social History/Meds/Allergies:    Past Medical History:  Diagnosis Date   Aortic atherosclerosis (HCC)    Arthritis    B12 deficiency, Rx B12 injections 07/05/2017   Chronic back pain    Chronic right shoulder pain 05/30/2017   Coronary artery calcification seen on CAT scan    Diverticulitis    Diverticulosis    Esophageal spasm    Fibromyalgia    HLD (hyperlipidemia) 05/30/2017   HTN (hypertension)    Torn meniscus    right   Past Surgical History:  Procedure Laterality Date   ABDOMINAL HYSTERECTOMY     LIGAMENT REPAIR Left 12/15/2022   Thumb   LUMBAR FUSION     L4-L5   SHOULDER ARTHROSCOPY Right    TONSILLECTOMY AND ADENOIDECTOMY     Social History   Socioeconomic History   Marital status: Married    Spouse name: Not on file   Number of children: 2   Years of education: Not on file   Highest education level: Not on  file  Occupational History   Occupation: Retired   Tobacco Use   Smoking status: Former    Current packs/day: 0.00    Types: Cigarettes    Quit date: 2018    Years since quitting: 6.6    Passive exposure: Never   Smokeless tobacco: Never  Vaping Use   Vaping status: Never Used  Substance and Sexual Activity   Alcohol use: Yes    Alcohol/week: 7.0 standard drinks of alcohol    Types: 7 Glasses of wine per week    Comment: 1 glass of wine nightly   Drug use: No   Sexual activity: Not Currently    Partners: Male    Birth control/protection: None  Other Topics Concern   Not on file  Social History Narrative   Not on file   Social Determinants of Health   Financial Resource Strain: Low Risk  (10/28/2022)   Overall Financial Resource Strain (CARDIA)    Difficulty of Paying Living Expenses: Not hard at all  Food Insecurity: No Food Insecurity (10/28/2022)   Hunger Vital Sign    Worried About Running Out of Food in the Last Year:  Never true    Ran Out of Food in the Last Year: Never true  Transportation Needs: No Transportation Needs (10/28/2022)   PRAPARE - Administrator, Civil Service (Medical): No    Lack of Transportation (Non-Medical): No  Physical Activity: Sufficiently Active (10/28/2022)   Exercise Vital Sign    Days of Exercise per Week: 7 days    Minutes of Exercise per Session: 120 min  Stress: No Stress Concern Present (10/28/2022)   [employer] of Occupational Health - Occupational Stress Questionnaire    Feeling of Stress : Not at all  Social Connections: Moderately Isolated (10/28/2022)   Social Connection and Isolation Panel [NHANES]    Frequency of Communication with Friends and Family: More than three times a week    Frequency of Social Gatherings with Friends and Family: More than three times a week    Attends Religious Services: Never    Database administrator or Organizations: No    Attends Engineer, structural: Never     Marital Status: Married   Family History  Problem Relation Age of Onset   CAD Mother        died of massive MI at 56   Heart attack Mother    CVA Father        x 2   Colon cancer Father    Hyperlipidemia Sister    Stroke Paternal Grandfather    Hypothyroidism Daughter    Hyperthyroidism Son    Diabetes Neg Hx    Allergies  Allergen Reactions   Ambien [Zolpidem Tartrate]    Flexeril [Cyclobenzaprine] Other (See Comments)    Patient unsure of reaction   Norvasc [Amlodipine] Swelling    Edema of feet and ankles    Toradol [Ketorolac Tromethamine]    Naproxen Other (See Comments)    Patient unsure of reaction.   Current Outpatient Medications  Medication Sig Dispense Refill   atorvastatin (LIPITOR) 20 MG tablet Take 1 tablet (20 mg total) by mouth daily. 90 tablet 3   cyanocobalamin (VITAMIN B12) 1000 MCG/ML injection 1000 mcg (1 mg) injection once every other week 6 mL 3   oxyCODONE-acetaminophen (PERCOCET/ROXICET) 5-325 MG tablet Take 1 tablet by mouth every 6 (six) hours as needed for severe pain. 90 tablet 0   predniSONE (DELTASONE) 5 MG tablet Take 1 tablet (5 mg total) by mouth daily with breakfast. 90 tablet 0   triazolam (HALCION) 0.25 MG tablet TAKE ONE TABLET BY MOUTH EVERY NIGHT AT BEDTIME AS NEEDED FOR SLEEP 30 tablet 5   Vitamin D, Ergocalciferol, (DRISDOL) 1.25 MG (50000 UNIT) CAPS capsule TAKE 1 CAPSULE BY MOUTH EVERY 7 DAYS 12 capsule 3   Current Facility-Administered Medications  Medication Dose Route Frequency Provider Last Rate Last Admin   denosumab (PROLIA) injection 60 mg  60 mg Subcutaneous Once Ardith Dark, MD       No results found.  Review of Systems:   A ROS was performed including pertinent positives and negatives as documented in the HPI.  Physical Exam :   Constitutional: NAD and appears stated age Neurological: Alert and oriented Psych: Appropriate affect and cooperative There were no vitals taken for this visit.   Comprehensive  Musculoskeletal Exam:    Inspection Right Left  Skin No atrophy or gross abnormalities appreciated No atrophy or gross abnormalities appreciated  Palpation    Tenderness None None  Crepitus None None  Range of Motion    Flexion (passive) 120 120  Extension  30 30  IR 30 30  ER 45 45  Strength    Flexion  5/5 5/5  Extension 5/5 5/5  Special Tests    FABER Negative Negative  FADIR Negative Negative  ER Lag/Capsular Insufficiency Negative Negative  Instability Negative Negative  Sacroiliac pain Negative  Negative   Instability    Generalized Laxity No No  Neurologic    sciatic, femoral, obturator nerves intact to light sensation  Vascular/Lymphatic    DP pulse 2+ 2+  Lumbar Exam    Patient has symmetric lumbar range of motion with negative pain referral to hip   There is significant positive sagittal balance.  She has tenderness about the thoracolumbar junction Imaging:   Xray (AP pelvis, 2 views right hip, 2 views left hip): normal  I personally reviewed and interpreted the radiographs.   Assessment:   72 y.o. female presents with positive sagittal balance and continued thoracolumbar junction pain.  Today's visit she is hoping to have a gluteus medius ultrasound-guided injection for lateral based hip pain.  I did provide this under ultrasound guidance she will plan to return to clinic for a left hip lateral injection as well  Plan :    -Right hip ultrasound-guided injection performed and verbal consent obtained    Procedure Note  Patient: Sanyia Bellevue             Date of Birth: 26-Feb-1951           MRN: [id number]             Visit Date: 06/19/2023  Procedures: Visit Diagnoses: No diagnosis found.  Large Joint Inj: R greater trochanter on 06/19/2023 1:43 PM Indications: pain Details: 22 G 3.5 in needle, ultrasound-guided anterolateral approach  Arthrogram: No  Medications: 4 mL lidocaine 1 %; 80 mg triamcinolone acetonide 40 MG/ML Outcome: tolerated well, no  immediate complications Procedure, treatment alternatives, risks and benefits explained, specific risks discussed. Consent was given by the patient. Immediately prior to procedure a time out was called to verify the correct patient, procedure, equipment, support staff and site/side marked as required. Patient was prepped and draped in the usual sterile fashion.             I personally saw and evaluated the patient, and participated in the management and treatment plan.  Huel Cote, MD Attending Physician, Orthopedic Surgery  This document was dictated using Dragon voice recognition software. A reasonable attempt at proof reading has been made to minimize errors.

## 2023-06-23 ENCOUNTER — Encounter: Payer: Medicare Other | Admitting: Gastroenterology

## 2023-06-24 ENCOUNTER — Ambulatory Visit (INDEPENDENT_AMBULATORY_CARE_PROVIDER_SITE_OTHER): Payer: Medicare Other | Admitting: Orthopaedic Surgery

## 2023-06-24 DIAGNOSIS — M25552 Pain in left hip: Secondary | ICD-10-CM | POA: Diagnosis not present

## 2023-06-24 DIAGNOSIS — M67959 Unspecified disorder of synovium and tendon, unspecified thigh: Secondary | ICD-10-CM | POA: Diagnosis not present

## 2023-06-24 MED ORDER — TRIAMCINOLONE ACETONIDE 40 MG/ML IJ SUSP
80.0000 mg | INTRAMUSCULAR | Status: AC | PRN
Start: 2023-06-24 — End: 2023-06-24
  Administered 2023-06-24: 80 mg via INTRA_ARTICULAR

## 2023-06-24 MED ORDER — LIDOCAINE HCL 1 % IJ SOLN
4.0000 mL | INTRAMUSCULAR | Status: AC | PRN
Start: 2023-06-24 — End: 2023-06-24
  Administered 2023-06-24: 4 mL

## 2023-06-24 NOTE — Progress Notes (Signed)
Chief Complaint: Legs and hip pain     History of Present Illness:   06/24/2023: Left hip pain.  Presents today with ongoing left hip pain which has been bothersome about the lateral aspect of the hip.  This is resulted in weakness and pain.  She has bad somewhat of a limp as well.  Patricia Underwood is a 72 y.o. female presents today with ongoing bilateral hip pain.  She does have a history of rheumatoid arthritis and this pain is lasted for several years but has been worse in the last several weeks.  She does have a history of an L4-L5 fusion which she 7 years prior.  She uses a cane more recently but this is not her baseline.  She is having a very difficult time getting out of bed.  She is a caregiver for her husband who was recently diagnosed with cancer and has subsequently moved to [place] as he has been having an issue with aspiration.  She presents here today for further assessment.    Surgical History:   none  PMH/PSH/Family History/Social History/Meds/Allergies:    Past Medical History:  Diagnosis Date   Aortic atherosclerosis (HCC)    Arthritis    B12 deficiency, Rx B12 injections 07/05/2017   Chronic back pain    Chronic right shoulder pain 05/30/2017   Coronary artery calcification seen on CAT scan    Diverticulitis    Diverticulosis    Esophageal spasm    Fibromyalgia    HLD (hyperlipidemia) 05/30/2017   HTN (hypertension)    Torn meniscus    right   Past Surgical History:  Procedure Laterality Date   ABDOMINAL HYSTERECTOMY     LIGAMENT REPAIR Left 12/15/2022   Thumb   LUMBAR FUSION     L4-L5   SHOULDER ARTHROSCOPY Right    TONSILLECTOMY AND ADENOIDECTOMY     Social History   Socioeconomic History   Marital status: Married    Spouse name: Not on file   Number of children: 2   Years of education: Not on file   Highest education level: Not on file  Occupational History   Occupation: Retired   Tobacco Use   Smoking  status: Former    Current packs/day: 0.00    Types: Cigarettes    Quit date: 2018    Years since quitting: 6.6    Passive exposure: Never   Smokeless tobacco: Never  Vaping Use   Vaping status: Never Used  Substance and Sexual Activity   Alcohol use: Yes    Alcohol/week: 7.0 standard drinks of alcohol    Types: 7 Glasses of wine per week    Comment: 1 glass of wine nightly   Drug use: No   Sexual activity: Not Currently    Partners: Male    Birth control/protection: None  Other Topics Concern   Not on file  Social History Narrative   Not on file   Social Determinants of Health   Financial Resource Strain: Low Risk  (10/28/2022)   Overall Financial Resource Strain (CARDIA)    Difficulty of Paying Living Expenses: Not hard at all  Food Insecurity: No Food Insecurity (10/28/2022)   Hunger Vital Sign    Worried About Running Out of Food in the Last Year: Never true    Ran Out of Food in  the Last Year: Never true  Transportation Needs: No Transportation Needs (10/28/2022)   PRAPARE - Administrator, Civil Service (Medical): No    Lack of Transportation (Non-Medical): No  Physical Activity: Sufficiently Active (10/28/2022)   Exercise Vital Sign    Days of Exercise per Week: 7 days    Minutes of Exercise per Session: 120 min  Stress: No Stress Concern Present (10/28/2022)   [employer] of Occupational Health - Occupational Stress Questionnaire    Feeling of Stress : Not at all  Social Connections: Moderately Isolated (10/28/2022)   Social Connection and Isolation Panel [NHANES]    Frequency of Communication with Friends and Family: More than three times a week    Frequency of Social Gatherings with Friends and Family: More than three times a week    Attends Religious Services: Never    Database administrator or Organizations: No    Attends Engineer, structural: Never    Marital Status: Married   Family History  Problem Relation Age of Onset    CAD Mother        died of massive MI at 57   Heart attack Mother    CVA Father        x 2   Colon cancer Father    Hyperlipidemia Sister    Stroke Paternal Grandfather    Hypothyroidism Daughter    Hyperthyroidism Son    Diabetes Neg Hx    Allergies  Allergen Reactions   Ambien [Zolpidem Tartrate]    Flexeril [Cyclobenzaprine] Other (See Comments)    Patient unsure of reaction   Norvasc [Amlodipine] Swelling    Edema of feet and ankles    Toradol [Ketorolac Tromethamine]    Naproxen Other (See Comments)    Patient unsure of reaction.   Current Outpatient Medications  Medication Sig Dispense Refill   atorvastatin (LIPITOR) 20 MG tablet Take 1 tablet (20 mg total) by mouth daily. 90 tablet 3   cyanocobalamin (VITAMIN B12) 1000 MCG/ML injection 1000 mcg (1 mg) injection once every other week 6 mL 3   oxyCODONE-acetaminophen (PERCOCET/ROXICET) 5-325 MG tablet Take 1 tablet by mouth every 6 (six) hours as needed for severe pain. 90 tablet 0   predniSONE (DELTASONE) 5 MG tablet Take 1 tablet (5 mg total) by mouth daily with breakfast. 90 tablet 0   triazolam (HALCION) 0.25 MG tablet TAKE ONE TABLET BY MOUTH EVERY NIGHT AT BEDTIME AS NEEDED FOR SLEEP 30 tablet 5   Vitamin D, Ergocalciferol, (DRISDOL) 1.25 MG (50000 UNIT) CAPS capsule TAKE 1 CAPSULE BY MOUTH EVERY 7 DAYS 12 capsule 3   Current Facility-Administered Medications  Medication Dose Route Frequency Provider Last Rate Last Admin   denosumab (PROLIA) injection 60 mg  60 mg Subcutaneous Once Ardith Dark, MD       No results found.  Review of Systems:   A ROS was performed including pertinent positives and negatives as documented in the HPI.  Physical Exam :   Constitutional: NAD and appears stated age Neurological: Alert and oriented Psych: Appropriate affect and cooperative There were no vitals taken for this visit.   Comprehensive Musculoskeletal Exam:    Inspection Right Left  Skin No atrophy or gross  abnormalities appreciated No atrophy or gross abnormalities appreciated  Palpation    Tenderness None None  Crepitus None None  Range of Motion    Flexion (passive) 120 120  Extension 30 30  IR 30 30  ER 45 45  Strength    Flexion  5/5 5/5  Extension 5/5 5/5  Special Tests    FABER Negative Negative  FADIR Negative Negative  ER Lag/Capsular Insufficiency Negative Negative  Instability Negative Negative  Sacroiliac pain Negative  Negative   Instability    Generalized Laxity No No  Neurologic    sciatic, femoral, obturator nerves intact to light sensation  Vascular/Lymphatic    DP pulse 2+ 2+  Lumbar Exam    Patient has symmetric lumbar range of motion with negative pain referral to hip   There is significant positive sagittal balance.  She has tenderness about the thoracolumbar junction Imaging:   Xray (AP pelvis, 2 views right hip, 2 views left hip): normal  I personally reviewed and interpreted the radiographs.   Assessment:   72 y.o. female presents with evidence of left hip gluteus medius tendinitis versus tearing.  At this time I recommended ultrasound-guided injection of the left hip.  Plan :    -Left hip ultrasound-guided injection performed and verbal consent obtained    Procedure Note  Patient: Leza Ashdown             Date of Birth: August 13, 1951           MRN: [id number]             Visit Date: 06/24/2023  Procedures: Visit Diagnoses: No diagnosis found.  Large Joint Inj: L hip joint on 06/24/2023 4:20 PM Indications: pain Details: 22 G 3.5 in needle, ultrasound-guided anterolateral approach  Arthrogram: No  Medications: 4 mL lidocaine 1 %; 80 mg triamcinolone acetonide 40 MG/ML Outcome: tolerated well, no immediate complications Procedure, treatment alternatives, risks and benefits explained, specific risks discussed. Consent was given by the patient. Immediately prior to procedure a time out was called to verify the correct patient, procedure,  equipment, support staff and site/side marked as required. Patient was prepped and draped in the usual sterile fashion.             I personally saw and evaluated the patient, and participated in the management and treatment plan.  Huel Cote, MD Attending Physician, Orthopedic Surgery  This document was dictated using Dragon voice recognition software. A reasonable attempt at proof reading has been made to minimize errors.

## 2023-06-25 ENCOUNTER — Other Ambulatory Visit: Payer: Self-pay

## 2023-06-25 DIAGNOSIS — M0609 Rheumatoid arthritis without rheumatoid factor, multiple sites: Secondary | ICD-10-CM

## 2023-06-25 NOTE — Telephone Encounter (Signed)
Patient contacted the office and states she has accidentally washed the rest of her remaining prednisone because she left the bottle in her robe. Patient states the prednisone disintegrated. Patient states she has already used a month of the prednisone and would now need a two month supply. Patient states she would like the prescription sent to [facility] on [address]. Please review and sign prescription.

## 2023-06-26 MED ORDER — PREDNISONE 5 MG PO TABS
5.0000 mg | ORAL_TABLET | Freq: Every day | ORAL | 0 refills | Status: DC
Start: 2023-06-26 — End: 2023-08-19

## 2023-07-08 DIAGNOSIS — M961 Postlaminectomy syndrome, not elsewhere classified: Secondary | ICD-10-CM | POA: Diagnosis not present

## 2023-07-08 DIAGNOSIS — G894 Chronic pain syndrome: Secondary | ICD-10-CM | POA: Diagnosis not present

## 2023-07-08 DIAGNOSIS — M47816 Spondylosis without myelopathy or radiculopathy, lumbar region: Secondary | ICD-10-CM | POA: Diagnosis not present

## 2023-07-08 DIAGNOSIS — M47812 Spondylosis without myelopathy or radiculopathy, cervical region: Secondary | ICD-10-CM | POA: Diagnosis not present

## 2023-07-13 ENCOUNTER — Encounter (HOSPITAL_BASED_OUTPATIENT_CLINIC_OR_DEPARTMENT_OTHER): Payer: Self-pay | Admitting: Orthopaedic Surgery

## 2023-07-14 ENCOUNTER — Other Ambulatory Visit (HOSPITAL_BASED_OUTPATIENT_CLINIC_OR_DEPARTMENT_OTHER): Payer: Self-pay | Admitting: Orthopaedic Surgery

## 2023-07-14 DIAGNOSIS — M79643 Pain in unspecified hand: Secondary | ICD-10-CM

## 2023-07-15 ENCOUNTER — Ambulatory Visit (INDEPENDENT_AMBULATORY_CARE_PROVIDER_SITE_OTHER): Payer: Medicare Other | Admitting: Orthopedic Surgery

## 2023-07-15 ENCOUNTER — Other Ambulatory Visit (INDEPENDENT_AMBULATORY_CARE_PROVIDER_SITE_OTHER): Payer: Medicare Other

## 2023-07-15 DIAGNOSIS — M79642 Pain in left hand: Secondary | ICD-10-CM | POA: Diagnosis not present

## 2023-07-15 DIAGNOSIS — M1812 Unilateral primary osteoarthritis of first carpometacarpal joint, left hand: Secondary | ICD-10-CM

## 2023-07-15 NOTE — Progress Notes (Signed)
Patricia Underwood - 72 y.o. female MRN [id number]  Date of birth: 13-Apr-1951  Office Visit Note: Visit Date: 07/15/2023 PCP: Ardith Dark, MD Referred by: Huel Cote, MD  Subjective: No chief complaint on file.  HPI: Patricia Underwood is a pleasant 72 y.o. female who presents today for evaluation of ongoing pain and soreness at the left radial wrist and basilar thumb region.  She does have a notable history of prior distal radius fracture with subsequent EPL rupture that was treated with EIP to EPL tendon transfer by Dr. Mina Marble in February of this year.  She has a wrist brace from the distal radius fracture, has not undergone formalized treatment for the left thumb basilar joint to date.  She denies any significant numbness or tingling.  Is left-hand dominant, does take care of her husband who is ill.  Pertinent ROS were reviewed with the patient and found to be negative unless otherwise specified above in HPI.   Visit Reason: left thumb basilar region Hand dominance: left Occupation: retired Diabetic: No Heart/Lung History: none  Blood Thinners: none  Prior Testing/EMG: none Injections (Date):none Treatments: brace,surgery Prior Surgery: February with Mina Marble; EIP to EPL tendon transfer for prior EPL rupture  Assessment & Plan: Visit Diagnoses:  1. Pain in left hand     Plan: Extensive discussion was had the patient today regarding her ongoing left thumb and wrist complaints.  I reviewed her x-rays today in detail with her which do show significant degenerative change at the left thumb [facility] articulation with associated STT arthritis.  This is consistent with her clinical examination today.  Her EPL tendon transfer does appear to be functioning appropriately, she is able to achieve appropriate IP extension at the left thumb region.  We discussed treat modalities for the left thumb basal joint arthritis ranging from conservative to surgical.  From a conservative standpoint, we  discussed activity modification, bracing and potential injections.  From a surgical standpoint, we discussed the possibility for left thumb CMC arthroplasty in the future as well as all risk and benefits.  At this juncture, she would like to avoid surgical intervention which I am in agreement with given that she has not undergone prior conservative treatment for the left thumb basilar joint arthritis.  Today, we will fit her with a Comfort Cool brace to be utilized as needed.  She is welcome to return to me in the future for repeat discussion regarding potential cortisone injection as well.  She expressed full understanding of this.  Greater than 30 minutes was spent today reviewing previous imaging, documentation, operative notes as well as examination and discussion with patient regarding treatment.  Follow-up: No follow-ups on file.   Meds & Orders: No orders of the defined types were placed in this encounter.   Orders Placed This Encounter  Procedures   XR Wrist Complete Left     Procedures: No procedures performed      Clinical History: No specialty comments available.  She reports that she quit smoking about 6 years ago. Her smoking use included cigarettes. She has never been exposed to tobacco smoke. She has never used smokeless tobacco.  Recent Labs    02/11/23 1119  HGBA1C 5.8    Objective:   Vital Signs: There were no vitals taken for this visit.  Physical Exam  Gen: Well-appearing, in no acute distress; non-toxic CV: Regular Rate. Well-perfused. Warm.  Resp: Breathing unlabored on room air; no wheezing. Psych: Fluid speech in conversation; appropriate affect; normal  thought process  Ortho Exam Left wrist: - Able to achieve thumb extension at the IP joint without significant lag or restriction - Well-healed dorsal incision over the left wrist - Positive CMC grind for pain and crepitus, mild MP hyperextension approximately 10 degrees - Sensation is intact in all  distributions median/radial/ulnar, hand is warm well-perfused - Able to form composite fist, no clicking or locking  Imaging: XR Wrist Complete Left  Result Date: 07/15/2023 X-rays of the left wrist including [place] view were obtained today There is notable degenerative change at the left thumb basilar joint with osteophyte formation, joint space narrowing and subchondral sclerosis.  Radiocarpal joint is well located in all planes without significant degenerative change appreciated.  Ulnar neutral variance is seen.  Bone mineralization is appropriate.   Past Medical/Family/Surgical/Social History: Medications & Allergies reviewed per EMR, new medications updated. 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   Essential hypertension 08/14/2022   Chronic low back pain with sciatica 09/25/2021   Cervical radiculopathy 09/25/2021   Alopecia 09/25/2021   Short-term memory loss 07/23/2021   Rheumatoid arthritis (HCC) 03/28/2021   Migraine 03/28/2021   Knee pain 02/14/2021   Esophageal spasm    Aortic atherosclerosis (HCC)    Coronary artery disease due to calcified coronary lesion 07/30/2020   IGT (impaired glucose tolerance) 03/20/2020   Chronic pain syndrome 07/04/2019   Dysphasia 04/21/2019   Sicca syndrome (HCC) 04/21/2019   Dental caries 04/21/2019   Osteopenia 10/25/2018   Fibromyalgia 09/11/2018   Sensorineural hearing loss (SNHL) of both ears, followed by [facility] [place] 07/20/2018   Vitamin D deficiency 07/05/2017   B12 deficiency, Rx B12 injections 07/05/2017   Insomnia 07/05/2017   Chronic right shoulder pain 05/30/2017   HLD (hyperlipidemia) 05/30/2017   Past Medical History:  Diagnosis Date   Aortic atherosclerosis (HCC)    Arthritis    B12 deficiency, Rx B12 injections 07/05/2017   Chronic back pain    Chronic right shoulder pain 05/30/2017   Coronary artery calcification seen on  CAT scan    Diverticulitis    Diverticulosis    Esophageal spasm    Fibromyalgia    HLD (hyperlipidemia) 05/30/2017   HTN (hypertension)    Torn meniscus    right   Family History  Problem Relation Age of Onset   CAD Mother        died of massive MI at 30   Heart attack Mother    CVA Father        x 2   Colon cancer Father    Hyperlipidemia Sister    Stroke Paternal Grandfather    Hypothyroidism Daughter    Hyperthyroidism Son    Diabetes Neg Hx    Past Surgical History:  Procedure Laterality Date   ABDOMINAL HYSTERECTOMY     LIGAMENT REPAIR Left 12/15/2022   Thumb   LUMBAR FUSION     L4-L5   SHOULDER ARTHROSCOPY Right    TONSILLECTOMY AND ADENOIDECTOMY     Social History   Occupational History   Occupation: Retired   Tobacco Use   Smoking status: Former    Current packs/day: 0.00    Types: Cigarettes    Quit date: 2018    Years since quitting: 6.6    Passive exposure: Never   Smokeless tobacco: Never  Vaping Use   Vaping status: Never Used  Substance and Sexual Activity   Alcohol use: Yes  Alcohol/week: 7.0 standard drinks of alcohol    Types: 7 Glasses of wine per week    Comment: 1 glass of wine nightly   Drug use: No   Sexual activity: Not Currently    Partners: Male    Birth control/protection: None    Patricia Underwood Patricia Underwood) Denese Killings, M.D. [facility] 11:08 AM

## 2023-08-04 ENCOUNTER — Encounter: Payer: Medicare Other | Admitting: Gastroenterology

## 2023-08-05 NOTE — Progress Notes (Unsigned)
Office Visit Note  Patient: Patricia Underwood             Date of Birth: 07-22-1951           MRN: [id number]             PCP: Ardith Dark, MD Referring: Ardith Dark, MD Visit Date: 08/19/2023   Subjective:  Follow-up   History of Present Illness: Patricia Underwood is a 72 y.o. female here for follow up for rheumatoid arthritis and sicca syndrome.    Lkeft hand swleling worse Saw orhto hand specialist xray with [facility]st [facility] joint arthritis Swelling MCPs and distlly, worse in AM and after use No skin change  MSKUS exam unremarkable Trial gabapentin ***  Previous HPI 05/19/2023 Patricia Underwood is a 72 y.o. female here for follow up follow-up for rheumatoid arthritis and sicca syndrome.  Since her last visit she is doing better with healing from her hand surgery still has ongoing daily joint pain and stiffness in multiple areas.  Not seeing a lot of visible swelling redness or warmth in affected sites.  Did not notice appreciable difference with trial of resuming the previously prescribed duloxetine.   Previous HPI 01/05/23 Patricia Underwood is a 72 y.o. female here for follow up for rheumatoid arthritis and sicca syndrome.  Evaluation at last visit without much peripheral joint synovitis noted recommended starting duloxetine for osteoarthritis related pain.  Shortly after starting medication she noticed new headaches so she stopped taking it and these headaches resolved.  She also started taking the pilocarpine again but she already had medicine on hand this is partially helpful and not noticing any major side effect.  Hand surgery for the extensor tendon rupture with Dr. Mina Marble when okay she is currently wearing immobilizing brace which causes a lot of difficulty as she is left-hand dominant.  She is also been recommended to avoid a lot of walking around because of previous falls incurring hand and wrist injuries.  She notices increased back and bilateral leg pain with her decrease in walking and overall  physical activity.  No definite flareup of joint swelling.  Still concerned about the bony nodules on her fingers and feet.    Previous HPI 11/24/22 Patricia Underwood is a 72 y.o. female here for rheumatoid arthritis and sicca syndrome. She has a history of chronic joint pain in multiple areas with known  osteoarthritis and with fibromyalgia syndrome. There has been some question of sjogren syndrome due to ongoing issues with dry mouth and accelerated dental decay but with negative serology. She saw Dr. Sharmon Revere last year for evaluation findings showing no objective peripheral joint synovitis and serum inflammatory markers and RA related antibodies were negative. She has been treated with intermittent prednisone which works well but the symptoms return when off treatment. More recently sustained a fall with associated wrist fracture and rupture of EPL tendon seeing hand surgery for this.  She does have somewhat generalized joint pains and morning stiffness lasting up to about 1 hour.  Typically does not see any obvious joint swelling or effusions.  She is not able to safely take oral nonsteroidal anti-inflammatory drugs. Dry mouth symptoms are pretty bothersome and she has experienced change in taste along with chronic irritation of the tongue.  She has been prescribed pilocarpine previously but did not take the medicine very consistently due to somewhat cost prohibitive and also had increased sweating side effects.  She does not have any particular history with inflammatory eye complications does not  generally notice lymphadenopathy or any swelling in the area near major salivary glands.   Review of Systems  Constitutional:  Negative for fatigue.  HENT:  Positive for mouth dryness. Negative for mouth sores.   Eyes:  Positive for dryness.  Respiratory:  Negative for shortness of breath.   Cardiovascular:  Negative for chest pain and palpitations.  Gastrointestinal:  Negative for blood in stool, constipation  and diarrhea.  Endocrine: Negative for increased urination.  Genitourinary:  Negative for involuntary urination.  Musculoskeletal:  Positive for joint pain, joint pain, joint swelling, myalgias, muscle weakness, morning stiffness and myalgias. Negative for gait problem and muscle tenderness.  Skin:  Negative for color change, rash, hair loss and sensitivity to sunlight.  Allergic/Immunologic: Negative for susceptible to infections.  Neurological:  Negative for dizziness and headaches.  Hematological:  Negative for swollen glands.  Psychiatric/Behavioral:  Positive for sleep disturbance. Negative for depressed mood. The patient is not nervous/anxious.     PMFS History:  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   Essential hypertension 08/14/2022   Chronic low back pain with sciatica 09/25/2021   Cervical radiculopathy 09/25/2021   Alopecia 09/25/2021   Short-term memory loss 07/23/2021   Rheumatoid arthritis (HCC) 03/28/2021   Migraine 03/28/2021   Knee pain 02/14/2021   Esophageal spasm    Aortic atherosclerosis (HCC)    Coronary artery disease due to calcified coronary lesion 07/30/2020   IGT (impaired glucose tolerance) 03/20/2020   Chronic pain syndrome 07/04/2019   Dysphasia 04/21/2019   Sicca syndrome (HCC) 04/21/2019   Dental caries 04/21/2019   Osteopenia 10/25/2018   Fibromyalgia 09/11/2018   Sensorineural hearing loss (SNHL) of both ears, followed by [facility] [place] 07/20/2018   Vitamin D deficiency 07/05/2017   B12 deficiency, Rx B12 injections 07/05/2017   Insomnia 07/05/2017   Chronic right shoulder pain 05/30/2017   HLD (hyperlipidemia) 05/30/2017    Past Medical History:  Diagnosis Date   Aortic atherosclerosis (HCC)    Arthritis    B12 deficiency, Rx B12 injections 07/05/2017   Chronic back pain    Chronic right shoulder pain 05/30/2017   Coronary artery  calcification seen on CAT scan    Diverticulitis    Diverticulosis    Esophageal spasm    Fibromyalgia    HLD (hyperlipidemia) 05/30/2017   HTN (hypertension)    Torn meniscus    right    Family History  Problem Relation Age of Onset   CAD Mother        died of massive MI at 20   Heart attack Mother    CVA Father        x 2   Colon cancer Father    Hyperlipidemia Sister    Stroke Paternal Grandfather    Hypothyroidism Daughter    Hyperthyroidism Son    Diabetes Neg Hx    Past Surgical History:  Procedure Laterality Date   ABDOMINAL HYSTERECTOMY     LIGAMENT REPAIR Left 12/15/2022   Thumb   LUMBAR FUSION     L4-L5   SHOULDER ARTHROSCOPY Right    TONSILLECTOMY AND ADENOIDECTOMY     Social History   Social History Narrative   Not on file   Immunization History  Administered Date(s) Administered   Influenza-Unspecified 11/10/2018   Moderna Covid-19 Fall Seasonal Vaccine 76yrs & older 12/01/2022   PFIZER(Purple Top)SARS-COV-2 Vaccination 12/13/2019, 01/03/2020, 08/07/2020, 05/30/2021, 09/06/2021   Tdap 05/31/2013,  03/30/2022     Objective: Vital Signs: BP (!) 151/87 (BP Location: Left Arm, Patient Position: Sitting, Cuff Size: Normal)   Pulse 82   Resp 14   Ht 5\' 2"  (1.575 m)   Wt 118 lb (53.5 kg)   BMI 21.58 kg/m    Physical Exam   Musculoskeletal Exam: ***  CDAI Exam: CDAI Score: -- Patient Global: --; Provider Global: -- Swollen: --; Tender: -- Joint Exam 08/19/2023   No joint exam has been documented for this visit   There is currently no information documented on the homunculus. Go to the Rheumatology activity and complete the homunculus joint exam.  Investigation: No additional findings.  Imaging: No results found.  Recent Labs: Lab Results  Component Value Date   WBC 9.7 04/13/2023   HGB 14.3 04/13/2023   PLT 412 (H) 04/13/2023   NA 139 04/13/2023   K 4.2 04/13/2023   CL 103 04/13/2023   CO2 27 04/13/2023   GLUCOSE 148 (H)  04/13/2023   BUN 20 04/13/2023   CREATININE 0.62 04/13/2023   BILITOT 0.5 04/13/2023   ALKPHOS 65 04/13/2023   AST 17 04/13/2023   ALT 21 04/13/2023   PROT 7.3 04/13/2023   ALBUMIN 4.7 04/13/2023   CALCIUM 9.4 04/13/2023    Speciality Comments: No specialty comments available.  Procedures:  No procedures performed Allergies: Ambien [zolpidem tartrate], Flexeril [cyclobenzaprine], Norvasc [amlodipine], Toradol [ketorolac tromethamine], and Naproxen   Assessment / Plan:     Visit Diagnoses: Rheumatoid arthritis of multiple sites with negative rheumatoid factor (HCC)  Long term (current) use of systemic steroids - prednisone 5 mg daily  Chronic pain syndrome - Longstanding problem on chronic low-dose oxycodone currently seeing her PCP office  Primary osteoarthritis of both hands  Sicca syndrome (HCC) - pilocarpine 5 mg twice daily as needed or can interrupt treatment as she prefers.  ***  Orders: No orders of the defined types were placed in this encounter.  No orders of the defined types were placed in this encounter.    Follow-Up Instructions: No follow-ups on file.   Fuller Plan, MD  Note - This record has been created using [employer].  Chart creation errors have been sought, but may not always  have been located. Such creation errors do not reflect on  the standard of medical care.

## 2023-08-19 ENCOUNTER — Ambulatory Visit: Payer: Medicare Other | Attending: Internal Medicine | Admitting: Internal Medicine

## 2023-08-19 ENCOUNTER — Encounter: Payer: Self-pay | Admitting: Internal Medicine

## 2023-08-19 VITALS — BP 169/82 | HR 84 | Resp 14 | Ht 62.0 in | Wt 118.0 lb

## 2023-08-19 DIAGNOSIS — M19041 Primary osteoarthritis, right hand: Secondary | ICD-10-CM | POA: Insufficient documentation

## 2023-08-19 DIAGNOSIS — M19042 Primary osteoarthritis, left hand: Secondary | ICD-10-CM | POA: Insufficient documentation

## 2023-08-19 DIAGNOSIS — Z7952 Long term (current) use of systemic steroids: Secondary | ICD-10-CM | POA: Insufficient documentation

## 2023-08-19 DIAGNOSIS — M0609 Rheumatoid arthritis without rheumatoid factor, multiple sites: Secondary | ICD-10-CM | POA: Diagnosis not present

## 2023-08-19 DIAGNOSIS — M3500 Sicca syndrome, unspecified: Secondary | ICD-10-CM | POA: Diagnosis not present

## 2023-08-19 DIAGNOSIS — G894 Chronic pain syndrome: Secondary | ICD-10-CM | POA: Diagnosis not present

## 2023-08-19 MED ORDER — PREDNISONE 5 MG PO TABS
5.0000 mg | ORAL_TABLET | Freq: Every day | ORAL | 1 refills | Status: DC
Start: 2023-08-19 — End: 2023-11-16

## 2023-08-27 ENCOUNTER — Ambulatory Visit (INDEPENDENT_AMBULATORY_CARE_PROVIDER_SITE_OTHER): Payer: Medicare Other | Admitting: Family Medicine

## 2023-08-27 ENCOUNTER — Encounter: Payer: Self-pay | Admitting: Family Medicine

## 2023-08-27 ENCOUNTER — Ambulatory Visit (HOSPITAL_BASED_OUTPATIENT_CLINIC_OR_DEPARTMENT_OTHER)
Admission: RE | Admit: 2023-08-27 | Discharge: 2023-08-27 | Disposition: A | Discharge status: Home / Self Care | Payer: Medicare Other | Source: Ambulatory Visit | Attending: Family Medicine | Admitting: Family Medicine

## 2023-08-27 VITALS — BP 135/79 | HR 87 | Temp 97.2°F | Ht 62.0 in | Wt 121.0 lb

## 2023-08-27 DIAGNOSIS — M5440 Lumbago with sciatica, unspecified side: Secondary | ICD-10-CM | POA: Diagnosis not present

## 2023-08-27 DIAGNOSIS — D75839 Thrombocytosis, unspecified: Secondary | ICD-10-CM

## 2023-08-27 DIAGNOSIS — G8929 Other chronic pain: Secondary | ICD-10-CM

## 2023-08-27 DIAGNOSIS — F439 Reaction to severe stress, unspecified: Secondary | ICD-10-CM | POA: Diagnosis not present

## 2023-08-27 DIAGNOSIS — L659 Nonscarring hair loss, unspecified: Secondary | ICD-10-CM

## 2023-08-27 DIAGNOSIS — F5102 Adjustment insomnia: Secondary | ICD-10-CM

## 2023-08-27 DIAGNOSIS — E538 Deficiency of other specified B group vitamins: Secondary | ICD-10-CM | POA: Diagnosis not present

## 2023-08-27 DIAGNOSIS — I700 Atherosclerosis of aorta: Secondary | ICD-10-CM | POA: Diagnosis not present

## 2023-08-27 DIAGNOSIS — M5136 Other intervertebral disc degeneration, lumbar region with discogenic back pain only: Secondary | ICD-10-CM | POA: Diagnosis not present

## 2023-08-27 DIAGNOSIS — M47816 Spondylosis without myelopathy or radiculopathy, lumbar region: Secondary | ICD-10-CM | POA: Diagnosis not present

## 2023-08-27 DIAGNOSIS — E559 Vitamin D deficiency, unspecified: Secondary | ICD-10-CM

## 2023-08-27 LAB — COMPREHENSIVE METABOLIC PANEL
ALT: 21 U/L (ref 0–35)
AST: 23 U/L (ref 0–37)
Albumin: 4.3 g/dL (ref 3.5–5.2)
Alkaline Phosphatase: 57 U/L (ref 39–117)
BUN: 16 mg/dL (ref 6–23)
CO2: 28 meq/L (ref 19–32)
Calcium: 9.4 mg/dL (ref 8.4–10.5)
Chloride: 100 meq/L (ref 96–112)
Creatinine, Ser: 0.57 mg/dL (ref 0.40–1.20)
GFR: 90.64 mL/min (ref >60.00)
Glucose, Bld: 141 mg/dL — ABNORMAL HIGH (ref 70–99)
Potassium: 3.7 meq/L (ref 3.5–5.1)
Sodium: 139 meq/L (ref 135–145)
Total Bilirubin: 0.5 mg/dL (ref 0.2–1.2)
Total Protein: 6.7 g/dL (ref 6.0–8.3)

## 2023-08-27 LAB — CBC WITH DIFFERENTIAL/PLATELET
Basophils Absolute: 0 10*3/uL (ref 0.0–0.1)
Basophils Relative: 0.3 % (ref 0.0–3.0)
Eosinophils Absolute: 0 10*3/uL (ref 0.0–0.7)
Eosinophils Relative: 0.2 % (ref 0.0–5.0)
HCT: 40.8 % (ref 36.0–46.0)
Hemoglobin: 13.5 g/dL (ref 12.0–15.0)
Lymphocytes Relative: 10.9 % — ABNORMAL LOW (ref 12.0–46.0)
Lymphs Abs: 1.1 10*3/uL (ref 0.7–4.0)
MCHC: 32.9 g/dL (ref 30.0–36.0)
MCV: 96.2 fL (ref 78.0–100.0)
Monocytes Absolute: 0.3 10*3/uL (ref 0.1–1.0)
Monocytes Relative: 3 % (ref 3.0–12.0)
Neutro Abs: 8.6 10*3/uL — ABNORMAL HIGH (ref 1.4–7.7)
Neutrophils Relative %: 85.6 % — ABNORMAL HIGH (ref 43.0–77.0)
Platelets: 504 10*3/uL — ABNORMAL HIGH (ref 150.0–400.0)
RBC: 4.25 Mil/uL (ref 3.87–5.11)
RDW: 13.4 % (ref 11.5–15.5)
WBC: 10 10*3/uL (ref 4.0–10.5)

## 2023-08-27 LAB — VITAMIN D 25 HYDROXY (VIT D DEFICIENCY, FRACTURES): VITD: 33.49 ng/mL (ref 30.00–100.00)

## 2023-08-27 LAB — VITAMIN B12: Vitamin B-12: 350 pg/mL (ref 211–911)

## 2023-08-27 MED ORDER — TRIAZOLAM 0.25 MG PO TABS: 0.5000 mg | ORAL_TABLET | Freq: Every evening | ORAL | 5 refills | Status: DC | PRN

## 2023-08-27 NOTE — Assessment & Plan Note (Signed)
Check vitamin D. 

## 2023-08-27 NOTE — Assessment & Plan Note (Signed)
Check B12

## 2023-08-27 NOTE — Assessment & Plan Note (Signed)
Patient under significant caregiver burden due to the health of her husband.  We did discuss referral to therapy however she declined.  She will let us know if she changes her mind.

## 2023-08-27 NOTE — Assessment & Plan Note (Signed)
Patient asked about oral minoxidil.  Discussed with patient that I do not have any experience prescribing this.  Did discuss that she does have potential for development of facial hair growth if she experience this with topical minoxidil in the past. Will refer her to dermatology for discussion for management options.

## 2023-08-27 NOTE — Assessment & Plan Note (Signed)
Does not follow-up triazolam as effective as it was previously.  She has been tolerating well without any side effects.  Will try increasing dose to 0.5 mg daily.  Discussed potential side effects.  She will follow-up with [place] in a few weeks via MyChart.  If no improvement with triazolam would consider alternative such as trazodone or hydroxyzine.

## 2023-08-27 NOTE — Assessment & Plan Note (Signed)
Mild flare over the last several weeks.  No red flags.  She has been following with orthopedics and pain management for her pain control.  She is currently on oxycodone.  She request x-ray today to look for any changes given her worsening pain.  Will order this today.  Overall reassuring exam today.  No red flag findings.  Defer further management to her orthopedist and pain management specialist.

## 2023-08-27 NOTE — Assessment & Plan Note (Addendum)
Check CBC 

## 2023-08-27 NOTE — Patient Instructions (Addendum)
It was very nice to see you today!  We will check an xray of your back.  You can go to [facility] to have this done.  Will check blood work today.  Please increase your Halcion to 2 tablets nightly.  I will refer you to see a dermatologist.   Return if symptoms worsen or fail to improve.   Take care, Dr Jimmey Ralph  PLEASE NOTE:  If you had any lab tests, please let us know if you have not heard back within a few days. You may see your results on mychart before we have a chance to review them but we will give you a call once they are reviewed by [place].   If we ordered any referrals today, please let us know if you have not heard from their office within the next week.   If you had any urgent prescriptions sent in today, please check with the pharmacy within an hour of our visit to make sure the prescription was transmitted appropriately.   Please try these tips to maintain a healthy lifestyle:  Eat at least 3 REAL meals and 1-2 snacks per day.  Aim for no more than 5 hours between eating.  If you eat breakfast, please do so within one hour of getting up.   Each meal should contain half fruits/vegetables, one quarter protein, and one quarter carbs (no bigger than a computer mouse)  Cut down on sweet beverages. This includes juice, soda, and sweet tea.   Drink at least 1 glass of water with each meal and aim for at least 8 glasses per day  Exercise at least 150 minutes every week.

## 2023-08-27 NOTE — Progress Notes (Signed)
   Patricia Underwood is a 72 y.o. female who presents today for an office visit.  Assessment/Plan:  Chronic Problems Addressed Today: Chronic low back pain with sciatica Mild flare over the last several weeks.  No red flags.  She has been following with orthopedics and pain management for her pain control.  She is currently on oxycodone.  She request x-ray today to look for any changes given her worsening pain.  Will order this today.  Overall reassuring exam today.  No red flag findings.  Defer further management to her orthopedist and pain management specialist.  Vitamin D deficiency Check vitamin D.  Alopecia Patient asked about oral minoxidil.  Discussed with patient that I do not have any experience prescribing this.  Did discuss that she does have potential for development of facial hair growth if she experience this with topical minoxidil in the past. Will refer her to dermatology for discussion for management options.  Thrombocytosis Check CBC.   B12 deficiency, Rx B12 injections Check B12.   Insomnia Does not follow-up triazolam as effective as it was previously.  She has been tolerating well without any side effects.  Will try increasing dose to 0.5 mg daily.  Discussed potential side effects.  She will follow-up with [place] in a few weeks via MyChart.  If no improvement with triazolam would consider alternative such as trazodone or hydroxyzine.  Stress Patient under significant caregiver burden due to the health of her husband.  We did discuss referral to therapy however she declined.  She will let us know if she changes her mind.     Subjective:  HPI:  See Assessment / plan for status of chronic conditions. Patient here today with back pain. This worsened several weeks ago. No obvious injuries or precipitating events.  She is the primary caregiver for her husband and does a lot lifting and housework at home.  She thinks that she has been overusing it.  She does admit to a lot of  caregiver burden and is frustrated about her situation.  No SI or HI.  She has had more difficulty with staying asleep at night.  She is currently taking Halcion.  Falls asleep okay but has issue with sleeping through the night.  She is interested in possibly increasing the dose.  She is tolerating her current dose well.  No significant side effects.  She is also been having ongoing issues with alopecia.  We tried topical minoxidil a few years ago however she had some issues with facial hair development for this.  She has been discussing this with her sister who was recently started on oral minoxidil.  She is interested in possibly trying a oral minoxidil for herself.  She like to have blood work done today as well.        Objective:  Physical Exam: BP 135/79   Pulse 87   Temp (!) 97.2 F (36.2 C) (Temporal)   Ht 5\' 2"  (1.575 m)   Wt 121 lb (54.9 kg)   SpO2 98%   BMI 22.13 kg/m   Gen: No acute distress, resting comfortably Neuro: Grossly normal, moves all extremities Psych: Normal affect and thought content      Terance Pomplun M. Jimmey Ralph, MD 08/27/2023 12:02 PM

## 2023-08-28 ENCOUNTER — Telehealth: Payer: Self-pay | Admitting: Family Medicine

## 2023-08-28 NOTE — Telephone Encounter (Signed)
Patient requests to be advised of XRAY results (08/27/23). States she was told that results would take approx. 1 week to be read. Patient requests facility be contacted for sooner reading of XRAY.  Also,  Patient requests to discuss Lab results received on MyChart.

## 2023-08-29 LAB — SEDIMENTATION RATE: Sed Rate: 2 mm/h (ref 0–30)

## 2023-08-29 LAB — MUTATED CITRULLINATED VIMENTIN (MCV) ANTIBODY: MUTATED CITRULLINATED VIMENTIN (MCV) AB: <20 U/mL (ref <20)

## 2023-08-29 LAB — C-REACTIVE PROTEIN: CRP: <3 mg/L (ref <8.0)

## 2023-08-31 NOTE — Progress Notes (Signed)
Xray does not show any new findings. Do not need to change treatment plan. She can follow up with orthopedics as previously planned.

## 2023-08-31 NOTE — Progress Notes (Signed)
Her platelets are elevated likely due to inflamation.   Her B12 and vitamin D are at goal.  The rest of her labs are stable.   Do not need to make any changes to treatment plan. We can recheck in 3-6 months.

## 2023-08-31 NOTE — Telephone Encounter (Signed)
See result notes.  Patricia Underwood. Jimmey Ralph, MD 08/31/2023 3:26 PM

## 2023-08-31 NOTE — Telephone Encounter (Signed)
See note

## 2023-09-03 ENCOUNTER — Encounter: Payer: Self-pay | Admitting: Family Medicine

## 2023-09-03 DIAGNOSIS — G894 Chronic pain syndrome: Secondary | ICD-10-CM | POA: Diagnosis not present

## 2023-09-03 DIAGNOSIS — M47812 Spondylosis without myelopathy or radiculopathy, cervical region: Secondary | ICD-10-CM | POA: Diagnosis not present

## 2023-09-03 DIAGNOSIS — M47816 Spondylosis without myelopathy or radiculopathy, lumbar region: Secondary | ICD-10-CM | POA: Diagnosis not present

## 2023-09-03 DIAGNOSIS — M961 Postlaminectomy syndrome, not elsewhere classified: Secondary | ICD-10-CM | POA: Diagnosis not present

## 2023-09-03 NOTE — Telephone Encounter (Signed)
Please advise 

## 2023-09-03 NOTE — Telephone Encounter (Signed)
Her labs were nonfasting.  This can cause the glucose to be elevated.  Her last A1c was 5.8.  We can continue to monitor for now but do not need to start any medications or make any changes to treatment plan at this time.

## 2023-09-28 ENCOUNTER — Telehealth: Payer: Self-pay | Admitting: Family Medicine

## 2023-09-28 NOTE — Telephone Encounter (Signed)
Pt has been scheduled for 10/07/23 @ 10:30 am for prolia. Thank you.

## 2023-09-28 NOTE — Telephone Encounter (Signed)
Patient would like to schedule an appt for her injection she gets twice a year for osteo..   Can someone give her call to schedule.  Thank You  Gabriel Cirri [facility] TEAM Direct Dial [phone]

## 2023-09-28 NOTE — Telephone Encounter (Signed)
Patient is scheduled for Prolia injection 10/07/23

## 2023-09-29 ENCOUNTER — Telehealth: Payer: Self-pay

## 2023-09-29 NOTE — Telephone Encounter (Signed)
 Prolia VOB initiated via [email or web address]  Next Prolia inj DUE: 10/07/23

## 2023-09-29 NOTE — Telephone Encounter (Signed)
Pt ready for scheduling for PROLIA on or after : 10/07/23  Out-of-pocket cost due at time of visit: $0  Primary: MEDICARE Prolia co-insurance: 0% Admin fee co-insurance: 0%  Secondary: [employer] Prolia co-insurance:  Admin fee co-insurance:   Medical Benefit Details: Date Benefits were checked: 09/29/23 Deductible: $240 MET OF $240 REQUIRED/ Coinsurance: 0%/ Admin Fee: 0%  Prior Auth: N/A PA# Expiration Date:   # of doses approved:  Pharmacy benefit: Copay $--- If patient wants fill through the pharmacy benefit please send prescription to:  --- , and include estimated need by date in rx notes. Pharmacy will ship medication directly to the office.  Patient NOT eligible for Prolia Copay Card. Copay Card can make patient's cost as little as $25. Link to apply: [email or web address]://[email or web address]  ** This summary of benefits is an estimation of the patient's out-of-pocket cost. Exact cost may very based on individual plan coverage.

## 2023-10-02 DIAGNOSIS — M47812 Spondylosis without myelopathy or radiculopathy, cervical region: Secondary | ICD-10-CM | POA: Diagnosis not present

## 2023-10-02 DIAGNOSIS — Z23 Encounter for immunization: Secondary | ICD-10-CM | POA: Diagnosis not present

## 2023-10-02 DIAGNOSIS — G894 Chronic pain syndrome: Secondary | ICD-10-CM | POA: Diagnosis not present

## 2023-10-02 DIAGNOSIS — M961 Postlaminectomy syndrome, not elsewhere classified: Secondary | ICD-10-CM | POA: Diagnosis not present

## 2023-10-02 DIAGNOSIS — M47816 Spondylosis without myelopathy or radiculopathy, lumbar region: Secondary | ICD-10-CM | POA: Diagnosis not present

## 2023-10-07 ENCOUNTER — Other Ambulatory Visit: Payer: Self-pay | Admitting: *Deleted

## 2023-10-07 ENCOUNTER — Ambulatory Visit: Payer: Medicare Other | Admitting: *Deleted

## 2023-10-07 DIAGNOSIS — M8580 Other specified disorders of bone density and structure, unspecified site: Secondary | ICD-10-CM

## 2023-10-07 DIAGNOSIS — M810 Age-related osteoporosis without current pathological fracture: Secondary | ICD-10-CM | POA: Diagnosis not present

## 2023-10-07 MED ORDER — DENOSUMAB 60 MG/ML ~~LOC~~ SOSY
60.0000 mg | PREFILLED_SYRINGE | Freq: Once | SUBCUTANEOUS | Status: AC
Start: 2024-04-05 — End: 2024-04-05
  Administered 2024-04-05: 60 mg via SUBCUTANEOUS

## 2023-10-07 MED ORDER — DENOSUMAB 60 MG/ML ~~LOC~~ SOSY: 60.0000 mg | PREFILLED_SYRINGE | Freq: Once | SUBCUTANEOUS | Status: AC

## 2023-10-07 NOTE — Progress Notes (Signed)
Per orders of Dr. Jimmey Ralph, injection of Prolia 60 mg given IM by Corky Mull, LPN in right upper arm subcutaneous Patient tolerated injection well. Patient will make appointment for 6 month

## 2023-10-07 NOTE — Progress Notes (Signed)
I have reviewed the patient's encounter and agree with the documentation.  Katina Degree. Jimmey Ralph, MD 10/07/2023 11:39 AM

## 2023-10-08 DIAGNOSIS — S62630B Displaced fracture of distal phalanx of right index finger, initial encounter for open fracture: Secondary | ICD-10-CM | POA: Diagnosis not present

## 2023-10-08 DIAGNOSIS — M79641 Pain in right hand: Secondary | ICD-10-CM | POA: Diagnosis not present

## 2023-10-08 DIAGNOSIS — W540XXA Bitten by dog, initial encounter: Secondary | ICD-10-CM | POA: Diagnosis not present

## 2023-10-10 ENCOUNTER — Emergency Department (HOSPITAL_BASED_OUTPATIENT_CLINIC_OR_DEPARTMENT_OTHER)
Admission: EM | Admit: 2023-10-10 | Discharge: 2023-10-10 | Disposition: A | Discharge status: Home / Self Care | Payer: Medicare Other | Attending: Emergency Medicine | Admitting: Emergency Medicine

## 2023-10-10 DIAGNOSIS — S62660A Nondisplaced fracture of distal phalanx of right index finger, initial encounter for closed fracture: Secondary | ICD-10-CM | POA: Insufficient documentation

## 2023-10-10 DIAGNOSIS — S61250A Open bite of right index finger without damage to nail, initial encounter: Secondary | ICD-10-CM | POA: Diagnosis present

## 2023-10-10 DIAGNOSIS — S61451A Open bite of right hand, initial encounter: Secondary | ICD-10-CM

## 2023-10-10 DIAGNOSIS — S62660B Nondisplaced fracture of distal phalanx of right index finger, initial encounter for open fracture: Secondary | ICD-10-CM

## 2023-10-10 DIAGNOSIS — W540XXA Bitten by dog, initial encounter: Secondary | ICD-10-CM | POA: Diagnosis not present

## 2023-10-10 MED ORDER — KETOROLAC TROMETHAMINE 60 MG/2ML IM SOLN
60.0000 mg | Freq: Once | INTRAMUSCULAR | Status: AC
  Administered 2023-10-10: 60 mg via INTRAMUSCULAR
  Filled 2023-10-10: qty 2

## 2023-10-10 NOTE — ED Triage Notes (Signed)
Pt seen in ED Thursday for dog bite to right 2nd finger.  Reports pain has worsened since. Has been taking Rx Abx since and regularly takes 2 percocet q4hr daily for back pain, last dose 830am today.

## 2023-10-10 NOTE — ED Notes (Signed)
Right 2nd finger dressed with xyeo gauge wrapped with Kerlix. Medium finger splint applied.  Secured with co band.

## 2023-10-10 NOTE — Discharge Instructions (Addendum)
It was a pleasure taking care of you this morning.  You were evaluated in the emergency room for a dog bite.  You were given a dose of Toradol for pain and fitted with a splint.  Please wash the wound with warm soapy water 2 times a day.  And apply bacitracin and an clean dry gauze over it. Please continue to take the antibiotics as previously prescribed.  You were given a referral for hand specialist here in [place].  Please call and make an appointment within the next week.

## 2023-10-10 NOTE — ED Provider Notes (Signed)
[name] Provider Note   CSN: [id number] Arrival date & time: 10/10/23  8295     History  Chief Complaint  Patient presents with   Finger Injury    Patricia Underwood is a 72 y.o. female presents for dog bite to right index finger that occurred on Thursday.  Patient was evaluated in the ED in [facility].  X-rays were taken which showed a open tuft fracture.  Patient was given a dose of Toradol, discharged home course of Augmentin and encouraged to use home opioids that she utilizes for chronic back pain.  Referred then to Ortho outpatient follow-up.  Patient is here for repeat evaluation and improved pain control.  Reports persistent pain despite multiple doses of her usual Percocets.  Took two 5 mg pills this morning at 3 AM and again at 8 AM without sustained relief.  She denies any fevers or chills.    HPI     Home Medications Prior to Admission medications   Medication Sig Start Date End Date Taking? Authorizing Provider  atorvastatin (LIPITOR) 20 MG tablet Take 1 tablet (20 mg total) by mouth daily. 12/30/22   Jodelle Red, MD  cyanocobalamin (VITAMIN B12) 1000 MCG/ML injection 1000 mcg (1 mg) injection once every other week 07/08/22   Ardith Dark, MD  oxyCODONE-acetaminophen (PERCOCET/ROXICET) 5-325 MG tablet Take 1 tablet by mouth every 6 (six) hours as needed for severe pain. 05/07/23   Ardith Dark, MD  predniSONE (DELTASONE) 5 MG tablet Take 1 tablet (5 mg total) by mouth daily with breakfast. 08/19/23   Rice, Jamesetta Orleans, MD  triazolam (HALCION) 0.25 MG tablet Take 2 tablets (0.5 mg total) by mouth at bedtime as needed for sleep. 08/27/23   Ardith Dark, MD  Vitamin D, Ergocalciferol, (DRISDOL) 1.25 MG (50000 UNIT) CAPS capsule TAKE 1 CAPSULE BY MOUTH EVERY 7 DAYS 06/05/23   Ardith Dark, MD      Allergies    Ambien [zolpidem tartrate], Flexeril [cyclobenzaprine], Norvasc [amlodipine], Toradol  [ketorolac tromethamine], and Naproxen    Review of Systems   Review of Systems  Constitutional:  Negative for chills and fever.  Musculoskeletal:  Positive for arthralgias.    Physical Exam Updated Vital Signs BP (!) 166/87 (BP Location: Left Arm)   Pulse 73   Temp 98 F (36.7 C) (Oral)   Resp 18   SpO2 97%  Physical Exam Vitals and nursing note reviewed.  Constitutional:      General: She is not in acute distress.    Appearance: She is well-developed.  HENT:     Head: Normocephalic and atraumatic.  Eyes:     Conjunctiva/sclera: Conjunctivae normal.  Cardiovascular:     Pulses: Normal pulses.  Pulmonary:     Effort: Pulmonary effort is normal. No respiratory distress.  Musculoskeletal:        General: No swelling.     Cervical back: Neck supple.     Comments: Avulsion involving the tuft of right index finger. Multiple closed bites wounds down the length of the digit.  No bone visualized.  Hemostasis achieved.  There is no significant swelling, erythema or warmth.  She is able to move her radiocarpal joint and the remainder of her digits actively.  I am able to move her index finger passively.  Skin:    General: Skin is warm and dry.  Neurological:     Mental Status: She is alert.  Psychiatric:  Mood and Affect: Mood normal.     ED Results / Procedures / Treatments   Labs (all labs ordered are listed, but only abnormal results are displayed) Labs Reviewed - No data to display  EKG None  Radiology No results found.  Procedures Procedures    Medications Ordered in ED Medications  ketorolac (TORADOL) injection 60 mg (60 mg Intramuscular Given 10/10/23 1029)    ED Course/ Medical Decision Making/ A&P                                 Medical Decision Making  This patient presents to the ED with chief complaint(s) of dog bite with pertinent past medical history of chronic back pain on opioids.  The complaint involves an extensive differential  diagnosis and also carries with it a high risk of complications and morbidity.    The differential diagnosis includes  Fracture, cellulitis, septic joint, dislocation, systemic infection The initial plan is to  Give IM Toradol. Additional history obtained: No additional historians Records reviewed Care Everywhere/External Records  Initial Assessment:   Patient is nontoxic-appearing, avulsion does not appear to be infected.  She is in clear discomfort however, suspect there is a component of reduced efficacy given her regular opioid use.   Independent ECG interpretation:  None  Independent labs interpretation:  The following labs were independently interpreted:  None  Independent visualization and interpretation of imaging: Review of impression of prior ED x-rays demonstrate distal tuft avulsion fracture involving the right index finger  Treatment and Reassessment: Given IM Toradol for pain and placed in sterile dressing and splint  Consultations obtained:   Hand surgery: Received a call back from Dr. Frazier Butt approximately 11:30 AM recommended wash twice a day with warm soapy water, apply bacitracin on top of the wound, followed by clean and dry sterile dressing.  Outpatient follow-up and continued oral antibiotics.  Disposition:   Patient discharged with close hand follow-up outpatient.  Educated on wound care.  Fitted with a splint for her comfort, although not necessary.  Continue continue oral antibiotics.  The patient has been appropriately medically screened and/or stabilized in the ED. I have low suspicion for any other emergent medical condition which would require further screening, evaluation or treatment in the ED or require inpatient management. At time of discharge the patient is hemodynamically stable and in no acute distress. I have discussed work-up results and diagnosis with patient and answered all questions. Patient is agreeable with discharge plan. We discussed  strict return precautions for returning to the emergency department and they verbalized understanding.     Social Determinants of Health:   None  This note was dictated with voice recognition software.  Despite best efforts at proofreading, errors may have occurred which can change the documentation meaning.           Final Clinical Impression(s) / ED Diagnoses Final diagnoses:  Dog bite of right hand, initial encounter  Nondisplaced fracture of distal phalanx of right index finger, initial encounter for open fracture    Rx / DC Orders ED Discharge Orders     None         Fabienne Bruns 10/10/23 1152    Rondel Baton, MD 10/14/23 [phone]

## 2023-10-13 DIAGNOSIS — S61300A Unspecified open wound of right index finger with damage to nail, initial encounter: Secondary | ICD-10-CM | POA: Diagnosis not present

## 2023-10-15 DIAGNOSIS — S61451A Open bite of right hand, initial encounter: Secondary | ICD-10-CM | POA: Diagnosis not present

## 2023-10-29 ENCOUNTER — Telehealth: Payer: Self-pay

## 2023-10-29 DIAGNOSIS — S61451A Open bite of right hand, initial encounter: Secondary | ICD-10-CM | POA: Diagnosis not present

## 2023-10-29 NOTE — Progress Notes (Signed)
Transition Care Management Follow-up Telephone Call Date of discharge and from where: 10/10/2023 [facility] How have you been since you were released from the hospital? Patient stated her pain level is getting better and she is seeing a hand specialist. Any questions or concerns? No  Items Reviewed: Did the pt receive and understand the discharge instructions provided? Yes  Medications obtained and verified?  No medication prescribed this visit. Other? No  Any new allergies since your discharge? No  Dietary orders reviewed? Yes Do you have support at home? Yes   Follow up appointments reviewed:  PCP Hospital f/u appt confirmed? No  Scheduled to see  on  @ . Specialist Hospital f/u appt confirmed?  Patient stated she is seeing a hand specialist.  Scheduled to see  on  @ . Are transportation arrangements needed? No  If their condition worsens, is the pt aware to call PCP or go to the Emergency Dept.? Yes Was the patient provided with contact information for the PCP's office or ED? Yes Was to pt encouraged to call back with questions or concerns? Yes   Jaydyn Menon [facility]  [facility], [facility] Guide Direct Dial: [phone]  Website: [email or web address]

## 2023-10-30 DIAGNOSIS — G894 Chronic pain syndrome: Secondary | ICD-10-CM | POA: Diagnosis not present

## 2023-10-30 DIAGNOSIS — M47816 Spondylosis without myelopathy or radiculopathy, lumbar region: Secondary | ICD-10-CM | POA: Diagnosis not present

## 2023-10-30 DIAGNOSIS — M961 Postlaminectomy syndrome, not elsewhere classified: Secondary | ICD-10-CM | POA: Diagnosis not present

## 2023-10-30 DIAGNOSIS — M47812 Spondylosis without myelopathy or radiculopathy, cervical region: Secondary | ICD-10-CM | POA: Diagnosis not present

## 2023-11-12 NOTE — Progress Notes (Signed)
 Office Visit Note  Patient: Patricia Underwood             Date of Birth: Nov 04, 1951           MRN: [id number]             PCP: Kennyth Worth HERO, MD Referring: Kennyth Worth HERO, MD Visit Date: 11/16/2023   Subjective:  Follow-up  Discussed the use of AI scribe software for clinical note transcription with the patient, who gave verbal consent to proceed.  History of Present Illness   Patricia Underwood is a 73 y.o. female here for follow up for rheumatoid arthritis and sicca syndrome on prednisone  5 mg daily.  She presents with a recent exacerbation of symptoms. They report increased swelling and pain in their hands, particularly in the left hand, which they have been using more due to an injury to the right hand. The patient describes the swelling as significant, preventing them from wearing rings, and the pain as constant. They also note that the cold weather exacerbates the discomfort.  The patient has been on prednisone  5 mg daily, which they take at night. She tried taking gabapentin  that did not help. They also mention a previous trial of meloxicam, which did not provide any additional benefit.  In addition to the arthritis, the patient is recovering from an avulsion injury to the right hand caused by a dog bite. The injury involved the nail bed and a piece of the finger, leading to concerns about future range of motion. The patient has been advised to frequently bend the finger to prevent stiffness and loss of mobility.  The patient's arthritis and hand injury have been causing significant discomfort and functional impairment, prompting them to seek further treatment options.       Previous HPI 08/19/2023 Patricia Underwood is a 73 y.o. female here for follow up for rheumatoid arthritis and sicca syndrome on prednisone  5 mg daily.  Her main complaint is increase in swelling pain and difficulty using her left hand.  This is problematic as it is her primary hand.  She had previous surgery for the tendon  rupture that healed up well she saw Dr. 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  She is noticing swelling mostly around the MCP joints and more distally distributed throughout her hand, typically gets worse after prolonged use and activity.  Is not having any symptoms of numbness or weakness.  Does not see any associated rash or skin change.   Previous HPI 05/19/2023 Patricia Underwood is a 73 y.o. female here for follow up follow-up for rheumatoid arthritis and sicca syndrome.  Since her last visit she is doing better with healing from her hand surgery still has ongoing daily joint pain and stiffness in multiple areas.  Not seeing a lot of visible swelling redness or warmth in affected sites.  Did not notice appreciable difference with trial of resuming the previously prescribed duloxetine .   Previous HPI 01/05/23 Patricia Underwood is a 73 y.o. female here for follow up for rheumatoid arthritis and sicca syndrome.  Evaluation at last visit without much peripheral joint synovitis noted recommended starting duloxetine  for osteoarthritis related pain.  Shortly after starting medication she noticed new headaches so she stopped taking it and these headaches resolved.  She also started taking the pilocarpine  again but she already had medicine on hand this is partially helpful and not noticing any major side effect.  Hand surgery for the extensor tendon rupture with  Dr. Sissy when okay she is currently wearing immobilizing brace which causes a lot of difficulty as she is left-hand dominant.  She is also been recommended to avoid a lot of walking around because of previous falls incurring hand and wrist injuries.  She notices increased back and bilateral leg pain with her decrease in walking and overall physical activity.  No definite flareup of joint swelling.  Still concerned about the bony nodules on her fingers and feet.    Previous HPI 11/24/22 Patricia Underwood is a 73 y.o. female  here for rheumatoid arthritis and sicca syndrome. She has a history of chronic joint pain in multiple areas with known  osteoarthritis and with fibromyalgia syndrome. There has been some question of sjogren syndrome due to ongoing issues with dry mouth and accelerated dental decay but with negative serology. She saw Dr. Ziolkowska last year for evaluation findings showing no objective peripheral joint synovitis and serum inflammatory markers and RA related antibodies were negative. She has been treated with intermittent prednisone  which works well but the symptoms return when off treatment. More recently sustained a fall with associated wrist fracture and rupture of EPL tendon seeing hand surgery for this.  She does have somewhat generalized joint pains and morning stiffness lasting up to about 1 hour.  Typically does not see any obvious joint swelling or effusions.  She is not able to safely take oral nonsteroidal anti-inflammatory drugs. Dry mouth symptoms are pretty bothersome and she has experienced change in taste along with chronic irritation of the tongue.  She has been prescribed pilocarpine  previously but did not take the medicine very consistently due to somewhat cost prohibitive and also had increased sweating side effects.  She does not have any particular history with inflammatory eye complications does not generally notice lymphadenopathy or any swelling in the area near major salivary glands.     Review of Systems  Constitutional:  Negative for fatigue.  HENT:  Positive for mouth dryness. Negative for mouth sores.   Eyes:  Positive for dryness.  Respiratory:  Negative for shortness of breath.   Cardiovascular:  Negative for chest pain and palpitations.  Gastrointestinal:  Negative for blood in stool, constipation and diarrhea.  Endocrine: Negative for increased urination.  Genitourinary:  Negative for involuntary urination.  Musculoskeletal:  Positive for joint pain, joint pain, joint  swelling, myalgias, muscle weakness, morning stiffness and myalgias. Negative for gait problem and muscle tenderness.  Skin:  Negative for color change, rash, hair loss and sensitivity to sunlight.  Allergic/Immunologic: Negative for susceptible to infections.  Neurological:  Negative for dizziness and headaches.  Hematological:  Negative for swollen glands.  Psychiatric/Behavioral:  Positive for sleep disturbance. Negative for depressed mood. The patient is not nervous/anxious.     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   Essential hypertension 08/14/2022   Chronic low back pain with sciatica 09/25/2021   Cervical radiculopathy 09/25/2021   Alopecia 09/25/2021   Short-term memory loss 07/23/2021   Rheumatoid arthritis (HCC) 03/28/2021   Migraine 03/28/2021   Knee pain 02/14/2021   Esophageal spasm    Aortic atherosclerosis (HCC)    Coronary artery disease due to calcified coronary lesion 07/30/2020   IGT (impaired glucose tolerance) 03/20/2020   Chronic pain syndrome 07/04/2019   Dysphasia 04/21/2019   Sicca syndrome (HCC) 04/21/2019   Dental caries 04/21/2019   Osteopenia 10/25/2018   Fibromyalgia  09/11/2018   Sensorineural hearing loss (SNHL) of both ears, followed by [facility], [facility] 07/20/2018   Vitamin D  deficiency 07/05/2017   B12 deficiency, Rx B12 injections 07/05/2017   Insomnia 07/05/2017   Chronic right shoulder pain 05/30/2017   HLD (hyperlipidemia) 05/30/2017    Past Medical History:  Diagnosis Date   Aortic atherosclerosis (HCC)    Arthritis    B12 deficiency, Rx B12 injections 07/05/2017   Chronic back pain    Chronic right shoulder pain 05/30/2017   Coronary artery calcification seen on CAT scan    Diverticulitis    Diverticulosis    Esophageal spasm    Fibromyalgia    HLD (hyperlipidemia) 05/30/2017   HTN (hypertension)     Torn meniscus    right    Family History  Problem Relation Age of Onset   CAD Mother        died of massive MI at 21   Heart attack Mother    CVA Father        x 2   Colon cancer Father    Hyperlipidemia Sister    Stroke Paternal Grandfather    Hypothyroidism Daughter    Hyperthyroidism Son    Diabetes Neg Hx    Past Surgical History:  Procedure Laterality Date   ABDOMINAL HYSTERECTOMY     LIGAMENT REPAIR Left 12/15/2022   Thumb   LUMBAR FUSION     L4-L5   SHOULDER ARTHROSCOPY Right    TONSILLECTOMY AND ADENOIDECTOMY     Social History   Social History Narrative   Not on file   Immunization History  Administered Date(s) Administered   Influenza-Unspecified 11/10/2018   Moderna Covid-19 Fall Seasonal Vaccine 67yrs & older 12/01/2022   PFIZER(Purple Top)SARS-COV-2 Vaccination 12/13/2019, 01/03/2020, 08/07/2020, 05/30/2021, 09/06/2021   Tdap 05/31/2013, 03/30/2022     Objective: Vital Signs: BP (!) 160/81 (BP Location: Left Arm, Patient Position: Sitting, Cuff Size: Normal)   Pulse 70   Resp 14   Ht 5' 2 (1.575 m)   Wt 118 lb (53.5 kg)   BMI 21.58 kg/m    Physical Exam HENT:     Mouth/Throat:     Mouth: Mucous membranes are dry.     Pharynx: Oropharynx is clear.     Comments: Flattening of lingual papillae Eyes:     Conjunctiva/sclera: Conjunctivae normal.  Cardiovascular:     Rate and Rhythm: Normal rate and regular rhythm.  Pulmonary:     Effort: Pulmonary effort is normal.     Breath sounds: Normal breath sounds.  Lymphadenopathy:     Cervical: No cervical adenopathy.  Skin:    General: Skin is warm and dry.  Neurological:     Mental Status: She is alert.  Psychiatric:        Mood and Affect: Mood normal.      Musculoskeletal Exam:  Shoulders full ROM no tenderness or swelling Elbows full ROM no tenderness or swelling Left wrist tenderness to pressure worse on flexor side Fingers with right hand DIP heberdon's nodes, left hand milder  changes worst at [facility] and diffuse swelling of several fingers not localized to joints, no warmth or erythema Knees full ROM no tenderness or swelling   Investigation: No additional findings.  Imaging: No results found.  Recent Labs: Lab Results  Component Value Date   WBC 10.0 08/27/2023   HGB 13.5 08/27/2023   PLT 504.0 (H) 08/27/2023   NA 139 08/27/2023   K 3.7 08/27/2023  CL 100 08/27/2023   CO2 28 08/27/2023   GLUCOSE 141 (H) 08/27/2023   BUN 16 08/27/2023   CREATININE 0.57 08/27/2023   BILITOT 0.5 08/27/2023   ALKPHOS 57 08/27/2023   AST 23 08/27/2023   ALT 21 08/27/2023   PROT 6.7 08/27/2023   ALBUMIN 4.3 08/27/2023   CALCIUM  9.4 08/27/2023    Speciality Comments: No specialty comments available.  Procedures:  No procedures performed Allergies: Ambien [zolpidem tartrate], Flexeril [cyclobenzaprine], Norvasc  [amlodipine ], Toradol  [ketorolac  tromethamine ], and Naproxen   Assessment / Plan:     Visit Diagnoses: Rheumatoid arthritis of multiple sites with negative rheumatoid factor (HCC) - gabapentin  100 mg at night - Plan: predniSONE  (DELTASONE ) 5 MG tablet Increased pain and swelling in hands, particularly in the left hand. Some hypertrophy of the joint lining noted on ultrasound but with prominent osteophytes and no effusions. Currently on Prednisone  5mg  daily, which is not providing sufficient relief. -Increase Prednisone  to 10mg  daily for two weeks to assess for symptom improvement. -Consider adding Hydroxychloroquine for long-term management if increased Prednisone  dose is beneficial. Provided patient with information to review. -Return to Prednisone  5mg  daily after two weeks and monitor for symptom changes. -Contact office with any changes or concerns.  Long term (current) use of systemic steroids - prednisone  5 mg  Chronic pain syndrome - chronic low-dose oxycodone  Managed through PCP office 5mg /day.  Sicca syndrome (HCC) Still dry without major  complications. Discussed continuing pilocarpine  5 mg twice daily as needed or can interrupt treatment as she prefers.   Avulsion Injury Healing injury to finger from dog bite. Concerns about decreased range of motion due to injury to the fingernail growth plate. -Continue to encourage movement of the finger to prevent joint stiffening and loss of range of motion.  Follow-up Plan for follow-up in 2-3 months to assess arthritis management and response to increased Prednisone  dose. Patient may also contact office after two weeks of increased Prednisone  dose to report any changes.    Orders: No orders of the defined types were placed in this encounter.  Meds ordered this encounter  Medications   predniSONE  (DELTASONE ) 5 MG tablet    Sig: Take 2 tablets (10 mg total) by mouth daily with breakfast for 14 days, THEN 1 tablet (5 mg total) daily with breakfast.    Dispense:  104 tablet    Refill:  0     Follow-Up Instructions: Return in about 2 months (around 01/14/2024) for OA/RA on GC f/u 2-1mos.   Lonni LELON Ester, MD  Note - This record has been created using [employer].  Chart creation errors have been sought, but may not always  have been located. Such creation errors do not reflect on  the standard of medical care.

## 2023-11-16 ENCOUNTER — Encounter: Payer: Self-pay | Admitting: Internal Medicine

## 2023-11-16 ENCOUNTER — Ambulatory Visit: Payer: Medicare Other | Attending: Internal Medicine | Admitting: Internal Medicine

## 2023-11-16 VITALS — BP 160/81 | HR 70 | Resp 14 | Ht 62.0 in | Wt 118.0 lb

## 2023-11-16 DIAGNOSIS — Z7952 Long term (current) use of systemic steroids: Secondary | ICD-10-CM | POA: Insufficient documentation

## 2023-11-16 DIAGNOSIS — M0609 Rheumatoid arthritis without rheumatoid factor, multiple sites: Secondary | ICD-10-CM | POA: Diagnosis not present

## 2023-11-16 DIAGNOSIS — M3500 Sicca syndrome, unspecified: Secondary | ICD-10-CM | POA: Insufficient documentation

## 2023-11-16 DIAGNOSIS — G894 Chronic pain syndrome: Secondary | ICD-10-CM | POA: Insufficient documentation

## 2023-11-16 MED ORDER — PREDNISONE 5 MG PO TABS
ORAL_TABLET | ORAL | 0 refills | Status: DC
Start: 2023-11-16 — End: 2024-01-14

## 2023-11-17 ENCOUNTER — Ambulatory Visit: Payer: Medicare Other

## 2023-11-17 VITALS — Wt 118.0 lb

## 2023-11-17 DIAGNOSIS — Z1231 Encounter for screening mammogram for malignant neoplasm of breast: Secondary | ICD-10-CM | POA: Diagnosis not present

## 2023-11-17 DIAGNOSIS — Z0000 Encounter for general adult medical examination without abnormal findings: Secondary | ICD-10-CM

## 2023-11-17 NOTE — Patient Instructions (Addendum)
 Ms. Recchia , Thank you for taking time to come for your Medicare Wellness Visit. I appreciate your ongoing commitment to your health goals. Please review the following plan we discussed and let me know if I can assist you in the future.   Referrals/Orders/Follow-Ups/Clinician Recommendations: continue to stay healthy and active   A referral has been placed today for your annual mammogram  [facility] Mammography   [facility] Address: [address] [place], [place], [place] [address] Hours:  Open ? Closes 5?PM Phone: [phone]  This is a list of the screening recommended for you and due dates:  Health Maintenance  Topic Date Due   Mammogram  11/05/2020   COVID-19 Vaccine (7 - 2024-25 season) 07/12/2023   Pneumonia Vaccine (1 of 2 - PCV) 08/26/2024*   Medicare Annual Wellness Visit  11/16/2024   DTaP/Tdap/Td vaccine (4 - Td or Tdap) 10/07/2033   DEXA scan (bone density measurement)  Completed   HPV Vaccine  Aged Out   Flu Shot  Discontinued   Colon Cancer Screening  Discontinued   Zoster (Shingles) Vaccine  Discontinued  *Topic was postponed. The date shown is not the original due date.    Advanced directives: (Copy Requested) Please bring a copy of your health care power of attorney and living will to the office to be added to your chart at your convenience.  Next Medicare Annual Wellness Visit scheduled for next year: Yes

## 2023-11-17 NOTE — Progress Notes (Signed)
 Subjective:   Patricia Underwood is a 73 y.o. female who presents for Medicare Annual (Subsequent) preventive examination.  Visit Complete: Virtual I connected with  [name] on 11/17/23 by a audio enabled telemedicine application and verified that I am speaking with the correct person using two identifiers.  Patient Location: Home  Provider Location: Office/Clinic  I discussed the limitations of evaluation and management by telemedicine. The patient expressed understanding and agreed to proceed.  Vital Signs: Because this visit was a virtual/telehealth visit, some criteria may be missing or patient reported. Any vitals not documented were not able to be obtained and vitals that have been documented are patient reported.   Cardiac Risk Factors include: advanced age (>22men, >11 women);dyslipidemia;hypertension     Objective:    Today's Vitals   11/17/23 1146  Weight: 118 lb (53.5 kg)   Body mass index is 21.58 kg/m.     11/17/2023   11:50 AM 10/10/2023   10:01 AM 10/28/2022   11:18 AM 08/30/2022    8:32 AM 06/09/2022    6:41 AM 03/30/2022   12:38 PM 10/22/2021   10:27 AM  Advanced Directives  Does Patient Have a Medical Advance Directive? Yes No Yes No No No Yes  Type of Estate Agent of [place];Living will  Healthcare Power of [place];Living will      Does patient want to make changes to medical advance directive?       No - Patient declined  Copy of Healthcare Power of Attorney in Chart? No - copy requested  No - copy requested      Would patient like information on creating a medical advance directive?    No - Patient declined No - Patient declined      Current Medications (verified) Outpatient Encounter Medications as of 11/17/2023  Medication Sig   atorvastatin  (LIPITOR) 20 MG tablet Take 1 tablet (20 mg total) by mouth daily.   cyanocobalamin  (VITAMIN B12) 1000 MCG/ML injection 1000 mcg (1 mg) injection once every other week    oxyCODONE -acetaminophen  (PERCOCET/ROXICET) 5-325 MG tablet Take 1 tablet by mouth every 6 (six) hours as needed for severe pain.   predniSONE  (DELTASONE ) 5 MG tablet Take 2 tablets (10 mg total) by mouth daily with breakfast for 14 days, THEN 1 tablet (5 mg total) daily with breakfast.   triazolam  (HALCION ) 0.25 MG tablet Take 2 tablets (0.5 mg total) by mouth at bedtime as needed for sleep.   Vitamin D , Ergocalciferol , (DRISDOL ) 1.25 MG (50000 UNIT) CAPS capsule TAKE 1 CAPSULE BY MOUTH EVERY 7 DAYS   Facility-Administered Encounter Medications as of 11/17/2023  Medication   denosumab  (PROLIA ) injection 60 mg   [START ON 04/05/2024] denosumab  (PROLIA ) injection 60 mg    Allergies (verified) Ambien [zolpidem tartrate], Flexeril [cyclobenzaprine], Norvasc  [amlodipine ], Toradol  [ketorolac  tromethamine ], and Naproxen   History: Past Medical History:  Diagnosis Date   Aortic atherosclerosis (HCC)    Arthritis    B12 deficiency, Rx B12 injections 07/05/2017   Chronic back pain    Chronic right shoulder pain 05/30/2017   Coronary artery calcification seen on CAT scan    Diverticulitis    Diverticulosis    Esophageal spasm    Fibromyalgia    HLD (hyperlipidemia) 05/30/2017   HTN (hypertension)    Torn meniscus    right   Past Surgical History:  Procedure Laterality Date   ABDOMINAL HYSTERECTOMY     LIGAMENT REPAIR Left 12/15/2022   Thumb   LUMBAR FUSION  L4-L5   SHOULDER ARTHROSCOPY Right    TONSILLECTOMY AND ADENOIDECTOMY     Family History  Problem Relation Age of Onset   CAD Mother        died of massive MI at 44   Heart attack Mother    CVA Father        x 2   Colon cancer Father    Hyperlipidemia Sister    Stroke Paternal Grandfather    Hypothyroidism Daughter    Hyperthyroidism Son    Diabetes Neg Hx    Social History   Socioeconomic History   Marital status: Married    Spouse name: Not on file   Number of children: 2   Years of education: Not on file    Highest education level: Master's degree (e.g., MA, MS, MEng, MEd, MSW, MBA)  Occupational History   Occupation: Retired   Tobacco Use   Smoking status: Former    Current packs/day: 0.00    Types: Cigarettes    Quit date: 2018    Years since quitting: 7.0    Passive exposure: Never   Smokeless tobacco: Never  Vaping Use   Vaping status: Never Used  Substance and Sexual Activity   Alcohol use: Yes    Alcohol/week: 7.0 standard drinks of alcohol    Types: 7 Glasses of wine per week    Comment: 1 glass of wine nightly   Drug use: No   Sexual activity: Not Currently    Partners: Male    Birth control/protection: None  Other Topics Concern   Not on file  Social History Narrative   Not on file   Social Drivers of Health   Financial Resource Strain: Low Risk  (11/17/2023)   Overall Financial Resource Strain (CARDIA)    Difficulty of Paying Living Expenses: Not hard at all  Food Insecurity: No Food Insecurity (11/17/2023)   Hunger Vital Sign    Worried About Running Out of Food in the Last Year: Never true    Ran Out of Food in the Last Year: Never true  Transportation Needs: No Transportation Needs (11/17/2023)   PRAPARE - Administrator, Civil Service (Medical): No    Lack of Transportation (Non-Medical): No  Physical Activity: Sufficiently Active (11/17/2023)   Exercise Vital Sign    Days of Exercise per Week: 7 days    Minutes of Exercise per Session: 120 min  Stress: No Stress Concern Present (11/17/2023)   [employer] of Occupational Health - Occupational Stress Questionnaire    Feeling of Stress : Not at all  Social Connections: Moderately Isolated (11/17/2023)   Social Connection and Isolation Panel [NHANES]    Frequency of Communication with Friends and Family: More than three times a week    Frequency of Social Gatherings with Friends and Family: More than three times a week    Attends Religious Services: Never    Database Administrator or Organizations: No     Attends Engineer, Structural: Never    Marital Status: Married    Tobacco Counseling Counseling given: Not Answered   Clinical Intake:  Pre-visit preparation completed: Yes  Pain : No/denies pain     BMI - recorded: 21.58 Nutritional Status: BMI of 19-24  Normal Nutritional Risks: None Diabetes: No  How often do you need to have someone help you when you read instructions, pamphlets, or other written materials from your doctor or pharmacy?: 1 - Never  Interpreter Needed?: No  Information entered  by :: Ellouise Haws, LPN   Activities of Daily Living    11/17/2023   11:47 AM  In your present state of health, do you have any difficulty performing the following activities:  Hearing? 1  Comment hearing aids  Vision? 0  Difficulty concentrating or making decisions? 0  Walking or climbing stairs? 0  Dressing or bathing? 0  Doing errands, shopping? 0  Preparing Food and eating ? N  Using the Toilet? N  In the past six months, have you accidently leaked urine? N  Do you have problems with loss of bowel control? N  Managing your Medications? N  Managing your Finances? N  Housekeeping or managing your Housekeeping? N    Patient Care Team: Kennyth Worth HERO, MD as PCP - General (Family Medicine) Lonni Slain, MD as PCP - Cardiology (Cardiology) Kassie Acquanetta Bradley, MD as Consulting Physician (Pulmonary Disease) Marquette Ozell BIRCH, DO as Consulting Physician (Sports Medicine) Colon Shove, MD as Consulting Physician (Neurosurgery)  Indicate any recent Medical Services you may have received from other than [facility] providers in the past year (date may be approximate).     Assessment:   This is a routine wellness examination for Patricia Underwood.  Hearing/Vision screen Hearing Screening - Comments:: Pt wears hearing aids  Vision Screening - Comments:: Pt follows up with Dr for annual eye exams    Goals Addressed             This Visit's Progress    Patient  Stated       Continue to stay healthy        Depression Screen    11/17/2023   11:49 AM 08/27/2023   11:17 AM 04/15/2023    9:45 AM 01/27/2023   11:42 AM 10/28/2022   11:17 AM 10/09/2022   10:30 AM 06/05/2022   10:50 AM  PHQ 2/9 Scores  PHQ - 2 Score 0 0 0 0 0 0 0    Fall Risk    11/17/2023   11:51 AM 08/27/2023   11:17 AM 04/15/2023    9:45 AM 01/27/2023   11:42 AM 01/14/2023    9:57 AM  Fall Risk   Falls in the past year? 0 0  0 1  Number falls in past yr: 0 0 0 0 1  Injury with Fall? 0 0 0 0 1  Risk for fall due to : No Fall Risks No Fall Risks No Fall Risks No Fall Risks History of fall(s)  Follow up Falls prevention discussed    Falls evaluation completed    MEDICARE RISK AT HOME: Medicare Risk at Home Any stairs in or around the home?: Yes If so, are there any without handrails?: No Home free of loose throw rugs in walkways, pet beds, electrical cords, etc?: Yes Adequate lighting in your home to reduce risk of falls?: Yes Life alert?: No Use of a cane, walker or w/c?: No Grab bars in the bathroom?: No Shower chair or bench in shower?: No Elevated toilet seat or a handicapped toilet?: No  TIMED UP AND GO:  Was the test performed?  No    Cognitive Function:Declined     11/17/2023   11:52 AM 09/11/2018    6:20 PM  MMSE - Mini Mental State Exam  Not completed: Refused   Orientation to time  5  Orientation to Place  5  Registration  3  Attention/ Calculation  5  Recall  3  Language- name 2 objects  2  Language- repeat  1  Language- follow 3 step command  3  Language- read & follow direction  1  Write a sentence  1  Copy design  1  Total score  30        10/28/2022   11:21 AM  6CIT Screen  What Year? 0 points  What month? 0 points  What time? 0 points  Count back from 20 0 points  Months in reverse 0 points  Repeat phrase 0 points  Total Score 0 points    Immunizations Immunization History  Administered Date(s) Administered    Influenza-Unspecified 11/10/2018   Moderna Covid-19 Fall Seasonal Vaccine 75yrs & older 12/01/2022   PFIZER(Purple Top)SARS-COV-2 Vaccination 12/13/2019, 01/03/2020, 08/07/2020, 05/30/2021, 09/06/2021   Tdap 05/31/2013, 03/30/2022, 10/08/2023    TDAP status: Up to date  Flu Vaccine status: Declined, Education has been provided regarding the importance of this vaccine but patient still declined. Advised may receive this vaccine at local pharmacy or Health Dept. Aware to provide a copy of the vaccination record if obtained from local pharmacy or Health Dept. Verbalized acceptance and understanding.  Pneumococcal vaccine status: Due, Education has been provided regarding the importance of this vaccine. Advised may receive this vaccine at local pharmacy or Health Dept. Aware to provide a copy of the vaccination record if obtained from local pharmacy or Health Dept. Verbalized acceptance and understanding.  Covid-19 vaccine status: Information provided on how to obtain vaccines.   Qualifies for Shingles Vaccine? Yes   Zostavax completed No   Shingrix Completed?: No.    Education has been provided regarding the importance of this vaccine. Patient has been advised to call insurance company to determine out of pocket expense if they have not yet received this vaccine. Advised may also receive vaccine at local pharmacy or Health Dept. Verbalized acceptance and understanding.  Screening Tests Health Maintenance  Topic Date Due   MAMMOGRAM  11/05/2020   COVID-19 Vaccine (7 - 2024-25 season) 07/12/2023   Pneumonia Vaccine 55+ Years old (1 of 2 - PCV) 08/26/2024 (Originally 01/02/1957)   Medicare Annual Wellness (AWV)  11/16/2024   DTaP/Tdap/Td (4 - Td or Tdap) 10/07/2033   DEXA SCAN  Completed   HPV VACCINES  Aged Out   INFLUENZA VACCINE  Discontinued   Colonoscopy  Discontinued   Zoster Vaccines- Shingrix  Discontinued    Health Maintenance  Health Maintenance Due  Topic Date Due   MAMMOGRAM   11/05/2020   COVID-19 Vaccine (7 - 2024-25 season) 07/12/2023    Colorectal cancer screening: No longer required.   Mammogram status: Ordered 11/17/23. Pt provided with contact info and advised to call to schedule appt.   Bone Density status: Completed 10/15/18. Results reflect: Bone density results: OSTEOPENIA. Repeat every 2 years.   Additional Screening:  Hepatitis C Screening: does not qualify;  Vision Screening: Recommended annual ophthalmology exams for early detection of glaucoma and other disorders of the eye. Is the patient up to date with their annual eye exam?  Yes  Who is the provider or what is the name of the office in which the patient attends annual eye exams? Unsure of providers name near [employer]  If pt is not established with a provider, would they like to be referred to a provider to establish care? No .   Dental Screening: Recommended annual dental exams for proper oral hygiene   Community Resource Referral / Chronic Care Management: CRR required this visit?  No   CCM required this visit?  No  Plan:     I have personally reviewed and noted the following in the patient's chart:   Medical and social history Use of alcohol, tobacco or illicit drugs  Current medications and supplements including opioid prescriptions. Patient is currently taking opioid prescriptions. Information provided to patient regarding non-opioid alternatives. Patient advised to discuss non-opioid treatment plan with their provider. Functional ability and status Nutritional status Physical activity Advanced directives List of other physicians Hospitalizations, surgeries, and ER visits in previous 12 months Vitals Screenings to include cognitive, depression, and falls Referrals and appointments  In addition, I have reviewed and discussed with patient certain preventive protocols, quality metrics, and best practice recommendations. A written personalized care plan for  preventive services as well as general preventive health recommendations were provided to patient.     Ellouise VEAR Haws, LPN   06/11/7973   After Visit Summary: (MyChart) Due to this being a telephonic visit, the after visit summary with patients personalized plan was offered to patient via MyChart   Nurse Notes: Pt declined cognition testing ,pt knowledgeable to questions asked during AWV

## 2023-11-23 ENCOUNTER — Encounter: Payer: Self-pay | Admitting: Family Medicine

## 2023-11-23 ENCOUNTER — Telehealth (INDEPENDENT_AMBULATORY_CARE_PROVIDER_SITE_OTHER): Payer: Medicare Other | Admitting: Family Medicine

## 2023-11-23 DIAGNOSIS — U071 COVID-19: Secondary | ICD-10-CM

## 2023-11-23 MED ORDER — NIRMATRELVIR/RITONAVIR (PAXLOVID)TABLET: 3.0000 | ORAL_TABLET | Freq: Two times a day (BID) | ORAL | 0 refills | Status: AC

## 2023-11-23 MED ORDER — PREDNISONE 20 MG PO TABS
40.0000 mg | ORAL_TABLET | Freq: Every day | ORAL | 0 refills | Status: AC
Start: 2023-11-23 — End: 2023-11-28

## 2023-11-23 MED ORDER — ONDANSETRON HCL 4 MG PO TABS: 4.0000 mg | ORAL_TABLET | Freq: Three times a day (TID) | ORAL | 0 refills | Status: DC | PRN

## 2023-11-23 MED ORDER — BENZONATATE 100 MG PO CAPS: 100.0000 mg | ORAL_CAPSULE | Freq: Three times a day (TID) | ORAL | 0 refills | Status: DC | PRN

## 2023-11-23 NOTE — Telephone Encounter (Signed)
 Copied from CRM [id number]. Topic: Clinical - Medication Refill >> Nov 23, 2023  9:07 AM Viola FALCON wrote: Most Recent Primary Care Visit:  Provider: JAYLENE ELLOUISE DEL  Department: [facility]  Visit Type: MEDICARE AWV, SEQUENTIAL  Date: 11/17/2023  Medication: Patient has covid - on and off temp, nausea, body aches ands congestion. Would like some medication sent pharmacy   Has the patient contacted their pharmacy? No (Agent: If no, request that the patient contact the pharmacy for the refill. If patient does not wish to contact the pharmacy document the reason why and proceed with request.) (Agent: If yes, when and what did the pharmacy advise?)  Is this the correct pharmacy for this prescription? Yes If no, delete pharmacy and type the correct one.  This is the patient's preferred pharmacy:    [id number] [place], [place] - [address] [place] [address] Phone: [phone] Fax: [phone]   Has the prescription been filled recently? Yes  Is the patient out of the medication? Yes  Has the patient been seen for an appointment in the last year OR does the patient have an upcoming appointment? Yes  Can we respond through MyChart? Yes  Agent: Please be advised that Rx refills may take up to 3 business days. We ask that you follow-up with your pharmacy.

## 2023-11-23 NOTE — Telephone Encounter (Signed)
 Copied from CRM [id number]. Topic: Clinical - Medication Question >> Nov 23, 2023  9:10 AM Viola F wrote: Reason for RMF:Ejupzwu would like call back - would like advice on medication because she heard the covid medication interferes with her tramadol . And she would like to know when she can go back out in public since she currently has covid

## 2023-11-23 NOTE — Telephone Encounter (Signed)
 Please schedule an office visit for assessment and treatment

## 2023-11-23 NOTE — Progress Notes (Signed)
 MyChart Video Visit Virtual Visit via Video Note   This visit type was conducted w/patient consent. This format is felt to be most appropriate for this patient at this time. Physical exam was limited by quality of the video and audio technology used for the visit. CMA was able to get the patient set up on a video visit.  Patient location: Home. Patient and provider in visit Provider location: Office  I discussed the limitations of evaluation and management by telemedicine and the availability of in person appointments. The patient expressed understanding and agreed to proceed.  Visit Date: 11/23/2023  Today's healthcare provider: Jenkins CHRISTELLA Carrel, MD     Subjective:    Patient ID: Slater Sables, female    DOB: [birth year], 73 y.o.   MRN: [id number]  Chief Complaint  Patient presents with   Covid Positive    HPI-started on video but couldn't hear pt so finished on phone Has been sick since 1/11.  Had diarrhea and vomiting and now, HA and chest tight and nose running.  Temp 100.  Feels sick.  Pt did covid test and +.  80yo neighbor has as well.  Husb is 80 and on feeding tube and O2.  Pt on 10mg  pred from rheum.   Past Medical History:  Diagnosis Date   Aortic atherosclerosis (HCC)    Arthritis    B12 deficiency, Rx B12 injections 07/05/2017   Chronic back pain    Chronic right shoulder pain 05/30/2017   Coronary artery calcification seen on CAT scan    Diverticulitis    Diverticulosis    Esophageal spasm    Fibromyalgia    HLD (hyperlipidemia) 05/30/2017   HTN (hypertension)    Torn meniscus    right    Past Surgical History:  Procedure Laterality Date   ABDOMINAL HYSTERECTOMY     LIGAMENT REPAIR Left 12/15/2022   Thumb   LUMBAR FUSION     L4-L5   SHOULDER ARTHROSCOPY Right    TONSILLECTOMY AND ADENOIDECTOMY      Outpatient Medications Prior to Visit  Medication Sig Dispense Refill   atorvastatin  (LIPITOR) 20 MG tablet Take 1 tablet (20 mg total) by mouth daily. 90  tablet 3   cyanocobalamin  (VITAMIN B12) 1000 MCG/ML injection 1000 mcg (1 mg) injection once every other week 6 mL 3   oxyCODONE -acetaminophen  (PERCOCET/ROXICET) 5-325 MG tablet Take 1 tablet by mouth every 6 (six) hours as needed for severe pain. 90 tablet 0   predniSONE  (DELTASONE ) 5 MG tablet Take 2 tablets (10 mg total) by mouth daily with breakfast for 14 days, THEN 1 tablet (5 mg total) daily with breakfast. 104 tablet 0   triazolam  (HALCION ) 0.25 MG tablet Take 2 tablets (0.5 mg total) by mouth at bedtime as needed for sleep. 60 tablet 5   Vitamin D , Ergocalciferol , (DRISDOL ) 1.25 MG (50000 UNIT) CAPS capsule TAKE 1 CAPSULE BY MOUTH EVERY 7 DAYS 12 capsule 3   Facility-Administered Medications Prior to Visit  Medication Dose Route Frequency Provider Last Rate Last Admin   denosumab  (PROLIA ) injection 60 mg  60 mg Subcutaneous Once Parker, Caleb M, MD       NOREEN ON 04/05/2024] denosumab  (PROLIA ) injection 60 mg  60 mg Subcutaneous Once Parker, Caleb M, MD        Allergies  Allergen Reactions   Ambien [Zolpidem Tartrate]    Flexeril [Cyclobenzaprine] Other (See Comments)    Patient unsure of reaction   Norvasc  [Amlodipine ] Swelling    Edema of feet  and ankles    Toradol  [Ketorolac  Tromethamine ]    Naproxen Other (See Comments)    Patient unsure of reaction.        Objective:     Physical Exam  Vitals and nursing note reviewed.  Constitutional:      General:  is not in acute distress.    Appearance: Normal appearance.  HENT:     Head: Normocephalic.  Pulmonary:     Effort: No respiratory distress.  Skin:    General: Skin is dry.     Coloration: Skin is not pale.  Neurological:     Mental Status: Pt is alert and oriented to person, place, and time.  Psychiatric:        Mood and Affect: Mood normal.   There were no vitals taken for this visit.  Wt Readings from Last 3 Encounters:  11/17/23 118 lb (53.5 kg)  11/16/23 118 lb (53.5 kg)  08/27/23 121 lb (54.9 kg)        Assessment & Plan:   Problem List Items Addressed This Visit   None Visit Diagnoses       COVID-19    -  Primary   Relevant Medications   nirmatrelvir /ritonavir  (PAXLOVID ) 20 x 150 MG & 10 x 100MG  TABS      Covid  discussed pros/cons of meds  pt would like to take paxlovid .  Also, pred 40mg  daily for 5 days d/t congestion.  Zofran  for n/v.  Tessalon  perles.  Symptomatic tx.  Discussed masking and cleaning surfaces to try to avoid xmission to spouse.  Pt will cancel PT appts this week  Meds ordered this encounter  Medications   ondansetron  (ZOFRAN ) 4 MG tablet    Sig: Take 1 tablet (4 mg total) by mouth every 8 (eight) hours as needed for nausea or vomiting.    Dispense:  20 tablet    Refill:  0   predniSONE  (DELTASONE ) 20 MG tablet    Sig: Take 2 tablets (40 mg total) by mouth daily with breakfast for 5 days.    Dispense:  10 tablet    Refill:  0   benzonatate  (TESSALON  PERLES) 100 MG capsule    Sig: Take 1 capsule (100 mg total) by mouth 3 (three) times daily as needed.    Dispense:  20 capsule    Refill:  0   nirmatrelvir /ritonavir  (PAXLOVID ) 20 x 150 MG & 10 x 100MG  TABS    Sig: Take 3 tablets by mouth 2 (two) times daily for 5 days. (Take nirmatrelvir  150 mg two tablets twice daily for 5 days and ritonavir  100 mg one tablet twice daily for 5 days) Patient GFR is >60    Dispense:  30 tablet    Refill:  0    I discussed the assessment and treatment plan with the patient. The patient was provided an opportunity to ask questions and all were answered. The patient agreed with the plan and demonstrated an understanding of the instructions.   The patient was advised to call back or seek an in-person evaluation if the symptoms worsen or if the condition fails to improve as anticipated.  Return if symptoms worsen or fail to improve.  Jenkins CHRISTELLA Carrel, MD [facility] [phone] (phone) [phone] (fax)  [facility]

## 2023-11-23 NOTE — Patient Instructions (Addendum)
 Hold the atorvastatin while on paxlovid  Ondasterone for nausea  Tylenol for pain  Tessalon perles if needed for cough Prednisone  Only take 1 sleeping pill if taking the paxlovid

## 2023-11-23 NOTE — Telephone Encounter (Signed)
 Copied from CRM [id number]. Topic: Clinical - Medication Question >> Nov 23, 2023 11:25 AM Adelina Mings wrote: Reason for CRM: wanted to know if she was going to be prescribed any medication

## 2023-11-24 ENCOUNTER — Ambulatory Visit: Payer: Self-pay | Admitting: Family Medicine

## 2023-11-24 NOTE — Telephone Encounter (Signed)
 Please see patient request for antibiotics for possible strep and advise

## 2023-11-24 NOTE — Telephone Encounter (Signed)
 Chief Complaint: Covid positive with sore throat Symptoms: sore throat Frequency: constant Disposition: [] ED /[] Urgent Care (no appt availability in office) / [] Appointment(In office/virtual)/ []  [place] [facility]/ [] Home Care/ [] Refused Recommended Disposition /[] [facility]/ [x]  Follow-up with PCP Additional Notes: Patient called in stating she had virtual appointment yesterday and confirmed positive home test for Covid. Patient stated on the virtual visit yesterday, HCP stated she could possibly have strep throat based off of her symptoms. Patient states she is feeling considerably worse today and her throat is feeling very rough, compares it to the exact same way it felt when she has had strep throat in the past. Patient does not want to drive in to office to be evaluated due to how bad she is feeling. Patient is requesting antibiotic to be called in to treat potential strep throat. Patient is kindly asking for a call back to her primary line [phone] to let her know if an antibiotic will be called in or not.    Copied from CRM [id number]. Topic: Appointments - Appointment Scheduling >> Nov 24, 2023  4:15 PM Tonda B wrote: Patient/patient representative is calling to schedule an appointment. Patient has tested positive for covid Refer to attachments for appointment information. Reason for Disposition  [1] COVID-19 diagnosed by doctor (or NP/PA) AND [2] mild symptoms (e.g., cough, fever, others) AND [3] no complications or SOB  Answer Assessment - Initial Assessment Questions 1. COVID-19 DIAGNOSIS: How do you know that you have COVID? (e.g., positive lab test or self-test, diagnosed by doctor or NP/PA, symptoms after exposure).     Yes 3. ONSET: When did the COVID-19 symptoms start?      Started with extreme diarrhea and vomiting on Saturday 4. WORST SYMPTOM: What is your worst symptom? (e.g., cough, fever, shortness of breath, muscle aches)     Sore throat, headache,  full ears, body ache 5. COUGH: Do you have a cough? If Yes, ask: How bad is the cough?       Mild cough  6. FEVER: Do you have a fever? If Yes, ask: What is your temperature, how was it measured, and when did it start?     102 - took it a couple of hours ago 7. RESPIRATORY STATUS: Describe your breathing? (e.g., normal; shortness of breath, wheezing, unable to speak)      Not worse than normal 8. BETTER-SAME-WORSE: Are you getting better, staying the same or getting worse compared to yesterday?  If getting worse, ask, In what way?     worse 9. OTHER SYMPTOMS: Do you have any other symptoms?  (e.g., chills, fatigue, headache, loss of smell or taste, muscle pain, sore throat)     Body aches, full ears, sore throat, runny nose  Protocols used: Coronavirus (COVID-19) Diagnosed or Suspected-A-AH

## 2023-11-25 ENCOUNTER — Ambulatory Visit: Payer: Self-pay | Admitting: Family Medicine

## 2023-11-25 ENCOUNTER — Other Ambulatory Visit: Payer: Self-pay | Admitting: Family Medicine

## 2023-11-25 ENCOUNTER — Ambulatory Visit: Payer: Medicare Other | Admitting: Family Medicine

## 2023-11-25 MED ORDER — PENICILLIN V POTASSIUM 500 MG PO TABS: 500.0000 mg | ORAL_TABLET | Freq: Three times a day (TID) | ORAL | 0 refills | Status: AC

## 2023-11-25 NOTE — Telephone Encounter (Signed)
 Patient notified

## 2023-11-25 NOTE — Telephone Encounter (Signed)
 Spoke with patient, advise to schedule an office visit for assesment  Patient stated she was seen by Dr Waldo Guitar on Monday and will like to schedule with her today if is possible No appt available till Friday  Please advise

## 2023-11-25 NOTE — Telephone Encounter (Addendum)
 Copied from CRM [id number]. Topic: Clinical - Medical Advice >> Nov 25, 2023  9:23 AM Lenon Radar A wrote: Reason for CRM: Patient reached out regarding request for antibiotic for positive COVID and strep throat. Patient was assured a phone call back last night and did not receive a phone call. Please contact patient as soon as possible. Patient can be reached at [phone].

## 2023-11-27 ENCOUNTER — Ambulatory Visit: Payer: Medicare Other | Admitting: Family Medicine

## 2023-12-03 DIAGNOSIS — M961 Postlaminectomy syndrome, not elsewhere classified: Secondary | ICD-10-CM | POA: Diagnosis not present

## 2023-12-03 DIAGNOSIS — M47812 Spondylosis without myelopathy or radiculopathy, cervical region: Secondary | ICD-10-CM | POA: Diagnosis not present

## 2023-12-03 DIAGNOSIS — G894 Chronic pain syndrome: Secondary | ICD-10-CM | POA: Diagnosis not present

## 2023-12-03 DIAGNOSIS — M47816 Spondylosis without myelopathy or radiculopathy, lumbar region: Secondary | ICD-10-CM | POA: Diagnosis not present

## 2023-12-09 ENCOUNTER — Telehealth: Payer: Self-pay | Admitting: Family Medicine

## 2023-12-09 NOTE — Telephone Encounter (Signed)
Please advise

## 2023-12-09 NOTE — Telephone Encounter (Unsigned)
Copied from CRM [id number]. 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 Patient is summoned to come in March 10th but they need to the excuse letter prior as soon as possible.

## 2023-12-10 DIAGNOSIS — S61451A Open bite of right hand, initial encounter: Secondary | ICD-10-CM | POA: Diagnosis not present

## 2023-12-10 NOTE — Telephone Encounter (Signed)
Patient notified not required to serve jury duty due to her age. She does not need a letter for a medical excuse. She can write a letter stating she would like to be excused due to being older than 72.  Verbalize understanding

## 2023-12-10 NOTE — Telephone Encounter (Signed)
She is not required to serve jury duty due to her age. She does not need a letter for a medical excuse. She can write a letter stating she would like to be excused due to being older than 72.  Patricia Underwood. Jimmey Ralph, MD 12/10/2023 11:58 AM

## 2023-12-11 DIAGNOSIS — M62830 Muscle spasm of back: Secondary | ICD-10-CM | POA: Diagnosis not present

## 2023-12-11 DIAGNOSIS — M961 Postlaminectomy syndrome, not elsewhere classified: Secondary | ICD-10-CM | POA: Diagnosis not present

## 2023-12-11 DIAGNOSIS — G894 Chronic pain syndrome: Secondary | ICD-10-CM | POA: Diagnosis not present

## 2023-12-11 DIAGNOSIS — M47816 Spondylosis without myelopathy or radiculopathy, lumbar region: Secondary | ICD-10-CM | POA: Diagnosis not present

## 2023-12-11 DIAGNOSIS — M6281 Muscle weakness (generalized): Secondary | ICD-10-CM | POA: Diagnosis not present

## 2023-12-16 DIAGNOSIS — G894 Chronic pain syndrome: Secondary | ICD-10-CM | POA: Diagnosis not present

## 2023-12-16 DIAGNOSIS — M6281 Muscle weakness (generalized): Secondary | ICD-10-CM | POA: Diagnosis not present

## 2023-12-16 DIAGNOSIS — M961 Postlaminectomy syndrome, not elsewhere classified: Secondary | ICD-10-CM | POA: Diagnosis not present

## 2023-12-16 DIAGNOSIS — M47816 Spondylosis without myelopathy or radiculopathy, lumbar region: Secondary | ICD-10-CM | POA: Diagnosis not present

## 2023-12-16 DIAGNOSIS — M62830 Muscle spasm of back: Secondary | ICD-10-CM | POA: Diagnosis not present

## 2023-12-25 ENCOUNTER — Ambulatory Visit (INDEPENDENT_AMBULATORY_CARE_PROVIDER_SITE_OTHER): Payer: Medicare Other | Admitting: Family Medicine

## 2023-12-25 ENCOUNTER — Encounter: Payer: Self-pay | Admitting: Family Medicine

## 2023-12-25 ENCOUNTER — Ambulatory Visit: Payer: Self-pay | Admitting: Family Medicine

## 2023-12-25 VITALS — BP 139/85 | HR 96 | Temp 97.1°F | Resp 16 | Ht 62.0 in | Wt 117.2 lb

## 2023-12-25 DIAGNOSIS — M5442 Lumbago with sciatica, left side: Secondary | ICD-10-CM

## 2023-12-25 DIAGNOSIS — M5441 Lumbago with sciatica, right side: Secondary | ICD-10-CM | POA: Diagnosis not present

## 2023-12-25 DIAGNOSIS — G8929 Other chronic pain: Secondary | ICD-10-CM | POA: Diagnosis not present

## 2023-12-25 MED ORDER — KETOROLAC TROMETHAMINE 60 MG/2ML IM SOLN
60.0000 mg | Freq: Once | INTRAMUSCULAR | Status: AC
  Administered 2023-12-25: 60 mg via INTRAMUSCULAR

## 2023-12-25 MED ORDER — KETOROLAC TROMETHAMINE 10 MG PO TABS: 10.0000 mg | ORAL_TABLET | Freq: Four times a day (QID) | ORAL | 0 refills | Status: DC | PRN

## 2023-12-25 NOTE — Progress Notes (Signed)
Subjective:     Patient ID: Patricia Underwood, female    DOB: 1951-04-24, 73 y.o.   MRN: [id number]  Chief Complaint  Patient presents with   Back Pain    Lower back pain from a fall on Tuesday that radiates to hips    HPI Fell by dog(75#).  Foot got stuck in debris and fell backwards.  H/o L4/5 fusion.  No LOC.  Hit head. Back and hips worse.  R>L.  Discussed the use of AI scribe software for clinical note transcription with the patient, who gave verbal consent to proceed.  History of Present Illness   Patricia Underwood is a 73 year old female with back and hip issues who presents after a fall.  On December 22, 2023, she fell while walking her 75-pound dog, tripping over debris and falling backwards. She did not hit her head or lose consciousness. Since the fall, she has experienced increased pain in her back and right hip, areas of chronic concern due to her history of L4-L5 issues and hip problems. No current sciatica, but she has experienced it in the past.  She has a history of osteoporosis and is currently taking Prolia. Her medications include Percocet for pain management, prednisone for arthritis, a statin, vitamin D, B12 injections, and triazolam for insomnia. Since the fall, she has been taking more Percocet than prescribed, sometimes up to three every four hours, although she is supposed to take one to two every four hours. She has allergies to Ambien, Flexeril, amlodipine, and naproxen. There was a mention of an allergy to Toradol, but she reports having received it without issue in the recent past.  she sees pain mgmt later next wk, but may run out of meds before then  She has a history of surgeries on her shoulder following a fall on black ice in [place]. She has received steroid injections in her hips, most recently in September 2024, due to sciatica. She attends physical therapy for chronic pain management, although she finds it difficult to perform some exercises.  Her social  situation is challenging as she is new to the area, has no local support, and her husband is on full-time oxygen and unable to assist her. Her children live on the [address], and her sister is in [place], [place]. She feels isolated and is responsible for walking her dog, which exacerbates her pain.       Health Maintenance Due  Topic Date Due   MAMMOGRAM  11/05/2020    Past Medical History:  Diagnosis Date   Aortic atherosclerosis (HCC)    Arthritis    B12 deficiency, Rx B12 injections 07/05/2017   Chronic back pain    Chronic right shoulder pain 05/30/2017   Coronary artery calcification seen on CAT scan    Diverticulitis    Diverticulosis    Esophageal spasm    Fibromyalgia    HLD (hyperlipidemia) 05/30/2017   HTN (hypertension)    Torn meniscus    right    Past Surgical History:  Procedure Laterality Date   ABDOMINAL HYSTERECTOMY     LIGAMENT REPAIR Left 12/15/2022   Thumb   LUMBAR FUSION     L4-L5   SHOULDER ARTHROSCOPY Right    TONSILLECTOMY AND ADENOIDECTOMY       Current Outpatient Medications:    atorvastatin (LIPITOR) 20 MG tablet, Take 1 tablet (20 mg total) by mouth daily., Disp: 90 tablet, Rfl: 3   benzonatate (TESSALON PERLES) 100 MG capsule, Take 1  capsule (100 mg total) by mouth 3 (three) times daily as needed., Disp: 20 capsule, Rfl: 0   cyanocobalamin (VITAMIN B12) 1000 MCG/ML injection, 1000 mcg (1 mg) injection once every other week, Disp: 6 mL, Rfl: 3   ketorolac (TORADOL) 10 MG tablet, Take 1 tablet (10 mg total) by mouth every 6 (six) hours as needed., Disp: 15 tablet, Rfl: 0   ondansetron (ZOFRAN) 4 MG tablet, Take 1 tablet (4 mg total) by mouth every 8 (eight) hours as needed for nausea or vomiting., Disp: 20 tablet, Rfl: 0   oxyCODONE-acetaminophen (PERCOCET/ROXICET) 5-325 MG tablet, Take 1 tablet by mouth every 6 (six) hours as needed for severe pain., Disp: 90 tablet, Rfl: 0   predniSONE (DELTASONE) 5 MG tablet, Take 2 tablets (10 mg  total) by mouth daily with breakfast for 14 days, THEN 1 tablet (5 mg total) daily with breakfast., Disp: 104 tablet, Rfl: 0   triazolam (HALCION) 0.25 MG tablet, Take 2 tablets (0.5 mg total) by mouth at bedtime as needed for sleep., Disp: 60 tablet, Rfl: 5   Vitamin D, Ergocalciferol, (DRISDOL) 1.25 MG (50000 UNIT) CAPS capsule, TAKE 1 CAPSULE BY MOUTH EVERY 7 DAYS, Disp: 12 capsule, Rfl: 3  Current Facility-Administered Medications:    denosumab (PROLIA) injection 60 mg, 60 mg, Subcutaneous, Once, Ardith Dark, MD   Melene Muller ON 04/05/2024] denosumab (PROLIA) injection 60 mg, 60 mg, Subcutaneous, Once, Ardith Dark, MD  Allergies  Allergen Reactions   Ambien [Zolpidem Tartrate]    Flexeril [Cyclobenzaprine] Other (See Comments)    Patient unsure of reaction   Norvasc [Amlodipine] Swelling    Edema of feet and ankles    Naproxen Other (See Comments)    Patient unsure of reaction.   ROS neg/noncontributory except as noted HPI/below      Objective:     BP 139/85   Pulse 96   Temp (!) 97.1 F (36.2 C) (Temporal)   Resp 16   Ht 5\' 2"  (1.575 m)   Wt 117 lb 4 oz (53.2 kg)   SpO2 98%   BMI 21.45 kg/m  Wt Readings from Last 3 Encounters:  12/25/23 117 lb 4 oz (53.2 kg)  11/17/23 118 lb (53.5 kg)  11/16/23 118 lb (53.5 kg)    Physical Exam   Gen: WDWN NAD HEENT: NCAT, conjunctiva not injected, sclera nonicteric ABDOMEN:  BS+, soft, NTND, No HSM, no masses EXT:  no edema MSK: scoliosis.  Some TTP BSI joints.  MS 5/5 BLE.  Walking fair   some muscle spasm lower back. Can stand on heels/toes/1 leg  NEURO: A&O x3.  CN II-XII intact.  PSYCH: normal mood. Good eye contact     Assessment & Plan:  Chronic midline low back pain with bilateral sciatica -     Ketorolac Tromethamine  Other orders -     Ketorolac Tromethamine; Take 1 tablet (10 mg total) by mouth every 6 (six) hours as needed.  Dispense: 15 tablet; Refill: 0  Assessment and Plan    Acute exacerbation of  chronic low back pain and right hip pain   A fall on December 22, 2023, worsened existing low back and right hip pain, with a history of L4-L5 issues and chronic hip pain previously managed with steroid injections. Pain severity has increased, necessitating higher doses of Percocet. Physical examination showed no apparent concern for acute fractures. Social isolation and caregiving responsibilities complicate pain management. Toradol was discussed for immediate relief, with short-term use advised. Increasing prednisone for  a short burst is considered if needed. Suggested hiring a dog walker for assistance. Administer Toradol injection and prescribe Toradol pills for a few days. Contact the pain management clinic to discuss medication needs and inform them of the fall. Follow up with a pain management appointment on Thursday. pt didn't think x-rays were necessary.  just concerned about controlling pain  Chronic pain management   Currently on Percocet for chronic pain, with increased doses since the fall. Also taking prednisone for arthritis and has received steroid injections for hip pain. Discussed a potential short burst of increased prednisone. Emphasized contacting pain management for adjustments. Continue the current pain management regimen and discuss potential prednisone increase with the pain management team. Monitor pain levels and adjust treatment as necessary.  Osteoporosis   On Prolia for osteoporosis management, with no new fractures suspected from the recent fall. Continue Prolia as prescribed.  General Health Maintenance   Taking multiple medications, including a statin, vitamin D, B12 injections, and triazolam for insomnia. Allergies include Flexeril, Ambien, amlodipine, and naproxen. Updated allergy list to remove Toradol. Continue current medications.  Follow-up   Follow up with the pain management clinic on Thursday. Consider returning to the clinic on Monday if pain persists or  worsens.        Return if symptoms worsen or fail to improve.  Angelena Sole, MD

## 2023-12-25 NOTE — Telephone Encounter (Signed)
Copied from CRM [id number]. Topic: Clinical - Red Word Triage >> Dec 25, 2023  8:34 AM Gurney Maxin H wrote: [facility] that prompted transfer to Nurse Triage: Patient states she slipped and fell a couple days ago and hurt her back.    Chief Complaint: Fall Symptoms: back pain Disposition: [] ED /[] Urgent Care (no appt availability in office) / [x] Appointment(In office/virtual)/ []  [facility]/ [] Home Care/ [] Refused Recommended Disposition /[] [place] [facility]/ []  Follow-up with PCP Additional Notes: Patient stated she fell 2 days ago while walking her dog. She is having back pain and rates it 7/10. Patient is still able to walk okay with her cane. Patient requesting to come in today for an x-ray.  Contacted clinic access line to confirm if patient can be scheduled for an office visit and have an xray today. Confirmation received. Appointment scheduled.    Reason for Disposition  [1] MODERATE back pain (e.g., interferes with normal activities) AND [2] present > 3 days  Answer Assessment - Initial Assessment Questions 1. MECHANISM: "How did the fall happen?"     Walking her dog and her foot got caught in a brush  2. DOMESTIC VIOLENCE AND ELDER ABUSE SCREENING: "Did you fall because someone pushed you or tried to hurt you?" If Yes, ask: "Are you safe now?"     N/A  3. ONSET: "When did the fall happen?" (e.g., minutes, hours, or days ago)     2 days ago  4. LOCATION: "What part of the body hit the ground?" (e.g., back, buttocks, head, hips, knees, hands, head, stomach)     Back  5. INJURY: "Did you hurt (injure) yourself when you fell?" If Yes, ask: "What did you injure? Tell me more about this?" (e.g., body area; type of injury; pain severity)"     Back was hurting, but no visible injuries  6. PAIN: "Is there any pain?" If Yes, ask: "How bad is the pain?" (e.g., Scale 1-10; or mild,  moderate, severe)   - NONE (0): No pain   - MILD (1-3): Doesn't interfere with normal  activities    - MODERATE (4-7): Interferes with normal activities or awakens from sleep    - SEVERE (8-10): Excruciating pain, unable to do any normal activities      7/10  Answer Assessment - Initial Assessment Questions 1. ONSET: "When did the pain begin?"      2 days ago  2. SEVERITY: "How bad is the pain?"  (e.g., Scale 1-10; mild, moderate, or severe)   - MILD (1-3): Doesn't interfere with normal activities.    - MODERATE (4-7): Interferes with normal activities or awakens from sleep.    - SEVERE (8-10): Excruciating pain, unable to do any normal activities.      7/10  3. PATTERN: "Is the pain constant?" (e.g., yes, no; constant, intermittent)      Constant  4. CAUSE:  "What do you think is causing the back pain?"      Hx of back pain, but worsened after fall  Protocols used: Falls and Falling-A-AH, Back Pain-A-AH

## 2023-12-25 NOTE — Patient Instructions (Signed)
Toradol snt Call pain mgmt

## 2023-12-25 NOTE — Telephone Encounter (Signed)
Noted. Appointment scheduled.

## 2023-12-28 ENCOUNTER — Encounter: Payer: Self-pay | Admitting: Family Medicine

## 2023-12-28 ENCOUNTER — Telehealth: Payer: Self-pay | Admitting: Family Medicine

## 2023-12-28 ENCOUNTER — Other Ambulatory Visit: Payer: Self-pay

## 2023-12-28 MED ORDER — PREDNISONE 20 MG PO TABS: ORAL_TABLET | ORAL | 0 refills | Status: DC

## 2023-12-28 NOTE — Telephone Encounter (Signed)
 Rx sent to pharmacy

## 2023-12-28 NOTE — Telephone Encounter (Signed)
Can you please review pt msg regarding starting Prednisone and advise Rx

## 2023-12-28 NOTE — Telephone Encounter (Signed)
Patient seen de Patricia Underwood and they discussed prednisone for patient back pain now want's to move forward with the medication patient would like a call back regarding this

## 2023-12-28 NOTE — Telephone Encounter (Unsigned)
Copied from CRM [id number]. Topic: Clinical - Medication Refill >> Dec 28, 2023  8:28 AM Aletta Edouard wrote: Most Recent Primary Care Visit:  Provider: Lutricia Horsfall MARIE  Department: [facility]  Visit Type: ACUTE  Date: 12/25/2023  Medication: prednisone   Has the patient contacted their pharmacy? No (Agent: If no, request that the patient contact the pharmacy for the refill. If patient does not wish to contact the pharmacy document the reason why and proceed with request.) (Agent: If yes, when and what did the pharmacy advise?)  Is this the correct pharmacy for this prescription? Yes If no, delete pharmacy and type the correct one.  This is the patient's preferred pharmacy:  [facility] [id number] [place], [place] - [address] [place] [address] Phone: [phone] Fax: [phone]   Has the prescription been filled recently? No  Is the patient out of the medication? No  Has the patient been seen for an appointment in the last year OR does the patient have an upcoming appointment? Yes  Can we respond through MyChart? No  Agent: Please be advised that Rx refills may take up to 3 business days. We ask that you follow-up with your pharmacy.

## 2023-12-31 DIAGNOSIS — G894 Chronic pain syndrome: Secondary | ICD-10-CM | POA: Diagnosis not present

## 2023-12-31 DIAGNOSIS — M47816 Spondylosis without myelopathy or radiculopathy, lumbar region: Secondary | ICD-10-CM | POA: Diagnosis not present

## 2023-12-31 DIAGNOSIS — M961 Postlaminectomy syndrome, not elsewhere classified: Secondary | ICD-10-CM | POA: Diagnosis not present

## 2023-12-31 DIAGNOSIS — M47812 Spondylosis without myelopathy or radiculopathy, cervical region: Secondary | ICD-10-CM | POA: Diagnosis not present

## 2024-01-05 NOTE — Progress Notes (Deleted)
 Office Visit Note  Patient: Patricia Underwood             Date of Birth: Jun 26, 1951           MRN: [id number]             PCP: Ardith Dark, MD Referring: Ardith Dark, MD Visit Date: 01/18/2024   Subjective:  No chief complaint on file.   History of Present Illness: Patricia Underwood is a 73 y.o. female here for follow up for rheumatoid arthritis and sicca syndrome on prednisone 10 mg daily.    Previous HPI 11/16/2023 Patricia Underwood is a 73 y.o. female here for follow up for rheumatoid arthritis and sicca syndrome on prednisone 5 mg daily.  She presents with a recent exacerbation of symptoms. They report increased swelling and pain in their hands, particularly in the left hand, which they have been using more due to an injury to the right hand. The patient describes the swelling as significant, preventing them from wearing rings, and the pain as constant. They also note that the cold weather exacerbates the discomfort.   The patient has been on prednisone 5 mg daily, which they take at night. She tried taking gabapentin that did not help. They also mention a previous trial of meloxicam, which did not provide any additional benefit.   In addition to the arthritis, the patient is recovering from an avulsion injury to the right hand caused by a dog bite. The injury involved the nail bed and a piece of the finger, leading to concerns about future range of motion. The patient has been advised to frequently bend the finger to prevent stiffness and loss of mobility.   The patient's arthritis and hand injury have been causing significant discomfort and functional impairment, prompting them to seek further treatment options.         Previous HPI 08/19/2023 Patricia Underwood is a 73 y.o. female here for follow up for rheumatoid arthritis and sicca syndrome on prednisone 5 mg daily.  Her main complaint is increase in swelling pain and difficulty using her left hand.  This is problematic as it is her primary hand.   She had previous surgery for the tendon rupture that healed up well she saw Dr. Denese Killings with hand surgery had x-ray showing advanced [facility] joint osteoarthritis.  She is noticing swelling mostly around the MCP joints and more distally distributed throughout her hand, typically gets worse after prolonged use and activity.  Is not having any symptoms of numbness or weakness.  Does not see any associated rash or skin change.   Previous HPI 05/19/2023 Patricia Underwood is a 73 y.o. female here for follow up follow-up for rheumatoid arthritis and sicca syndrome.  Since her last visit she is doing better with healing from her hand surgery still has ongoing daily joint pain and stiffness in multiple areas.  Not seeing a lot of visible swelling redness or warmth in affected sites.  Did not notice appreciable difference with trial of resuming the previously prescribed duloxetine.   Previous HPI 01/05/23 Patricia Underwood is a 73 y.o. female here for follow up for rheumatoid arthritis and sicca syndrome.  Evaluation at last visit without much peripheral joint synovitis noted recommended starting duloxetine for osteoarthritis related pain.  Shortly after starting medication she noticed new headaches so she stopped taking it and these headaches resolved.  She also started taking the pilocarpine again but she already had medicine on hand this is partially helpful and  not noticing any major side effect.  Hand surgery for the extensor tendon rupture with Dr. Mina Marble when okay she is currently wearing immobilizing brace which causes a lot of difficulty as she is left-hand dominant.  She is also been recommended to avoid a lot of walking around because of previous falls incurring hand and wrist injuries.  She notices increased back and bilateral leg pain with her decrease in walking and overall physical activity.  No definite flareup of joint swelling.  Still concerned about the bony nodules on her fingers and feet.    Previous  HPI 11/24/22 Patricia Underwood is a 73 y.o. female here for rheumatoid arthritis and sicca syndrome. She has a history of chronic joint pain in multiple areas with known  osteoarthritis and with fibromyalgia syndrome. There has been some question of sjogren syndrome due to ongoing issues with dry mouth and accelerated dental decay but with negative serology. She saw Dr. Sharmon Revere last year for evaluation findings showing no objective peripheral joint synovitis and serum inflammatory markers and RA related antibodies were negative. She has been treated with intermittent prednisone which works well but the symptoms return when off treatment. More recently sustained a fall with associated wrist fracture and rupture of EPL tendon seeing hand surgery for this.  She does have somewhat generalized joint pains and morning stiffness lasting up to about 1 hour.  Typically does not see any obvious joint swelling or effusions.  She is not able to safely take oral nonsteroidal anti-inflammatory drugs. Dry mouth symptoms are pretty bothersome and she has experienced change in taste along with chronic irritation of the tongue.  She has been prescribed pilocarpine previously but did not take the medicine very consistently due to somewhat cost prohibitive and also had increased sweating side effects.  She does not have any particular history with inflammatory eye complications does not generally notice lymphadenopathy or any swelling in the area near major salivary glands.   No Rheumatology ROS completed.   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   Essential hypertension 08/14/2022   Chronic low back pain with sciatica 09/25/2021   Cervical radiculopathy 09/25/2021   Alopecia 09/25/2021   Short-term memory loss 07/23/2021   Rheumatoid arthritis (HCC) 03/28/2021   Migraine 03/28/2021   Knee pain 02/14/2021    Esophageal spasm    Aortic atherosclerosis (HCC)    Coronary artery disease due to calcified coronary lesion 07/30/2020   IGT (impaired glucose tolerance) 03/20/2020   Chronic pain syndrome 07/04/2019   Dysphasia 04/21/2019   Sicca syndrome (HCC) 04/21/2019   Dental caries 04/21/2019   Osteopenia 10/25/2018   Fibromyalgia 09/11/2018   Sensorineural hearing loss (SNHL) of both ears, followed by [facility] [place] 07/20/2018   Vitamin D deficiency 07/05/2017   B12 deficiency, Rx B12 injections 07/05/2017   Insomnia 07/05/2017   Chronic right shoulder pain 05/30/2017   HLD (hyperlipidemia) 05/30/2017    Past Medical History:  Diagnosis Date   Aortic atherosclerosis (HCC)    Arthritis    B12 deficiency, Rx B12 injections 07/05/2017   Chronic back pain    Chronic right shoulder pain 05/30/2017   Coronary artery calcification seen on CAT scan    Diverticulitis    Diverticulosis    Esophageal spasm    Fibromyalgia    HLD (hyperlipidemia) 05/30/2017   HTN (hypertension)    Torn meniscus  right    Family History  Problem Relation Age of Onset   CAD Mother        died of massive MI at 43   Heart attack Mother    CVA Father        x 2   Colon cancer Father    Hyperlipidemia Sister    Stroke Paternal Grandfather    Hypothyroidism Daughter    Hyperthyroidism Son    Diabetes Neg Hx    Past Surgical History:  Procedure Laterality Date   ABDOMINAL HYSTERECTOMY     LIGAMENT REPAIR Left 12/15/2022   Thumb   LUMBAR FUSION     L4-L5   SHOULDER ARTHROSCOPY Right    TONSILLECTOMY AND ADENOIDECTOMY     Social History   Social History Narrative   Not on file   Immunization History  Administered Date(s) Administered   Influenza-Unspecified 11/10/2018   Moderna Covid-19 Fall Seasonal Vaccine 68yrs & older 12/01/2022   PFIZER(Purple Top)SARS-COV-2 Vaccination 12/13/2019, 01/03/2020, 08/07/2020, 05/30/2021, 09/06/2021   Tdap 05/31/2013, 03/30/2022, 10/08/2023      Objective: Vital Signs: There were no vitals taken for this visit.   Physical Exam   Musculoskeletal Exam: ***  CDAI Exam: CDAI Score: -- Patient Global: --; Provider Global: -- Swollen: --; Tender: -- Joint Exam 01/18/2024   No joint exam has been documented for this visit   There is currently no information documented on the homunculus. Go to the Rheumatology activity and complete the homunculus joint exam.  Investigation: No additional findings.  Imaging: No results found.  Recent Labs: Lab Results  Component Value Date   WBC 10.0 08/27/2023   HGB 13.5 08/27/2023   PLT 504.0 (H) 08/27/2023   NA 139 08/27/2023   K 3.7 08/27/2023   CL 100 08/27/2023   CO2 28 08/27/2023   GLUCOSE 141 (H) 08/27/2023   BUN 16 08/27/2023   CREATININE 0.57 08/27/2023   BILITOT 0.5 08/27/2023   ALKPHOS 57 08/27/2023   AST 23 08/27/2023   ALT 21 08/27/2023   PROT 6.7 08/27/2023   ALBUMIN 4.3 08/27/2023   CALCIUM 9.4 08/27/2023    Speciality Comments: No specialty comments available.  Procedures:  No procedures performed Allergies: Ambien [zolpidem tartrate], Flexeril [cyclobenzaprine], Norvasc [amlodipine], and Naproxen   Assessment / Plan:     Visit Diagnoses: No diagnosis found.  ***  Orders: No orders of the defined types were placed in this encounter.  No orders of the defined types were placed in this encounter.    Follow-Up Instructions: No follow-ups on file.   Metta Clines, RT  Note - This record has been created using [employer].  Chart creation errors have been sought, but may not always  have been located. Such creation errors do not reflect on  the standard of medical care.

## 2024-01-06 DIAGNOSIS — M7061 Trochanteric bursitis, right hip: Secondary | ICD-10-CM | POA: Diagnosis not present

## 2024-01-07 DIAGNOSIS — G894 Chronic pain syndrome: Secondary | ICD-10-CM | POA: Diagnosis not present

## 2024-01-07 DIAGNOSIS — M6281 Muscle weakness (generalized): Secondary | ICD-10-CM | POA: Diagnosis not present

## 2024-01-07 DIAGNOSIS — M47816 Spondylosis without myelopathy or radiculopathy, lumbar region: Secondary | ICD-10-CM | POA: Diagnosis not present

## 2024-01-07 DIAGNOSIS — M961 Postlaminectomy syndrome, not elsewhere classified: Secondary | ICD-10-CM | POA: Diagnosis not present

## 2024-01-07 DIAGNOSIS — M62830 Muscle spasm of back: Secondary | ICD-10-CM | POA: Diagnosis not present

## 2024-01-13 DIAGNOSIS — M62830 Muscle spasm of back: Secondary | ICD-10-CM | POA: Diagnosis not present

## 2024-01-13 DIAGNOSIS — M961 Postlaminectomy syndrome, not elsewhere classified: Secondary | ICD-10-CM | POA: Diagnosis not present

## 2024-01-13 DIAGNOSIS — M47816 Spondylosis without myelopathy or radiculopathy, lumbar region: Secondary | ICD-10-CM | POA: Diagnosis not present

## 2024-01-13 DIAGNOSIS — M6281 Muscle weakness (generalized): Secondary | ICD-10-CM | POA: Diagnosis not present

## 2024-01-13 DIAGNOSIS — G894 Chronic pain syndrome: Secondary | ICD-10-CM | POA: Diagnosis not present

## 2024-01-14 ENCOUNTER — Other Ambulatory Visit: Payer: Self-pay | Admitting: Internal Medicine

## 2024-01-14 DIAGNOSIS — M0609 Rheumatoid arthritis without rheumatoid factor, multiple sites: Secondary | ICD-10-CM

## 2024-01-14 DIAGNOSIS — M8580 Other specified disorders of bone density and structure, unspecified site: Secondary | ICD-10-CM

## 2024-01-14 MED ORDER — PREDNISONE 5 MG PO TABS: 5.0000 mg | ORAL_TABLET | Freq: Every day | ORAL | 0 refills | Status: DC

## 2024-01-14 NOTE — Telephone Encounter (Signed)
 Last Fill: 11/16/2023  Next Visit: 03/02/2024  Last Visit: 11/16/2023  Dx: Rheumatoid arthritis of multiple sites with negative rheumatoid factor   Current Dose per office note on 11/16/2023: Currently on Prednisone 5mg  daily, which is not providing sufficient relief. -Increase Prednisone to 10mg  daily for two weeks to assess for symptom improvement.  Okay to refill Prednisone?

## 2024-01-14 NOTE — Telephone Encounter (Signed)
 Patient contacted the office to request a medication refill.   1. Name of Medication: Prednisone  2. How are you currently taking this medication (dosage and times per day)? 2 a day 20 MG   3. What pharmacy would you like for that to be sent to? [facility] [address]

## 2024-01-18 ENCOUNTER — Ambulatory Visit: Attending: Internal Medicine | Admitting: Internal Medicine

## 2024-01-18 ENCOUNTER — Ambulatory Visit: Payer: Medicare Other | Admitting: Internal Medicine

## 2024-01-18 ENCOUNTER — Encounter: Payer: Self-pay | Admitting: Internal Medicine

## 2024-01-18 VITALS — BP 143/83 | HR 84 | Resp 14 | Ht 62.0 in | Wt 115.0 lb

## 2024-01-18 DIAGNOSIS — M25551 Pain in right hip: Secondary | ICD-10-CM | POA: Insufficient documentation

## 2024-01-18 DIAGNOSIS — M0609 Rheumatoid arthritis without rheumatoid factor, multiple sites: Secondary | ICD-10-CM | POA: Diagnosis present

## 2024-01-18 DIAGNOSIS — G8929 Other chronic pain: Secondary | ICD-10-CM | POA: Insufficient documentation

## 2024-01-18 DIAGNOSIS — M797 Fibromyalgia: Secondary | ICD-10-CM | POA: Diagnosis present

## 2024-01-18 DIAGNOSIS — T148XXA Other injury of unspecified body region, initial encounter: Secondary | ICD-10-CM

## 2024-01-18 DIAGNOSIS — Z7952 Long term (current) use of systemic steroids: Secondary | ICD-10-CM

## 2024-01-18 DIAGNOSIS — M19042 Primary osteoarthritis, left hand: Secondary | ICD-10-CM | POA: Diagnosis not present

## 2024-01-18 DIAGNOSIS — G894 Chronic pain syndrome: Secondary | ICD-10-CM

## 2024-01-18 DIAGNOSIS — M5440 Lumbago with sciatica, unspecified side: Secondary | ICD-10-CM | POA: Diagnosis not present

## 2024-01-18 DIAGNOSIS — M19041 Primary osteoarthritis, right hand: Secondary | ICD-10-CM | POA: Insufficient documentation

## 2024-01-18 DIAGNOSIS — M3500 Sicca syndrome, unspecified: Secondary | ICD-10-CM

## 2024-01-18 MED ORDER — PREDNISONE 5 MG PO TABS
ORAL_TABLET | ORAL | 2 refills | Status: DC
Start: 2024-01-18 — End: 2024-04-11

## 2024-01-18 NOTE — Patient Instructions (Signed)
 I recommend following up with a back specialist regarding your degenerative disc disease and sciatica. Dr. Christell Constant and Dr. Alvester Morin with [facility] may be good options for nonsurgical treatment of back pain and would have more tools available such as local injections.  We can stay on the slightly higher dose of prednisone for now. If back and hip problems improve more to baseline you can try reducing this to 1 or 1.5 tablets if tolerable.

## 2024-01-18 NOTE — Progress Notes (Signed)
 Office Visit Note  Patient: Patricia Underwood             Date of Birth: 08/05/1951           MRN: [id number]             PCP: Ardith Dark, MD Referring: Ardith Dark, MD Visit Date: 01/18/2024   Subjective:  Follow-up (Patient states she has an injection into her right hip two or three weeks ago and her hip continues to bother her. Patient states her physical therapist does not believe it is bursitis. )   Discussed the use of AI scribe software for clinical note transcription with the patient, who gave verbal consent to proceed.  History of Present Illness   Patricia Underwood is a 73 y.o. female here for follow up for rheumatoid arthritis and sicca syndrome, currently on prednisone 10 mg daily increased from 5 mg daily due to worsened symptoms.  She experiences increased pain in her right hip, which has been problematic recently. The pain is severe enough to make walking difficult, and she experiences shooting pain consistent with sciatica, which she has had previously due to back surgery. Over the weekend, her symptoms improved slightly, but she had to hire a dog walker due to her inability to walk her dog. She uses a cane for balance when walking due to nervousness about falling.  She has a history of receiving steroid injections in both hips, with the most recent injection targeting the right hip approximately 2 weeks ago, which did not provide the expected relief. She attends physical therapy, where she performs leg exercises and receives heat therapy on her back. The pain is located on the side of her hip, with some improvement over the weekend. She also notes weakness in her leg but no foot pain.  She uses prednisone daily, taking two tablets a day. She has tried using some kind of patches on her back, but found them ineffective and unpleasant due to their odor.  She has not had recent imaging of her hip but recalls an MRI from about a year and a half ago, which was done in a less than  ideal setting and did not reveal significant findings. She also mentions a previous MRI or ultrasound that suggested the presence of bone spurs. Xrays form 2023 demonstrated osteoarthritis.  She experiences dryness and irritation on her hands, which she attributes to frequent hand washing and scratching. She uses lubriderm as a moisturizer.    Previous HPI 11/16/2023 Patricia Underwood is a 73 y.o. female here for follow up for rheumatoid arthritis and sicca syndrome on prednisone 5 mg daily.  She presents with a recent exacerbation of symptoms. They report increased swelling and pain in their hands, particularly in the left hand, which they have been using more due to an injury to the right hand. The patient describes the swelling as significant, preventing them from wearing rings, and the pain as constant. They also note that the cold weather exacerbates the discomfort.   The patient has been on prednisone 5 mg daily, which they take at night. She tried taking gabapentin that did not help. They also mention a previous trial of meloxicam, which did not provide any additional benefit.   In addition to the arthritis, the patient is recovering from an avulsion injury to the right hand caused by a dog bite. The injury involved the nail bed and a piece of the finger, leading to concerns about future range of motion.  The patient has been advised to frequently bend the finger to prevent stiffness and loss of mobility.   The patient's arthritis and hand injury have been causing significant discomfort and functional impairment, prompting them to seek further treatment options.      08/19/2023 Patricia Underwood is a 73 y.o. female here for follow up for rheumatoid arthritis and sicca syndrome on prednisone 5 mg daily.  Her main complaint is increase in swelling pain and difficulty using her left hand.  This is problematic as it is her primary hand.  She had previous surgery for the tendon rupture that healed up well she  saw Dr. Denese Killings with hand surgery had x-ray showing advanced first [facility] joint osteoarthritis.  She is noticing swelling mostly around the MCP joints and more distally distributed throughout her hand, typically gets worse after prolonged use and activity.  Is not having any symptoms of numbness or weakness.  Does not see any associated rash or skin change.   05/19/2023 Patricia Underwood is a 73 y.o. female here for follow up follow-up for rheumatoid arthritis and sicca syndrome.  Since her last visit she is doing better with healing from her hand surgery still has ongoing daily joint pain and stiffness in multiple areas.  Not seeing a lot of visible swelling redness or warmth in affected sites.  Did not notice appreciable difference with trial of resuming the previously prescribed duloxetine.   01/05/23 Patricia Underwood is a 73 y.o. female here for follow up for rheumatoid arthritis and sicca syndrome.  Evaluation at last visit without much peripheral joint synovitis noted recommended starting duloxetine for osteoarthritis related pain.  Shortly after starting medication she noticed new headaches so she stopped taking it and these headaches resolved.  She also started taking the pilocarpine again but she already had medicine on hand this is partially helpful and not noticing any major side effect.  Hand surgery for the extensor tendon rupture with Dr. Mina Marble when okay she is currently wearing immobilizing brace which causes a lot of difficulty as she is left-hand dominant.  She is also been recommended to avoid a lot of walking around because of previous falls incurring hand and wrist injuries.  She notices increased back and bilateral leg pain with her decrease in walking and overall physical activity.  No definite flareup of joint swelling.  Still concerned about the bony nodules on her fingers and feet.    11/24/22 Patricia Underwood is a 73 y.o. female here for rheumatoid arthritis and sicca syndrome. She has a history of  chronic joint pain in multiple areas with known  osteoarthritis and with fibromyalgia syndrome. There has been some question of sjogren syndrome due to ongoing issues with dry mouth and accelerated dental decay but with negative serology. She saw Dr. Sharmon Revere last year for evaluation findings showing no objective peripheral joint synovitis and serum inflammatory markers and RA related antibodies were negative. She has been treated with intermittent prednisone which works well but the symptoms return when off treatment. More recently sustained a fall with associated wrist fracture and rupture of EPL tendon seeing hand surgery for this.  She does have somewhat generalized joint pains and morning stiffness lasting up to about 1 hour.  Typically does not see any obvious joint swelling or effusions.  She is not able to safely take oral nonsteroidal anti-inflammatory drugs. Dry mouth symptoms are pretty bothersome and she has experienced change in taste along with chronic irritation of the tongue.  She has been prescribed  pilocarpine previously but did not take the medicine very consistently due to somewhat cost prohibitive and also had increased sweating side effects.  She does not have any particular history with inflammatory eye complications does not generally notice lymphadenopathy or any swelling in the area near major salivary glands.   Review of Systems  Constitutional:  Negative for fatigue.  HENT:  Positive for mouth dryness. Negative for mouth sores.   Eyes:  Positive for dryness.  Respiratory:  Negative for shortness of breath.   Cardiovascular:  Negative for chest pain and palpitations.  Gastrointestinal:  Negative for blood in stool, constipation and diarrhea.  Endocrine: Negative for increased urination.  Genitourinary:  Negative for involuntary urination.  Musculoskeletal:  Positive for joint pain, joint pain, myalgias, morning stiffness, muscle tenderness and myalgias. Negative for gait  problem, joint swelling and muscle weakness.  Skin:  Negative for color change, rash, hair loss and sensitivity to sunlight.  Allergic/Immunologic: Negative for susceptible to infections.  Neurological:  Negative for dizziness and headaches.  Hematological:  Negative for swollen glands.  Psychiatric/Behavioral:  Positive for sleep disturbance. Negative for depressed mood. The patient is not nervous/anxious.     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   Essential hypertension 08/14/2022   Chronic low back pain with sciatica 09/25/2021   Cervical radiculopathy 09/25/2021   Alopecia 09/25/2021   Short-term memory loss 07/23/2021   Rheumatoid arthritis (HCC) 03/28/2021   Migraine 03/28/2021   Knee pain 02/14/2021   Esophageal spasm    Aortic atherosclerosis (HCC)    Coronary artery disease due to calcified coronary lesion 07/30/2020   IGT (impaired glucose tolerance) 03/20/2020   Chronic pain syndrome 07/04/2019   Dysphasia 04/21/2019   Sicca syndrome (HCC) 04/21/2019   Dental caries 04/21/2019   Osteopenia 10/25/2018   Fibromyalgia 09/11/2018   Sensorineural hearing loss (SNHL) of both ears, followed by [facility] [place] 07/20/2018   Vitamin D deficiency 07/05/2017   B12 deficiency, Rx B12 injections 07/05/2017   Insomnia 07/05/2017   Chronic right shoulder pain 05/30/2017   HLD (hyperlipidemia) 05/30/2017    Past Medical History:  Diagnosis Date   Aortic atherosclerosis (HCC)    Arthritis    B12 deficiency, Rx B12 injections 07/05/2017   Chronic back pain    Chronic right shoulder pain 05/30/2017   Coronary artery calcification seen on CAT scan    Diverticulitis    Diverticulosis    Esophageal spasm    Fibromyalgia    HLD (hyperlipidemia) 05/30/2017   HTN (hypertension)    Torn meniscus    right    Family  History  Problem Relation Age of Onset   CAD Mother        died of massive MI at 45   Heart attack Mother    CVA Father        x 2   Colon cancer Father    Hyperlipidemia Sister    Stroke Paternal Grandfather    Hypothyroidism Daughter    Hyperthyroidism Son    Diabetes Neg Hx    Past Surgical History:  Procedure Laterality Date   ABDOMINAL HYSTERECTOMY     LIGAMENT REPAIR Left 12/15/2022   Thumb   LUMBAR FUSION     L4-L5   SHOULDER ARTHROSCOPY Right    TONSILLECTOMY AND ADENOIDECTOMY     Social History  Social History Narrative   Not on file   Immunization History  Administered Date(s) Administered   Influenza-Unspecified 11/10/2018   Moderna Covid-19 Fall Seasonal Vaccine 1yrs & older 12/01/2022   PFIZER(Purple Top)SARS-COV-2 Vaccination 12/13/2019, 01/03/2020, 08/07/2020, 05/30/2021, 09/06/2021   Tdap 05/31/2013, 03/30/2022, 10/08/2023     Objective: Vital Signs: BP (!) 143/83 (BP Location: Left Arm, Patient Position: Sitting, Cuff Size: Normal)   Pulse 84   Resp 14   Ht 5\' 2"  (1.575 m)   Wt 115 lb (52.2 kg)   BMI 21.03 kg/m    Physical Exam Eyes:     Conjunctiva/sclera: Conjunctivae normal.  Cardiovascular:     Rate and Rhythm: Normal rate and regular rhythm.  Pulmonary:     Effort: Pulmonary effort is normal.     Breath sounds: Normal breath sounds.  Lymphadenopathy:     Cervical: No cervical adenopathy.  Skin:    General: Skin is warm and dry.     Findings: Rash present.     Comments: Very dry skin with multiple split open skin areas on back of MCPs  Neurological:     Mental Status: She is alert.  Psychiatric:        Mood and Affect: Mood normal.      Musculoskeletal Exam:  Elbows full ROM no tenderness or swelling Wrists full ROM no tenderness or swelling Fingers with right hand DIP heberdon's nodes, left hand milder changes worst at 1st CMC, no palpable synovitis Right lateral hip pain provoked with internal and external rotation, and  tenderness at greater trochanter and posterior Knees full ROM no tenderness or swelling Ankles full ROM no tenderness or swelling   Investigation: No additional findings.  Imaging: No results found.  Recent Labs: Lab Results  Component Value Date   WBC 10.0 08/27/2023   HGB 13.5 08/27/2023   PLT 504.0 (H) 08/27/2023   NA 139 08/27/2023   K 3.7 08/27/2023   CL 100 08/27/2023   CO2 28 08/27/2023   GLUCOSE 141 (H) 08/27/2023   BUN 16 08/27/2023   CREATININE 0.57 08/27/2023   BILITOT 0.5 08/27/2023   ALKPHOS 57 08/27/2023   AST 23 08/27/2023   ALT 21 08/27/2023   PROT 6.7 08/27/2023   ALBUMIN 4.3 08/27/2023   CALCIUM 9.4 08/27/2023    Speciality Comments: No specialty comments available.  Procedures:  No procedures performed Allergies: Ambien [zolpidem tartrate], Flexeril [cyclobenzaprine], Norvasc [amlodipine], and Naproxen   Assessment / Plan:     Visit Diagnoses: Rheumatoid arthritis of multiple sites with negative rheumatoid factor (HCC) Primary osteoarthritis of both hands No significant peripheral joint synovitis at this time.  If anything joint swelling in her hands is decreased and she is on the higher dose of prednisone.  We discussed potential side effects with prolonged use of steroids but currently I think her benefit is outweighing the risk and avoiding requiring stronger alternate pain medications.  I recommend she try decreasing the dose after current back and/or hip problems improve either with time or seeing orthopedic specialist.  Pain in right hip Chronic low back pain with sciatica, sciatica laterality unspecified, unspecified back pain laterality Persistent despite recent steroid injection. Pain radiates down the leg, similar to previous sciatica. Patient has a history of back surgery. X-ray from two years ago showed osteoarthritis. -Refer to back specialist (Dr. Christell Constant or Dr. Alvester Morin) for further evaluation and potential treatment options such as  imaging, injections, nerve ablations, or trigger points, patient prefers to research providers on her own and  reach out  Sicca syndrome (HCC) Still dry without major complications  Dry Skin Noted on hands, patient reports due to frequent washing. Patient currently using Lubriderm.  Long-term use of steroids Osteoporosis On treatment with Prolia injection 60 mg every 6 months manage through PCP Dr. Lavone Neri office  General Health Maintenance -Consider obtaining updated imaging of lumbar spine and hip to assess for any structural changes over the past two years.    Orders: No orders of the defined types were placed in this encounter.  Meds ordered this encounter  Medications   predniSONE (DELTASONE) 5 MG tablet    Sig: 1-2 tablets daily as needed    Dispense:  60 tablet    Refill:  2    25 minutes were spent today in patient care including review of previous labs and imaging, outside provider notes, detailed physical exam, patient counseling on multiple treatment options, and documentation.  Follow-Up Instructions: Return in about 6 months (around 07/20/2024) for OA/RA on GC f/u 6mos or PRN.   Fuller Plan, MD  Note - This record has been created using [employer].  Chart creation errors have been sought, but may not always  have been located. Such creation errors do not reflect on  the standard of medical care.

## 2024-01-20 DIAGNOSIS — Z79891 Long term (current) use of opiate analgesic: Secondary | ICD-10-CM | POA: Diagnosis not present

## 2024-01-20 DIAGNOSIS — M47812 Spondylosis without myelopathy or radiculopathy, cervical region: Secondary | ICD-10-CM | POA: Diagnosis not present

## 2024-01-20 DIAGNOSIS — M47816 Spondylosis without myelopathy or radiculopathy, lumbar region: Secondary | ICD-10-CM | POA: Diagnosis not present

## 2024-01-20 DIAGNOSIS — M961 Postlaminectomy syndrome, not elsewhere classified: Secondary | ICD-10-CM | POA: Diagnosis not present

## 2024-01-20 DIAGNOSIS — G894 Chronic pain syndrome: Secondary | ICD-10-CM | POA: Diagnosis not present

## 2024-01-25 MED ORDER — DENOSUMAB 60 MG/ML ~~LOC~~ SOSY
60.0000 mg | PREFILLED_SYRINGE | Freq: Once | SUBCUTANEOUS | Status: AC
Start: 2024-02-08 — End: ?

## 2024-01-25 NOTE — Telephone Encounter (Signed)
 Copied from CRM [id number]. Topic: Clinical - Medical Advice >> Jan 25, 2024 11:41 AM Shelbie Proctor wrote: Reason for CRM: Patient [phone] take an injection for osteoarthritis unsure of the name of shot- Prolia. Patient is unsure if she is due for the injection, asking for clarification. If patient is due, would like see if she can have the injection when bring her husband to see Dr. Jimmey Ralph 02/01/24 at 11 am. Please advise and call back.  Spoke with patient, aware next Prolia need to schedule an appt around 04/05/2024

## 2024-01-25 NOTE — Telephone Encounter (Signed)
 Copied from CRM [id number]. Topic: Clinical - Medical Advice >> Jan 25, 2024 11:41 AM Shelbie Proctor wrote: Reason for CRM: Patient [phone] take an injection for osteoarthritis unsure of the name of shot- Prolia. Patient is unsure if she is due for the injection, asking for clarification. If patient is due, would like see if she can have the injection when bring her husband to see Dr. Jimmey Ralph 02/01/24 at 11 am. Please advise and call back.   P

## 2024-01-29 ENCOUNTER — Other Ambulatory Visit: Payer: Self-pay | Admitting: Physical Medicine and Rehabilitation

## 2024-01-29 DIAGNOSIS — M5416 Radiculopathy, lumbar region: Secondary | ICD-10-CM

## 2024-02-04 ENCOUNTER — Encounter: Payer: Self-pay | Admitting: Family Medicine

## 2024-02-04 ENCOUNTER — Ambulatory Visit: Admitting: Family Medicine

## 2024-02-04 VITALS — BP 155/73 | HR 83 | Temp 97.9°F | Ht 62.5 in | Wt 113.4 lb

## 2024-02-04 DIAGNOSIS — F5102 Adjustment insomnia: Secondary | ICD-10-CM | POA: Diagnosis not present

## 2024-02-04 DIAGNOSIS — R21 Rash and other nonspecific skin eruption: Secondary | ICD-10-CM

## 2024-02-04 DIAGNOSIS — F439 Reaction to severe stress, unspecified: Secondary | ICD-10-CM | POA: Diagnosis not present

## 2024-02-04 DIAGNOSIS — B079 Viral wart, unspecified: Secondary | ICD-10-CM | POA: Diagnosis not present

## 2024-02-04 DIAGNOSIS — M0609 Rheumatoid arthritis without rheumatoid factor, multiple sites: Secondary | ICD-10-CM | POA: Diagnosis not present

## 2024-02-04 MED ORDER — TRIAZOLAM 0.25 MG PO TABS: 0.5000 mg | ORAL_TABLET | Freq: Every evening | ORAL | 5 refills | Status: DC | PRN

## 2024-02-04 MED ORDER — CLOBETASOL PROPIONATE 0.05 % EX OINT: 1.0000 | TOPICAL_OINTMENT | Freq: Two times a day (BID) | CUTANEOUS | 3 refills | Status: DC

## 2024-02-04 NOTE — Patient Instructions (Signed)
 It was very nice to see you today!  Please try the clobetasol for your rash.  Let me know if not improving in the next 1 to 2 weeks.  We froze the wart on your right middle finger.  Let us know if not improving in the next few weeks.  Your ears today are clear.  Please follow-up with your audiologist.  Let us know if you need referral to see a counselor.  Return if symptoms worsen or fail to improve.   Take care, Dr Jimmey Ralph  PLEASE NOTE:  If you had any lab tests, please let us know if you have not heard back within a few days. You may see your results on mychart before we have a chance to review them but we will give you a call once they are reviewed by [place].   If we ordered any referrals today, please let us know if you have not heard from their office within the next week.   If you had any urgent prescriptions sent in today, please check with the pharmacy within an hour of our visit to make sure the prescription was transmitted appropriately.   Please try these tips to maintain a healthy lifestyle:  Eat at least 3 REAL meals and 1-2 snacks per day.  Aim for no more than 5 hours between eating.  If you eat breakfast, please do so within one hour of getting up.   Each meal should contain half fruits/vegetables, one quarter protein, and one quarter carbs (no bigger than a computer mouse)  Cut down on sweet beverages. This includes juice, soda, and sweet tea.   Drink at least 1 glass of water with each meal and aim for at least 8 glasses per day  Exercise at least 150 minutes every week.

## 2024-02-04 NOTE — Progress Notes (Signed)
   Patricia Underwood is a 73 y.o. female who presents today for an office visit.  Assessment/Plan:  New/Acute Problems: Rash Possibly dyshidrosis.  Will start topical clobetasol.  Hopefully have some improvement now that we are moving into or more whether.  She can use over-the-counter emollients as needed as well.  She will let us know if not improving in the next 1 to 2 weeks.  If no improvement would consider empiric treatment for scabies.  Cutaneous wart Cryotherapy applied today.  See below procedure note.  She tolerated well.  She can come back for repeat in 2 to 3 weeks if needed.   Chronic Problems Addressed Today: Insomnia Stable on triazolam 5 mg nightly as needed.  No significant side effects.  Will refill today.  Rheumatoid arthritis (HCC) Follows with rheumatology for this. On chronic prednisone.   Stress Patient has been under more stress recently due to her husband's medical conditions.  She will be scheduling appointment with a counselor soon.  Declined referral today.     Subjective:  HPI:  See Assessment / plan for status of chronic conditions. Patient here with dry and cracked skin on her hands. This has been going on for a few months.  Mostly located on the left hand.  She has tried over-the-counter lubricants without much improvement.  She also has a wart on her right middle finger.  This has been present for several years.  She would like to have it frozen today.  She is also had decreased hearing in her right ear.  She would like to have her ear checked today.  She is also been under more stress due to her husband's medical conditions.  This has been going on for quite a while.  She will be contacting counselor soon.       Objective:  Physical Exam: BP (!) 155/73   Pulse 83   Temp 97.9 F (36.6 C) (Temporal)   Ht 5' 2.5" (1.588 m)   Wt 113 lb 6.4 oz (51.4 kg)   SpO2 99%   BMI 20.41 kg/m   Gen: No acute distress, resting comfortably  HEENT: Right TM  clear Skin: Dry cracked rash involving left dorsal hand with several excoriations.  Approximately 1 cm hyperkeratotic lesion noted on distal right third digit. Neuro: Grossly normal, moves all extremities Psych: Normal affect and thought content  Cryotherapy Procedure Note  Pre-operative Diagnosis: Cutaneous wart  Locations: Right Third finger  Indications: Therapeutic  Procedure Details  Patient informed of risks (permanent scarring, infection, light or dark discoloration, bleeding, infection, weakness, numbness and recurrence of the lesion) and benefits of the procedure and verbal informed consent obtained.  The areas are treated with liquid nitrogen therapy, frozen until ice ball extended 3 mm beyond lesion, allowed to thaw, and treated again. The patient tolerated procedure well.  The patient was instructed on post-op care, warned that there may be blister formation, redness and pain. Recommend OTC analgesia as needed for pain.  Condition: Stable  Complications: none.       Katina Degree. Jimmey Ralph, MD 02/04/2024 11:28 AM

## 2024-02-04 NOTE — Assessment & Plan Note (Signed)
 Stable on triazolam 5 mg nightly as needed.  No significant side effects.  Will refill today.

## 2024-02-04 NOTE — Assessment & Plan Note (Signed)
 Follows with rheumatology for this. On chronic prednisone.

## 2024-02-04 NOTE — Assessment & Plan Note (Signed)
 Patient has been under more stress recently due to her husband's medical conditions.  She will be scheduling appointment with a counselor soon.  Declined referral today.

## 2024-02-05 NOTE — Telephone Encounter (Signed)
 See note

## 2024-02-09 ENCOUNTER — Telehealth: Payer: Self-pay

## 2024-02-09 NOTE — Telephone Encounter (Signed)
 Prolia VOB initiated via [email or web address]  Next Prolia inj DUE: 04/05/24

## 2024-02-11 ENCOUNTER — Ambulatory Visit: Admitting: Family Medicine

## 2024-02-12 NOTE — Telephone Encounter (Signed)
 Pt ready for scheduling for PROLIA on or after : 04/05/24  Option# 1: Buy/Bill (Office supplied medication)  Out-of-pocket cost due at time of clinic visit: $0  Number of injection/visits approved: ---  Primary: MEDICARE Prolia co-insurance: 0% Admin fee co-insurance: 0%  Secondary: [employer] Prolia co-insurance:  Admin fee co-insurance:   Medical Benefit Details: Date Benefits were checked: 02/11/24 Deductible: $257 Met of $257 Required/ Coinsurance: 0%/ Admin Fee: 0%  Prior Auth: N/A PA# Expiration Date:   # of doses approved: ----------------------------------------------------------------------- Option# 2- Med Obtained from pharmacy:  Pharmacy benefit: Copay $--- (Paid to pharmacy) Admin Fee: --- (Pay at clinic)  Prior Auth: N/A PA# Expiration Date:   # of doses approved:   If patient wants fill through the pharmacy benefit please send prescription to:  --- , and include estimated need by date in rx notes. Pharmacy will ship medication directly to the office.  Patient NOT eligible for Prolia Copay Card. Copay Card can make patient's cost as little as $25. Link to apply: [email or web address]://[email or web address]  ** This summary of benefits is an estimation of the patient's out-of-pocket cost. Exact cost may very based on individual plan coverage.

## 2024-02-12 NOTE — Telephone Encounter (Signed)
 [name]

## 2024-02-19 ENCOUNTER — Other Ambulatory Visit

## 2024-02-23 ENCOUNTER — Telehealth: Payer: Self-pay | Admitting: Family Medicine

## 2024-02-23 NOTE — Telephone Encounter (Signed)
 Patient is scheduled with you on 02/25/24 for TOC. Is this okay with you?

## 2024-02-25 ENCOUNTER — Ambulatory Visit (INDEPENDENT_AMBULATORY_CARE_PROVIDER_SITE_OTHER): Payer: Medicare Other | Admitting: Family Medicine

## 2024-02-25 ENCOUNTER — Encounter: Payer: Self-pay | Admitting: Family Medicine

## 2024-02-25 VITALS — BP 133/58 | HR 103 | Temp 97.7°F | Ht 62.5 in | Wt 112.6 lb

## 2024-02-25 DIAGNOSIS — E538 Deficiency of other specified B group vitamins: Secondary | ICD-10-CM | POA: Diagnosis not present

## 2024-02-25 DIAGNOSIS — I2510 Atherosclerotic heart disease of native coronary artery without angina pectoris: Secondary | ICD-10-CM | POA: Diagnosis not present

## 2024-02-25 DIAGNOSIS — G894 Chronic pain syndrome: Secondary | ICD-10-CM | POA: Diagnosis not present

## 2024-02-25 DIAGNOSIS — I2584 Coronary atherosclerosis due to calcified coronary lesion: Secondary | ICD-10-CM | POA: Diagnosis not present

## 2024-02-25 DIAGNOSIS — E559 Vitamin D deficiency, unspecified: Secondary | ICD-10-CM

## 2024-02-25 DIAGNOSIS — R7303 Prediabetes: Secondary | ICD-10-CM

## 2024-02-25 DIAGNOSIS — E7800 Pure hypercholesterolemia, unspecified: Secondary | ICD-10-CM | POA: Diagnosis not present

## 2024-02-25 DIAGNOSIS — F5101 Primary insomnia: Secondary | ICD-10-CM | POA: Diagnosis not present

## 2024-02-25 NOTE — Progress Notes (Signed)
 Subjective:     Patient ID: Patricia Underwood, female    DOB: 1951-08-19, 73 y.o.   MRN: [id number]  Chief Complaint  Patient presents with   Transitions Of Care    Pt stated that her husband passed    HPI Hld, vit d def,  Discussed the use of AI scribe software for clinical note transcription with the patient, who gave verbal consent to proceed.  History of Present Illness A 72 year old female presents with concerns about her husband's recent passing and her own health management.  She is experiencing significant emotional distress following the recent passing of her husband, Patricia Underwood, who had been suffering from cancer and was on palliative care. She describes a traumatic experience at the hospital where [name] was intubated and received CPR despite having a no resuscitation order, leading to her distress over the handling of his care and the lack of communication from the medical staff.  She is currently taking Prolia for osteoporosis, atorvastatin for cholesterol, prednisone for arthritis-related finger pain, and triazolam for sleep. She has difficulty sleeping due to recent events and has been taking triazolam to help with sleep.  No SI  She is concerned about her glucose levels, noting a family history of diabetes on her mother's side. Her glucose levels have been high despite fasting, and she is interested in having her cholesterol, vitamin D, and glucose levels checked.  She has a history of carotid artery disease and fluctuating blood pressure, with a family history of heart disease. Her father had a stroke, and her mother had a massive heart attack.  She is planning to move to [place] following her husband's passing and is concerned about managing her medications and health care during this transition. Taking opiates for arthritis pain-going to pain mgmt.  Wanted me to manage and advised, she needs to continue w/pain mgmt for that.     Health Maintenance Due  Topic Date Due   MAMMOGRAM   11/05/2020    Past Medical History:  Diagnosis Date   Aortic atherosclerosis (HCC)    Arthritis    B12 deficiency, Rx B12 injections 07/05/2017   Chronic back pain    Chronic right shoulder pain 05/30/2017   Coronary artery calcification seen on CAT scan    Diverticulitis    Diverticulosis    Esophageal spasm    Fibromyalgia    HLD (hyperlipidemia) 05/30/2017   HTN (hypertension)    Torn meniscus    right    Past Surgical History:  Procedure Laterality Date   ABDOMINAL HYSTERECTOMY     LIGAMENT REPAIR Left 12/15/2022   Thumb   LUMBAR FUSION     L4-L5   SHOULDER ARTHROSCOPY Right    TONSILLECTOMY AND ADENOIDECTOMY       Current Outpatient Medications:    atorvastatin (LIPITOR) 20 MG tablet, Take 1 tablet (20 mg total) by mouth daily., Disp: 90 tablet, Rfl: 3   clobetasol ointment (TEMOVATE) 0.05 %, Apply 1 Application topically 2 (two) times daily., Disp: 60 g, Rfl: 3   cyanocobalamin (VITAMIN B12) 1000 MCG/ML injection, 1000 mcg (1 mg) injection once every other week, Disp: 6 mL, Rfl: 3   oxyCODONE-acetaminophen (PERCOCET/ROXICET) 5-325 MG tablet, Take 1 tablet by mouth every 6 (six) hours as needed for severe pain., Disp: 90 tablet, Rfl: 0   predniSONE (DELTASONE) 5 MG tablet, 1-2 tablets daily as needed, Disp: 60 tablet, Rfl: 2   triazolam (HALCION) 0.25 MG tablet, Take 2 tablets (0.5 mg total) by mouth at bedtime  as needed for sleep., Disp: 60 tablet, Rfl: 5   Vitamin D, Ergocalciferol, (DRISDOL) 1.25 MG (50000 UNIT) CAPS capsule, TAKE 1 CAPSULE BY MOUTH EVERY 7 DAYS, Disp: 12 capsule, Rfl: 3  Current Facility-Administered Medications:    denosumab (PROLIA) injection 60 mg, 60 mg, Subcutaneous, Once, Rodney Clamp, MD   Cecily Cohen ON 04/05/2024] denosumab (PROLIA) injection 60 mg, 60 mg, Subcutaneous, Once, Rodney Clamp, MD   denosumab (PROLIA) injection 60 mg, 60 mg, Subcutaneous, Once, Rodney Clamp, MD  Allergies  Allergen Reactions   Ambien [Zolpidem  Tartrate]    Flexeril [Cyclobenzaprine] Other (See Comments)    Patient unsure of reaction   Norvasc [Amlodipine] Swelling    Edema of feet and ankles    Naproxen Other (See Comments)    Patient unsure of reaction.   ROS neg/noncontributory except as noted HPI/below      Objective:     BP (!) 133/58   Pulse (!) 103   Temp 97.7 F (36.5 C)   Ht 5' 2.5" (1.588 m)   Wt 112 lb 9.6 oz (51.1 kg)   SpO2 97%   BMI 20.27 kg/m  Wt Readings from Last 3 Encounters:  02/25/24 112 lb 9.6 oz (51.1 kg)  02/04/24 113 lb 6.4 oz (51.4 kg)  01/18/24 115 lb (52.2 kg)    Physical Exam   Gen: WDWN NAD HEENT: NCAT, conjunctiva not injected, sclera nonicteric NECK:  supple, no thyromegaly, no nodes, no carotid bruits CARDIAC: RRR, S1S2+, no murmur. DP 2+B LUNGS: CTAB. No wheezes ABDOMEN:  BS+, soft, NTND, No HSM, no masses EXT:  no edema MSK: no gross abnormalities.  NEURO: A&O x3.  CN II-XII intact.  PSYCH: normal mood. Good eye contact     Assessment & Plan:  Pure hypercholesterolemia -     Comprehensive metabolic panel with GFR; Future -     Lipid panel; Future -     TSH; Future  Coronary artery disease due to calcified coronary lesion -     CBC with Differential/Platelet; Future -     Comprehensive metabolic panel with GFR; Future -     TSH; Future  Prediabetes -     Comprehensive metabolic panel with GFR; Future -     Hemoglobin A1c; Future  B12 deficiency, Rx B12 injections -     Vitamin B12; Future  Chronic pain syndrome  Primary insomnia  Vitamin D deficiency -     VITAMIN D 25 Hydroxy (Vit-D Deficiency, Fractures); Future  Assessment and Plan Assessment & Plan Arthritis   She experiences significant pain in her fingers, described as 'sausage fingers', and is currently managing this with prednisone.  Hypertension   Her blood pressure fluctuates, and she has a family history of cardiovascular events, raising concerns about the risk of stroke and heart attack.  Regular blood pressure monitoring is advised.  Carotid Artery Disease   She has a history of carotid artery disease and is concerned about her cardiovascular health due to her family history. Taking atorvastatin  Hyperlipidemia   She is on atorvastatin for cholesterol management and is interested in checking her cholesterol levels. A lipid panel will be ordered to assess her cholesterol.  Prediabetes   Her glucose levels are elevated, and she has a family history of diabetes, which is concerning. A fasting glucose test will be ordered to assess her current glucose levels.  Osteoporosis   She is currently on Prolia for management and is due for her next injection in May.  The Prolia injection will be administered then.  Insomnia   She has been prescribed triazolam for sleep but is avoiding its use due to her husband's illness, with plans to resume.  General Health Maintenance   She is interested in checking her vitamin D levels, so a vitamin D level test will be ordered.  Follow-up   She plans to move to [place]  and is considering her healthcare needs in the new location. Blood work will be scheduled once she feels better, and results will be discussed to consider potential treatment options if diabetes or other issues are indicated.    Return if symptoms worsen or fail to improve.  Ellsworth Haas, MD

## 2024-02-25 NOTE — Patient Instructions (Signed)
 It was very nice to see you today!  So sorry about [name]   PLEASE NOTE:  If you had any lab tests please let us  know if you have not heard back within a few days. You may see your results on MyChart before we have a chance to review them but we will give you a call once they are reviewed by us . If we ordered any referrals today, please let us  know if you have not heard from their office within the next week.   Please try these tips to maintain a healthy lifestyle:  Eat most of your calories during the day when you are active. Eliminate processed foods including packaged sweets (pies, cakes, cookies), reduce intake of potatoes, white bread, white pasta, and white rice. Look for whole grain options, oat flour or almond flour.  Each meal should contain half fruits/vegetables, one quarter protein, and one quarter carbs (no bigger than a computer mouse).  Cut down on sweet beverages. This includes juice, soda, and sweet tea. Also watch fruit intake, though this is a healthier sweet option, it still contains natural sugar! Limit to 3 servings daily.  Drink at least 1 glass of water with each meal and aim for at least 8 glasses per day  Exercise at least 150 minutes every week.

## 2024-02-29 DIAGNOSIS — G894 Chronic pain syndrome: Secondary | ICD-10-CM | POA: Diagnosis not present

## 2024-02-29 DIAGNOSIS — M47816 Spondylosis without myelopathy or radiculopathy, lumbar region: Secondary | ICD-10-CM | POA: Diagnosis not present

## 2024-02-29 DIAGNOSIS — M47812 Spondylosis without myelopathy or radiculopathy, cervical region: Secondary | ICD-10-CM | POA: Diagnosis not present

## 2024-02-29 DIAGNOSIS — M961 Postlaminectomy syndrome, not elsewhere classified: Secondary | ICD-10-CM | POA: Diagnosis not present

## 2024-03-02 ENCOUNTER — Ambulatory Visit: Admitting: Internal Medicine

## 2024-03-07 ENCOUNTER — Encounter: Payer: Medicare Other | Admitting: Family Medicine

## 2024-03-07 ENCOUNTER — Other Ambulatory Visit

## 2024-03-14 ENCOUNTER — Encounter: Payer: Self-pay | Admitting: Family Medicine

## 2024-03-14 ENCOUNTER — Ambulatory Visit (INDEPENDENT_AMBULATORY_CARE_PROVIDER_SITE_OTHER): Admitting: Family Medicine

## 2024-03-14 VITALS — BP 160/88 | HR 90 | Temp 97.9°F | Resp 16 | Ht 62.5 in | Wt 109.0 lb

## 2024-03-14 DIAGNOSIS — F4322 Adjustment disorder with anxiety: Secondary | ICD-10-CM | POA: Diagnosis not present

## 2024-03-14 MED ORDER — DIAZEPAM 2 MG PO TABS: 2.0000 mg | ORAL_TABLET | Freq: Two times a day (BID) | ORAL | 0 refills | Status: DC | PRN

## 2024-03-14 NOTE — Progress Notes (Signed)
 Subjective:     Patient ID: Patricia Underwood, female    DOB: 1951-10-24, 73 y.o.   MRN: [id number]  Chief Complaint  Patient presents with   Adjustment Disorder    Patient stated she is having a really hard time since her husband passed    HPI Discussed the use of AI scribe software for clinical note transcription with the patient, who gave verbal consent to proceed.  History of Present Illness Patricia Underwood is a 73 year old female who presents with anxiety and difficulty functioning following the death of her husband.  She is experiencing significant anxiety and difficulty functioning after the death of her husband. She feels overwhelmed and unable to manage her financial responsibilities, expressing that she 'can't think about anything.' Nightmares and insomnia persist despite trying triazolam , which she finds ineffective.  She is seeking short-term medication to help her 'feel better for a couple weeks' to manage her affairs and visit her children. She has googled and is requesting medications like Valium  and Xanax  She is currently taking oxycodone  intermittently.  She describes a recent panic attack with symptoms of elevated blood pressure, heavy breathing, and an inability to calm down. Significant anxiety about her financial situation is present, and she feels 'frozen' due to her lack of computer literacy and support from family, as her son and daughter live far away.  No suicidal thoughts, but she acknowledges crying and having regrets about not seeking help sooner. She has an appointment with a therapist on the 14th and is trying to manage her responsibilities by doing 'one thing a day until it's all done.'  She lives alone and has an elderly dog that requires her attention. Her son lives in [place] and her daughter recently moved to [place] , leaving her without immediate family support nearby.  Denies SI    Health Maintenance Due  Topic Date Due   MAMMOGRAM  11/05/2020    Past  Medical History:  Diagnosis Date   Aortic atherosclerosis (HCC)    Arthritis    B12 deficiency, Rx B12 injections 07/05/2017   Chronic back pain    Chronic right shoulder pain 05/30/2017   Coronary artery calcification seen on CAT scan    Diverticulitis    Diverticulosis    Esophageal spasm    Fibromyalgia    HLD (hyperlipidemia) 05/30/2017   HTN (hypertension)    Torn meniscus    right    Past Surgical History:  Procedure Laterality Date   ABDOMINAL HYSTERECTOMY     LIGAMENT REPAIR Left 12/15/2022   Thumb   LUMBAR FUSION     L4-L5   SHOULDER ARTHROSCOPY Right    TONSILLECTOMY AND ADENOIDECTOMY       Current Outpatient Medications:    atorvastatin  (LIPITOR) 20 MG tablet, Take 1 tablet (20 mg total) by mouth daily., Disp: 90 tablet, Rfl: 3   clobetasol  ointment (TEMOVATE ) 0.05 %, Apply 1 Application topically 2 (two) times daily., Disp: 60 g, Rfl: 3   cyanocobalamin  (VITAMIN B12) 1000 MCG/ML injection, 1000 mcg (1 mg) injection once every other week, Disp: 6 mL, Rfl: 3   diazepam  (VALIUM ) 2 MG tablet, Take 1 tablet (2 mg total) by mouth every 12 (twelve) hours as needed for anxiety., Disp: 20 tablet, Rfl: 0   oxyCODONE -acetaminophen  (PERCOCET) 10-325 MG tablet, Take 1 tablet by mouth 4 (four) times daily as needed., Disp: , Rfl:    predniSONE  (DELTASONE ) 5 MG tablet, 1-2 tablets daily as needed, Disp: 60 tablet, Rfl: 2  triazolam  (HALCION ) 0.25 MG tablet, Take 2 tablets (0.5 mg total) by mouth at bedtime as needed for sleep., Disp: 60 tablet, Rfl: 5   Vitamin D , Ergocalciferol , (DRISDOL ) 1.25 MG (50000 UNIT) CAPS capsule, TAKE 1 CAPSULE BY MOUTH EVERY 7 DAYS, Disp: 12 capsule, Rfl: 3  Current Facility-Administered Medications:    denosumab  (PROLIA ) injection 60 mg, 60 mg, Subcutaneous, Once, Rodney Clamp, MD   Cecily Cohen ON 04/05/2024] denosumab  (PROLIA ) injection 60 mg, 60 mg, Subcutaneous, Once, Rodney Clamp, MD   denosumab  (PROLIA ) injection 60 mg, 60 mg, Subcutaneous,  Once, Daneil Dunker, Jinny Mounts, MD  Allergies  Allergen Reactions   Ambien [Zolpidem Tartrate]    Flexeril [Cyclobenzaprine] Other (See Comments)    Patient unsure of reaction   Norvasc  [Amlodipine ] Swelling    Edema of feet and ankles    Naproxen Other (See Comments)    Patient unsure of reaction.   ROS neg/noncontributory except as noted HPI/below      Objective:     BP (!) 160/88   Pulse 90   Temp 97.9 F (36.6 C) (Temporal)   Resp 16   Ht 5' 2.5" (1.588 m)   Wt 109 lb (49.4 kg)   SpO2 99%   BMI 19.62 kg/m  Wt Readings from Last 3 Encounters:  03/14/24 109 lb (49.4 kg)  02/25/24 112 lb 9.6 oz (51.1 kg)  02/04/24 113 lb 6.4 oz (51.4 kg)    Physical Exam   Gen: WDWN NAD HEENT: NCAT, conjunctiva not injected, sclera nonicteric EXT:  no edema MSK: no gross abnormalities.  NEURO: A&O x3.  CN II-XII intact.  PSYCH: normal mood. Good eye contact  Pdmp checked    Assessment & Plan:  Adjustment disorder with anxious mood  Other orders -     diazePAM ; Take 1 tablet (2 mg total) by mouth every 12 (twelve) hours as needed for anxiety.  Dispense: 20 tablet; Refill: 0  Assessment and Plan Assessment & Plan Anxiety   Her anxiety has worsened following her husband's death, affecting her ability to function and manage daily tasks, with significant stress over financial and personal responsibilities. Short-term pharmacological intervention is sought to improve functionality. Valium  2 mg is prescribed for short-term use to manage symptoms, with caution advised due to her age and addiction potential. She should take Valium  in the evening to assess its effects before engaging in activities like driving. Potential side effects, such as drowsiness and impaired judgment, are discussed, and she is advised against driving until she understands how the medication affects her. Concurrent use of oxycodone  and Valium  should be avoided due to the risk of adverse interactions and potential overdose.  She is encouraged to report back on Valium 's effects within two days, noting any side effects or lack of efficacy.  Panic attack   She experienced a recent panic attack with elevated blood pressure and difficulty calming down, likely due to acute stress and anxiety after her husband's death. Continued use of home blood pressure monitoring is encouraged to track further episodes. She is advised to seek support from a therapist, with an appointment scheduled for May 14th.  Insomnia   Her insomnia, marked by difficulty sleeping and nightmares, may be exacerbated by anxiety and recent bereavement. Previous use of triazolam  was ineffective. Combination therapy with triazolam  and Valium  is considered to address insomnia and anxiety, with caution due to potential sedation. She should monitor Valium 's effects on sleep and report any changes or persistent issues.    No follow-ups on  file.  Ellsworth Haas, MD

## 2024-03-15 NOTE — Telephone Encounter (Signed)
 A user error has taken place: orders placed in error, not carried out on this patient.

## 2024-03-16 ENCOUNTER — Telehealth: Payer: Self-pay | Admitting: Pharmacy Technician

## 2024-03-16 ENCOUNTER — Other Ambulatory Visit (HOSPITAL_COMMUNITY): Payer: Self-pay

## 2024-03-16 NOTE — Telephone Encounter (Signed)
 Pharmacy Patient Advocate Encounter  Received notification from [employer] that Prior Authorization for  DIAZEPAM  2 MG  has been APPROVED from 11/11/2023 to 04/15/2024   PA #/Case ID/Reference #: [id number]

## 2024-03-16 NOTE — Telephone Encounter (Signed)
 Noted.

## 2024-03-16 NOTE — Telephone Encounter (Signed)
 Pharmacy Patient Advocate Encounter   Received notification from CoverMyMeds that prior authorization for DIAZEPAM  2 MG TABLETS is required/requested.   Insurance verification completed.   The patient is insured through [facility] .   Per test claim: PA required; PA submitted to above mentioned insurance via CoverMyMeds Key/confirmation #/EOC [id number] Status is pending

## 2024-03-17 ENCOUNTER — Telehealth: Payer: Self-pay | Admitting: *Deleted

## 2024-03-17 ENCOUNTER — Other Ambulatory Visit: Payer: Self-pay | Admitting: *Deleted

## 2024-03-17 NOTE — Telephone Encounter (Signed)
 See recent MyChart message.  Patricia Underwood. Daneil Dunker, MD 03/17/2024 10:29 AM

## 2024-03-17 NOTE — Telephone Encounter (Signed)
 See note

## 2024-03-17 NOTE — Telephone Encounter (Signed)
 Copied from CRM [id number]. Topic: Clinical - Medication Question >> Mar 17, 2024  8:08 AM Earnestine Goes B wrote: Reason for CRM: pt called to speak with dr. Daneil Dunker, pt states she wants to ask dr Daneil Dunker if he would accept her back as a pt. Please call pt back at [phone]

## 2024-03-17 NOTE — Telephone Encounter (Signed)
 That is fine - I was not aware that she transferred PCP.   Patricia Underwood. Daneil Dunker, MD 03/17/2024 10:27 AM

## 2024-03-18 ENCOUNTER — Ambulatory Visit (INDEPENDENT_AMBULATORY_CARE_PROVIDER_SITE_OTHER): Admitting: Family Medicine

## 2024-03-18 ENCOUNTER — Encounter: Payer: Self-pay | Admitting: Family Medicine

## 2024-03-18 VITALS — BP 146/84 | HR 86 | Temp 97.9°F | Ht 62.5 in | Wt 114.4 lb

## 2024-03-18 DIAGNOSIS — Z634 Disappearance and death of family member: Secondary | ICD-10-CM | POA: Diagnosis not present

## 2024-03-18 DIAGNOSIS — F419 Anxiety disorder, unspecified: Secondary | ICD-10-CM

## 2024-03-18 MED ORDER — ALPRAZOLAM 0.5 MG PO TABS: 0.5000 mg | ORAL_TABLET | Freq: Two times a day (BID) | ORAL | 0 refills | Status: DC | PRN

## 2024-03-18 NOTE — Assessment & Plan Note (Signed)
 Worsened recently as above.  Will give short supply of alprazolam and she will follow-up with us  in a couple weeks.  If needed for longer-term management we can add on SSRI or BuSpar as above.  She will be establishing with a therapist soon as well as above.

## 2024-03-18 NOTE — Patient Instructions (Addendum)
 I am sorry for your loss.   I think following up with a therapist is a great idea.  I will send a prescription in for alprazolam.  Let me know in a few weeks how you are doing.  Return in about 2 weeks (around 04/01/2024).   Take care, Dr Daneil Dunker  PLEASE NOTE:  If you had any lab tests, please let us  know if you have not heard back within a few days. You may see your results on mychart before we have a chance to review them but we will give you a call once they are reviewed by us .   If we ordered any referrals today, please let us  know if you have not heard from their office within the next week.   If you had any urgent prescriptions sent in today, please check with the pharmacy within an hour of our visit to make sure the prescription was transmitted appropriately.   Please try these tips to maintain a healthy lifestyle:  Eat at least 3 REAL meals and 1-2 snacks per day.  Aim for no more than 5 hours between eating.  If you eat breakfast, please do so within one hour of getting up.   Each meal should contain half fruits/vegetables, one quarter protein, and one quarter carbs (no bigger than a computer mouse)  Cut down on sweet beverages. This includes juice, soda, and sweet tea.   Drink at least 1 glass of water with each meal and aim for at least 8 glasses per day  Exercise at least 150 minutes every week.

## 2024-03-18 NOTE — Progress Notes (Signed)
   Patricia Underwood is a 73 y.o. female who presents today for an office visit.  Assessment/Plan:  Bereavement  Had a lengthy discussion with patient today regarding management of her grief and bereavement symptoms.  She is having a significant mount of anxiety surrounding her husband's recent passing as well as uncertainty about what the next steps she needs to take.  Additionally is having a lot of stress regarding settling his accounts.  Her family members live far away though are supportive from a distance.  She will be establishing with a therapist next week.  We also did discuss pharmacological management for her symptoms as well.  She was requesting a short-term medication to help take the edge off some of her stress and anxiety.  She did not respond well to Valium  in the past.  We will give short supply of alprazolam.  She is aware to not take this with her pain medication.  We did discuss potential side effects of alprazolam as well.  Ideally she should only be on this for only a few weeks.  She is agreeable to this..  Hopefully she will have some improvement with meeting with a therapist next week.  She is also planning on meeting with a lawyer next week as well which hopefully will help with alleviating some of her logistical burden and stress.  She will follow-up with me in a week or 2 and we can adjust medications as tolerated.  Would consider addition of SSRI or BuSpar at that time if she needs longer-term management of symptoms.  Anxiety Worsened recently as above.  Will give short supply of alprazolam and she will follow-up with us  in a couple weeks.  If needed for longer-term management we can add on SSRI or BuSpar as above.  She will be establishing with a therapist soon as well as above.      Subjective:  HPI:   See Assessment / plan for status of chronic conditions.  Patient is here today to discuss bereavement.  Her husband passed away couple weeks ago unexpectedly.  She was seen here  by different physician a couple of times for adjustment disorder.  She still has ongoing emotional distress and anxiety surrounding his passing.  Additionally she has noted more logistical stress due to settling his accounts.  She is planning on establishing with a therapist next week to help her with the bereavement process.  She still has frequent panic and anxiety attacks throughout the day and this has made it difficult for her to accomplish the objectives and task that she needs to accomplish.  She has been prescribed Valium  in the past however did not feel like it was particularly effective.  She would like to discuss alternative options to help with managing her symptoms.       Objective:  Physical Exam: BP (!) 146/84   Pulse 86   Temp 97.9 F (36.6 C) (Temporal)   Ht 5' 2.5" (1.588 m)   Wt 114 lb 6.4 oz (51.9 kg)   SpO2 98%   BMI 20.59 kg/m   Gen: No acute distress, resting comfortably Neuro: Grossly normal, moves all extremities Psych: Normal affect and thought content      Taegen Delker M. Daneil Dunker, MD 03/18/2024 12:52 PM

## 2024-03-23 DIAGNOSIS — F4322 Adjustment disorder with anxiety: Secondary | ICD-10-CM | POA: Diagnosis not present

## 2024-03-28 ENCOUNTER — Other Ambulatory Visit: Payer: Self-pay | Admitting: Family Medicine

## 2024-03-28 NOTE — Telephone Encounter (Signed)
 Copied from CRM [id number]. Topic: Clinical - Medication Refill >> Mar 28, 2024 11:36 AM Valeri Gate H wrote: Medication: atorvastatin  (LIPITOR) 20 MG tablet  Has the patient contacted their pharmacy? Yes, she doesn't think the pharmacy got the message (Agent: If no, request that the patient contact the pharmacy for the refill. If patient does not wish to contact the pharmacy document the reason why and proceed with request.) (Agent: If yes, when and what did the pharmacy advise?)  This is the patient's preferred pharmacy:  [facility] [id number] Jonette Nestle, [place] - [address] [place] [address] Phone: [phone] Fax: [phone]  Is this the correct pharmacy for this prescription? Yes If no, delete pharmacy and type the correct one.   Has the prescription been filled recently? No  Is the patient out of the medication? Yes  Has the patient been seen for an appointment in the last year OR does the patient have an upcoming appointment? Yes  Can we respond through MyChart? Yes  Agent: Please be advised that Rx refills may take up to 3 business days. We ask that you follow-up with your pharmacy.

## 2024-03-29 ENCOUNTER — Other Ambulatory Visit: Payer: Self-pay | Admitting: Family Medicine

## 2024-03-29 ENCOUNTER — Encounter: Payer: Self-pay | Admitting: Family Medicine

## 2024-03-29 NOTE — Telephone Encounter (Signed)
 Copied from CRM [id number]. Topic: Clinical - Medication Refill >> Mar 28, 2024 11:36 AM Valeri Gate H wrote: Medication: atorvastatin  (LIPITOR) 20 MG tablet  Has the patient contacted their pharmacy? Yes, she doesn't think the pharmacy got the message (Agent: If no, request that the patient contact the pharmacy for the refill. If patient does not wish to contact the pharmacy document the reason why and proceed with request.) (Agent: If yes, when and what did the pharmacy advise?)  This is the patient's preferred pharmacy:  [facility] [id number] Jonette Nestle, [place] - [address] [place] [address] Phone: [phone] Fax: [phone]  Is this the correct pharmacy for this prescription? Yes If no, delete pharmacy and type the correct one.   Has the prescription been filled recently? No  Is the patient out of the medication? Yes  Has the patient been seen for an appointment in the last year OR does the patient have an upcoming appointment? Yes  Can we respond through MyChart? Yes  Agent: Please be advised that Rx refills may take up to 3 business days. We ask that you follow-up with your pharmacy. >> Mar 29, 2024  9:34 AM Felizardo Hotter wrote: Pt calling on update status on medication which was provided.

## 2024-03-31 MED ORDER — ATORVASTATIN CALCIUM 20 MG PO TABS: 20.0000 mg | ORAL_TABLET | Freq: Every day | ORAL | 3 refills | Status: AC

## 2024-04-05 ENCOUNTER — Ambulatory Visit (INDEPENDENT_AMBULATORY_CARE_PROVIDER_SITE_OTHER): Admitting: Family Medicine

## 2024-04-05 VITALS — BP 102/65 | HR 72 | Temp 97.7°F | Ht 62.5 in | Wt 118.4 lb

## 2024-04-05 DIAGNOSIS — I2510 Atherosclerotic heart disease of native coronary artery without angina pectoris: Secondary | ICD-10-CM

## 2024-04-05 DIAGNOSIS — R7303 Prediabetes: Secondary | ICD-10-CM

## 2024-04-05 DIAGNOSIS — I10 Essential (primary) hypertension: Secondary | ICD-10-CM | POA: Diagnosis not present

## 2024-04-05 DIAGNOSIS — E559 Vitamin D deficiency, unspecified: Secondary | ICD-10-CM

## 2024-04-05 DIAGNOSIS — I2584 Coronary atherosclerosis due to calcified coronary lesion: Secondary | ICD-10-CM | POA: Diagnosis not present

## 2024-04-05 DIAGNOSIS — M810 Age-related osteoporosis without current pathological fracture: Secondary | ICD-10-CM

## 2024-04-05 DIAGNOSIS — E7800 Pure hypercholesterolemia, unspecified: Secondary | ICD-10-CM

## 2024-04-05 DIAGNOSIS — M8580 Other specified disorders of bone density and structure, unspecified site: Secondary | ICD-10-CM

## 2024-04-05 DIAGNOSIS — E538 Deficiency of other specified B group vitamins: Secondary | ICD-10-CM | POA: Diagnosis not present

## 2024-04-05 DIAGNOSIS — F419 Anxiety disorder, unspecified: Secondary | ICD-10-CM | POA: Diagnosis not present

## 2024-04-05 LAB — CBC WITH DIFFERENTIAL/PLATELET
Basophils Absolute: 0 10*3/uL (ref 0.0–0.1)
Basophils Relative: 0.6 % (ref 0.0–3.0)
Eosinophils Absolute: 0.1 10*3/uL (ref 0.0–0.7)
Eosinophils Relative: 1.3 % (ref 0.0–5.0)
HCT: 36.3 % (ref 36.0–46.0)
Hemoglobin: 12.1 g/dL (ref 12.0–15.0)
Lymphocytes Relative: 20 % (ref 12.0–46.0)
Lymphs Abs: 1.6 10*3/uL (ref 0.7–4.0)
MCHC: 33.3 g/dL (ref 30.0–36.0)
MCV: 96.5 fl (ref 78.0–100.0)
Monocytes Absolute: 0.5 10*3/uL (ref 0.1–1.0)
Monocytes Relative: 6.9 % (ref 3.0–12.0)
Neutro Abs: 5.6 10*3/uL (ref 1.4–7.7)
Neutrophils Relative %: 71.2 % (ref 43.0–77.0)
Platelets: 325 10*3/uL (ref 150.0–400.0)
RBC: 3.77 Mil/uL — ABNORMAL LOW (ref 3.87–5.11)
RDW: 12.5 % (ref 11.5–15.5)
WBC: 7.9 10*3/uL (ref 4.0–10.5)

## 2024-04-05 LAB — COMPREHENSIVE METABOLIC PANEL WITH GFR
ALT: 15 U/L (ref 0–35)
AST: 20 U/L (ref 0–37)
Albumin: 4.1 g/dL (ref 3.5–5.2)
Alkaline Phosphatase: 73 U/L (ref 39–117)
BUN: 11 mg/dL (ref 6–23)
CO2: 27 meq/L (ref 19–32)
Calcium: 8.7 mg/dL (ref 8.4–10.5)
Chloride: 102 meq/L (ref 96–112)
Creatinine, Ser: 0.51 mg/dL (ref 0.40–1.20)
GFR: 92.71 mL/min (ref >60.00)
Glucose, Bld: 98 mg/dL (ref 70–99)
Potassium: 3.6 meq/L (ref 3.5–5.1)
Sodium: 139 meq/L (ref 135–145)
Total Bilirubin: 0.5 mg/dL (ref 0.2–1.2)
Total Protein: 6.2 g/dL (ref 6.0–8.3)

## 2024-04-05 LAB — LIPID PANEL
Cholesterol: 209 mg/dL — ABNORMAL HIGH (ref 0–200)
HDL: 83.3 mg/dL (ref >39.00)
LDL Cholesterol: 109 mg/dL — ABNORMAL HIGH (ref 0–99)
NonHDL: 125.58
Total CHOL/HDL Ratio: 3
Triglycerides: 81 mg/dL (ref 0.0–149.0)
VLDL: 16.2 mg/dL (ref 0.0–40.0)

## 2024-04-05 LAB — VITAMIN B12: Vitamin B-12: 383 pg/mL (ref 211–911)

## 2024-04-05 LAB — VITAMIN D 25 HYDROXY (VIT D DEFICIENCY, FRACTURES): VITD: 31.77 ng/mL (ref 30.00–100.00)

## 2024-04-05 LAB — TSH: TSH: 2.16 u[IU]/mL (ref 0.35–5.50)

## 2024-04-05 LAB — HEMOGLOBIN A1C: Hgb A1c MFr Bld: 6 % (ref 4.6–6.5)

## 2024-04-05 MED ORDER — DENOSUMAB 60 MG/ML ~~LOC~~ SOSY
60.0000 mg | PREFILLED_SYRINGE | Freq: Once | SUBCUTANEOUS | Status: AC
  Administered 2024-10-11: 60 mg via SUBCUTANEOUS

## 2024-04-05 NOTE — Assessment & Plan Note (Signed)
 Blood pressure on low side today.  She was mildly elevated here few weeks ago.  Will check labs.

## 2024-04-05 NOTE — Patient Instructions (Signed)
 It was very nice to see you today!  Please try taking half a dose of alprazolam  in the morning or early to see how you do with this.  Follow-up with me in a few weeks to let us  know how you are doing.  Will give your Prolia  injection today.  Return if symptoms worsen or fail to improve.   Take care, Dr Daneil Dunker  PLEASE NOTE:  If you had any lab tests, please let us  know if you have not heard back within a few days. You may see your results on mychart before we have a chance to review them but we will give you a call once they are reviewed by us .   If we ordered any referrals today, please let us  know if you have not heard from their office within the next week.   If you had any urgent prescriptions sent in today, please check with the pharmacy within an hour of our visit to make sure the prescription was transmitted appropriately.   Please try these tips to maintain a healthy lifestyle:  Eat at least 3 REAL meals and 1-2 snacks per day.  Aim for no more than 5 hours between eating.  If you eat breakfast, please do so within one hour of getting up.   Each meal should contain half fruits/vegetables, one quarter protein, and one quarter carbs (no bigger than a computer mouse)  Cut down on sweet beverages. This includes juice, soda, and sweet tea.   Drink at least 1 glass of water with each meal and aim for at least 8 glasses per day  Exercise at least 150 minutes every week.

## 2024-04-05 NOTE — Assessment & Plan Note (Signed)
Prolia given today. 

## 2024-04-05 NOTE — Assessment & Plan Note (Addendum)
 Patient still going through grief process due to recent passing of her husband.  Has tolerated her alprazolam  well without any significant side effects though has only been taking once per day or so.  Overall symptoms are manageable.  She can try taking a half a dose of her current alprazolam  prescription early in the day if needed.  We discussed potential side effects again today including drowsiness and somnolence.  She has been avoiding taking alprazolam  with her pain meds or Halcion .   Patient also established with a therapist since our last visit.  This seems to be a good relationship and she will be seeing them later this week.  She will follow-up with us  in a few weeks via MyChart.  Anticipate that symptoms will continue to improve however if not improving we could consider addition of SSRI or BuSpar.

## 2024-04-05 NOTE — Progress Notes (Signed)
   Patricia Underwood is a 73 y.o. female who presents today for an office visit.  Assessment/Plan:  Chronic Problems Addressed Today: Anxiety Patient still going through grief process due to recent passing of her husband.  Has tolerated her alprazolam  well without any significant side effects though has only been taking once per day or so.  Overall symptoms are manageable.  She can try taking a half a dose of her current alprazolam  prescription early in the day if needed.  We discussed potential side effects again today including drowsiness and somnolence.  She has been avoiding taking alprazolam  with her pain meds or Halcion .   Patient also established with a therapist since our last visit.  This seems to be a good relationship and she will be seeing them later this week.  She will follow-up with us  in a few weeks via MyChart.  Anticipate that symptoms will continue to improve however if not improving we could consider addition of SSRI or BuSpar.  Osteoporosis Prolia  given today.  Essential hypertension Blood pressure on low side today.  She was mildly elevated here few weeks ago.  Will check labs.     Subjective:  HPI:  See A/P for status of chronic conditions.  Patient is here today for follow-up.  I saw her about 3 weeks ago.  At that time she was going through bereavement with recent passing of her husband unexpectedly.  At her visit we started her on alprazolam  and she was planning on following up with a therapist. She does feel like the alprazolam  has helped with her anxiety. She has not been talking her halcion  the last few weeks while on this. She did see her therapist since our last visit which went well.  She has been taking the alprazolam  as needed mostly at night.  She has not had any significant side effects with this.  She has been hesitant to take it during the day due to concern for excessive drowsiness and somnolence.       Objective:  Physical Exam: BP 102/65   Pulse 72    Temp 97.7 F (36.5 C) (Temporal)   Ht 5' 2.5" (1.588 m)   Wt 118 lb 6.4 oz (53.7 kg)   SpO2 96%   BMI 21.31 kg/m   Gen: No acute distress, resting comfortably CV: Regular rate and rhythm with no murmurs appreciated Pulm: Normal work of breathing, clear to auscultation bilaterally with no crackles, wheezes, or rhonchi Neuro: Grossly normal, moves all extremities Psych: Normal affect and thought content      Patricia Underwood M. Daneil Dunker, MD 04/05/2024 11:35 AM

## 2024-04-06 ENCOUNTER — Ambulatory Visit

## 2024-04-06 ENCOUNTER — Ambulatory Visit: Admitting: Family Medicine

## 2024-04-06 DIAGNOSIS — F4322 Adjustment disorder with anxiety: Secondary | ICD-10-CM | POA: Diagnosis not present

## 2024-04-07 ENCOUNTER — Ambulatory Visit: Payer: Self-pay | Admitting: Family Medicine

## 2024-04-07 NOTE — Progress Notes (Signed)
 Cholesterol is borderline but stable to last year.  Her blood sugar is also borderline but stable.  Do not need to start meds for either of these.  She should continue to work on diet and exercise and we can recheck in a year.  The rest of her labs are all at goal.

## 2024-04-11 ENCOUNTER — Other Ambulatory Visit: Payer: Self-pay | Admitting: Internal Medicine

## 2024-04-11 DIAGNOSIS — M0609 Rheumatoid arthritis without rheumatoid factor, multiple sites: Secondary | ICD-10-CM

## 2024-04-11 NOTE — Telephone Encounter (Signed)
 Last Fill: 01/18/2024  Next Visit: 07/18/2024  Last Visit: 01/18/2024  Dx: Rheumatoid arthritis of multiple sites with negative rheumatoid factor (HCC)   Current Dose per office note on 01/18/2024: not mentioned  Okay to refill Prednisone ?

## 2024-04-11 NOTE — Telephone Encounter (Signed)
 Patient contacted the office to request a medication refill.   1. Name of Medication: Prednisone   2. How are you currently taking this medication (dosage and times per day)? 2 tablets/day   3. What pharmacy would you like for that to be sent to? [facility] at [employer]

## 2024-04-11 NOTE — Telephone Encounter (Signed)
 Patient contacted the office again regarding her prescription. Patient also states she is having problems with her joints, and that it may be due to her husband passing, and would like to be seen sooner. Transferred the patient up front to see if she could be scheduled earlier.

## 2024-04-12 ENCOUNTER — Encounter: Payer: Self-pay | Admitting: Family Medicine

## 2024-04-12 ENCOUNTER — Other Ambulatory Visit: Payer: Self-pay | Admitting: *Deleted

## 2024-04-12 DIAGNOSIS — R718 Other abnormality of red blood cells: Secondary | ICD-10-CM

## 2024-04-12 MED ORDER — PREDNISONE 5 MG PO TABS
ORAL_TABLET | ORAL | 2 refills | Status: DC
Start: 2024-04-12 — End: 2024-06-16

## 2024-04-12 NOTE — Telephone Encounter (Signed)
**Note De-identified  [place] Obfuscation** Please advise 

## 2024-04-12 NOTE — Telephone Encounter (Signed)
 Her RBC count was just outside the normal range and likely not clinically significant however we can have her come back to recheck in a few weeks if she wishes.  Jinny Mounts. Daneil Dunker, MD 04/12/2024 1:31 PM

## 2024-04-18 ENCOUNTER — Other Ambulatory Visit

## 2024-04-21 DIAGNOSIS — F4322 Adjustment disorder with anxiety: Secondary | ICD-10-CM | POA: Diagnosis not present

## 2024-04-26 DIAGNOSIS — M47812 Spondylosis without myelopathy or radiculopathy, cervical region: Secondary | ICD-10-CM | POA: Diagnosis not present

## 2024-04-26 DIAGNOSIS — G894 Chronic pain syndrome: Secondary | ICD-10-CM | POA: Diagnosis not present

## 2024-04-26 DIAGNOSIS — M47816 Spondylosis without myelopathy or radiculopathy, lumbar region: Secondary | ICD-10-CM | POA: Diagnosis not present

## 2024-04-26 DIAGNOSIS — M961 Postlaminectomy syndrome, not elsewhere classified: Secondary | ICD-10-CM | POA: Diagnosis not present

## 2024-05-03 DIAGNOSIS — F4322 Adjustment disorder with anxiety: Secondary | ICD-10-CM | POA: Diagnosis not present

## 2024-05-16 ENCOUNTER — Encounter: Payer: Self-pay | Admitting: Family Medicine

## 2024-05-16 ENCOUNTER — Ambulatory Visit: Admitting: Family Medicine

## 2024-05-16 VITALS — BP 120/78 | HR 95 | Temp 97.9°F | Ht 62.4 in | Wt 118.2 lb

## 2024-05-16 DIAGNOSIS — I10 Essential (primary) hypertension: Secondary | ICD-10-CM | POA: Diagnosis not present

## 2024-05-16 DIAGNOSIS — G8929 Other chronic pain: Secondary | ICD-10-CM

## 2024-05-16 DIAGNOSIS — M5440 Lumbago with sciatica, unspecified side: Secondary | ICD-10-CM

## 2024-05-16 MED ORDER — DICLOFENAC SODIUM 75 MG PO TBEC: 75.0000 mg | DELAYED_RELEASE_TABLET | Freq: Two times a day (BID) | ORAL | 0 refills | Status: DC

## 2024-05-16 MED ORDER — METHYLPREDNISOLONE ACETATE 40 MG/ML IJ SUSP
40.0000 mg | Freq: Once | INTRAMUSCULAR | Status: AC
  Administered 2024-05-16: 80 mg via INTRAMUSCULAR

## 2024-05-16 NOTE — Patient Instructions (Signed)
 It was very nice to see you today!  We will give you an injection of Depo-Medrol  today.  Start the diclofenac .  Let us  know if not improving.  Return if symptoms worsen or fail to improve.   Take care, Dr Kennyth  PLEASE NOTE:  If you had any lab tests, please let us  know if you have not heard back within a few days. You may see your results on mychart before we have a chance to review them but we will give you a call once they are reviewed by us .   If we ordered any referrals today, please let us  know if you have not heard from their office within the next week.   If you had any urgent prescriptions sent in today, please check with the pharmacy within an hour of our visit to make sure the prescription was transmitted appropriately.   Please try these tips to maintain a healthy lifestyle:  Eat at least 3 REAL meals and 1-2 snacks per day.  Aim for no more than 5 hours between eating.  If you eat breakfast, please do so within one hour of getting up.   Each meal should contain half fruits/vegetables, one quarter protein, and one quarter carbs (no bigger than a computer mouse)  Cut down on sweet beverages. This includes juice, soda, and sweet tea.   Drink at least 1 glass of water with each meal and aim for at least 8 glasses per day  Exercise at least 150 minutes every week.

## 2024-05-16 NOTE — Assessment & Plan Note (Signed)
 Patient with flareup over the last few days.  No red flags on exam.  She is currently on oxycodone  per pain management.  We did discuss referral to orthopedics for further evaluation however she like to hold off on this for now.  We did discuss additional pain management options as well.  Will give 80 mg Depo-Medrol  today and start her on diclofenac .  We did discuss starting muscle relaxer however she would like to hold off on this due to concerns for oversedation.  She will let us  know if not improving in the next several days.  Consider referral back to orthopedics or sports medicine if not improving with above.

## 2024-05-16 NOTE — Progress Notes (Signed)
   Patricia Underwood is a 73 y.o. female who presents today for an office visit.  Assessment/Plan:  Chronic Problems Addressed Today: Chronic low back pain with sciatica Patient with flareup over the last few days.  No red flags on exam.  She is currently on oxycodone  per pain management.  We did discuss referral to orthopedics for further evaluation however she like to hold off on this for now.  We did discuss additional pain management options as well.  Will give 80 mg Depo-Medrol  today and start her on diclofenac .  We did discuss starting muscle relaxer however she would like to hold off on this due to concerns for oversedation.  She will let us  know if not improving in the next several days.  Consider referral back to orthopedics or sports medicine if not improving with above.  Essential hypertension At goal today without medications.      Subjective:  HPI:  See Assessment / plan for status of chronic conditions. Patient here today with low back pain. This started suddenly 5 days ago while at home.  She has been taking chronic oxycodone  per pain management without much improvement.  Pain predominantly located in the lower back.  Worse with certain motions.  Pain can be excruciating at times.  No reported bowel or bladder incontinence.  No reported urinary retention.       Objective:  Physical Exam: BP 120/78 (BP Location: Left Arm, Patient Position: Sitting, Cuff Size: Normal)   Pulse 95   Temp 97.9 F (36.6 C) (Temporal)   Ht 5' 2.4 (1.585 m)   Wt 118 lb 3.2 oz (53.6 kg)   SpO2 99%   BMI 21.34 kg/m   Gen: No acute distress, resting comfortably CV: Regular rate and rhythm with no murmurs appreciated Pulm: Normal work of breathing, clear to auscultation bilaterally with no crackles, wheezes, or rhonchi MUSCULOSKELETAL - Back: No deformities.  Tenderness palpation along bilateral lower lumbar paraspinal muscle groups. Neuro: Grossly normal, moves all extremities Psych: Normal affect  and thought content      Tyleah Loh M. Kennyth, MD 05/16/2024 12:20 PM

## 2024-05-16 NOTE — Assessment & Plan Note (Signed)
At goal today without medications. 

## 2024-05-25 DIAGNOSIS — M47812 Spondylosis without myelopathy or radiculopathy, cervical region: Secondary | ICD-10-CM | POA: Diagnosis not present

## 2024-05-25 DIAGNOSIS — M961 Postlaminectomy syndrome, not elsewhere classified: Secondary | ICD-10-CM | POA: Diagnosis not present

## 2024-05-25 DIAGNOSIS — G894 Chronic pain syndrome: Secondary | ICD-10-CM | POA: Diagnosis not present

## 2024-05-25 DIAGNOSIS — M47816 Spondylosis without myelopathy or radiculopathy, lumbar region: Secondary | ICD-10-CM | POA: Diagnosis not present

## 2024-06-02 ENCOUNTER — Ambulatory Visit: Admitting: Family Medicine

## 2024-06-02 DIAGNOSIS — F4322 Adjustment disorder with anxiety: Secondary | ICD-10-CM | POA: Diagnosis not present

## 2024-06-10 NOTE — Progress Notes (Signed)
 Office Visit Note  Patient: Patricia Underwood             Date of Birth: 08/06/1951           MRN: [id number]             PCP: Kennyth Worth HERO, MD Referring: Kennyth Worth HERO, MD Visit Date: 06/16/2024   Subjective:  Follow-up (Patient needs prednisone , she went to visit her sister who was recently diagnosed with cancer and forgot her medication in [place] . )   Discussed the use of AI scribe software for clinical note transcription with the patient, who gave verbal consent to proceed.  History of Present Illness   Patricia Underwood is a 73 y.o. female here for follow up for rheumatoid arthritis and sicca syndrome, currently on prednisone  10 mg daily increased from 5 mg daily due to worsened symptoms.    She experiences increased back pain, which has previously led to immobility. She reports back pain that causes difficulty with bending forward and backward. She has a history of L4-L5 issues and is concerned about a possible collapsed vertebrae above that level. She has not completed a recommended MRI due to personal circumstances and has not had a recent x-ray or MRI. She is currently taking prednisone , which was increased to 10 mg, but she left her prescription at her sister's place. She has not taken diclofenac , which was prescribed for her back pain, due to concerns about side effects and interactions with her other medications.  She reports hand pain, particularly in her thumbs where she had previous surgery. She experiences difficulty with tasks such as opening cans and is concerned about the integrity of the ligament that was previously repaired. She also mentions pain in a finger previously bitten by a dog, which was evaluated by a hand specialist. She reports morning stiffness and notes that her hands bother her a lot, especially in the morning.  She mentions a recent fall while pulling a garbage can, attributing it to not paying attention. She feels weak and attributes some of her physical  symptoms to her mental state following her husband's death. She has experienced a decrease in appetite and has lost a couple of pounds, though her weight fluctuates. She attributes some of her symptoms to mourning and stress related to family issues, including her sister's cancer. No recent viral or bacterial infections.      Previous HPI 01/18/2024 Patricia Underwood is a 74 y.o. female here for follow up for rheumatoid arthritis and sicca syndrome, currently on prednisone  10 mg daily increased from 5 mg daily due to worsened symptoms.   She experiences increased pain in her right hip, which has been problematic recently. The pain is severe enough to make walking difficult, and she experiences shooting pain consistent with sciatica, which she has had previously due to back surgery. Over the weekend, her symptoms improved slightly, but she had to hire a dog walker due to her inability to walk her dog. She uses a cane for balance when walking due to nervousness about falling.   She has a history of receiving steroid injections in both hips, with the most recent injection targeting the right hip approximately 2 weeks ago, which did not provide the expected relief. She attends physical therapy, where she performs leg exercises and receives heat therapy on her back. The pain is located on the side of her hip, with some improvement over the weekend. She also notes weakness in her leg but no foot pain.  She uses prednisone  daily, taking two tablets a day. She has tried using some kind of patches on her back, but found them ineffective and unpleasant due to their odor.   She has not had recent imaging of her hip but recalls an MRI from about a year and a half ago, which was done in a less than ideal setting and did not reveal significant findings. She also mentions a previous MRI or ultrasound that suggested the presence of bone spurs. Xrays form 2023 demonstrated osteoarthritis.   She experiences dryness and  irritation on her hands, which she attributes to frequent hand washing and scratching. She uses lubriderm as a moisturizer.      Previous HPI 11/16/2023 Patricia Underwood is a 73 y.o. female here for follow up for rheumatoid arthritis and sicca syndrome on prednisone  5 mg daily.  She presents with a recent exacerbation of symptoms. They report increased swelling and pain in their hands, particularly in the left hand, which they have been using more due to an injury to the right hand. The patient describes the swelling as significant, preventing them from wearing rings, and the pain as constant. They also note that the cold weather exacerbates the discomfort.   The patient has been on prednisone  5 mg daily, which they take at night. She tried taking gabapentin  that did not help. They also mention a previous trial of meloxicam, which did not provide any additional benefit.   In addition to the arthritis, the patient is recovering from an avulsion injury to the right hand caused by a dog bite. The injury involved the nail bed and a piece of the finger, leading to concerns about future range of motion. The patient has been advised to frequently bend the finger to prevent stiffness and loss of mobility.   The patient's arthritis and hand injury have been causing significant discomfort and functional impairment, prompting them to seek further treatment options.       08/19/2023 Patricia Underwood is a 73 y.o. female here for follow up for rheumatoid arthritis and sicca syndrome on prednisone  5 mg daily.  Her main complaint is increase in swelling pain and difficulty using her left hand.  This is problematic as it is her primary hand.  She had previous surgery for the tendon rupture that healed up well she saw Dr. Arlinda with hand surgery had x-ray showing advanced first [facility] joint osteoarthritis.  She is noticing swelling mostly around the MCP joints and more distally distributed throughout her hand, typically gets worse  after prolonged use and activity.  Is not having any symptoms of numbness or weakness.  Does not see any associated rash or skin change.   05/19/2023 Circe Chilton is a 73 y.o. female here for follow up follow-up for rheumatoid arthritis and sicca syndrome.  Since her last visit she is doing better with healing from her hand surgery still has ongoing daily joint pain and stiffness in multiple areas.  Not seeing a lot of visible swelling redness or warmth in affected sites.  Did not notice appreciable difference with trial of resuming the previously prescribed duloxetine .   01/05/23 Sakiyah Shur is a 73 y.o. female here for follow up for rheumatoid arthritis and sicca syndrome.  Evaluation at last visit without much peripheral joint synovitis noted recommended starting duloxetine  for osteoarthritis related pain.  Shortly after starting medication she noticed new headaches so she stopped taking it and these headaches resolved.  She also started taking the pilocarpine  again but she  already had medicine on hand this is partially helpful and not noticing any major side effect.  Hand surgery for the extensor tendon rupture with Dr. Sissy when okay she is currently wearing immobilizing brace which causes a lot of difficulty as she is left-hand dominant.  She is also been recommended to avoid a lot of walking around because of previous falls incurring hand and wrist injuries.  She notices increased back and bilateral leg pain with her decrease in walking and overall physical activity.  No definite flareup of joint swelling.  Still concerned about the bony nodules on her fingers and feet.    11/24/22 Shanon Seawright is a 73 y.o. female here for rheumatoid arthritis and sicca syndrome. She has a history of chronic joint pain in multiple areas with known  osteoarthritis and with fibromyalgia syndrome. There has been some question of sjogren syndrome due to ongoing issues with dry mouth and accelerated dental decay but with  negative serology. She saw Dr. Ziolkowska last year for evaluation findings showing no objective peripheral joint synovitis and serum inflammatory markers and RA related antibodies were negative. She has been treated with intermittent prednisone  which works well but the symptoms return when off treatment. More recently sustained a fall with associated wrist fracture and rupture of EPL tendon seeing hand surgery for this.  She does have somewhat generalized joint pains and morning stiffness lasting up to about 1 hour.  Typically does not see any obvious joint swelling or effusions.  She is not able to safely take oral nonsteroidal anti-inflammatory drugs. Dry mouth symptoms are pretty bothersome and she has experienced change in taste along with chronic irritation of the tongue.  She has been prescribed pilocarpine  previously but did not take the medicine very consistently due to somewhat cost prohibitive and also had increased sweating side effects.  She does not have any particular history with inflammatory eye complications does not generally notice lymphadenopathy or any swelling in the area near major salivary glands.   Review of Systems  Constitutional:  Positive for fatigue.  HENT:  Positive for mouth dryness. Negative for mouth sores.   Eyes:  Positive for dryness.  Respiratory:  Negative for shortness of breath.   Cardiovascular:  Negative for chest pain and palpitations.  Gastrointestinal:  Negative for blood in stool, constipation and diarrhea.  Endocrine: Negative for increased urination.  Genitourinary:  Negative for involuntary urination.  Musculoskeletal:  Positive for joint pain, gait problem, joint pain, joint swelling, myalgias, muscle weakness, morning stiffness and myalgias. Negative for muscle tenderness.  Skin:  Negative for color change, rash, hair loss and sensitivity to sunlight.  Allergic/Immunologic: Negative for susceptible to infections.  Neurological:  Negative for  dizziness and headaches.  Hematological:  Negative for swollen glands.  Psychiatric/Behavioral:  Positive for sleep disturbance. Negative for depressed mood. The patient is not nervous/anxious.     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   Essential hypertension 08/14/2022   Chronic low back pain with sciatica 09/25/2021   Cervical radiculopathy 09/25/2021   Alopecia 09/25/2021   Short-term memory loss 07/23/2021   Rheumatoid arthritis (HCC) 03/28/2021   Migraine 03/28/2021   Knee pain 02/14/2021   Anxiety 02/14/2021   Esophageal spasm    Aortic atherosclerosis (HCC)    Coronary artery disease due to calcified coronary lesion 07/30/2020   IGT (impaired  glucose tolerance) 03/20/2020   Chronic pain syndrome 07/04/2019   Dysphasia 04/21/2019   Sicca syndrome (HCC) 04/21/2019   Dental caries 04/21/2019   Osteoporosis 10/25/2018   Fibromyalgia 09/11/2018   Sensorineural hearing loss (SNHL) of both ears, followed by [facility], Delon Baumgartner 07/20/2018   Vitamin D  deficiency 07/05/2017   B12 deficiency, Rx B12 injections 07/05/2017   Insomnia 07/05/2017   Chronic right shoulder pain 05/30/2017   HLD (hyperlipidemia) 05/30/2017    Past Medical History:  Diagnosis Date   Aortic atherosclerosis (HCC)    Arthritis    B12 deficiency, Rx B12 injections 07/05/2017   Chronic back pain    Chronic right shoulder pain 05/30/2017   Coronary artery calcification seen on CAT scan    Diverticulitis    Diverticulosis    Esophageal spasm    Fibromyalgia    HLD (hyperlipidemia) 05/30/2017   HTN (hypertension)    Torn meniscus    right    Family History  Problem Relation Age of Onset   CAD Mother        died of massive MI at 36   Heart attack Mother    CVA Father        x 2   Colon cancer Father    Hyperlipidemia Sister    Stroke  Paternal Grandfather    Hypothyroidism Daughter    Hyperthyroidism Son    Diabetes Neg Hx    Past Surgical History:  Procedure Laterality Date   ABDOMINAL HYSTERECTOMY     LIGAMENT REPAIR Left 12/15/2022   Thumb   LUMBAR FUSION     L4-L5   SHOULDER ARTHROSCOPY Right    TONSILLECTOMY AND ADENOIDECTOMY     Social History   Social History Narrative   Not on file   Immunization History  Administered Date(s) Administered   Influenza-Unspecified 11/10/2018   Moderna Covid-19 Fall Seasonal Vaccine 29yrs & older 12/01/2022, 10/02/2023   PFIZER(Purple Top)SARS-COV-2 Vaccination 12/13/2019, 01/03/2020, 08/07/2020, 05/30/2021, 09/06/2021   Tdap 05/31/2013, 03/30/2022, 10/08/2023     Objective: Vital Signs: BP 134/81 (BP Location: Left Arm, Patient Position: Sitting, Cuff Size: Normal)   Pulse 91   Resp 16   Ht 5' 2 (1.575 m)   Wt 111 lb 3.2 oz (50.4 kg)   BMI 20.34 kg/m    Physical Exam Eyes:     Conjunctiva/sclera: Conjunctivae normal.  Cardiovascular:     Rate and Rhythm: Normal rate and regular rhythm.  Pulmonary:     Effort: Pulmonary effort is normal.     Breath sounds: Normal breath sounds.  Musculoskeletal:     Right lower leg: No edema.     Left lower leg: No edema.  Lymphadenopathy:     Cervical: No cervical adenopathy.  Skin:    General: Skin is warm and dry.     Findings: No rash.  Neurological:     Mental Status: She is alert.  Psychiatric:        Mood and Affect: Mood normal.      Musculoskeletal Exam:    Elbows full ROM no tenderness or swelling Wrists full ROM no tenderness or swelling Fingers with right hand DIP heberdon's nodes, left hand milder changes worst at 1st [facility], no palpable synovitis Back pain more over paraspinal muscles vs midline, no focal knots or nodules Right lateral hip pain provoked with internal and external rotation, and tenderness at greater trochanter and posterior Knees full ROM no tenderness or swelling Ankles full ROM  no tenderness or swelling  Investigation: No additional findings.  Imaging: No results found.  Recent Labs: Lab Results  Component Value Date   WBC 7.9 04/05/2024   HGB 12.1 04/05/2024   PLT 325.0 04/05/2024   NA 139 04/05/2024   K 3.6 04/05/2024   CL 102 04/05/2024   CO2 27 04/05/2024   GLUCOSE 98 04/05/2024   BUN 11 04/05/2024   CREATININE 0.51 04/05/2024   BILITOT 0.5 04/05/2024   ALKPHOS 73 04/05/2024   AST 20 04/05/2024   ALT 15 04/05/2024   PROT 6.2 04/05/2024   ALBUMIN 4.1 04/05/2024   CALCIUM  8.7 04/05/2024    Speciality Comments: No specialty comments available.  Procedures:  No procedures performed Allergies: Ambien [zolpidem tartrate], Flexeril [cyclobenzaprine], Norvasc  [amlodipine ], and Naproxen   Assessment / Plan:     Visit Diagnoses: Rheumatoid arthritis of multiple sites with negative rheumatoid factor (HCC) - Plan: predniSONE  (DELTASONE ) 10 MG tablet No significant peripheral joint synovitis at this time.  If anything joint swelling in her hands is decreased and she is on the higher dose of prednisone .  We discussed potential side effects with prolonged use of steroids but currently I think her benefit is outweighing the risk and avoiding requiring stronger alternate pain medications. Unfortunately has not been able to pursue significant local intervention for her back pain that is more limiting than peripheral arthritis symptoms.  Long term (current) use of systemic steroids - Prednisone  5 mg 1-2 tablets daily as needed Osteoporosis, unspecified osteoporosis type, unspecified pathological fracture presence - Plan: XR Lumbar Spine 2-3 Views Rechecking xray lumbar spine today that shows no acute changes. Pursuing MRI already ordered through PCP office. Prolia  treatment for osteoporosis also appropriate for potential GIOP.  Hand pain Chronic hand pain, particularly in the thumbs, with previous ligament removal surgery. Concerns about potential ligament  damage.  Depression Significant emotional distress with symptoms of decreased appetite, weight loss, weakness, and lack of focus. Probably contributing to overall pain level or at least to its impact on function.   Orders: Orders Placed This Encounter  Procedures   XR Lumbar Spine 2-3 Views   Meds ordered this encounter  Medications   DISCONTD: predniSONE  (DELTASONE ) 5 MG tablet    Sig: 1-2 tablets daily as needed    Dispense:  60 tablet    Refill:  2   predniSONE  (DELTASONE ) 10 MG tablet    Sig: Take 1 tablet (10 mg total) by mouth daily with breakfast.    Dispense:  90 tablet    Refill:  0     Follow-Up Instructions: Return in about 3 months (around 09/16/2024) for RA/OA on GC f/u 3mos.   Lonni LELON Ester, MD  Note - This record has been created using [employer].  Chart creation errors have been sought, but may not always  have been located. Such creation errors do not reflect on  the standard of medical care.

## 2024-06-16 ENCOUNTER — Ambulatory Visit

## 2024-06-16 ENCOUNTER — Encounter: Payer: Self-pay | Admitting: Internal Medicine

## 2024-06-16 ENCOUNTER — Ambulatory Visit: Attending: Internal Medicine | Admitting: Internal Medicine

## 2024-06-16 VITALS — BP 134/81 | HR 91 | Resp 16 | Ht 62.0 in | Wt 111.2 lb

## 2024-06-16 DIAGNOSIS — M3500 Sicca syndrome, unspecified: Secondary | ICD-10-CM | POA: Insufficient documentation

## 2024-06-16 DIAGNOSIS — M5440 Lumbago with sciatica, unspecified side: Secondary | ICD-10-CM | POA: Insufficient documentation

## 2024-06-16 DIAGNOSIS — M19042 Primary osteoarthritis, left hand: Secondary | ICD-10-CM | POA: Insufficient documentation

## 2024-06-16 DIAGNOSIS — M810 Age-related osteoporosis without current pathological fracture: Secondary | ICD-10-CM | POA: Diagnosis not present

## 2024-06-16 DIAGNOSIS — M25551 Pain in right hip: Secondary | ICD-10-CM | POA: Diagnosis not present

## 2024-06-16 DIAGNOSIS — G8929 Other chronic pain: Secondary | ICD-10-CM | POA: Insufficient documentation

## 2024-06-16 DIAGNOSIS — Z7952 Long term (current) use of systemic steroids: Secondary | ICD-10-CM | POA: Insufficient documentation

## 2024-06-16 DIAGNOSIS — M0609 Rheumatoid arthritis without rheumatoid factor, multiple sites: Secondary | ICD-10-CM | POA: Diagnosis not present

## 2024-06-16 DIAGNOSIS — M19041 Primary osteoarthritis, right hand: Secondary | ICD-10-CM | POA: Diagnosis not present

## 2024-06-16 MED ORDER — PREDNISONE 10 MG PO TABS: 10.0000 mg | ORAL_TABLET | Freq: Every day | ORAL | 0 refills | Status: DC

## 2024-06-16 MED ORDER — PREDNISONE 5 MG PO TABS: ORAL_TABLET | ORAL | 2 refills | Status: DC

## 2024-06-22 DIAGNOSIS — M47812 Spondylosis without myelopathy or radiculopathy, cervical region: Secondary | ICD-10-CM | POA: Diagnosis not present

## 2024-06-22 DIAGNOSIS — G894 Chronic pain syndrome: Secondary | ICD-10-CM | POA: Diagnosis not present

## 2024-06-22 DIAGNOSIS — M47816 Spondylosis without myelopathy or radiculopathy, lumbar region: Secondary | ICD-10-CM | POA: Diagnosis not present

## 2024-06-22 DIAGNOSIS — M961 Postlaminectomy syndrome, not elsewhere classified: Secondary | ICD-10-CM | POA: Diagnosis not present

## 2024-06-30 DIAGNOSIS — F4322 Adjustment disorder with anxiety: Secondary | ICD-10-CM | POA: Diagnosis not present

## 2024-07-12 ENCOUNTER — Ambulatory Visit: Admitting: Dermatology

## 2024-07-13 ENCOUNTER — Encounter (HOSPITAL_BASED_OUTPATIENT_CLINIC_OR_DEPARTMENT_OTHER): Payer: Self-pay | Admitting: Cardiology

## 2024-07-13 ENCOUNTER — Ambulatory Visit (HOSPITAL_BASED_OUTPATIENT_CLINIC_OR_DEPARTMENT_OTHER): Admitting: Cardiology

## 2024-07-13 VITALS — BP 138/84 | HR 94 | Resp 17 | Ht 62.0 in | Wt 110.0 lb

## 2024-07-13 DIAGNOSIS — E7800 Pure hypercholesterolemia, unspecified: Secondary | ICD-10-CM | POA: Diagnosis not present

## 2024-07-13 DIAGNOSIS — R0989 Other specified symptoms and signs involving the circulatory and respiratory systems: Secondary | ICD-10-CM

## 2024-07-13 DIAGNOSIS — I2510 Atherosclerotic heart disease of native coronary artery without angina pectoris: Secondary | ICD-10-CM

## 2024-07-13 DIAGNOSIS — Z8249 Family history of ischemic heart disease and other diseases of the circulatory system: Secondary | ICD-10-CM | POA: Diagnosis not present

## 2024-07-13 DIAGNOSIS — I2584 Coronary atherosclerosis due to calcified coronary lesion: Secondary | ICD-10-CM | POA: Diagnosis not present

## 2024-07-13 NOTE — Patient Instructions (Addendum)
 Medication Instructions:   Your physician recommends that you continue on your current medications as directed. Please refer to the Current Medication list given to you today. *If you need a refill on your cardiac medications before your next appointment, please call your pharmacy*  Lab Work:  ANYTIME THIS WEEK HERE AT [facility] ON THE 3RD FLOOR--LIPIDS--PLEASE COME FASTING TO THIS LAB APPOINTMENT  If you have labs (blood work) drawn today and your tests are completely normal, you will receive your results only by: MyChart Message (if you have MyChart) OR A paper copy in the mail If you have any lab test that is abnormal or we need to change your treatment, we will call you to review the results.    Follow-Up:  AS NEEDED WITH DR. LONNI  Other Instructions  Blood Pressure Record Sheet To take your blood pressure, you will need a blood pressure machine. You can buy a blood pressure machine (blood pressure monitor) at your clinic, drug store, or online. When choosing one, consider: An automatic monitor that has an arm cuff. A cuff that wraps snugly around your upper arm. You should be able to fit only one finger between your arm and the cuff. A device that stores blood pressure reading results. Do not choose a monitor that measures your blood pressure from your wrist or finger. Follow your health care provider's instructions for how to take your blood pressure. To use this form: Take your blood pressure medications every day These measurements should be taken when you have been at rest for at least 10-15 min Take at least 2 readings with each blood pressure check. This makes sure the results are correct. Wait 1-2 minutes between measurements. Write down the results in the spaces on this form. Keep in mind it should always be recorded systolic over diastolic. Both numbers are important.  Repeat this every day for 2-3 weeks, or as told by your health care provider.  Make a follow-up  appointment with your health care provider to discuss the results.  Blood Pressure Log Date Medications taken? (Y/N) Blood Pressure Time of Day                                                                                                                how to check blood pressure:  -sit comfortably in a chair, feet uncrossed and flat on floor, for 5-10 minutes  -arm ideally should rest at the level of the heart. However, arm should be relaxed and not tense (for example, do not hold the arm up unsupported)  -avoid exercise, caffeine, and tobacco for at least 30 minutes prior to BP reading  -don't take BP cuff reading over clothes (always place on skin directly)  If your numbers are consistently more than 140/90, let us  know and that is when we talk about medication.

## 2024-07-13 NOTE — Progress Notes (Signed)
 Cardiology Office Note:  .   Date:  07/13/2024  ID:  Patricia Underwood, DOB April 12, 1951, MRN [id number] PCP: Kennyth Worth HERO, MD  [facility] Providers Cardiologist:  Shelda Bruckner, MD {  History of Present Illness: .   Patricia Underwood is a 73 y.o. female with a hx of hyperlipidemia, coronary artery calcification who is seen for follow up today. I initially met her 07/28/2018 as a new consult at the request of Kennyth Worth HERO, MD for the evaluation and management of coronary artery calcium  seen on CT scan.   Pertinent CV history: Carotid dopplers 07/2021 with <50% stenosis. Calcium  score 07/2020 was 277, 87th %ile. Echo 07/2020 with EF 55-60%, G1DD, no significant valve disease. Nuclear stress 07/2020 low risk.  Today: Husband died a few months ago, offered my condolences. Planning to sell her home and move to [place]  to be with her children eventually. She is very busy preparing her house and yard, no limitations from cardiac standpoint, back pain is what limits her first. Goes up and down stairs slowly due to MSK issues, not from chest pain or shortness of breath.   Has noted that her blood pressure is consistently high. Was trialed on amlodipine  by Dr. Kennyth but has significant leg swelling. She thinks it started about a year and a half ago. At our visit 12/2022, her blood pressure was 100/58. At recent visit with Dr. Kennyth 05/16/2024, BP was 120/78.   Also concerned about her carotid arteries as she had family with a stroke. No loss of vision, notes that if she reads for a long time her vision can be blurry.   Has been on prednisone  chronically.  Had an episode in March when her blood pressure was 180 systolic when she checked it. Hasn't been checking routinely at home recently.  ROS: Denies chest pain, shortness of breath at rest or with normal exertion. No PND, orthopnea, LE edema or unexpected weight gain. No syncope or palpitations. ROS otherwise negative except as noted.   Studies  Reviewed: SABRA    EKG:       Physical Exam:   VS:  BP 138/84   Pulse 94   Resp 17   Ht 5' 2 (1.575 m)   Wt 110 lb (49.9 kg)   SpO2 98%   BMI 20.12 kg/m    Wt Readings from Last 3 Encounters:  07/13/24 110 lb (49.9 kg)  06/16/24 111 lb 3.2 oz (50.4 kg)  05/16/24 118 lb 3.2 oz (53.6 kg)    GEN: Well nourished, well developed in no acute distress HEENT: Normal, moist mucous membranes NECK: No JVD CARDIAC: regular rhythm, normal S1 and S2, no rubs or gallops. No murmur. VASCULAR: Radial and DP pulses 2+ bilaterally. No carotid bruits RESPIRATORY:  Clear to auscultation without rales, wheezing or rhonchi  ABDOMEN: Soft, non-tender, non-distended MUSCULOSKELETAL:  Ambulates independently SKIN: Warm and dry, no edema NEUROLOGIC:  Alert and oriented x 3. No focal neuro deficits noted. PSYCHIATRIC:  Normal affect    ASSESSMENT AND PLAN: .    Coronary artery calcification:  -no indication for additional testing given lack of symptoms -risk factor modification, as below   Hypercholesterolemia, possible familial hypercholesterolemia: Her family history is very strong, including one of her children having a Tchol >300.  -currently on atorvastatin  20 mg daily -last LDL 04/05/24 LDL 109. She is not sure how routinely she was taking atorvastatin  at that time but has been taking it more routinely since. Will recheck today. -doesn't eat  much in general, but eats generally healthy. reviewed heart heathy lifestyle as below. Does drink a lot of soda.   Labile blood pressure: discussed today. She will check at home and contact me if persistently elevated. Discussed risk of overtreatment if she is running lower at home than in the office.  CV risk counseling and prevention -recommend heart healthy/Mediterranean diet, with whole grains, fruits, vegetable, fish, lean meats, nuts, and olive oil. Limit salt. -recommend moderate walking, 3-5 times/week for 30-50 minutes each session. Aim for at least  150 minutes/week. Goal should be pace of 3 miles/hours, or walking 1.5 miles in 30 minutes -recommend avoidance of tobacco products. Avoid excess alcohol.    Dispo: as needed, as she plans to move to [place]  within the next year  Signed, Shelda Bruckner, MD   Shelda Bruckner, MD, PhD, [facility] [place]  [facility]  [place]  [facility] at [facility] at [facility] [address], [address] [place], [place] [address] [phone]

## 2024-07-14 DIAGNOSIS — F4322 Adjustment disorder with anxiety: Secondary | ICD-10-CM | POA: Diagnosis not present

## 2024-07-18 ENCOUNTER — Ambulatory Visit: Admitting: Internal Medicine

## 2024-07-21 DIAGNOSIS — M961 Postlaminectomy syndrome, not elsewhere classified: Secondary | ICD-10-CM | POA: Diagnosis not present

## 2024-07-21 DIAGNOSIS — M47812 Spondylosis without myelopathy or radiculopathy, cervical region: Secondary | ICD-10-CM | POA: Diagnosis not present

## 2024-07-21 DIAGNOSIS — G894 Chronic pain syndrome: Secondary | ICD-10-CM | POA: Diagnosis not present

## 2024-07-21 DIAGNOSIS — M47816 Spondylosis without myelopathy or radiculopathy, lumbar region: Secondary | ICD-10-CM | POA: Diagnosis not present

## 2024-07-22 ENCOUNTER — Encounter: Payer: Self-pay | Admitting: Family Medicine

## 2024-07-22 ENCOUNTER — Ambulatory Visit: Payer: Self-pay | Admitting: Family Medicine

## 2024-07-22 ENCOUNTER — Ambulatory Visit (HOSPITAL_COMMUNITY)
Admission: RE | Admit: 2024-07-22 | Discharge: 2024-07-22 | Disposition: A | Discharge status: Home / Self Care | Source: Ambulatory Visit | Attending: Family Medicine | Admitting: Family Medicine

## 2024-07-22 ENCOUNTER — Ambulatory Visit (INDEPENDENT_AMBULATORY_CARE_PROVIDER_SITE_OTHER): Admitting: Family Medicine

## 2024-07-22 VITALS — BP 113/73 | HR 91 | Temp 98.0°F | Ht 62.0 in | Wt 113.4 lb

## 2024-07-22 DIAGNOSIS — M79604 Pain in right leg: Secondary | ICD-10-CM | POA: Insufficient documentation

## 2024-07-22 DIAGNOSIS — G8929 Other chronic pain: Secondary | ICD-10-CM

## 2024-07-22 DIAGNOSIS — I10 Essential (primary) hypertension: Secondary | ICD-10-CM

## 2024-07-22 DIAGNOSIS — M5440 Lumbago with sciatica, unspecified side: Secondary | ICD-10-CM | POA: Diagnosis not present

## 2024-07-22 NOTE — Addendum Note (Signed)
 Addended by: IDA ELORA HERO on: 07/22/2024 11:49 AM   Modules accepted: Orders

## 2024-07-22 NOTE — Patient Instructions (Signed)
 It was very nice to see you today!  VISIT SUMMARY: Today, you came in with concerns about possible thrombosis in your right leg. You have been experiencing warmth, aching, and discomfort in the back of your knee and ankle over the past four days.  YOUR PLAN: SUSPECTED RIGHT LOWER EXTREMITY DEEP VEIN THROMBOSIS: There is a low suspicion for deep vein thrombosis (DVT), but we need to rule it out. -We have ordered an urgent ultrasound of your right leg to check for DVT. Please go to the imaging center today for the ultrasound. -If DVT is confirmed, we will start you on anticoagulation treatment immediately.  Return if symptoms worsen or fail to improve.   Take care, Dr Kennyth  PLEASE NOTE:  If you had any lab tests, please let us  know if you have not heard back within a few days. You may see your results on mychart before we have a chance to review them but we will give you a call once they are reviewed by us .   If we ordered any referrals today, please let us  know if you have not heard from their office within the next week.   If you had any urgent prescriptions sent in today, please check with the pharmacy within an hour of our visit to make sure the prescription was transmitted appropriately.   Please try these tips to maintain a healthy lifestyle:  Eat at least 3 REAL meals and 1-2 snacks per day.  Aim for no more than 5 hours between eating.  If you eat breakfast, please do so within one hour of getting up.   Each meal should contain half fruits/vegetables, one quarter protein, and one quarter carbs (no bigger than a computer mouse)  Cut down on sweet beverages. This includes juice, soda, and sweet tea.   Drink at least 1 glass of water with each meal and aim for at least 8 glasses per day  Exercise at least 150 minutes every week.

## 2024-07-22 NOTE — Assessment & Plan Note (Signed)
 At goal today without medications.

## 2024-07-22 NOTE — Progress Notes (Signed)
 Good news!  Ultrasound was negative for DVT.  She should continue to monitor symptoms at home and let us  know if not improving over the next several days.

## 2024-07-22 NOTE — Assessment & Plan Note (Signed)
 May be contributing to her above leg pain however we are checking ultrasound as above.  She is currently following with pain management and is on Percocet daily.

## 2024-07-22 NOTE — Progress Notes (Signed)
   Patricia Underwood is a 73 y.o. female who presents today for an office visit.  Assessment/Plan:  New/Acute Problems: Right Leg Pain Overall low suspicion for DVT.  Wells score 2.  Will check stat ultrasound to rule out DVT.  Chronic Problems Addressed Today: Essential hypertension At goal today without medications.   Chronic low back pain with sciatica May be contributing to her above leg pain however we are checking ultrasound as above.  She is currently following with pain management and is on Percocet daily.     Subjective:  HPI:  See assessment / plan for status of chronic conditions.   Discussed the use of AI scribe software for clinical note transcription with the patient, who gave verbal consent to proceed.  History of Present Illness Patricia Underwood is a 73 year old female who presents with concerns of possible thrombosis in her right leg.  Approximately six weeks ago, she tripped with her right foot while going out to shake her dog and fell onto some fencing chicken wires, resulting in multiple scratches. Three weeks ago, she scraped the same leg again while trimming bushes in her front yard. About a week ago, she noticed a potential infection in one of the scratches, which she managed herself.  Over the past four days, she has experienced warmth, aching, and discomfort in the back of her knee and ankle on the right leg. She notes the appearance of new veins, describing them as 'traumatized'. No swelling is present, but she mentions having 'very skinny legs'. She is concerned about the possibility of thrombosis, especially since she lives alone. She has been taking short daily walks with her dog.         Objective:  Physical Exam: BP 113/73   Pulse 91   Temp 98 F (36.7 C) (Temporal)   Ht 5' 2 (1.575 m)   Wt 113 lb 6.4 oz (51.4 kg)   SpO2 97%   BMI 20.74 kg/m   Gen: No acute distress, resting comfortably CV: Regular rate and rhythm with no murmurs appreciated Pulm:  Normal work of breathing, clear to auscultation bilaterally with no crackles, wheezes, or rhonchi MUSCULOSKELETAL: - Right Leg: Varicose veins noted along distal right leg.  Tender to palpation along posterior lateral aspect.  No edema.  Neurovascular intact distally. Neuro: Grossly normal, moves all extremities Psych: Normal affect and thought content      Sumer Moorehouse M. Kennyth, MD 07/22/2024 11:15 AM

## 2024-07-24 ENCOUNTER — Encounter: Payer: Self-pay | Admitting: Family Medicine

## 2024-07-25 NOTE — Telephone Encounter (Signed)
 See note

## 2024-08-17 ENCOUNTER — Other Ambulatory Visit: Payer: Self-pay | Admitting: Family Medicine

## 2024-08-17 NOTE — Telephone Encounter (Signed)
 Refill request for triazolam  0.25mg  60tab 5refills. Last fill 02/04/2024. Last OV 07/22/2024.

## 2024-08-17 NOTE — Telephone Encounter (Unsigned)
 Copied from CRM [id number]. Topic: Clinical - Medication Refill >> Aug 17, 2024 10:24 AM Robinson H wrote: Medication: triazolam  (HALCION ) 0.25 MG tablet   Has the patient contacted their pharmacy? No, states it's too complicated (Agent: If no, request that the patient contact the pharmacy for the refill. If patient does not wish to contact the pharmacy document the reason why and proceed with request.) (Agent: If yes, when and what did the pharmacy advise?)  This is the patient's preferred pharmacy:  [facility] [id number] [place], [place] - [address] [place] [address] Phone: [phone] Fax: [phone]  Is this the correct pharmacy for this prescription? Yes If no, delete pharmacy and type the correct one.   Has the prescription been filled recently? No  Is the patient out of the medication? No  Has the patient been seen for an appointment in the last year OR does the patient have an upcoming appointment? Yes  Can we respond through MyChart? Yes  Agent: Please be advised that Rx refills may take up to 3 business days. We ask that you follow-up with your pharmacy.

## 2024-08-18 ENCOUNTER — Ambulatory Visit
Admission: RE | Admit: 2024-08-18 | Discharge: 2024-08-18 | Disposition: A | Source: Ambulatory Visit | Attending: Physical Medicine and Rehabilitation | Admitting: Physical Medicine and Rehabilitation

## 2024-08-18 ENCOUNTER — Other Ambulatory Visit: Payer: Self-pay | Admitting: Physical Medicine and Rehabilitation

## 2024-08-18 DIAGNOSIS — M47817 Spondylosis without myelopathy or radiculopathy, lumbosacral region: Secondary | ICD-10-CM | POA: Diagnosis not present

## 2024-08-18 DIAGNOSIS — M5416 Radiculopathy, lumbar region: Secondary | ICD-10-CM

## 2024-08-18 DIAGNOSIS — M5126 Other intervertebral disc displacement, lumbar region: Secondary | ICD-10-CM | POA: Diagnosis not present

## 2024-08-18 MED ORDER — TRIAZOLAM 0.25 MG PO TABS: 0.5000 mg | ORAL_TABLET | Freq: Every evening | ORAL | 5 refills | Status: AC | PRN

## 2024-08-24 DIAGNOSIS — F4322 Adjustment disorder with anxiety: Secondary | ICD-10-CM | POA: Diagnosis not present

## 2024-08-31 DIAGNOSIS — F4322 Adjustment disorder with anxiety: Secondary | ICD-10-CM | POA: Diagnosis not present

## 2024-09-02 NOTE — Progress Notes (Signed)
 Office Visit Note  Patient: Patricia Underwood             Date of Birth: 02-05-1951           MRN: [id number]             PCP: Kennyth Worth HERO, MD Referring: Kennyth Worth HERO, MD Visit Date: 09/16/2024   Subjective:  Medical Management of Chronic Issues (Patient would like to discuss MRI she had done 08/18/2024)   Discussed the use of AI scribe software for clinical note transcription with the patient, who gave verbal consent to proceed.  History of Present Illness   Patricia Underwood is a 73 y.o. female here for follow up for rheumatoid arthritis and sicca syndrome, currently on prednisone  10 mg daily increased from 5 mg daily due to worsened symptoms.   She experiences persistent back pain primarily on the right side, with episodes of sciatica. The pain worsens with physical activity, necessitating the use of a back brace for support. She reports that her MRI mentioned bulging discs and degenerative changes in her back. She has a history of spinal surgery with hardware at L4 and L5.  She has difficulty straightening her back due to pain, which is often located in the middle and lower back. Her back feels 'always so tight' and it is 'very hard to be straight' due to discomfort. She experiences occasional sciatica and notes weakness in her calves. There is tenderness in the hip area, particularly on the side, which she attributes to muscle soreness.  She is on prednisone  10 mg daily, previously taking 5 mg, but is unsure if the higher dose has been more beneficial. She has received three hip injections, which she believes made a difference. She is also on Prolia  for osteoporosis.  She attempts to stay active by taking walks with her dog and has previously engaged in physical therapy. She expresses frustration with her current condition, stating, 'I wish I never did it,' referring to her past back surgery.      08/18/24 MRI Lumbar spine IMPRESSION: 1. Stable postsurgical changes at L4-L5 with no  adverse features. 2. Stable Adjacent segment degeneration at both L3-L4 and L5-S1 with no spinal or lateral recess stenosis. 3. Moderate neural foraminal stenosis is stable at L2-L3 and L3-L4.  Previous HPI 06/16/2024 Patricia Underwood is a 73 y.o. female here for follow up for rheumatoid arthritis and sicca syndrome, currently on prednisone  10 mg daily increased from 5 mg daily due to worsened symptoms.     She experiences increased back pain, which has previously led to immobility. She reports back pain that causes difficulty with bending forward and backward. She has a history of L4-L5 issues and is concerned about a possible collapsed vertebrae above that level. She has not completed a recommended MRI due to personal circumstances and has not had a recent x-ray or MRI. She is currently taking prednisone , which was increased to 10 mg, but she left her prescription at her sister's place. She has not taken diclofenac , which was prescribed for her back pain, due to concerns about side effects and interactions with her other medications.   She reports hand pain, particularly in her thumbs where she had previous surgery. She experiences difficulty with tasks such as opening cans and is concerned about the integrity of the ligament that was previously repaired. She also mentions pain in a finger previously bitten by a dog, which was evaluated by a hand specialist. She reports morning stiffness and notes  that her hands bother her a lot, especially in the morning.   She mentions a recent fall while pulling a garbage can, attributing it to not paying attention. She feels weak and attributes some of her physical symptoms to her mental state following her husband's death. She has experienced a decrease in appetite and has lost a couple of pounds, though her weight fluctuates. She attributes some of her symptoms to mourning and stress related to family issues, including her sister's cancer. No recent viral or bacterial  infections.       Previous HPI 01/18/2024 Patricia Underwood is a 73 y.o. female here for follow up for rheumatoid arthritis and sicca syndrome, currently on prednisone  10 mg daily increased from 5 mg daily due to worsened symptoms.   She experiences increased pain in her right hip, which has been problematic recently. The pain is severe enough to make walking difficult, and she experiences shooting pain consistent with sciatica, which she has had previously due to back surgery. Over the weekend, her symptoms improved slightly, but she had to hire a dog walker due to her inability to walk her dog. She uses a cane for balance when walking due to nervousness about falling.   She has a history of receiving steroid injections in both hips, with the most recent injection targeting the right hip approximately 2 weeks ago, which did not provide the expected relief. She attends physical therapy, where she performs leg exercises and receives heat therapy on her back. The pain is located on the side of her hip, with some improvement over the weekend. She also notes weakness in her leg but no foot pain.   She uses prednisone  daily, taking two tablets a day. She has tried using some kind of patches on her back, but found them ineffective and unpleasant due to their odor.   She has not had recent imaging of her hip but recalls an MRI from about a year and a half ago, which was done in a less than ideal setting and did not reveal significant findings. She also mentions a previous MRI or ultrasound that suggested the presence of bone spurs. Xrays form 2023 demonstrated osteoarthritis.   She experiences dryness and irritation on her hands, which she attributes to frequent hand washing and scratching. She uses lubriderm as a moisturizer.      Previous HPI 11/16/2023 Patricia Underwood is a 73 y.o. female here for follow up for rheumatoid arthritis and sicca syndrome on prednisone  5 mg daily.  She presents with a recent  exacerbation of symptoms. They report increased swelling and pain in their hands, particularly in the left hand, which they have been using more due to an injury to the right hand. The patient describes the swelling as significant, preventing them from wearing rings, and the pain as constant. They also note that the cold weather exacerbates the discomfort.   The patient has been on prednisone  5 mg daily, which they take at night. She tried taking gabapentin  that did not help. They also mention a previous trial of meloxicam, which did not provide any additional benefit.   In addition to the arthritis, the patient is recovering from an avulsion injury to the right hand caused by a dog bite. The injury involved the nail bed and a piece of the finger, leading to concerns about future range of motion. The patient has been advised to frequently bend the finger to prevent stiffness and loss of mobility.   The patient's arthritis and  hand injury have been causing significant discomfort and functional impairment, prompting them to seek further treatment options.       08/19/2023 Peggi Yono is a 73 y.o. female here for follow up for rheumatoid arthritis and sicca syndrome on prednisone  5 mg daily.  Her main complaint is increase in swelling pain and difficulty using her left hand.  This is problematic as it is her primary hand.  She had previous surgery for the tendon rupture that healed up well she saw Dr. Arlinda with hand surgery had x-ray showing advanced first [facility] joint osteoarthritis.  She is noticing swelling mostly around the MCP joints and more distally distributed throughout her hand, typically gets worse after prolonged use and activity.  Is not having any symptoms of numbness or weakness.  Does not see any associated rash or skin change.   05/19/2023 Dominigue Gellner is a 73 y.o. female here for follow up follow-up for rheumatoid arthritis and sicca syndrome.  Since her last visit she is doing better with  healing from her hand surgery still has ongoing daily joint pain and stiffness in multiple areas.  Not seeing a lot of visible swelling redness or warmth in affected sites.  Did not notice appreciable difference with trial of resuming the previously prescribed duloxetine .   01/05/23 Tifani Dack is a 73 y.o. female here for follow up for rheumatoid arthritis and sicca syndrome.  Evaluation at last visit without much peripheral joint synovitis noted recommended starting duloxetine  for osteoarthritis related pain.  Shortly after starting medication she noticed new headaches so she stopped taking it and these headaches resolved.  She also started taking the pilocarpine  again but she already had medicine on hand this is partially helpful and not noticing any major side effect.  Hand surgery for the extensor tendon rupture with Dr. Sissy when okay she is currently wearing immobilizing brace which causes a lot of difficulty as she is left-hand dominant.  She is also been recommended to avoid a lot of walking around because of previous falls incurring hand and wrist injuries.  She notices increased back and bilateral leg pain with her decrease in walking and overall physical activity.  No definite flareup of joint swelling.  Still concerned about the bony nodules on her fingers and feet.    11/24/22 Leyla Soliz is a 73 y.o. female here for rheumatoid arthritis and sicca syndrome. She has a history of chronic joint pain in multiple areas with known  osteoarthritis and with fibromyalgia syndrome. There has been some question of sjogren syndrome due to ongoing issues with dry mouth and accelerated dental decay but with negative serology. She saw Dr. Ziolkowska last year for evaluation findings showing no objective peripheral joint synovitis and serum inflammatory markers and RA related antibodies were negative. She has been treated with intermittent prednisone  which works well but the symptoms return when off treatment.  More recently sustained a fall with associated wrist fracture and rupture of EPL tendon seeing hand surgery for this.  She does have somewhat generalized joint pains and morning stiffness lasting up to about 1 hour.  Typically does not see any obvious joint swelling or effusions.  She is not able to safely take oral nonsteroidal anti-inflammatory drugs. Dry mouth symptoms are pretty bothersome and she has experienced change in taste along with chronic irritation of the tongue.  She has been prescribed pilocarpine  previously but did not take the medicine very consistently due to somewhat cost prohibitive and also had increased sweating side effects.  She does not have any particular history with inflammatory eye complications does not generally notice lymphadenopathy or any swelling in the area near major salivary glands.   Review of Systems  Constitutional:  Negative for fatigue.  HENT:  Positive for mouth dryness. Negative for mouth sores.   Eyes:  Positive for dryness.  Respiratory:  Negative for shortness of breath.   Cardiovascular:  Negative for chest pain and palpitations.  Gastrointestinal:  Negative for blood in stool, constipation and diarrhea.  Endocrine: Negative for increased urination.  Genitourinary:  Negative for involuntary urination.  Musculoskeletal:  Positive for joint pain, joint pain, joint swelling, myalgias, morning stiffness, muscle tenderness and myalgias. Negative for gait problem and muscle weakness.  Skin:  Negative for color change, rash, hair loss and sensitivity to sunlight.  Allergic/Immunologic: Negative for susceptible to infections.  Neurological:  Negative for dizziness and headaches.  Hematological:  Negative for swollen glands.  Psychiatric/Behavioral:  Positive for sleep disturbance. Negative for depressed mood. The patient is not nervous/anxious.     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   Essential hypertension 08/14/2022   Chronic low back pain with sciatica 09/25/2021   Cervical radiculopathy 09/25/2021   Alopecia 09/25/2021   Short-term memory loss 07/23/2021   Rheumatoid arthritis (HCC) 03/28/2021   Migraine 03/28/2021   Knee pain 02/14/2021   Anxiety 02/14/2021   Esophageal spasm    Aortic atherosclerosis    Coronary artery disease due to calcified coronary lesion 07/30/2020   IGT (impaired glucose tolerance) 03/20/2020   Chronic pain syndrome 07/04/2019   Dysphasia 04/21/2019   Sicca syndrome 04/21/2019   Dental caries 04/21/2019   Osteoporosis 10/25/2018   Fibromyalgia 09/11/2018   Sensorineural hearing loss (SNHL) of both ears, followed by [facility], [facility] 07/20/2018   Vitamin D  deficiency 07/05/2017   B12 deficiency, Rx B12 injections 07/05/2017   Insomnia 07/05/2017   Chronic right shoulder pain 05/30/2017   HLD (hyperlipidemia) 05/30/2017    Past Medical History:  Diagnosis Date   Aortic atherosclerosis    Arthritis    B12 deficiency, Rx B12 injections 07/05/2017   Chronic back pain    Chronic right shoulder pain 05/30/2017   Coronary artery calcification seen on CAT scan    Diverticulitis    Diverticulosis    Esophageal spasm    Fibromyalgia    HLD (hyperlipidemia) 05/30/2017   HTN (hypertension)    Torn meniscus    right    Family History  Problem Relation Age of Onset   CAD Mother        died of massive MI at 45   Heart attack Mother    CVA Father        x 2   Colon cancer Father    Hyperlipidemia Sister    Stroke Paternal Grandfather    Hypothyroidism Daughter    Hyperthyroidism Son    Diabetes Neg Hx    Past Surgical History:  Procedure Laterality Date   ABDOMINAL HYSTERECTOMY     LIGAMENT REPAIR Left 12/15/2022   Thumb   LUMBAR FUSION     L4-L5   SHOULDER ARTHROSCOPY Right    TONSILLECTOMY AND ADENOIDECTOMY     Social  History   Social History Narrative   Not on file   Immunization History  Administered Date(s) Administered   Influenza-Unspecified 11/10/2018  Moderna Covid-19 Fall Seasonal Vaccine 31yrs & older 12/01/2022, 10/02/2023   PFIZER(Purple Top)SARS-COV-2 Vaccination 12/13/2019, 01/03/2020, 08/07/2020, 05/30/2021, 09/06/2021   Tdap 05/31/2013, 03/30/2022, 10/08/2023     Objective: Vital Signs: BP (!) 145/83   Pulse 91   Temp 97.9 F (36.6 C)   Resp 16   Ht 5' 2 (1.575 m)   Wt 114 lb 3.2 oz (51.8 kg)   BMI 20.89 kg/m    Physical Exam Eyes:     Conjunctiva/sclera: Conjunctivae normal.  Cardiovascular:     Rate and Rhythm: Normal rate and regular rhythm.  Pulmonary:     Effort: Pulmonary effort is normal.     Breath sounds: Normal breath sounds.  Lymphadenopathy:     Cervical: No cervical adenopathy.  Skin:    General: Skin is warm and dry.  Neurological:     Mental Status: She is alert.  Psychiatric:        Mood and Affect: Mood normal.      Musculoskeletal Exam:  Elbows full ROM no tenderness or swelling Wrists full ROM no tenderness or swelling Fingers with right hand DIP heberdon's nodes, left hand milder changes worst at 1st C[facility], no palpable synovitis Back pain more over paraspinal muscles vs midline, no focal knots or nodules Right lateral hip pain provoked with internal and external rotation, and tenderness at greater trochanter and posterior Knees full ROM no tenderness or swelling Ankles full ROM no tenderness or swelling   Investigation: No additional findings.  Imaging: No results found.   Recent Labs: Lab Results  Component Value Date   WBC 7.9 04/05/2024   HGB 12.1 04/05/2024   PLT 325.0 04/05/2024   NA 139 04/05/2024   K 3.6 04/05/2024   CL 102 04/05/2024   CO2 27 04/05/2024   GLUCOSE 98 04/05/2024   BUN 11 04/05/2024   CREATININE 0.51 04/05/2024   BILITOT 0.5 04/05/2024   ALKPHOS 73 04/05/2024   AST 20 04/05/2024   ALT 15 04/05/2024    PROT 6.2 04/05/2024   ALBUMIN 4.1 04/05/2024   CALCIUM  8.7 04/05/2024    Speciality Comments: No specialty comments available.  Procedures:  No procedures performed Allergies: Ambien [zolpidem tartrate], Flexeril [cyclobenzaprine], Norvasc  [amlodipine ], and Naproxen   Assessment / Plan:     Visit Diagnoses: Rheumatoid arthritis of multiple sites with negative rheumatoid factor (HCC) - Plan: predniSONE  (DELTASONE ) 5 MG tablet Rheumatoid arthritis managed with prednisone  10 mg daily. Uncertain if increased dose provides significant benefit. Long-term steroid use can lower bone density, a concern given her osteoporosis. - Rechallenge with lower dose of prednisone  to assess symptom control. - Monitor bone density due to long-term steroid use.  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 MRI shows degenerative changes at L3-L4 and L5-S1 with disc bulging at L3-L4, potentially causing nerve impingement. Pain may be multifactorial, including degenerative changes and compensatory muscle strain. - Follow up with back specialist for further evaluation and management. - Consider physical therapy for muscle strengthening and posture correction. - Discuss potential for targeted injections or procedures with back specialist.  Osteoporosis Managed with Prolia  injections. Steroid injections for arthritis are not contraindicated due to Prolia  use. Weight-bearing activity is crucial for bone health. - Continue Prolia  injections. - Encouraged weight-bearing activities to improve bone health.  Left gluteus medius muscle pain and weakness Pain and weakness in the left gluteus medius  muscle, likely contributing to hip pain and altered gait. - Incorporate exercises targeting the gluteus medius, such as side leg raises and clam shells.         Orders: No orders of the defined types were placed in this encounter.  Meds ordered this encounter  Medications   predniSONE  (DELTASONE ) 5 MG tablet    Sig: Take 1 tablet (5 mg total) by mouth daily with breakfast.    Dispense:  90 tablet    Refill:  1     Follow-Up Instructions: Return in about 6 months (around 03/16/2025) for RA/OA on GC f/u 6mos.   Lonni LELON Ester, MD  Note - This record has been created using [employer].  Chart creation errors have been sought, but may not always  have been located. Such creation errors do not reflect on  the standard of medical care.

## 2024-09-05 ENCOUNTER — Telehealth: Payer: Self-pay

## 2024-09-05 NOTE — Telephone Encounter (Signed)
 Prolia  VOB initiated via [email or web address]  Next Prolia  inj DUE: 10/06/24

## 2024-09-06 ENCOUNTER — Other Ambulatory Visit (HOSPITAL_COMMUNITY): Payer: Self-pay

## 2024-09-06 NOTE — Telephone Encounter (Signed)
 SABRA

## 2024-09-06 NOTE — Telephone Encounter (Signed)
 Pt ready for scheduling for PROLIA  on or after : 10/06/24  Option# 1: Buy/Bill (Office supplied medication)  Out-of-pocket cost due at time of clinic visit: $0  Number of injection/visits approved: ---  Primary: MEDICARE Prolia  co-insurance: 0% Admin fee co-insurance: 0%  Secondary: [employer] Prolia  co-insurance:  Admin fee co-insurance:   Medical Benefit Details: Date Benefits were checked: 09/05/24 Deductible: $257 Met of $257 Required/ Coinsurance: 0%/ Admin Fee: 0%  Prior Auth: N/A PA# Expiration Date:   # of doses approved: ----------------------------------------------------------------------- Option# 2- Med Obtained from pharmacy:  Pharmacy benefit: Copay $740.77 (Paid to pharmacy) Admin Fee: 0% (Pay at clinic)  Prior Auth: N/A PA# Expiration Date:   # of doses approved:   If patient wants fill through the pharmacy benefit please send prescription to: [facility], and include estimated need by date in rx notes. Pharmacy will ship medication directly to the office.  Patient NOT eligible for Prolia  Copay Card. Copay Card can make patient's cost as little as $25. Link to apply: [email or web address]://[email or web address]  ** This summary of benefits is an estimation of the patient's out-of-pocket cost. Exact cost may very based on individual plan coverage.

## 2024-09-16 ENCOUNTER — Ambulatory Visit: Attending: Internal Medicine | Admitting: Internal Medicine

## 2024-09-16 ENCOUNTER — Encounter: Payer: Self-pay | Admitting: Internal Medicine

## 2024-09-16 VITALS — BP 145/83 | HR 91 | Temp 97.9°F | Resp 16 | Ht 62.0 in | Wt 114.2 lb

## 2024-09-16 DIAGNOSIS — M0609 Rheumatoid arthritis without rheumatoid factor, multiple sites: Secondary | ICD-10-CM | POA: Diagnosis not present

## 2024-09-16 DIAGNOSIS — Z7952 Long term (current) use of systemic steroids: Secondary | ICD-10-CM | POA: Diagnosis not present

## 2024-09-16 DIAGNOSIS — M5441 Lumbago with sciatica, right side: Secondary | ICD-10-CM | POA: Diagnosis present

## 2024-09-16 DIAGNOSIS — M810 Age-related osteoporosis without current pathological fracture: Secondary | ICD-10-CM | POA: Insufficient documentation

## 2024-09-16 DIAGNOSIS — M797 Fibromyalgia: Secondary | ICD-10-CM | POA: Diagnosis not present

## 2024-09-16 DIAGNOSIS — G8929 Other chronic pain: Secondary | ICD-10-CM | POA: Diagnosis not present

## 2024-09-16 MED ORDER — PREDNISONE 5 MG PO TABS: 5.0000 mg | ORAL_TABLET | Freq: Every day | ORAL | 1 refills | Status: DC

## 2024-09-16 NOTE — Patient Instructions (Signed)
 Straight leg raises, side-lying This exercise strengthens the muscles that move the hip joint away from the center of the body (hip abductors). Lie on your side with your left / right leg in the top position. Lie so your head, shoulder, hip, and knee line up. You may bend your bottom knee slightly to help you balance. Roll your hips slightly forward, so your hips are stacked directly over each other and your left / right knee is facing forward. Leading with your heel, lift your top leg 4-6 inches (10-15 cm). You should feel the muscles in your top hip lifting. Do not let your foot drift forward. Do not let your knee roll toward the ceiling. Hold this position for __________ seconds. Slowly return to the starting position. Let your muscles relax completely between repetitions. Repeat __________ times. Complete this exercise __________ times a day.  Hip hike  Stand sideways on a bottom step. Stand on your left / right leg with your other foot unsupported next to the step. You can hold on to the railing or wall for balance if needed. Keep your knees straight and your torso square. Then lift your left / right hip up toward the ceiling. Hold this position for __________ seconds. Slowly let your left / right hip lower toward the floor, past the starting position. Your foot should get closer to the floor. Do not lean or bend your knees. Repeat __________ times. Complete this exercise __________ times a day.

## 2024-09-19 DIAGNOSIS — G894 Chronic pain syndrome: Secondary | ICD-10-CM | POA: Diagnosis not present

## 2024-09-19 DIAGNOSIS — M47816 Spondylosis without myelopathy or radiculopathy, lumbar region: Secondary | ICD-10-CM | POA: Diagnosis not present

## 2024-09-19 DIAGNOSIS — M47812 Spondylosis without myelopathy or radiculopathy, cervical region: Secondary | ICD-10-CM | POA: Diagnosis not present

## 2024-09-19 DIAGNOSIS — M961 Postlaminectomy syndrome, not elsewhere classified: Secondary | ICD-10-CM | POA: Diagnosis not present

## 2024-09-19 NOTE — Telephone Encounter (Signed)
 Pt is scheduled for Prolia  on 09/27/24

## 2024-09-20 NOTE — Telephone Encounter (Signed)
 09/27/24 is too soon and won't be covered by insurance. Please re-schedule for PROLIA  on or after : 10/06/24

## 2024-09-21 NOTE — Telephone Encounter (Signed)
 Appt moved to 10/11/24

## 2024-09-27 ENCOUNTER — Ambulatory Visit

## 2024-09-29 DIAGNOSIS — M6281 Muscle weakness (generalized): Secondary | ICD-10-CM | POA: Diagnosis not present

## 2024-09-29 DIAGNOSIS — G894 Chronic pain syndrome: Secondary | ICD-10-CM | POA: Diagnosis not present

## 2024-09-29 DIAGNOSIS — M62830 Muscle spasm of back: Secondary | ICD-10-CM | POA: Diagnosis not present

## 2024-09-29 DIAGNOSIS — M961 Postlaminectomy syndrome, not elsewhere classified: Secondary | ICD-10-CM | POA: Diagnosis not present

## 2024-09-29 DIAGNOSIS — M47816 Spondylosis without myelopathy or radiculopathy, lumbar region: Secondary | ICD-10-CM | POA: Diagnosis not present

## 2024-10-04 ENCOUNTER — Ambulatory Visit: Admitting: Dermatology

## 2024-10-11 ENCOUNTER — Ambulatory Visit

## 2024-10-11 DIAGNOSIS — M810 Age-related osteoporosis without current pathological fracture: Secondary | ICD-10-CM | POA: Diagnosis not present

## 2024-10-11 DIAGNOSIS — M8580 Other specified disorders of bone density and structure, unspecified site: Secondary | ICD-10-CM | POA: Diagnosis not present

## 2024-10-11 MED ORDER — DENOSUMAB 60 MG/ML ~~LOC~~ SOSY: 60.0000 mg | PREFILLED_SYRINGE | Freq: Once | SUBCUTANEOUS | Status: AC

## 2024-10-11 NOTE — Progress Notes (Signed)
 Patient is in office today for a nurse visit for  Prolia . Patient Injection was given in the  Right arm. Patient tolerated injection well.

## 2024-10-13 DIAGNOSIS — M47816 Spondylosis without myelopathy or radiculopathy, lumbar region: Secondary | ICD-10-CM | POA: Diagnosis not present

## 2024-10-13 DIAGNOSIS — M961 Postlaminectomy syndrome, not elsewhere classified: Secondary | ICD-10-CM | POA: Diagnosis not present

## 2024-10-13 DIAGNOSIS — M6281 Muscle weakness (generalized): Secondary | ICD-10-CM | POA: Diagnosis not present

## 2024-10-13 DIAGNOSIS — M62830 Muscle spasm of back: Secondary | ICD-10-CM | POA: Diagnosis not present

## 2024-10-13 DIAGNOSIS — G894 Chronic pain syndrome: Secondary | ICD-10-CM | POA: Diagnosis not present

## 2024-10-18 DIAGNOSIS — M47816 Spondylosis without myelopathy or radiculopathy, lumbar region: Secondary | ICD-10-CM | POA: Diagnosis not present

## 2024-10-18 DIAGNOSIS — G894 Chronic pain syndrome: Secondary | ICD-10-CM | POA: Diagnosis not present

## 2024-10-18 DIAGNOSIS — M961 Postlaminectomy syndrome, not elsewhere classified: Secondary | ICD-10-CM | POA: Diagnosis not present

## 2024-10-18 DIAGNOSIS — M47812 Spondylosis without myelopathy or radiculopathy, cervical region: Secondary | ICD-10-CM | POA: Diagnosis not present

## 2024-11-15 ENCOUNTER — Ambulatory Visit: Payer: Self-pay

## 2024-11-15 NOTE — Telephone Encounter (Signed)
 Please see triage note for patient. Pt scheduled for visit 11/16/24

## 2024-11-15 NOTE — Telephone Encounter (Signed)
 FYI Only or Action Required?: Action required by provider: Refusing further triage, refusing in-person exam, requesting call back asap for virtual visit with Dr. Kennyth and med.  Patient was last seen in primary care on 07/22/2024 by Kennyth Worth HERO, MD.  Called Nurse Triage reporting Fever, Cough, and Generalized Body Aches.  Symptoms began today.  Interventions attempted: Rest, hydration, or home remedies.  Symptoms are: rapidly worsening.  Triage Disposition: See HCP Within 4 Hours (Or PCP Triage)  Patient/caregiver understands and will follow disposition?: No, wishes to speak with PCP     Copied from CRM #[id number]. Topic: Clinical - Red Word Triage >> Nov 15, 2024  8:03 AM Eva FALCON wrote: Red Word that prompted transfer to Nurse Triage: headache, congestion, body aches, fever, severe cough, requesting medication Reason for Disposition  [1] Fever > 101 F (38.3 C) AND [2] age > 60 years  Answer Assessment - Initial Assessment Questions This RN, given limited information as pt refused further triage, recommended that pt be examined in person in next 4 hours, pt refusing due to transportation, requesting virtual visit with Dr. Kennyth. Please call pt back asap.     Speaking with pt Certain it is the flu Want Dr. Worth to have phone call with me, not in any way shape or form able to jump in vehicle No transportation otherwise, husband recently passed, no neighbors to drive her I gave you all the information, I am feeling like a truck ran over me Didn't feel this way yesterday morning Would like a zoom call, don't want to talk anymore Want to go to sleep, have to take out my hearing aids No SOB or chest pain Just sick sick sick 41 F Don't have medicine Requesting virtual visit with Dr. Kennyth  Protocols used: [facility]

## 2024-11-16 ENCOUNTER — Encounter: Payer: Self-pay | Admitting: Family Medicine

## 2024-11-16 ENCOUNTER — Telehealth: Admitting: Family Medicine

## 2024-11-16 ENCOUNTER — Telehealth: Payer: Self-pay | Admitting: *Deleted

## 2024-11-16 ENCOUNTER — Ambulatory Visit: Admitting: Family

## 2024-11-16 VITALS — Temp 100.0°F | Ht 62.0 in | Wt 110.0 lb

## 2024-11-16 DIAGNOSIS — R6889 Other general symptoms and signs: Secondary | ICD-10-CM | POA: Diagnosis not present

## 2024-11-16 MED ORDER — GUAIFENESIN-CODEINE 100-10 MG/5ML PO SOLN: 5.0000 mL | Freq: Three times a day (TID) | ORAL | 0 refills | Status: DC | PRN

## 2024-11-16 MED ORDER — OSELTAMIVIR PHOSPHATE 75 MG PO CAPS: 75.0000 mg | ORAL_CAPSULE | Freq: Two times a day (BID) | ORAL | 0 refills | Status: DC

## 2024-11-16 MED ORDER — DICLOFENAC SODIUM 75 MG PO TBEC: 75.0000 mg | DELAYED_RELEASE_TABLET | Freq: Two times a day (BID) | ORAL | 0 refills | Status: AC

## 2024-11-16 NOTE — Progress Notes (Signed)
" ° °  Patricia Underwood is a 74 y.o. female who presents today for a virtual office visit.  Assessment/Plan:  Flulike Illness Patient with flulike symptoms since yesterday.  We discussed limitations of virtual visit and inability to perform physical exam.  Given her constellation of symptoms as well as high prevalence in the community would be reasonable for us  to empirically treat with Tamiflu  at this point.  She is agreeable this plan.  Will also manage symptoms with guaifenesin -codeine  cough syrup and diclofenac  as needed for headache and bodyaches.  We encouraged hydration.  She can continue other over-the-counter meds as needed though advised her to not take the diclofenac  with any NSAIDs.  She will let us  know if not improving.  We discussed reasons to return to care.    Subjective:  HPI:  See Assessment / plan for status of chronic conditions.   Discussed the use of AI scribe software for clinical note transcription with the patient, who gave verbal consent to proceed.  History of Present Illness Patricia Underwood is a 74 year old female who presents with flu-like symptoms.  Symptoms began suddenly yesterday, including body aches, headaches, nausea, cough, fever, sore throat, and congestion. The headache is described as very powerful, with watery eyes and significant discomfort. Nausea is present, with a fear of vomiting.  A home flu test was performed and returned positive. She has never had the flu before, although she has had COVID-19 in the past. Currently, she is not taking any specific medication for her symptoms except for Aleve, which she took last night for her headache.  She is concerned about being contagious and mentions having a dog, which complicates her situation. She is trying to stay hydrated with water and juice despite experiencing nausea.         Objective/Observations  Physical Exam: Gen: NAD, resting comfortably Pulm: Normal work of breathing Neuro: Grossly normal, moves  all extremities Psych: Normal affect and thought content  Virtual Visit via Video   I connected with Patricia Underwood on 11/16/2024 at  9:40 AM EST by a video enabled telemedicine application and verified that I am speaking with the correct person using two identifiers. The limitations of evaluation and management by telemedicine and the availability of in person appointments were discussed. The patient expressed understanding and agreed to proceed.   Patient location: Home Provider location: [facility] [employer] Persons participating in the virtual visit: Myself and Patient     Worth HERO. Kennyth, MD 11/16/2024 10:15 AM  "

## 2024-11-16 NOTE — Telephone Encounter (Signed)
 Copied from CRM [id number]. Topic: General - Other >> Nov 16, 2024  9:14 AM Carlyon D wrote: Reason for CRM: Pt is calling in regards to her video visit states everytime she has one the dr can see her but can't hear her she is wanting to just make note of this in case it happens and some one in office can call her ot walk her through the audio if fails   Patient on waiting room to see PCP on VV  [facility]

## 2024-12-12 ENCOUNTER — Telehealth: Admitting: Family Medicine

## 2024-12-12 ENCOUNTER — Telehealth: Payer: Self-pay | Admitting: *Deleted

## 2024-12-12 ENCOUNTER — Ambulatory Visit: Payer: Self-pay

## 2024-12-12 VITALS — Ht 62.0 in | Wt 110.0 lb

## 2024-12-12 DIAGNOSIS — G8929 Other chronic pain: Secondary | ICD-10-CM | POA: Diagnosis not present

## 2024-12-12 DIAGNOSIS — M5441 Lumbago with sciatica, right side: Secondary | ICD-10-CM | POA: Diagnosis not present

## 2024-12-12 MED ORDER — TIZANIDINE HCL 4 MG PO TABS: 4.0000 mg | ORAL_TABLET | Freq: Four times a day (QID) | ORAL | 0 refills | Status: AC | PRN

## 2024-12-12 MED ORDER — PREDNISONE 20 MG PO TABS: 40.0000 mg | ORAL_TABLET | Freq: Every day | ORAL | 0 refills | Status: AC

## 2024-12-12 NOTE — Telephone Encounter (Signed)
 FYI Only or Action Required?: FYI only for provider: appointment scheduled on 2/2.  Patient was last seen in primary care on 11/16/2024 by Kennyth Worth HERO, MD.  Called Nurse Triage reporting Back Pain.  Symptoms began a week ago.  Interventions attempted: Rest, hydration, or home remedies.  Symptoms are: stable.  Triage Disposition: See PCP When Office is Open (Within 3 Days)  Patient/caregiver understands and will follow disposition?: Yes   Reason for Triage: Patient having burning back pain - wants muscles relaxer   Reason for Disposition  [1] MODERATE back pain (e.g., interferes with normal activities) AND [2] present > 3 days  Answer Assessment - Initial Assessment Questions Requesting muscle relaxer.   1. ONSET: When did the pain begin? (e.g., minutes, hours, days)     A week ago 2. LOCATION: Where does it hurt? (upper, mid or lower back)     Lower back 3. SEVERITY: How bad is the pain?  (e.g., Scale 1-10; mild, moderate, or severe)     moderate 4. PATTERN: Is the pain constant? (e.g., yes, no; constant, intermittent)      yes 5. RADIATION: Does the pain shoot into your legs or somewhere else?     no 6. CAUSE:  What do you think is causing the back pain?      Slid on ice but did not fall  8. MEDICINES: What have you taken so far for the pain? (e.g., nothing, acetaminophen , NSAIDS)      9. NEUROLOGIC SYMPTOMS: Do you have any weakness, numbness, or problems with bowel/bladder control?     denies  Protocols used: Back Pain-A-AH

## 2024-12-12 NOTE — Progress Notes (Signed)
" ° °  Patricia Underwood is a 74 y.o. female who presents today for a virtual office visit.  Assessment/Plan:   Chronic Problems Addressed Today: Chronic low back pain with sciatica Patient with flare up of her chronic low back pain with sciatica due to recent slip on ice. We discussed limitations of virtual visit and inability to perform physical exam. She is sure that she did not sustain any fractures. She percocet that is being managed by her pain specialists. We will start Zanaflex  as she has done well with this previously and also give a pocket prescription for a prednisone  burst for the next few days if not improving. She will need to come in for in person visit if not improving with this.      Subjective:  HPI:  See Assessment / plan for status of chronic conditions. She is here today with chief complaint of pain. A week ago she slid on ice while taking her dog out a week ago. She has been having back pain since then. She has been using OTC medications without much improvement. She has a history of chronic back pain and has been seeing pain management and is on percocet daily. For previous flare she has has Zanaflex  and done well with this. She does not think that she broke any bones. Pain worse with movement.         Objective/Observations  Physical Exam: Gen: NAD, resting comfortably Pulm: Normal work of breathing Neuro: Grossly normal, moves all extremities Psych: Normal affect and thought content  Virtual Visit via Video   I connected with Patricia Underwood on 12/12/24 at 10:00 AM EST by a video enabled telemedicine application and verified that I am speaking with the correct person using two identifiers. The limitations of evaluation and management by telemedicine and the availability of in person appointments were discussed. The patient expressed understanding and agreed to proceed.   Patient location: Home Provider location: Home Office Persons participating in the virtual visit: Myself  and Patient     Worth HERO. Kennyth, MD 12/12/2024 10:13 AM  "

## 2024-12-12 NOTE — Assessment & Plan Note (Signed)
 Patient with flare up of her chronic low back pain with sciatica due to recent slip on ice. We discussed limitations of virtual visit and inability to perform physical exam. She is sure that she did not sustain any fractures. She percocet that is being managed by her pain specialists. We will start Zanaflex  as she has done well with this previously and also give a pocket prescription for a prednisone  burst for the next few days if not improving. She will need to come in for in person visit if not improving with this.

## 2024-12-13 ENCOUNTER — Telehealth: Payer: Self-pay | Admitting: *Deleted

## 2024-12-13 MED ORDER — METAXALONE 800 MG PO TABS: 800.0000 mg | ORAL_TABLET | Freq: Three times a day (TID) | ORAL | 0 refills | Status: AC | PRN

## 2024-12-13 NOTE — Telephone Encounter (Signed)
 Copied from CRM [id number]. Topic: Clinical - Medication Question >> Dec 12, 2024 10:38 AM Adelita E wrote: Reason for CRM: Patient was prescribed tiZANidine  (ZANAFLEX ) 4 MG tablet today at her virtual visit, but she read that drowsiness is a side effect. Patient questioning if PCP could prescribe metaxalone  instead. Patient's friend will be picking up medication around noon, can this please get sent in by then, if doable. >> Dec 13, 2024  8:25 AM Macario HERO wrote: Patient called regarding the status of her medication request   Please advise  [facility]
